# Patient Record
Sex: Male | Born: 1985 | Race: White | Hispanic: No | Marital: Single | Smoking: Never smoker
Health system: Southern US, Community
[De-identification: ages and names within clinical notes are randomized; demographics above are authoritative.]

## PROBLEM LIST (undated history)

## (undated) DIAGNOSIS — H3552 Pigmentary retinal dystrophy: Secondary | ICD-10-CM

## (undated) DIAGNOSIS — E063 Autoimmune thyroiditis: Secondary | ICD-10-CM

## (undated) DIAGNOSIS — Q897 Multiple congenital malformations, not elsewhere classified: Secondary | ICD-10-CM

## (undated) DIAGNOSIS — E039 Hypothyroidism, unspecified: Secondary | ICD-10-CM

## (undated) DIAGNOSIS — E049 Nontoxic goiter, unspecified: Secondary | ICD-10-CM

## (undated) DIAGNOSIS — E1121 Type 2 diabetes mellitus with diabetic nephropathy: Secondary | ICD-10-CM

## (undated) DIAGNOSIS — H543 Unqualified visual loss, both eyes: Secondary | ICD-10-CM

## (undated) DIAGNOSIS — R4189 Other symptoms and signs involving cognitive functions and awareness: Secondary | ICD-10-CM

## (undated) DIAGNOSIS — E11649 Type 2 diabetes mellitus with hypoglycemia without coma: Secondary | ICD-10-CM

## (undated) DIAGNOSIS — I10 Essential (primary) hypertension: Secondary | ICD-10-CM

## (undated) DIAGNOSIS — F79 Unspecified intellectual disabilities: Secondary | ICD-10-CM

## (undated) DIAGNOSIS — E1151 Type 2 diabetes mellitus with diabetic peripheral angiopathy without gangrene: Secondary | ICD-10-CM

## (undated) DIAGNOSIS — E1142 Type 2 diabetes mellitus with diabetic polyneuropathy: Secondary | ICD-10-CM

## (undated) DIAGNOSIS — I509 Heart failure, unspecified: Secondary | ICD-10-CM

## (undated) HISTORY — DX: Autoimmune thyroiditis: E06.3

## (undated) HISTORY — DX: Nontoxic goiter, unspecified: E04.9

## (undated) HISTORY — DX: Multiple congenital malformations, not elsewhere classified: Q89.7

## (undated) HISTORY — DX: Type 2 diabetes mellitus with diabetic peripheral angiopathy without gangrene: E11.51

## (undated) HISTORY — DX: Essential (primary) hypertension: I10

## (undated) HISTORY — DX: Unqualified visual loss, both eyes: H54.3

## (undated) HISTORY — DX: Type 2 diabetes mellitus with hypoglycemia without coma: E11.649

## (undated) HISTORY — DX: Type 2 diabetes mellitus with diabetic polyneuropathy: E11.42

## (undated) HISTORY — DX: Unspecified intellectual disabilities: F79

## (undated) HISTORY — PX: DENTAL SURGERY: SHX609

## (undated) NOTE — *Deleted (*Deleted)
Inpatient Diabetes Program Recommendations  AACE/ADA: New Consensus Statement on Inpatient Glycemic Control (2015)  Target Ranges:  Prepandial:   less than 140 mg/dL      Peak postprandial:   less than 180 mg/dL (1-2 hours)      Critically ill patients:  140 - 180 mg/dL   Lab Results  Component Value Date   GLUCAP 328 (H) 04/03/2020   HGBA1C 9.3 (H) 09/10/2019    Review of Glycemic Control  Results for [name], [name] (MRN [id number]) as of 04/03/2020 13:48  Ref. Range 04/03/2020 13:21  Glucose-Capillary Latest Ref Range: 70 - 99 mg/dL 956 (H)   Diabetes history: *** Outpatient Diabetes medications: *** Current orders for Inpatient glycemic control: ***  Inpatient Diabetes Program Recommendations:

---
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
## 2007-02-17 ENCOUNTER — Inpatient Hospital Stay (HOSPITAL_COMMUNITY): Admission: EM | Admit: 2007-02-17 | Discharge: 2007-02-19 | Payer: Self-pay | Admitting: Emergency Medicine

## 2007-02-17 ENCOUNTER — Ambulatory Visit: Payer: Self-pay | Admitting: Pediatrics

## 2007-02-17 ENCOUNTER — Ambulatory Visit: Payer: Self-pay | Admitting: Family Medicine

## 2007-03-29 ENCOUNTER — Ambulatory Visit: Payer: Self-pay | Admitting: "Endocrinology

## 2008-01-02 ENCOUNTER — Emergency Department (HOSPITAL_COMMUNITY): Admission: EM | Admit: 2008-01-02 | Discharge: 2008-01-02 | Payer: Self-pay | Admitting: Emergency Medicine

## 2008-02-05 ENCOUNTER — Ambulatory Visit: Payer: Self-pay | Admitting: "Endocrinology

## 2008-03-27 ENCOUNTER — Emergency Department (HOSPITAL_COMMUNITY): Admission: EM | Admit: 2008-03-27 | Discharge: 2008-03-27 | Payer: Self-pay | Admitting: Family Medicine

## 2008-06-11 ENCOUNTER — Ambulatory Visit: Payer: Self-pay | Admitting: "Endocrinology

## 2008-09-24 ENCOUNTER — Ambulatory Visit: Payer: Self-pay | Admitting: "Endocrinology

## 2009-07-28 ENCOUNTER — Emergency Department (HOSPITAL_COMMUNITY): Admission: EM | Admit: 2009-07-28 | Discharge: 2009-07-28 | Payer: Self-pay | Admitting: Emergency Medicine

## 2009-11-29 ENCOUNTER — Inpatient Hospital Stay (HOSPITAL_COMMUNITY): Admission: EM | Admit: 2009-11-29 | Discharge: 2009-12-01 | Payer: Self-pay | Admitting: Emergency Medicine

## 2009-12-04 ENCOUNTER — Ambulatory Visit: Payer: Self-pay | Admitting: "Endocrinology

## 2010-02-09 ENCOUNTER — Ambulatory Visit: Payer: Self-pay | Admitting: "Endocrinology

## 2010-02-10 ENCOUNTER — Emergency Department (HOSPITAL_COMMUNITY): Admission: EM | Admit: 2010-02-10 | Discharge: 2010-02-10 | Payer: Self-pay | Admitting: Emergency Medicine

## 2010-03-23 ENCOUNTER — Ambulatory Visit: Payer: Self-pay | Admitting: "Endocrinology

## 2010-05-26 ENCOUNTER — Ambulatory Visit
Admission: RE | Admit: 2010-05-26 | Discharge: 2010-05-26 | Payer: Self-pay | Source: Home / Self Care | Attending: "Endocrinology | Admitting: "Endocrinology

## 2010-08-06 LAB — POCT I-STAT, CHEM 8
BUN: 15 mg/dL (ref 6–23)
Calcium, Ion: 1.11 mmol/L — ABNORMAL LOW (ref 1.12–1.32)
Chloride: 101 mEq/L (ref 96–112)
Glucose, Bld: 200 mg/dL — ABNORMAL HIGH (ref 70–99)
HCT: 49 % (ref 39.0–52.0)
Hemoglobin: 16.7 g/dL (ref 13.0–17.0)
Potassium: 3.7 mEq/L (ref 3.5–5.1)
Sodium: 137 mEq/L (ref 135–145)
TCO2: 29 mmol/L (ref 0–100)

## 2010-08-06 LAB — GLUCOSE, CAPILLARY: Glucose-Capillary: 120 mg/dL — ABNORMAL HIGH (ref 70–99)

## 2010-08-09 LAB — TSH: TSH: 1.875 u[IU]/mL (ref 0.350–4.500)

## 2010-08-09 LAB — BASIC METABOLIC PANEL
BUN: 16 mg/dL (ref 6–23)
BUN: 5 mg/dL — ABNORMAL LOW (ref 6–23)
BUN: 6 mg/dL (ref 6–23)
CO2: 15 mEq/L — ABNORMAL LOW (ref 19–32)
CO2: 26 mEq/L (ref 19–32)
Calcium: 9.1 mg/dL (ref 8.4–10.5)
Chloride: 100 mEq/L (ref 96–112)
Chloride: 107 mEq/L (ref 96–112)
Chloride: 111 mEq/L (ref 96–112)
Creatinine, Ser: 0.68 mg/dL (ref 0.4–1.5)
Creatinine, Ser: 1.32 mg/dL (ref 0.4–1.5)
GFR calc Af Amer: >60 mL/min (ref >60)
GFR calc Af Amer: >60 mL/min (ref >60)
GFR calc non Af Amer: >60 mL/min (ref >60)
GFR calc non Af Amer: >60 mL/min (ref >60)
Glucose, Bld: 150 mg/dL — ABNORMAL HIGH (ref 70–99)
Glucose, Bld: 540 mg/dL — ABNORMAL HIGH (ref 70–99)
Glucose, Bld: 95 mg/dL (ref 70–99)
Potassium: 3 mEq/L — ABNORMAL LOW (ref 3.5–5.1)
Potassium: 3.4 mEq/L — ABNORMAL LOW (ref 3.5–5.1)
Potassium: 4.8 mEq/L (ref 3.5–5.1)
Potassium: 5 mEq/L (ref 3.5–5.1)
Sodium: 135 mEq/L (ref 135–145)
Sodium: 138 mEq/L (ref 135–145)

## 2010-08-09 LAB — GLUCOSE, CAPILLARY
Glucose-Capillary: 113 mg/dL — ABNORMAL HIGH (ref 70–99)
Glucose-Capillary: 122 mg/dL — ABNORMAL HIGH (ref 70–99)
Glucose-Capillary: 137 mg/dL — ABNORMAL HIGH (ref 70–99)
Glucose-Capillary: 149 mg/dL — ABNORMAL HIGH (ref 70–99)
Glucose-Capillary: 151 mg/dL — ABNORMAL HIGH (ref 70–99)
Glucose-Capillary: 151 mg/dL — ABNORMAL HIGH (ref 70–99)
Glucose-Capillary: 163 mg/dL — ABNORMAL HIGH (ref 70–99)
Glucose-Capillary: 170 mg/dL — ABNORMAL HIGH (ref 70–99)
Glucose-Capillary: 175 mg/dL — ABNORMAL HIGH (ref 70–99)
Glucose-Capillary: 175 mg/dL — ABNORMAL HIGH (ref 70–99)
Glucose-Capillary: 180 mg/dL — ABNORMAL HIGH (ref 70–99)
Glucose-Capillary: 180 mg/dL — ABNORMAL HIGH (ref 70–99)
Glucose-Capillary: 197 mg/dL — ABNORMAL HIGH (ref 70–99)
Glucose-Capillary: 204 mg/dL — ABNORMAL HIGH (ref 70–99)
Glucose-Capillary: 219 mg/dL — ABNORMAL HIGH (ref 70–99)
Glucose-Capillary: 220 mg/dL — ABNORMAL HIGH (ref 70–99)
Glucose-Capillary: 232 mg/dL — ABNORMAL HIGH (ref 70–99)
Glucose-Capillary: 238 mg/dL — ABNORMAL HIGH (ref 70–99)
Glucose-Capillary: 245 mg/dL — ABNORMAL HIGH (ref 70–99)
Glucose-Capillary: 392 mg/dL — ABNORMAL HIGH (ref 70–99)
Glucose-Capillary: 534 mg/dL — ABNORMAL HIGH (ref 70–99)
Glucose-Capillary: 92 mg/dL (ref 70–99)

## 2010-08-09 LAB — URINALYSIS, ROUTINE W REFLEX MICROSCOPIC
Bilirubin Urine: NEGATIVE
Glucose, UA: >1000 mg/dL — AB
Hgb urine dipstick: NEGATIVE
Ketones, ur: >80 mg/dL — AB
Leukocytes, UA: NEGATIVE
Nitrite: NEGATIVE
Protein, ur: NEGATIVE mg/dL
Specific Gravity, Urine: 1.031 — ABNORMAL HIGH (ref 1.005–1.030)
Urobilinogen, UA: 0.2 mg/dL (ref 0.0–1.0)
pH: 5 (ref 5.0–8.0)

## 2010-08-09 LAB — HEPATIC FUNCTION PANEL
ALT: 11 U/L (ref 0–53)
AST: 11 U/L (ref 0–37)
Albumin: 3.4 g/dL — ABNORMAL LOW (ref 3.5–5.2)
Alkaline Phosphatase: 77 U/L (ref 39–117)
Bilirubin, Direct: <0.1 mg/dL (ref 0.0–0.3)
Total Bilirubin: 0.9 mg/dL (ref 0.3–1.2)
Total Protein: 5.9 g/dL — ABNORMAL LOW (ref 6.0–8.3)

## 2010-08-09 LAB — PHOSPHORUS: Phosphorus: 2.3 mg/dL (ref 2.3–4.6)

## 2010-08-09 LAB — LIPASE, BLOOD: Lipase: 24 U/L (ref 11–59)

## 2010-08-09 LAB — HEMOGLOBIN A1C
Hgb A1c MFr Bld: 13.5 % — ABNORMAL HIGH (ref <5.7)
Mean Plasma Glucose: 341 mg/dL — ABNORMAL HIGH (ref <117)

## 2010-08-09 LAB — POCT I-STAT, CHEM 8
BUN: 17 mg/dL (ref 6–23)
Calcium, Ion: 1.18 mmol/L (ref 1.12–1.32)
Chloride: 105 mEq/L (ref 96–112)
Creatinine, Ser: 0.8 mg/dL (ref 0.4–1.5)
Glucose, Bld: 537 mg/dL — ABNORMAL HIGH (ref 70–99)
HCT: 52 % (ref 39.0–52.0)
Hemoglobin: 17.7 g/dL — ABNORMAL HIGH (ref 13.0–17.0)
Potassium: 5 mEq/L (ref 3.5–5.1)
Sodium: 134 mEq/L — ABNORMAL LOW (ref 135–145)
TCO2: 14 mmol/L (ref 0–100)

## 2010-08-09 LAB — CBC
HCT: 41.3 % (ref 39.0–52.0)
HCT: 46.2 % (ref 39.0–52.0)
Hemoglobin: 15.7 g/dL (ref 13.0–17.0)
MCH: 33.3 pg (ref 26.0–34.0)
MCHC: 34 g/dL (ref 30.0–36.0)
MCHC: 34.6 g/dL (ref 30.0–36.0)
MCV: 97.8 fL (ref 78.0–100.0)
Platelets: 178 10*3/uL (ref 150–400)
Platelets: 203 10*3/uL (ref 150–400)
RBC: 4.73 MIL/uL (ref 4.22–5.81)
RDW: 12.5 % (ref 11.5–15.5)
RDW: 12.6 % (ref 11.5–15.5)
WBC: 7.6 10*3/uL (ref 4.0–10.5)

## 2010-08-09 LAB — DIFFERENTIAL
Basophils Absolute: 0 10*3/uL (ref 0.0–0.1)
Basophils Relative: 0 % (ref 0–1)
Eosinophils Absolute: 0 10*3/uL (ref 0.0–0.7)
Eosinophils Relative: 0 % (ref 0–5)
Lymphocytes Relative: 22 % (ref 12–46)
Lymphs Abs: 1.6 10*3/uL (ref 0.7–4.0)
Monocytes Absolute: 0.3 10*3/uL (ref 0.1–1.0)
Monocytes Relative: 4 % (ref 3–12)
Neutro Abs: 5.5 10*3/uL (ref 1.7–7.7)
Neutrophils Relative %: 73 % (ref 43–77)

## 2010-08-09 LAB — URINE MICROSCOPIC-ADD ON: Urine-Other: NONE SEEN

## 2010-08-16 LAB — URINALYSIS, ROUTINE W REFLEX MICROSCOPIC
Ketones, ur: >80 mg/dL — AB
Leukocytes, UA: NEGATIVE
Nitrite: NEGATIVE
Specific Gravity, Urine: 1.041 — ABNORMAL HIGH (ref 1.005–1.030)
pH: 5.5 (ref 5.0–8.0)

## 2010-08-16 LAB — CBC
HCT: 45.6 % (ref 39.0–52.0)
MCHC: 34.8 g/dL (ref 30.0–36.0)
MCV: 96 fL (ref 78.0–100.0)
Platelets: 191 10*3/uL (ref 150–400)
RBC: 4.75 MIL/uL (ref 4.22–5.81)
WBC: 7.6 10*3/uL (ref 4.0–10.5)

## 2010-08-16 LAB — DIFFERENTIAL
Basophils Relative: 0 % (ref 0–1)
Eosinophils Absolute: 0 10*3/uL (ref 0.0–0.7)
Eosinophils Relative: 0 % (ref 0–5)
Lymphs Abs: 1.3 10*3/uL (ref 0.7–4.0)
Monocytes Relative: 7 % (ref 3–12)
Neutrophils Relative %: 75 % (ref 43–77)

## 2010-08-16 LAB — BASIC METABOLIC PANEL
Calcium: 9.1 mg/dL (ref 8.4–10.5)
GFR calc Af Amer: >60 mL/min (ref >60)
Glucose, Bld: 484 mg/dL — ABNORMAL HIGH (ref 70–99)
Sodium: 134 mEq/L — ABNORMAL LOW (ref 135–145)

## 2010-08-27 ENCOUNTER — Ambulatory Visit: Payer: Self-pay | Admitting: "Endocrinology

## 2010-10-06 NOTE — H&P (Signed)
NAMEJARRID, [name] NO.:  [id number]   MEDICAL RECORD NO.:  [id number]          PATIENT TYPE:  INP   LOCATION:  6157                         FACILITY:  [facility]   PHYSICIAN:  [name], M.D.DATE OF BIRTH:  03-05-1986   DATE OF ADMISSION:  02/17/2007  DATE OF DISCHARGE:                              HISTORY & PHYSICAL   PRIMARY CARE PHYSICIAN:  [facility] [place].   ENDOCRINOLOGIST:  Dr. Fransico [name].   CHIEF COMPLAINT:  Disoriented.   HISTORY OF PRESENT ILLNESS:  The patient is an 25 year old male with a  history of type 1 diabetes mellitus and hypothyroidism who presents from  home where he was confused and acting crazy.  Grandmother states that  he was not very responsive, told mom that he felt like he was going to  pass out, and he had slurred speech.  She considered calling 9-1-1, but  he improved and became more responsive without any intervention.  He was  searching for his morning insulin, which is strange because it was  sitting right in front of him, which went along with his increased  confusion.  He complained of abdominal pain and headache this morning  but was fine the night before and is fine currently in the emergency  department after receiving a liter of fluids.  He checked his sugar, and  it is in the 240s, and his urine was positive for ketones which he  checks regularly.  States he sometimes forgets to give himself insulin  and forgot last night.  He has felt weak but no nausea or vomiting,  fevers, chills, or sweats.  He denies current complaints.  His mouth is  not dry.  He is making tears, and he feels back to his baseline.  He was  given 1 liter of normal saline in the emergency department.   PAST MEDICAL HISTORY:  1. Type 1 diabetes mellitus.  2. Hypothyroidism.  3. Retinitis pigmentosa.   MEDICATIONS:  1. NovoLog 70/30, 26 units q.a.m. and 16 units nightly.  2. Humalog sliding-scale insulin before meals or food and nightly,  but      he does not do regular meal coverage or carbohydrate counting.  3. Synthroid.  Will bring the dosage of this medication later.   ALLERGIES:  No known drug allergies.   SOCIAL HISTORY:  Lives with his mom, dad, 7 year old sister.  He  graduates here from [employer].  No tobacco, alcohol, illicit  drugs.  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   FAMILY HISTORY:  Dad has rhinitis pigmentosa. Mom is living and healthy.  Sister is also living and healthy.   REVIEW OF SYSTEMS:  Denies chest pain, dyspnea, palpitations, dysuria,  hematuria, diarrhea.  Otherwise normal and per HPI.   PHYSICAL EXAMINATION:  VITAL SIGNS:  Temperature 97.1, pulse 100, blood  pressure 125/80, respirations 22, pulse oximetry 96% on room air.  GENERAL:  No acute distress, cooperative.  HEENT:  Atraumatic, extraocular movements intact.  Pupils equal, round  and reactive to light and making tears.  Tympanic membranes glossy, no  erythema.  No nasal discharge.  Pharynx:  Mucous membranes are moist. No  erythema or exudate.  NECK:  No lymphadenopathy, thyromegaly or masses.  CARDIOVASCULAR:  Regular rate and rhythm.  No murmurs, rubs or gallops.  LUNGS:  Clear to auscultation bilaterally.  No wheezes, rales or  rhonchi.  ABDOMEN:  Soft, nontender, nondistended.  No hepatosplenomegaly.  Bowel  sounds positive.  EXTREMITIES:  No edema.  Warm and well perfused.  NEUROLOGIC:  Alert and oriented x3.  Strength is 5/5 all extremities.  Muscle strength and reflexes are 3+ bilaterally in patellae and biceps.  Sensation is intact to light touch bilaterally.  Cranial nerves II-XII  grossly intact with the exception of his vision decrease.   LABORATORY DATA AND STUDIES:  Sodium 131, potassium 4.5, chloride 106,  bicarb 12.4, BUN 10, creatinine 0.7, glucose 253 with an anion gap of  13.  Urinalysis:  Specific gravity 1.042, greater than 1000 glucose,  greater than 80 ketones, moderate  bilirubin and 30 protein.   ASSESSMENT:  This is an 25 year old male with  1. Diabetic ketoacidosis.  His bicarb is 12 with multiple missed      insulin doses over the past week.  Discussed with Dr. Fransico [name], who      believes this is gastroenteritis with his abdominal pain and      nonadherence the likely cause even though he has relatively low      CBGs.  Will start D5 normal saline as glucose is less than 250.      Will give insulin for every 50 of a CBG above 200.  Would check      basic metabolic panels every 4 hours.  Will be n.p.o. until his      bicarb is greater than 20.  Will check CBGs q. 1 h with sliding      scale insulin as noted previously.  If no improvement in bicarb,      will broaden workup to include other etiologies of metabolic      acidosis.  2. Hypothyroidism.  Will have him check on the dose of Synthroid      [name] is on, and we will start this here.  3. Fluid, electrolytes, nutrition, gastrointestinal.  The patient will      be n.p.o.  We will monitor his potassium and other electrolytes.      Will continue D5 normal saline at 1-1/2 times maintenance.      [name], M.D.  Electronically Signed      [name], M.D.  Electronically Signed    SH/MEDQ  D:  02/17/2007  T:  02/18/2007  Job:  [id number]

## 2010-10-09 NOTE — Discharge Summary (Signed)
NAMECEDERIC, [name] NO.:  [id number]   MEDICAL RECORD NO.:  [id number]          PATIENT TYPE:  INP   LOCATION:  6153                         FACILITY:  [facility]   PHYSICIAN:  Wilhemina Bonito, M.D.     DATE OF BIRTH:  02-May-1986   DATE OF ADMISSION:  02/17/2007  DATE OF DISCHARGE:  02/19/2007                               DISCHARGE SUMMARY   DISCHARGE DIAGNOSES:  1. Moderate diabetic ketoacidosis.  2. Type 1 diabetic.  3. Hypothyroidism.  4. Retinosis pigmentosa.  5. Syndromic facies.   MEDICATIONS AT DISCHARGE:  1. Novolog 70/30 26 units in a.m., 16 units at night.  2. Humalog sliding scale insulin before meals; at breakfast, lunch and      dinner per Dr. Juluis Mire recommendations.  3. Synthroid 25 mcg p.o. daily.   FOLLOW UP:  The patient is to follow-up with Dr. Fransico Michael at his next  available appointment.  The patient to follow-up with the [facility], his PCP, by the end of this week on Friday to follow up for  improvement of diabetic blood glucose control.   DISCHARGE LABS:  Include urine ketones at 15.  Sodium 136, potassium  3.9, chloride 106, bicarb 25, glucose 260, BUN 4, creatinine 0.63,  calcium 8.4.  CBC:  White count 3.6, hemoglobin 14.4, hematocrit 41.4,  platelet count 171.  Hemoglobin A1c showed 11.7.   BRIEF HOSPITAL COURSE:  The patient is an 25 year old white male who is  followed by [facility], who was seen by Dr. Fransico Michael.  He was  admitted on January 17, 2007 for confusion, acting crazy and moderate  DKA.  Please see the dictated HPI dated on February 17, 2007.   The patient was admitted to the [facility]. and  was started per Dr. Juluis Mire recommendations on a sliding scale q.3 h.  with insulin, as well as IV fluid hydration.  The patient had a  deterioration in his bicarb and noticed to be in DKA; hence he was then  transferred over to the PICU.  Two-bag method was performed using normal  saline  with D10 and insulin drip, as well as normal saline with  potassium acetate.  The patient had resolution of his DKA on February 18, 2007, and was then transferred out of the PICU.  The patient was  transitioned over to his Novolog 70/30 home regimen, as well as his  sliding scale on the night of September 27;  and continued on the  September 28.  The patient had total resolution of his symptoms and no  nausea, no vomiting; tolerating p.o. and agreeable to go home.  The  patient will follow-up with [facility] as previously  scheduled, and will follow up with Dr. Fransico Michael as appointment is  available.  Sliding-scale regimen was provided for the patient per Dr.  Juluis Mire office.  Prescriptions were made for the patient for Novolog  70/30, as well as his Humalog sliding scale.      Wilhemina Bonito, M.D.  Electronically Signed     ML/MEDQ  D:  02/21/2007  T:  02/21/2007  Job:  [id number] 

## 2010-10-09 NOTE — H&P (Signed)
[name], [name] NO.:  [id number]   MEDICAL RECORD NO.:  [id number]          PATIENT TYPE:  INP   LOCATION:  6120                         FACILITY:  [facility]   PHYSICIAN:  Melina Fiddler, MD DATE OF BIRTH:  [birth year]   DATE OF ADMISSION:  10/02/2005  DATE OF DISCHARGE:                                HISTORY & PHYSICAL   CHIEF COMPLAINT:  Elevated blood glucose.   HISTORY OF PRESENT ILLNESS:  Mr. Herrman is a 25 year old male with history  of insulin-dependent diabetes mellitus since the age of 9 who had the onset  of nausea before lunch today.  No vomiting, no fever or chills.  He did have  associated throat pain.  CBG at the onset of nausea was 412.  Mother gave  the patient 15 units of old Humalog in the refrigerator, 15 to 20 minutes  later CBG was 377.  [facility]  CBG  at the facility was greater than 450.  Vitals were normal and stable.  Rapid  strep negative.  The patient was sent for direct admit for elevation of CBGs  and for further evaluation.   The patient was started on insulin (Humalog) pump a month ago per Dr. Reed Breech  at [facility].  The patient as well as mother admits that the patient  does not regularly check his CBGs.  He last saw Dr. Reed Breech in March of 2007  who had started seeing after leaving Dr. Tinnie Gens A. Todd's office at  [employer].  He was referred to [facility] about a year ago to see an  endocrinologist who they divorced.   REVIEW OF SYSTEMS:  No sick contacts.  GI negative.  Urologic:  Negative.  No chest pain and no shortness of breath.  No weight loss.  No upper  respiratory symptoms.   PAST MEDICAL HISTORY:  Insulin-dependent diabetes mellitus, diagnosed five  years ago.   MEDICATIONS:  Humalog insulin pop.  The patient nor mother know outpatient  orders (will bring for team to review).   ALLERGIES:  No known drug allergies.   FAMILY HISTORY:  Mother chronic  urinary tract infection.  Father described  as healthy as well as sister described as healthy.  Paternal grandmother  adult onset diabetes mellitus.  Maternal grandmother adult onset diabetes  mellitus.  Second cousin with juvenile diabetes.   SOCIAL HISTORY:  The patient lives with parents and 59 year old sister.  He  is in the 10th grade at [employer].  Denies tobacco abuse  as well as illicit drug use.  No alcohol use.  The patient denies sexual  activity.   PHYSICAL EXAMINATION:  HEENT:  Normocephalic and atraumatic.  PERRL.  EOMI.  No adenopathy.  No thyromegaly.  NECK:  Supple.  Oropharynx moist without lesions.  No pharyngeal edema or  erythema.  CARDIOVASCULAR:  Regular rate and rhythm.  No murmurs, gallops, or rubs.  LUNGS:  Clear to auscultation bilaterally.  ABDOMEN:  Soft, nontender, and nondistended.  Positive bowel sounds.  No  hepatosplenomegaly.  No masses.  EXTREMITIES:  No lower extremity edema.  Full range of motion.  No joint  swelling or erythema.  NEUROLOGICAL:  Cranial nerves II through XII intact.  SKIN:  Facial acne.   LABORATORY DATA:  At [place], WBC 5.9, hemoglobin 15.9, hematocrit 47.4,  platelets 182,000.  MCV 92.8.  Rapid strep negative.  Hemoglobin A1c 10.8 in  March of 2007.   ASSESSMENT/PLAN:  Insulin-dependent diabetes mellitus, poorly controlled.  No evidence of below knee amputation.  Presently, for clinical exam;  however, we will order electrolytes to assess for increased anion gap.  For  now, we will treat aggressively with IV fluids.  Hold insulin regimen for  now.  We will rule out source of infection as to the cause of elevated CBGs  with urinalysis and with repeat CBC.  The patient will likely need pediatric  endocrinologist referral to establish some consistency with his diabetes  management as well as to discuss a regimen that he is willing to be more  compliant with.      Morley Kos, M.D.     ______________________________  Melina Fiddler, MD    VRE/MEDQ  D:  10/03/2005  T:  10/03/2005  Job:  [id number]   cc:   Kinnie Scales. Reed Breech, M.D.  Fax: [phone]

## 2010-10-09 NOTE — Discharge Summary (Signed)
[place]  Patient:    [name], [name] Visit Number: [id number] MRN: [id number]          Service Type: PED Location: PEDS [phone] Attending Physician:  Evette Georges D. Dictated by:   Mont Dutton, M.D. Admit Date:  04/06/2001 Discharge Date: 04/11/2001   CC:         Mont Dutton, M.D.  Dr. Tawanna Cooler, Pediatrician.   Discharge Summary  ADMISSION DIAGNOSIS:  New onset diabetes.  DISCHARGE DIAGNOSIS:  New onset diabetes.  ATTENDING PHYSICIAN:  Dr. Evette Georges.  CONSULTATIONS:  Diabetic educator.  PROCEDURES:  None.  ADMISSION HISTORY:  The patient is a 25 year old white male with no significant past medical history who presented with new onset diabetes per primary doctor.  The patient had had a history of wetting his bed more frequently as well as getting up to urinate more times throughout the night. Also the patient had increased somnolence and falling asleep on the school bus.  At his primary physicians office his blood sugar was noted to be greater than 500 and he was sent to [facility].  ALLERGIES: No allergies.  MEDICATIONS:  No medications.  PAST MEDICAL HISTORY:  He was full-term complicated by a 14 day hospital stay for jaundice, however, no treatment requirement and no other problems.  No other hospitalizations or emergency room visits.  He has worn glasses since age 67 for retinitis pigmentosum.  He has had PE tubes bilaterally.  FAMILY HISTORY:  Diabetes in multiple family members including paternal grandmother, paternal grandfather, maternal great-grandmother, multiple cousins.  REVIEW OF SYSTEMS:  Significant for enuresis, increased somnolence.  PHYSICAL EXAMINATION:  VITAL SIGNS:  On admission physical the vital signs were noted to be within normal limits.  ABDOMEN:  Nontender.  LABORATORY:  Blood glucose was 459.  VBG had a pH 7.357, CO2 39.8, arterial O2 support is 3.1, bicarb  21.8, 80.9% saturation on room air.  CBC with white blood cell count 6.7, hemoglobin 14.4, hematocrit 41.6, platelet count 310K. Neutrophils 63%, 29% lymphs.  BMP sodium 136, potassium 4.0, chloride 100, bicarb 20, BUN 11, creatinine 0.7 with blood glucose 421. Calcium 9.1, magnesium 2.2, phosphorus 3.4.  Serum acetone: Small.  UA SG 1.040, glucose greater than 1000, ketones greater than 80, otherwise within normal limits.  HOSPITAL COURSE:  The patient was admitted for new onset diabetes mellitus not currently in diabetic ketoacidosis.  The patient was placed on normal saline bolus, 10 per kg times one, obtained C-peptide autocell antibody, antiGAD antibody, anti-insulin antibody, serum ketones, CBC with differential, BMET, phosphorus, magnesium, urinalysis, VBG and Accucheks.  The patient was started on an ADA diet and also placed on D5 half-normal saline with 20 K-Phos per liter at 100 cc per hour, once his glucose was less than 300.  The patient was placed on sliding scale Humalog insulin, the morning after admission placed on 8 units NPH insulin plus the Humalog sliding scale insulin.  On the day after admission he was on 8 units NPH insulin before breakfast, sliding scale insulin at lunch and 3 units NPH insulin and sliding scale insulin with dinner.  The sliding scale insulin was with meals only.  The patients blood sugars were noted to be in the 300s on multiple occasions.  On the second day of hospitalization he was changed to insulin 70/30, 14 units q.a.m., 7 units q.p.m. On the following day once again, based on his high blood sugar readings, his insulin was  increased to 16 units q.a.m., 9 units q.p.m. along with sliding scale insulin.  The following day of hospitalization the patient once again needed adjustment of his insulin dose.  The insulin 70/30 was increased to 18 units q.a.m. and 11 units q.p.m.  On 04/11/2001, which was day #5 of hospitalization, the patients final  insulin regimen was 18 units q.a.m. with breakfast, 13 units q.p.m. dinner along with a sliding scale of insulin for which for less than 65 was to give juice, 66-200 no units, 201-250 to give 1 unit, 251-300 to give 2 units, 301-350 to give 3 units, 351-400 to give 4 units, greater than 400 to give 5 units and call physician.  The patient had a nutrition consultation while in the hospital as well as teaching from the diabetic coordinator with a parent.  Patient was educated on use of insulin, when to check blood sugars and how to respond to the readings. The patient was stable throughout his hospitalization.  Blood sugars had improved.  He received diabetic teaching and was ready for discharge on day #5 of hospitalization.  The patient was given prescriptions for diabetic supplies, also for Humulin 70/30, 18 units before breakfast plus sliding scale, 13 units before dinner plus sliding scale insulin.  Patient was also given prescription for Humulin R for sliding scale insulin at breakfast, lunch and dinner with previous sliding scale insulin regimen.  ACTIVITY:  No restrictions.  DIET:  Diabetic diet as instructed.  WOUND CARE:  Not applicable.  FOLLOW UP:  Advanced home care nurse was set up to follow the patient for four days to assist with delivery of insulin and any other diabetic questions.  The patients other diabetic supplies were to be sent from [facility] and phone number was provided.  The patient was to follow up with Dr. Tawanna Cooler on Tuesday 04/11/2001, the day of discharge at 4:30 PM.  He is to bring a notebook with his blood sugar levels and glucometer to visit.  The patient is also to set up for an appointment with diabetes nutrition management center on Monday April 17, 2001 at 2:00 PM.  DISPOSITION: Patient is discharged to home with parents in stable condition. Dictated by:   Mont Dutton, M.D. Attending Physician:  Evette Georges D.  DD:  04/11/01 TD:   04/12/01 Job: [id number]

## 2010-10-09 NOTE — Discharge Summary (Signed)
NAMEMILON, DETHLOFF         ACCOUNT NO.:  [id number]   MEDICAL RECORD NO.:  [id number]          PATIENT TYPE:  INP   LOCATION:  5041                         FACILITY:  [facility]   PHYSICIAN:  Valetta Close, M.D.   DATE OF BIRTH:  02-Mar-1986   DATE OF ADMISSION:  06/17/2006  DATE OF DISCHARGE:  06/21/2006                               DISCHARGE SUMMARY   DISCHARGE DIAGNOSES:  1. Diabetic ketoacidosis.  2. Insulin-dependent diabetes mellitus.  3. Hypothyroidism.   DISCHARGE MEDICATIONS:  1. Novolin 70/30 twenty-five units injected once every morning with      breakfast and Novolin 70/30 fifteen units injected once every night      with dinner.  2. Synthroid 25 mcg once by mouth daily.   DISPOSITION AND FOLLOWUP:  Mr. Millon improved back to his baseline  with initiation of his Novolin therapy.  He is to follow up with Dr.  Fransico Michael at [facility].  He and his mother stated that they  would make that appointment on their own; they did not want any  assistance making the appointment.  They are also to follow up with  their primary care physician who they say that they are seeing at an  urgent care center and declined being seen at our outpatient clinic  though it was offered.  At the time of their followup, issues that need  to be addressed include his glucose control which can be obtained using  hemoglobin A1c, because we are unsure where his sugar control has been  prior to hospitalization.  It should also be reinforced how imperative  it is that he take his medications.  He was admitted essentially because  he had not been taking his medications secondary to lack of time.  Also,  he should be reeducated about his condition of hypothyroidism and why he  is taking his Synthroid.  When he was asked why he was taking his  Synthroid, he did not know, and when we told him he had hypothyroidism,  he acted as if it was new information.   PROCEDURES:  No procedures were  performed.   CONSULTATIONS:  No consultations were obtained.   BRIEF ADMITTING HISTORY AND PHYSICAL:  Mr. Venn is an 25 year old  male with a history of insulin-dependent diabetes mellitus, who was  diagnosed at the age of 29 and who had not been feeling well for a few  days, admitted to missing some of his Novolin doses because he did not  have time on his way to go to school, and while his parents administered  the Novolin for him when he started to not feel well, they did not give  him fluid to go along with it.  When he became disoriented and  lethargic, he was brought into the hospital.   VITAL SIGNS:  Temperature 97.7, blood pressure 121/82, pulse 106,  respiratory rate 20.  O2 sat 100% on room air.  GENERAL:  He was laying in bed in no apparent distress.  HEENT:  Eyes:  Pupils equal, round, and reactive to light and  accommodating full range of motion.  NECK:  Supple.  LUNGS:  Clear to auscultation.  CARDIOVASCULAR:  Regular rate and rhythm with no murmurs, rubs, or  gallops.  ABDOMEN:  His bowel sounds were positive but possibly mildly hypoactive.  He was nontender and nondistended.  EXTREMITIES:  No edema.  SKIN:  Within normal limits.  NEUROLOGIC:  Nonfocal and he had a flat affect but was alert and  oriented.   He had an ABG with a pH of 7.219, pCAO2 of 20.4, bicarb of 8.4.  Hemoglobin 15.9, hematocrit 46, white blood cell count 12, platelets  270, MCV 94.9.  Sodium 133, potassium 4.4, chloride 105, bicarb 12, BUN  14, creatinine 1.03, glucose 251, calcium 8.4.  D-dimer 1.44.  Serum  osmolality 305.   For more detailed history and physical please refer to the chart.   HOSPITAL COURSE:  1. Diabetic ketoacidosis.  He started on an insulin drip with      potassium repletion, made NPO with q. 2 h. BMETs and q. 1 h. CBGs      and this was continued until his anion gap closed and his bicarb      was above 20.  Again, requiring potassium throughout this course.       He was switched quickly to D5 half-normal saline as his glucose was      below 200 but his DKA was managed without difficulty and he was      able to be taken off the drip and started on first Lantus while      starting PO intake and then he was able to be transitioned to his      Novolin 70/30.  When he was more alert, he admitted that he had not      been taking his medications, but it was not because of a desire to      hurt himself or because he thought he did not need to take it.  It      was because he had time constraints and would be running late for      school and would not have time to take his medication, but he did      acknowledge that now he understands how imperative it is that he      does not miss taking any of his doses.  With his anion gap      improved, bicarb remaining stable with his taking PO intake and      doing well on his home medications, his DKA was taken care of.  2. Insulin-dependent diabetes mellitus, treated as above.  Maintain on      home medications.  3. Hypothyroidism.  A TSH and free T4 were checked.  Both were within      normal limits and, again, in good control with the Synthroid and he      was kept on his Synthroid at discharge.  4. Hypokalemia secondary to #1 and not unexpected, repleted, and his      potassium was at 3.5 on discharge and with him taking PO intake it      was decided that he did not need to be kept in the hospital.      However, he was given one dose of potassium 40 mEq before being      discharged.   DISCHARGE LABS AND VITALS:  Sodium 141, potassium 3.5, chloride 105,  bicarb 29, glucose 152, BUN 6, creatinine 0.49, calcium 8.9.  CBC on the  28th had a white blood cell count  of 3.7, hemoglobin of 13.6, hematocrit  of 39.4, platelet count of 138.  The blood cultures that were pending  that time of discharge but were negative to date did return on the 31st being negative.   Vital signs on discharge:  Temperature 98.1, pulse  76, respirations 18,  blood pressure 108/70.  O2 sat 99% on room air.      Valetta Close, M.D.  Electronically Signed     JC/MEDQ  D:  06/29/2006  T:  06/30/2006  Job:  [id number]   cc:   David Stall, M.D.

## 2010-10-09 NOTE — Discharge Summary (Signed)
[name], [name] NO.:  [id number]   MEDICAL RECORD NO.:  [id number]          PATIENT TYPE:  INP   LOCATION:  5022                         FACILITY:  [facility]   PHYSICIAN:  Melina Fiddler, MD DATE OF BIRTH:  02-15-1986   DATE OF ADMISSION:  10/02/2005  DATE OF DISCHARGE:  10/08/2005                                 DISCHARGE SUMMARY   DISCHARGE DIAGNOSIS:  1.  Type 1 diabetes, uncontrolled.  2.  Hyperglycemia.   DISCHARGE MEDICINES:  NovoLog 70/30, 26 units subcutaneously every morning  with meals, 14 units each evening as directed.   CONSULTANTS:  David Stall, M.D., pediatric endocrinology.   HOSPITAL COURSE:  1.  Diabetes:  Please see dictated H&P for more details.  The patient was      admitted from his primary care clinic over a concern of hyperglycemia.      The patient was admitted and placed on insulin sliding scale.  The      patient's sugars were well controlled during the hospital stay.      Duration of hospitalization was primarily due to the need for patient      education and establishment of a home regimen.  The patient came in with      insulin pump which was nonfunctional and empty of insulin.  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  Insulin pump was discontinued and the patient was      placed on two injection regimen with 70/30.  The patient had multiple      diabetic education sessions with diabetes educators.  The patient was      seen multiple times by Dr. Fransico Michael throughout the course of his stay      with regular insulin titrations.  The patient was discharged having      demonstrated understanding of his diabetes regimen alongside his mother      to the satisfaction of the diabetes educators.  It was determined that      the patient's mother would be the primary care [name] responsible for      managing his insulin administration during the day and  the patient would      have close follow-up with Dr. Reed Breech at [facility] and also      with Dr. Fransico Michael in Dr. Juluis Mire office.   FOLLOW UP:  1.  Dr. Reed Breech at [facility], please call for appointment.  2.  Dr. Fransico Michael, pediatric endocrinology, please call for appointment.      Towana Badger, M.D.    ______________________________  Melina Fiddler, MD    JP/MEDQ  D:  10/08/2005  T:  10/08/2005  Job:  [id number]

## 2010-11-09 ENCOUNTER — Encounter: Payer: Self-pay | Admitting: *Deleted

## 2010-11-09 DIAGNOSIS — E038 Other specified hypothyroidism: Secondary | ICD-10-CM

## 2010-11-09 DIAGNOSIS — E1065 Type 1 diabetes mellitus with hyperglycemia: Secondary | ICD-10-CM | POA: Insufficient documentation

## 2010-11-09 DIAGNOSIS — IMO0002 Reserved for concepts with insufficient information to code with codable children: Secondary | ICD-10-CM | POA: Insufficient documentation

## 2010-11-09 DIAGNOSIS — I10 Essential (primary) hypertension: Secondary | ICD-10-CM | POA: Insufficient documentation

## 2010-11-21 ENCOUNTER — Emergency Department (HOSPITAL_COMMUNITY): Payer: Medicaid Other

## 2010-11-21 ENCOUNTER — Emergency Department (HOSPITAL_COMMUNITY)
Admission: EM | Admit: 2010-11-21 | Discharge: 2010-11-21 | Disposition: A | Discharge status: Home / Self Care | Payer: Medicaid Other | Attending: Emergency Medicine | Admitting: Emergency Medicine

## 2010-11-21 DIAGNOSIS — E119 Type 2 diabetes mellitus without complications: Secondary | ICD-10-CM | POA: Insufficient documentation

## 2010-11-21 DIAGNOSIS — M25519 Pain in unspecified shoulder: Secondary | ICD-10-CM | POA: Insufficient documentation

## 2010-11-21 DIAGNOSIS — Z79899 Other long term (current) drug therapy: Secondary | ICD-10-CM | POA: Insufficient documentation

## 2010-11-21 DIAGNOSIS — S0180XA Unspecified open wound of other part of head, initial encounter: Secondary | ICD-10-CM | POA: Insufficient documentation

## 2010-11-21 DIAGNOSIS — H543 Unqualified visual loss, both eyes: Secondary | ICD-10-CM | POA: Insufficient documentation

## 2010-11-21 DIAGNOSIS — M25539 Pain in unspecified wrist: Secondary | ICD-10-CM | POA: Insufficient documentation

## 2010-11-21 DIAGNOSIS — S060X1A Concussion with loss of consciousness of 30 minutes or less, initial encounter: Secondary | ICD-10-CM | POA: Insufficient documentation

## 2010-11-21 DIAGNOSIS — R404 Transient alteration of awareness: Secondary | ICD-10-CM | POA: Insufficient documentation

## 2010-11-21 DIAGNOSIS — S52599A Other fractures of lower end of unspecified radius, initial encounter for closed fracture: Secondary | ICD-10-CM | POA: Insufficient documentation

## 2010-11-21 DIAGNOSIS — R55 Syncope and collapse: Secondary | ICD-10-CM | POA: Insufficient documentation

## 2010-11-21 DIAGNOSIS — S42013A Anterior displaced fracture of sternal end of unspecified clavicle, initial encounter for closed fracture: Secondary | ICD-10-CM | POA: Insufficient documentation

## 2010-11-21 DIAGNOSIS — E039 Hypothyroidism, unspecified: Secondary | ICD-10-CM | POA: Insufficient documentation

## 2010-11-21 DIAGNOSIS — F79 Unspecified intellectual disabilities: Secondary | ICD-10-CM | POA: Insufficient documentation

## 2010-11-24 ENCOUNTER — Other Ambulatory Visit (HOSPITAL_COMMUNITY): Payer: Self-pay | Admitting: Specialist

## 2010-11-24 DIAGNOSIS — S62102A Fracture of unspecified carpal bone, left wrist, initial encounter for closed fracture: Secondary | ICD-10-CM

## 2010-11-30 ENCOUNTER — Other Ambulatory Visit (HOSPITAL_COMMUNITY): Payer: Self-pay | Admitting: Specialist

## 2010-11-30 DIAGNOSIS — S62102A Fracture of unspecified carpal bone, left wrist, initial encounter for closed fracture: Secondary | ICD-10-CM

## 2010-12-01 ENCOUNTER — Other Ambulatory Visit (HOSPITAL_COMMUNITY): Payer: Medicare Other

## 2010-12-01 ENCOUNTER — Inpatient Hospital Stay (HOSPITAL_COMMUNITY): Admission: RE | Admit: 2010-12-01 | Payer: Medicare Other | Source: Ambulatory Visit

## 2010-12-01 ENCOUNTER — Ambulatory Visit (HOSPITAL_COMMUNITY)
Admission: RE | Admit: 2010-12-01 | Discharge: 2010-12-01 | Disposition: A | Discharge status: Home / Self Care | Payer: Medicaid Other | Source: Ambulatory Visit | Attending: Specialist | Admitting: Specialist

## 2010-12-01 DIAGNOSIS — S62102A Fracture of unspecified carpal bone, left wrist, initial encounter for closed fracture: Secondary | ICD-10-CM

## 2010-12-01 DIAGNOSIS — W19XXXA Unspecified fall, initial encounter: Secondary | ICD-10-CM | POA: Insufficient documentation

## 2010-12-01 DIAGNOSIS — S52539A Colles' fracture of unspecified radius, initial encounter for closed fracture: Secondary | ICD-10-CM | POA: Insufficient documentation

## 2010-12-10 ENCOUNTER — Ambulatory Visit (INDEPENDENT_AMBULATORY_CARE_PROVIDER_SITE_OTHER): Payer: Medicaid Other | Admitting: "Endocrinology

## 2010-12-10 VITALS — BP 125/84 | HR 73 | Wt 120.0 lb

## 2010-12-10 DIAGNOSIS — E049 Nontoxic goiter, unspecified: Secondary | ICD-10-CM

## 2010-12-10 DIAGNOSIS — E1151 Type 2 diabetes mellitus with diabetic peripheral angiopathy without gangrene: Secondary | ICD-10-CM

## 2010-12-10 DIAGNOSIS — E1159 Type 2 diabetes mellitus with other circulatory complications: Secondary | ICD-10-CM

## 2010-12-10 DIAGNOSIS — IMO0002 Reserved for concepts with insufficient information to code with codable children: Secondary | ICD-10-CM

## 2010-12-10 DIAGNOSIS — E1149 Type 2 diabetes mellitus with other diabetic neurological complication: Secondary | ICD-10-CM

## 2010-12-10 DIAGNOSIS — G609 Hereditary and idiopathic neuropathy, unspecified: Secondary | ICD-10-CM

## 2010-12-10 DIAGNOSIS — I10 Essential (primary) hypertension: Secondary | ICD-10-CM

## 2010-12-10 DIAGNOSIS — I798 Other disorders of arteries, arterioles and capillaries in diseases classified elsewhere: Secondary | ICD-10-CM

## 2010-12-10 DIAGNOSIS — E162 Hypoglycemia, unspecified: Secondary | ICD-10-CM

## 2010-12-10 DIAGNOSIS — E1142 Type 2 diabetes mellitus with diabetic polyneuropathy: Secondary | ICD-10-CM

## 2010-12-10 DIAGNOSIS — E1065 Type 1 diabetes mellitus with hyperglycemia: Secondary | ICD-10-CM

## 2010-12-10 LAB — T3, FREE: T3, Free: 2 pg/mL — ABNORMAL LOW (ref 2.3–4.2)

## 2010-12-10 LAB — COMPREHENSIVE METABOLIC PANEL
BUN: 11 mg/dL (ref 6–23)
CO2: 26 mEq/L (ref 19–32)
Calcium: 9.2 mg/dL (ref 8.4–10.5)
Chloride: 99 mEq/L (ref 96–112)
Creat: 0.74 mg/dL (ref 0.50–1.35)
Total Bilirubin: 0.5 mg/dL (ref 0.3–1.2)

## 2010-12-10 LAB — LIPID PANEL
Cholesterol: 178 mg/dL (ref 0–200)
Triglycerides: 123 mg/dL (ref <150)

## 2010-12-10 LAB — TSH: TSH: 2.112 u[IU]/mL (ref 0.350–4.500)

## 2010-12-10 LAB — T4, FREE: Free T4: 1.25 ng/dL (ref 0.80–1.80)

## 2010-12-10 NOTE — Patient Instructions (Signed)
Follow-up visit in 3 months. Continue to actively supervise [name]' diabetes care.

## 2010-12-11 LAB — MICROALBUMIN / CREATININE URINE RATIO
Creatinine, Urine: 33.3 mg/dL
Microalb Creat Ratio: 15 mg/g (ref 0.0–30.0)

## 2011-01-02 ENCOUNTER — Emergency Department (HOSPITAL_COMMUNITY)
Admission: EM | Admit: 2011-01-02 | Discharge: 2011-01-02 | Disposition: A | Discharge status: Home / Self Care | Payer: Medicaid Other | Attending: Emergency Medicine | Admitting: Emergency Medicine

## 2011-01-02 DIAGNOSIS — H543 Unqualified visual loss, both eyes: Secondary | ICD-10-CM | POA: Insufficient documentation

## 2011-01-02 DIAGNOSIS — R51 Headache: Secondary | ICD-10-CM | POA: Insufficient documentation

## 2011-01-02 DIAGNOSIS — E860 Dehydration: Secondary | ICD-10-CM | POA: Insufficient documentation

## 2011-01-02 DIAGNOSIS — E119 Type 2 diabetes mellitus without complications: Secondary | ICD-10-CM | POA: Insufficient documentation

## 2011-01-02 DIAGNOSIS — F79 Unspecified intellectual disabilities: Secondary | ICD-10-CM | POA: Insufficient documentation

## 2011-01-02 DIAGNOSIS — R55 Syncope and collapse: Secondary | ICD-10-CM | POA: Insufficient documentation

## 2011-01-02 LAB — URINE MICROSCOPIC-ADD ON

## 2011-01-02 LAB — DIFFERENTIAL
Basophils Absolute: 0 10*3/uL (ref 0.0–0.1)
Basophils Relative: 0 % (ref 0–1)
Eosinophils Absolute: 0 10*3/uL (ref 0.0–0.7)
Monocytes Absolute: 0.4 10*3/uL (ref 0.1–1.0)
Monocytes Relative: 5 % (ref 3–12)

## 2011-01-02 LAB — CBC
MCH: 32.9 pg (ref 26.0–34.0)
MCHC: 35.8 g/dL (ref 30.0–36.0)
Platelets: 223 10*3/uL (ref 150–400)
RDW: 12.7 % (ref 11.5–15.5)

## 2011-01-02 LAB — URINALYSIS, ROUTINE W REFLEX MICROSCOPIC
Hgb urine dipstick: NEGATIVE
Leukocytes, UA: NEGATIVE
Nitrite: NEGATIVE
Protein, ur: 100 mg/dL — AB
Urobilinogen, UA: 0.2 mg/dL (ref 0.0–1.0)

## 2011-01-02 LAB — BASIC METABOLIC PANEL
Calcium: 10.2 mg/dL (ref 8.4–10.5)
GFR calc Af Amer: >60 mL/min (ref >60)
GFR calc non Af Amer: >60 mL/min (ref >60)
Sodium: 134 mEq/L — ABNORMAL LOW (ref 135–145)

## 2011-02-19 LAB — GLUCOSE, CAPILLARY
Glucose-Capillary: 131 — ABNORMAL HIGH
Glucose-Capillary: 136 — ABNORMAL HIGH
Glucose-Capillary: 238 — ABNORMAL HIGH
Glucose-Capillary: 402 — ABNORMAL HIGH
Glucose-Capillary: 52 — ABNORMAL LOW

## 2011-02-19 LAB — URINALYSIS, ROUTINE W REFLEX MICROSCOPIC
Bilirubin Urine: NEGATIVE
Glucose, UA: >1000 — AB
Hgb urine dipstick: NEGATIVE
Ketones, ur: 15 — AB
Protein, ur: NEGATIVE

## 2011-02-19 LAB — BASIC METABOLIC PANEL WITH GFR
Calcium: 9.1
GFR calc Af Amer: >60
GFR calc non Af Amer: >60
Glucose, Bld: 354 — ABNORMAL HIGH
Potassium: 3.8
Sodium: 134 — ABNORMAL LOW

## 2011-02-19 LAB — DIFFERENTIAL
Basophils Absolute: 0
Eosinophils Relative: 1
Lymphocytes Relative: 28
Monocytes Absolute: 0.5

## 2011-02-19 LAB — POCT I-STAT, CHEM 8
BUN: 15
Calcium, Ion: 1.24
Chloride: 102
Creatinine, Ser: 0.8
Glucose, Bld: 359 — ABNORMAL HIGH
HCT: 45
Hemoglobin: 15.3
Potassium: 3.6
Sodium: 136
TCO2: 25

## 2011-02-19 LAB — POCT I-STAT 3, VENOUS BLOOD GAS (G3P V)
Acid-Base Excess: 1
Bicarbonate: 26.5 — ABNORMAL HIGH
O2 Saturation: 69
Patient temperature: 98.6
TCO2: 28
pCO2, Ven: 45.4
pH, Ven: 7.374 — ABNORMAL HIGH
pO2, Ven: 37

## 2011-02-19 LAB — CBC
HCT: 44.2
Hemoglobin: 15.4
RDW: 12.9

## 2011-02-19 LAB — BASIC METABOLIC PANEL
BUN: 13
CO2: 26
Chloride: 102
Creatinine, Ser: 0.8

## 2011-03-04 LAB — I-STAT 8, (EC8 V) (CONVERTED LAB)
Acid-base deficit: 10 — ABNORMAL HIGH
Acid-base deficit: 13 — ABNORMAL HIGH
Acid-base deficit: 2
Acid-base deficit: 5 — ABNORMAL HIGH
BUN: 10
BUN: 4 — ABNORMAL LOW
BUN: 4 — ABNORMAL LOW
BUN: 5 — ABNORMAL LOW
Bicarbonate: 12.4 — ABNORMAL LOW
Bicarbonate: 15.5 — ABNORMAL LOW
Bicarbonate: 19.6 — ABNORMAL LOW
Bicarbonate: 21.3
Chloride: 106
Chloride: 107
Chloride: 108
Chloride: 109
Glucose, Bld: 202 — ABNORMAL HIGH
Glucose, Bld: 222 — ABNORMAL HIGH
Glucose, Bld: 253 — ABNORMAL HIGH
Glucose, Bld: 258 — ABNORMAL HIGH
HCT: 43
HCT: 44
HCT: 49
HCT: 56 — ABNORMAL HIGH
Hemoglobin: 14.6
Hemoglobin: 15
Hemoglobin: 16.7 — ABNORMAL HIGH
Hemoglobin: 19 — ABNORMAL HIGH
Operator id: 212021
Operator id: 212021
Operator id: 234501
Operator id: 286091
Potassium: 3.3 — ABNORMAL LOW
Potassium: 3.6
Potassium: 3.8
Potassium: 4.5
Sodium: 131 — ABNORMAL LOW
Sodium: 140
Sodium: 142
Sodium: 143
TCO2: 13
TCO2: 17
TCO2: 21
TCO2: 22
pCO2, Ven: 28.7 — ABNORMAL LOW
pCO2, Ven: 32.9 — ABNORMAL LOW
pCO2, Ven: 33 — ABNORMAL LOW
pCO2, Ven: 34.9 — ABNORMAL LOW
pH, Ven: 7.243 — ABNORMAL LOW
pH, Ven: 7.282
pH, Ven: 7.357 — ABNORMAL HIGH
pH, Ven: 7.418 — ABNORMAL HIGH

## 2011-03-04 LAB — POCT I-STAT EG7
Acid-base deficit: 13 — ABNORMAL HIGH
Acid-base deficit: 7 — ABNORMAL HIGH
Bicarbonate: 12.7 — ABNORMAL LOW
Bicarbonate: 16.8 — ABNORMAL LOW
Calcium, Ion: 1.22
Calcium, Ion: 1.26
HCT: 44
HCT: 47
Hemoglobin: 15
Hemoglobin: 16
O2 Saturation: 94
O2 Saturation: 95
Operator id: 212021
Operator id: 286091
Patient temperature: 36.4
Patient temperature: 39
Potassium: 3.4 — ABNORMAL LOW
Potassium: 3.5
Sodium: 138
Sodium: 142
TCO2: 14
TCO2: 18
pCO2, Ven: 29.6 — ABNORMAL LOW
pCO2, Ven: 32.9 — ABNORMAL LOW
pH, Ven: 7.238 — ABNORMAL LOW
pH, Ven: 7.326 — ABNORMAL HIGH
pO2, Ven: 83 — ABNORMAL HIGH
pO2, Ven: 87 — ABNORMAL HIGH

## 2011-03-04 LAB — CBC
HCT: 41.4
HCT: 46.3
Hemoglobin: 15.9
MCV: 94.5
RBC: 4.38
RBC: 4.9
WBC: 3.6 — ABNORMAL LOW
WBC: 9.8

## 2011-03-04 LAB — BASIC METABOLIC PANEL WITH GFR
CO2: 17 — ABNORMAL LOW
Calcium: 8.1 — ABNORMAL LOW
Creatinine, Ser: 0.78
GFR calc Af Amer: >60
GFR calc non Af Amer: >60
Glucose, Bld: 262 — ABNORMAL HIGH

## 2011-03-04 LAB — COMPREHENSIVE METABOLIC PANEL
ALT: 10
AST: 16
Albumin: 4.3
CO2: 16 — ABNORMAL LOW
Calcium: 9.2
Creatinine, Ser: 0.97
GFR calc Af Amer: >60
GFR calc non Af Amer: >60
Sodium: 131 — ABNORMAL LOW
Total Protein: 7.5

## 2011-03-04 LAB — BASIC METABOLIC PANEL
BUN: 4 — ABNORMAL LOW
BUN: 5 — ABNORMAL LOW
BUN: 6
CO2: 16 — ABNORMAL LOW
CO2: 19
Calcium: 8.2 — ABNORMAL LOW
Calcium: 8.6
Calcium: 8.7
Calcium: 8.8
Chloride: 106
Chloride: 108
Chloride: 109
Chloride: 111
Creatinine, Ser: 0.69
Creatinine, Ser: 0.92
Creatinine, Ser: 0.92
GFR calc Af Amer: >60
GFR calc Af Amer: >60
GFR calc Af Amer: >60
GFR calc Af Amer: >60
GFR calc Af Amer: >60
GFR calc non Af Amer: >60
GFR calc non Af Amer: >60
GFR calc non Af Amer: >60
GFR calc non Af Amer: >60
GFR calc non Af Amer: >60
Glucose, Bld: 219 — ABNORMAL HIGH
Glucose, Bld: 245 — ABNORMAL HIGH
Glucose, Bld: 258 — ABNORMAL HIGH
Potassium: 3.2 — ABNORMAL LOW
Potassium: 3.2 — ABNORMAL LOW
Potassium: 3.3 — ABNORMAL LOW
Potassium: 3.4 — ABNORMAL LOW
Potassium: 3.9
Potassium: 5.6 — ABNORMAL HIGH
Sodium: 132 — ABNORMAL LOW
Sodium: 132 — ABNORMAL LOW
Sodium: 135
Sodium: 136
Sodium: 136
Sodium: 139
Sodium: 140

## 2011-03-04 LAB — KETONES, URINE
Ketones, ur: >80 — AB
Ketones, ur: >80 — AB
Ketones, ur: >80 — AB
Ketones, ur: >80 — AB

## 2011-03-04 LAB — URINALYSIS, ROUTINE W REFLEX MICROSCOPIC
Glucose, UA: >1000 — AB
Hgb urine dipstick: NEGATIVE
Specific Gravity, Urine: 1.043 — ABNORMAL HIGH
Urobilinogen, UA: 1

## 2011-03-04 LAB — BETA-HYDROXYBUTYRIC ACID: Beta-Hydroxybutyric Acid: 57.4 mg/dL — ABNORMAL HIGH (ref 0.0–3.0)

## 2011-03-04 LAB — POCT I-STAT CREATININE
Creatinine, Ser: 0.7
Operator id: 234501

## 2011-03-04 LAB — TSH: TSH: 3.85

## 2011-03-04 LAB — DIFFERENTIAL
Basophils Absolute: 0
Eosinophils Relative: 1
Lymphocytes Relative: 6 — ABNORMAL LOW
Lymphs Abs: 0.6 — ABNORMAL LOW
Monocytes Absolute: 0.7
Neutro Abs: 7.9 — ABNORMAL HIGH

## 2011-03-04 LAB — POTASSIUM: Potassium: 3.5

## 2011-03-04 LAB — URINE MICROSCOPIC-ADD ON

## 2011-03-16 ENCOUNTER — Ambulatory Visit (INDEPENDENT_AMBULATORY_CARE_PROVIDER_SITE_OTHER): Payer: Medicaid Other | Admitting: "Endocrinology

## 2011-03-16 VITALS — BP 115/81 | HR 88 | Wt 118.2 lb

## 2011-03-16 DIAGNOSIS — E1065 Type 1 diabetes mellitus with hyperglycemia: Secondary | ICD-10-CM

## 2011-03-16 DIAGNOSIS — F079 Unspecified personality and behavioral disorder due to known physiological condition: Secondary | ICD-10-CM

## 2011-03-16 DIAGNOSIS — IMO0002 Reserved for concepts with insufficient information to code with codable children: Secondary | ICD-10-CM

## 2011-03-16 DIAGNOSIS — I10 Essential (primary) hypertension: Secondary | ICD-10-CM

## 2011-03-16 DIAGNOSIS — Z91199 Patient's noncompliance with other medical treatment and regimen due to unspecified reason: Secondary | ICD-10-CM

## 2011-03-16 DIAGNOSIS — F09 Unspecified mental disorder due to known physiological condition: Secondary | ICD-10-CM

## 2011-03-16 DIAGNOSIS — Z9119 Patient's noncompliance with other medical treatment and regimen: Secondary | ICD-10-CM

## 2011-03-16 DIAGNOSIS — E109 Type 1 diabetes mellitus without complications: Secondary | ICD-10-CM

## 2011-03-16 DIAGNOSIS — E038 Other specified hypothyroidism: Secondary | ICD-10-CM

## 2011-03-16 DIAGNOSIS — G629 Polyneuropathy, unspecified: Secondary | ICD-10-CM

## 2011-03-16 DIAGNOSIS — E063 Autoimmune thyroiditis: Secondary | ICD-10-CM

## 2011-03-16 DIAGNOSIS — G609 Hereditary and idiopathic neuropathy, unspecified: Secondary | ICD-10-CM

## 2011-03-16 NOTE — Patient Instructions (Signed)
Followup visit with me in 2 months. Please call me in 2 weeks on either Wednesday night or Sunday night between 8:30 and 10 PM to discuss blood sugars. Please ensure that [name] gets the 70/30 insulin at breakfast and at supper whenever those meals happen to occur. Please try to make sure that he gets his Synthroid and lisinopril ever day. Please obtain a primary care provider for [facility]. Please let me know the name and phone number for that primary care provider. [name] will need a referral to neurology and probably also to psychiatry.

## 2011-04-30 ENCOUNTER — Encounter: Payer: Self-pay | Admitting: *Deleted

## 2011-04-30 ENCOUNTER — Inpatient Hospital Stay (HOSPITAL_COMMUNITY): Payer: Medicare Other

## 2011-04-30 ENCOUNTER — Inpatient Hospital Stay (HOSPITAL_COMMUNITY)
Admission: EM | Admit: 2011-04-30 | Discharge: 2011-05-02 | DRG: 639 | Disposition: A | Discharge status: Home / Self Care | Payer: Medicare Other | Attending: Internal Medicine | Admitting: Internal Medicine

## 2011-04-30 DIAGNOSIS — H3552 Pigmentary retinal dystrophy: Secondary | ICD-10-CM | POA: Diagnosis present

## 2011-04-30 DIAGNOSIS — J069 Acute upper respiratory infection, unspecified: Secondary | ICD-10-CM

## 2011-04-30 DIAGNOSIS — E039 Hypothyroidism, unspecified: Secondary | ICD-10-CM | POA: Diagnosis present

## 2011-04-30 DIAGNOSIS — Z9119 Patient's noncompliance with other medical treatment and regimen: Secondary | ICD-10-CM

## 2011-04-30 DIAGNOSIS — N058 Unspecified nephritic syndrome with other morphologic changes: Secondary | ICD-10-CM | POA: Diagnosis present

## 2011-04-30 DIAGNOSIS — IMO0002 Reserved for concepts with insufficient information to code with codable children: Secondary | ICD-10-CM | POA: Insufficient documentation

## 2011-04-30 DIAGNOSIS — E1010 Type 1 diabetes mellitus with ketoacidosis without coma: Principal | ICD-10-CM | POA: Diagnosis present

## 2011-04-30 DIAGNOSIS — E1065 Type 1 diabetes mellitus with hyperglycemia: Secondary | ICD-10-CM | POA: Diagnosis present

## 2011-04-30 DIAGNOSIS — E860 Dehydration: Secondary | ICD-10-CM

## 2011-04-30 DIAGNOSIS — Z794 Long term (current) use of insulin: Secondary | ICD-10-CM

## 2011-04-30 DIAGNOSIS — B9789 Other viral agents as the cause of diseases classified elsewhere: Secondary | ICD-10-CM | POA: Diagnosis present

## 2011-04-30 DIAGNOSIS — J029 Acute pharyngitis, unspecified: Secondary | ICD-10-CM | POA: Diagnosis present

## 2011-04-30 DIAGNOSIS — Z91199 Patient's noncompliance with other medical treatment and regimen due to unspecified reason: Secondary | ICD-10-CM

## 2011-04-30 DIAGNOSIS — E1110 Type 2 diabetes mellitus with ketoacidosis without coma: Secondary | ICD-10-CM

## 2011-04-30 DIAGNOSIS — E1029 Type 1 diabetes mellitus with other diabetic kidney complication: Secondary | ICD-10-CM | POA: Diagnosis present

## 2011-04-30 DIAGNOSIS — F79 Unspecified intellectual disabilities: Secondary | ICD-10-CM | POA: Diagnosis present

## 2011-04-30 DIAGNOSIS — Z79899 Other long term (current) drug therapy: Secondary | ICD-10-CM

## 2011-04-30 HISTORY — DX: Other symptoms and signs involving cognitive functions and awareness: R41.89

## 2011-04-30 HISTORY — DX: Hypothyroidism, unspecified: E03.9

## 2011-04-30 HISTORY — DX: Pigmentary retinal dystrophy: H35.52

## 2011-04-30 HISTORY — DX: Type 2 diabetes mellitus with diabetic nephropathy: E11.21

## 2011-04-30 LAB — POCT I-STAT 3, VENOUS BLOOD GAS (G3P V)
Bicarbonate: 10.5 mEq/L — ABNORMAL LOW (ref 20.0–24.0)
TCO2: 11 mmol/L (ref 0–100)
pCO2, Ven: 28.7 mmHg — ABNORMAL LOW (ref 45.0–50.0)
pH, Ven: 7.173 — CL (ref 7.250–7.300)

## 2011-04-30 LAB — BASIC METABOLIC PANEL
BUN: 8 mg/dL (ref 6–23)
CO2: 18 mEq/L — ABNORMAL LOW (ref 19–32)
Calcium: 8.2 mg/dL — ABNORMAL LOW (ref 8.4–10.5)
Calcium: 8.2 mg/dL — ABNORMAL LOW (ref 8.4–10.5)
Chloride: 101 mEq/L (ref 96–112)
Chloride: 108 mEq/L (ref 96–112)
Chloride: 111 mEq/L (ref 96–112)
Chloride: 106 mEq/L (ref 96–112)
Creatinine, Ser: 0.73 mg/dL (ref 0.50–1.35)
Creatinine, Ser: 0.61 mg/dL (ref 0.50–1.35)
Creatinine, Ser: 0.54 mg/dL (ref 0.50–1.35)
GFR calc Af Amer: >90 mL/min (ref >90)
GFR calc Af Amer: >90 mL/min (ref >90)
GFR calc Af Amer: >90 mL/min (ref >90)
GFR calc Af Amer: >90 mL/min (ref >90)
GFR calc non Af Amer: >90 mL/min (ref >90)
Potassium: 4.4 mEq/L (ref 3.5–5.1)
Potassium: 3.4 mEq/L — ABNORMAL LOW (ref 3.5–5.1)
Sodium: 133 mEq/L — ABNORMAL LOW (ref 135–145)
Sodium: 137 mEq/L (ref 135–145)
Sodium: 132 mEq/L — ABNORMAL LOW (ref 135–145)

## 2011-04-30 LAB — GLUCOSE, CAPILLARY
Glucose-Capillary: 263 mg/dL — ABNORMAL HIGH (ref 70–99)
Glucose-Capillary: 164 mg/dL — ABNORMAL HIGH (ref 70–99)
Glucose-Capillary: 119 mg/dL — ABNORMAL HIGH (ref 70–99)
Glucose-Capillary: 136 mg/dL — ABNORMAL HIGH (ref 70–99)

## 2011-04-30 LAB — CBC
HCT: 43.4 % (ref 39.0–52.0)
Hemoglobin: 15.7 g/dL (ref 13.0–17.0)
MCH: 32.9 pg (ref 26.0–34.0)
MCHC: 36.2 g/dL — ABNORMAL HIGH (ref 30.0–36.0)
MCV: 91 fL (ref 78.0–100.0)
WBC: 6.3 10*3/uL (ref 4.0–10.5)

## 2011-04-30 LAB — DIFFERENTIAL
Basophils Relative: 0 % (ref 0–1)
Eosinophils Absolute: 0 10*3/uL (ref 0.0–0.7)
Eosinophils Relative: 1 % (ref 0–5)
Monocytes Absolute: 0.5 10*3/uL (ref 0.1–1.0)
Monocytes Relative: 8 % (ref 3–12)

## 2011-04-30 LAB — MRSA PCR SCREENING: MRSA by PCR: NEGATIVE

## 2011-04-30 MED ORDER — DEXTROSE 50 % IV SOLN: 25.0000 mL | INTRAVENOUS | Status: DC | PRN

## 2011-04-30 MED ORDER — SODIUM CHLORIDE 0.9 % IV SOLN: INTRAVENOUS | Status: AC

## 2011-04-30 MED ORDER — INSULIN ASPART PROT & ASPART (70-30 MIX) 100 UNIT/ML ~~LOC~~ SUSP
28.0000 [IU] | Freq: Two times a day (BID) | SUBCUTANEOUS | Status: DC
  Administered 2011-04-30 – 2011-05-02 (×4): 28 [IU] via SUBCUTANEOUS
  Filled 2011-04-30 (×2): qty 3

## 2011-04-30 MED ORDER — INSULIN REGULAR HUMAN 100 UNIT/ML IJ SOLN
INTRAMUSCULAR | Status: DC
  Administered 2011-04-30: 4.1 [IU]/h via INTRAVENOUS
  Administered 2011-04-30: 1.5 [IU]/h via INTRAVENOUS
  Filled 2011-04-30: qty 1

## 2011-04-30 MED ORDER — INSULIN REGULAR BOLUS VIA INFUSION
0.0000 [IU] | Freq: Three times a day (TID) | INTRAVENOUS | Status: DC
  Filled 2011-04-30 (×3): qty 10

## 2011-04-30 MED ORDER — SODIUM CHLORIDE 0.9 % IV SOLN
INTRAVENOUS | Status: DC
  Administered 2011-04-30: 2.6 [IU]/h via INTRAVENOUS
  Administered 2011-04-30: 3.7 [IU]/h via INTRAVENOUS
  Administered 2011-04-30: 0.6 [IU]/h via INTRAVENOUS
  Administered 2011-04-30: 3.1 [IU]/h via INTRAVENOUS
  Administered 2011-04-30: 1.2 [IU]/h via INTRAVENOUS
  Filled 2011-04-30: qty 1

## 2011-04-30 MED ORDER — POTASSIUM CHLORIDE CRYS ER 20 MEQ PO TBCR
40.0000 meq | EXTENDED_RELEASE_TABLET | Freq: Once | ORAL | Status: AC
  Administered 2011-04-30: 40 meq via ORAL
  Filled 2011-04-30: qty 2

## 2011-04-30 MED ORDER — ENOXAPARIN SODIUM 40 MG/0.4ML ~~LOC~~ SOLN
40.0000 mg | Freq: Every day | SUBCUTANEOUS | Status: DC
  Administered 2011-04-30 – 2011-05-01 (×2): 40 mg via SUBCUTANEOUS
  Filled 2011-04-30 (×3): qty 0.4

## 2011-04-30 MED ORDER — LEVOTHYROXINE SODIUM 25 MCG PO TABS
25.0000 ug | ORAL_TABLET | Freq: Every day | ORAL | Status: DC
  Administered 2011-04-30 – 2011-05-02 (×3): 25 ug via ORAL
  Filled 2011-04-30 (×4): qty 1

## 2011-04-30 MED ORDER — SODIUM CHLORIDE 0.9 % IV BOLUS (SEPSIS)
2000.0000 mL | Freq: Once | INTRAVENOUS | Status: AC
  Administered 2011-04-30: 2000 mL via INTRAVENOUS

## 2011-04-30 MED ORDER — SODIUM CHLORIDE 0.9 % IV SOLN
INTRAVENOUS | Status: DC
  Administered 2011-04-30: 15:00:00 via INTRAVENOUS

## 2011-04-30 MED ORDER — SODIUM CHLORIDE 0.9 % IV SOLN
INTRAVENOUS | Status: DC
  Administered 2011-04-30: 11:00:00 via INTRAVENOUS

## 2011-04-30 MED ORDER — DEXTROSE-NACL 5-0.45 % IV SOLN
INTRAVENOUS | Status: DC
  Administered 2011-04-30: 11:00:00 via INTRAVENOUS

## 2011-04-30 MED ORDER — POTASSIUM CHLORIDE 10 MEQ/100ML IV SOLN
10.0000 meq | INTRAVENOUS | Status: AC
  Administered 2011-04-30: 10 meq via INTRAVENOUS
  Filled 2011-04-30 (×2): qty 200

## 2011-04-30 MED ORDER — POTASSIUM CHLORIDE 10 MEQ/100ML IV SOLN
10.0000 meq | INTRAVENOUS | Status: AC
  Administered 2011-04-30 (×3): 10 meq via INTRAVENOUS

## 2011-04-30 MED ORDER — INSULIN ASPART 100 UNIT/ML ~~LOC~~ SOLN
0.0000 [IU] | SUBCUTANEOUS | Status: DC
  Administered 2011-04-30: 1 [IU] via SUBCUTANEOUS
  Administered 2011-05-01: 2 [IU] via SUBCUTANEOUS
  Administered 2011-05-01 (×2): 3 [IU] via SUBCUTANEOUS
  Administered 2011-05-01: 7 [IU] via SUBCUTANEOUS
  Administered 2011-05-01: 3 [IU] via SUBCUTANEOUS
  Filled 2011-04-30: qty 3

## 2011-04-30 MED ORDER — DEXTROSE-NACL 5-0.45 % IV SOLN
INTRAVENOUS | Status: DC
  Administered 2011-04-30 – 2011-05-01 (×3): via INTRAVENOUS

## 2011-04-30 MED ORDER — BENZONATATE 100 MG PO CAPS
100.0000 mg | ORAL_CAPSULE | Freq: Three times a day (TID) | ORAL | Status: DC | PRN
  Filled 2011-04-30: qty 1

## 2011-04-30 NOTE — ED Provider Notes (Signed)
History/physical exam/procedure(s) were performed by non-physician practitioner and as supervising physician I was immediately available for consultation/collaboration. I have reviewed all notes and am in agreement with care and plan. Patient seen and examined by me.  Patient with dka and on insulin drip.  Hilario Quarry, MD 04/30/11 [phone]

## 2011-04-30 NOTE — ED Notes (Signed)
Patient with reported cold sx for 3 days.  He has hx of diabetes.  Patient sugar has been 300 to 400.  He has checked his urine and reports elevated keytones.  Patient reports he is feeling tired and weak

## 2011-04-30 NOTE — Progress Notes (Signed)
Orders for IVF and Potassium Chloride order verified since pt's potassium is 4.4.   I spoke with Dr. Dierdre Searles and she stated that it is okay to give 2 runs of potassium.

## 2011-04-30 NOTE — ED Notes (Signed)
Notified RN of CBG 263

## 2011-04-30 NOTE — H&P (Signed)
[name] is an 25 y.o. male.   Chief Complaint: DKA   HPI: [name] is a 25 year old gentleman with past medical history significant for type 1 diabetes who presents to the emergency department for complaint of generalized weakness. [name] was found to have Acidosis with a venous blood gas pH of 7.17. [name] states that his symptoms first began 2-3 days ago. [name] complains of dry cough. No fevers or chills. Additionally [name] also complained of ongoing nausea and abdominal pain however it did not vomit. No diarrhea or constipation. [name] states that [name] is compliant with this 7030 insulin therapy. [name] states that his mother or his sister help with insulin administration. [name] is not on sliding scale insulin as his endocrinologist stopped sliding scale insulin.  Past Medical History  Diagnosis Date  . Diabetes mellitus     Type 1, diagnosed age 89, with frequent DKA admissions, difficult to control due to MR  . Hypothyroidism   . Diabetic nephropathy     Started on Lisinopril 5 mg  . 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 Impaired cognition     mention of mental retardation    History   Social History  . Marital Status: Single    Spouse Name: N/A    Number of Children: N/A  . Years of Education: 12   Occupational History  .  [employer]    makes [employer] for [employer]    Social History Main Topics  . Smoking status: Never Smoker   . Smokeless tobacco: Not on file  . Alcohol Use: No  . Drug Use: No  . Sexually Active: No   Other Topics Concern  . Not on file   Social History Narrative   Lives in [place] with parents. 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 Notable for developmental delay and illiteracy. 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   Family History  Problem Relation Age of Onset  . Vision loss Father      Allergies: No Known Allergies  Medications Prior to Admission  Medication Sig Dispense Refill  . insulin aspart protamine-insulin aspart (NOVOLOG 70/30) (70-30) 100 UNIT/ML injection Inject into the skin. 28 units in am 20 units in pm      . levothyroxine (SYNTHROID, LEVOTHROID) 25 MCG tablet Take 25 mcg by mouth daily.        Marland Kitchen lisinopril (PRINIVIL,ZESTRIL) 5 MG tablet Take 5 mg by mouth daily.          Results for orders placed during the hospital encounter of 04/30/11 (from the past 48 hour(s))  GLUCOSE, CAPILLARY     Status: Abnormal   Collection Time   04/30/11  7:22 AM      Component Value Range Comment   Glucose-Capillary 285 (*) 70 - 99 (mg/dL)    Comment 1 Documented in Chart      Comment 2 Notify RN     BASIC METABOLIC PANEL     Status: Abnormal   Collection Time   04/30/11  7:53 AM      Component Value Range Comment   Sodium 133 (*) 135 - 145 (mEq/L)    Potassium 4.4  3.5 - 5.1 (mEq/L)    Chloride 101  96 - 112 (mEq/L)    CO2 13 (*) 19 - 32 (mEq/L)    Glucose, Bld 332 (*) 70 -  99 (mg/dL)    BUN 10  6 - 23 (mg/dL)    Creatinine, Ser 1.61  0.50 - 1.35 (mg/dL)    Calcium 9.0  8.4 - 10.5 (mg/dL)    GFR calc non Af Amer >90  >90 (mL/min)    GFR calc Af Amer >90  >90 (mL/min)   POCT I-STAT 3, BLOOD GAS (G3P V)     Status: Abnormal   Collection Time   04/30/11  9:55 AM      Component Value Range Comment   pH, Ven 7.173 (*) 7.250 - 7.300     pCO2, Ven 28.7 (*) 45.0 - 50.0 (mmHg)    pO2, Ven 50.0 (*) 30.0 - 45.0 (mmHg)    Bicarbonate 10.5 (*) 20.0 - 24.0 (mEq/L)    TCO2 11  0 - 100 (mmol/L)    O2 Saturation 76.0      Acid-base deficit 16.0 (*) 0.0 - 2.0 (mmol/L)    Sample type VENOUS      Comment NOTIFIED PHYSICIAN     GLUCOSE, CAPILLARY     Status: Abnormal   Collection Time   04/30/11 10:20 AM      Component Value Range Comment   Glucose-Capillary 207 (*) 70 - 99 (mg/dL)    Comment 1 Notify RN     GLUCOSE, CAPILLARY     Status: Abnormal   Collection Time    04/30/11 11:47 AM      Component Value Range Comment   Glucose-Capillary 263 (*) 70 - 99 (mg/dL)    Comment 1 Notify RN     GLUCOSE, CAPILLARY     Status: Abnormal   Collection Time   04/30/11  1:43 PM      Component Value Range Comment   Glucose-Capillary 188 (*) 70 - 99 (mg/dL)    Comment 1 Notify RN      Comment 2 Documented in Chart     CBC     Status: Abnormal   Collection Time   04/30/11  2:00 PM      Component Value Range Comment   WBC 6.3  4.0 - 10.5 (K/uL)    RBC 4.77  4.22 - 5.81 (MIL/uL)    Hemoglobin 15.7  13.0 - 17.0 (g/dL)    HCT 09.6  04.5 - 40.9 (%)    MCV 91.0  78.0 - 100.0 (fL)    MCH 32.9  26.0 - 34.0 (pg)    MCHC 36.2 (*) 30.0 - 36.0 (g/dL)    RDW 81.1  91.4 - 78.2 (%)    Platelets 176  150 - 400 (K/uL)   DIFFERENTIAL     Status: Normal   Collection Time   04/30/11  2:00 PM      Component Value Range Comment   Neutrophils Relative 65  43 - 77 (%)    Neutro Abs 4.1  1.7 - 7.7 (K/uL)    Lymphocytes Relative 26  12 - 46 (%)    Lymphs Abs 1.7  0.7 - 4.0 (K/uL)    Monocytes Relative 8  3 - 12 (%)    Monocytes Absolute 0.5  0.1 - 1.0 (K/uL)    Eosinophils Relative 1  0 - 5 (%)    Eosinophils Absolute 0.0  0.0 - 0.7 (K/uL)    Basophils Relative 0  0 - 1 (%)    Basophils Absolute 0.0  0.0 - 0.1 (K/uL)    No results found.  Review of Systems  All other systems reviewed and are negative.  Blood pressure 117/73, pulse 75, temperature 98.1 F (36.7 C), temperature source Oral, resp. rate 17, height 5\' 5"  (1.651 m), weight 117 lb 8.1 oz (53.3 kg), SpO2 100.00%. Physical Exam  Constitutional: [name] is oriented to person, place, and time. [name] appears well-developed.  HENT:  Head: Normocephalic.  Mouth/Throat: Oropharynx is clear and moist. No oropharyngeal exudate.  Eyes:       Wearing thick framed glasses  Neck: Normal range of motion. Neck supple.  Cardiovascular: Normal rate and regular rhythm.   Respiratory: Effort normal and breath sounds normal. [name] has no  wheezes. [name] has no rales.  GI: Soft. Bowel sounds are normal. [name] exhibits no distension. There is no tenderness. There is no guarding.  Musculoskeletal: Normal range of motion.  Neurological: [name] is alert and oriented to person, place, and time.  Skin: Skin is warm and dry.     Assessment/Plan  1.) Diabetic ketoacidosis - Anion gap of 19 with venous blood gas pH of 7.17. Likely precipitated by viral URI as well as insulin noncompliance. Spoke with Dr. Fransico Michael on the phone who provided further history on the [name]. Dr. Fransico Michael (MEDS/PEDS) first saw the [name] in May 2007. [name] has a long-standing history of medication noncompliance as his parents will not administer insulin. [name] also has a history of being uncooperative with his sister or other family members when insulin was administered. His last office visit was in October 2012 where [name] had an A1C of less than 13. His best hemoglobin A1c was in July of 2012 which was 10.6.  Plan: -Admit to step-down unit -Start DKA protocol with Q2 BMETs and CBGs -Insulin drip as indicated on protocol, with transition to D5 when CBG<250, wean off insulin drip and start basal insulin when CBG <250  -K repletion to ensure between 4-5   2.) Hypothyroidism - [name] is on synthroid. [name] is followed by Dr. Fransico Michael (Peds Endo/DM specialist). Check TSH. Resume home dose synthroid.   3.) Cognition - [name] has a form of mental retardation that does not have a formal diagnosis. [name] has difficulty with diabetes control secondary to this problem. [name] needs more assistance with his insulin administration. May need to consider social work consult for further assistance. Because the [name] cannot drive [name] will need a PCP that is close to his home in [place], [place].  4.) Type 1 DM -  Lab Results  Component Value Date   HGBA1C <13.0 03/16/2011   As mentioned previously poorly controlled secondary to lack of family support and  cooperation from [name]. Spoke with [name]'s endocrinologist Dr. Fransico Michael who states that [name]'s diabetes was difficult to control since 2007 when [name] first met [name]. [name] at one time was on sliding scale insulin however this was discontinued by Dr. Fransico Michael and [name] was noncompliant with it. We'll resume 7030 insulin at home dose with appropriate titration as needed. Additionally [name] will need to be followed by a primary care physician that can be arranged near his hometown.  Aishani Kalis 04/30/2011, 3:10 PM

## 2011-04-30 NOTE — ED Notes (Signed)
Called to give report to 2600. Nurse unavailable. Awaiting return call. 

## 2011-04-30 NOTE — ED Provider Notes (Addendum)
History     CSN: [id number] Arrival date & time: 04/30/2011  7:20 AM   First MD Initiated Contact with Patient 04/30/11 [phone]      Chief Complaint  Patient presents with  . Sore Throat  . URI  . Hyperglycemia    (Consider location/radiation/quality/duration/timing/severity/associated sxs/prior treatment) HPI Comments: Patient with history of type 1 diabetes presents complaining of hyperglycemia to 300-400 and upper respiratory symptoms for 3 days. Patient complains of runny nose, nasal congestion, sore throat, and malaise. The patient's mother states that they have been using some ketone sticks at home which reports elevated ketones. Patient has had some nausea but no vomiting. Patient denies abdominal pain or confusion. The patient has been taking his insulin as directed. Patient states that he has been sipping on water but has not been drinking much. He states this is because any oral intake makes him feel nauseous.  Patient is a 25 y.o. male presenting with pharyngitis and URI. The history is provided by the patient and a relative.  Sore Throat This is a new problem. The current episode started in the past 7 days. The problem occurs constantly. Associated symptoms include congestion, fatigue, nausea and a sore throat. Pertinent negatives include no abdominal pain, chest pain, chills, coughing, fever, headaches, myalgias, rash, urinary symptoms or vomiting. The symptoms are aggravated by nothing. He has tried nothing for the symptoms.  URI The primary symptoms include fatigue, sore throat and nausea. Primary symptoms do not include fever, headaches, ear pain, cough, abdominal pain, vomiting, myalgias or rash.  Symptoms associated with the illness include congestion and rhinorrhea. The illness is not associated with chills.    Past Medical History  Diagnosis Date  . Diabetes mellitus     History reviewed. No pertinent past surgical history.  No family history on file.  History    Substance Use Topics  . Smoking status: Never Smoker   . Smokeless tobacco: Not on file  . Alcohol Use: No      Review of Systems  Constitutional: Positive for fatigue. Negative for fever and chills.  HENT: Positive for congestion, sore throat and rhinorrhea. Negative for ear pain.   Eyes: Negative for redness.  Respiratory: Negative for cough and shortness of breath.   Cardiovascular: Negative for chest pain.  Gastrointestinal: Positive for nausea. Negative for vomiting, abdominal pain, diarrhea and constipation.  Genitourinary: Negative for dysuria, decreased urine volume and difficulty urinating.  Musculoskeletal: Negative for myalgias.  Skin: Negative for rash.  Neurological: Negative for dizziness and headaches.  Hematological: Negative for adenopathy.    Allergies  Review of patient's allergies indicates no known allergies.  Home Medications   Current Outpatient Rx  Name Route Sig Dispense Refill  . INSULIN ASPART PROT & ASPART (70-30) 100 UNIT/ML [place] SUSP Subcutaneous Inject into the skin. 28 units in am 20 units in pm    . LEVOTHYROXINE SODIUM 25 MCG PO TABS Oral Take 25 mcg by mouth daily.      Marland Kitchen LISINOPRIL 5 MG PO TABS Oral Take 5 mg by mouth daily.        BP 119/76  Pulse 90  Temp(Src) 98.2 F (36.8 C) (Oral)  Resp 16  SpO2 100%  Physical Exam  Nursing note and vitals reviewed. Constitutional: He is oriented to person, place, and time. He appears well-developed and well-nourished.  HENT:  Head: Normocephalic and atraumatic.  Nose: Mucosal edema and rhinorrhea present.  Mouth/Throat: Mucous membranes are not pale and dry.  Eyes: Conjunctivae are  normal. Right eye exhibits no discharge. Left eye exhibits no discharge.  Neck: Normal range of motion. Neck supple.  Cardiovascular: Regular rhythm and normal heart sounds.  Tachycardia present.   Pulmonary/Chest: Effort normal and breath sounds normal. No respiratory distress.  Abdominal: Soft. Bowel sounds  are normal. There is no tenderness.  Neurological: He is alert and oriented to person, place, and time.  Skin: Skin is warm and dry.  Psychiatric: He has a normal mood and affect.    ED Course  Procedures (including critical care time)  Labs Reviewed  GLUCOSE, CAPILLARY - Abnormal; Notable for the following:    Glucose-Capillary 285 (*)    All other components within normal limits  BASIC METABOLIC PANEL - Abnormal; Notable for the following:    Sodium 133 (*)    CO2 13 (*)    Glucose, Bld 332 (*)    All other components within normal limits  POCT I-STAT 3, BLOOD GAS (G3P V) - Abnormal; Notable for the following:    pH, Ven 7.173 (*)    pCO2, Ven 28.7 (*)    pO2, Ven 50.0 (*)    Bicarbonate 10.5 (*)    Acid-base deficit 16.0 (*)    All other components within normal limits  GLUCOSE, CAPILLARY - Abnormal; Notable for the following:    Glucose-Capillary 207 (*)    All other components within normal limits  BLOOD GAS, VENOUS  POCT CBG MONITORING   No results found.   1. Diabetic ketoacidosis   2. Dehydration   3. Upper respiratory tract infection     9:34 AM patient was seen previously and examined. 2 L normal saline were initiated. BMP shows elevated anion gap at 19. VBG ordered to assess blood pH.  10:11 AM Glucose stabilizer initiated.   11:17 AM PT seen with Dr. Rosalia Hammers. Will admit for DKA and dehydration.   11:23 AM outpatient clinics to admit. Patient sees Dr. Fransico Michael who is a pediatric endocrinologist.  MDM  Admit for DKA, dehydration. Do not suspect pneumonia.         Carolee Rota, PA 04/30/11 1125  [place], [place] 08/05/11 1441

## 2011-04-30 NOTE — Progress Notes (Deleted)
IVF order verified and Dr. Dierdre Searles ordered to infused Normal saline if CBG is < 250 mg/dl.

## 2011-05-01 ENCOUNTER — Inpatient Hospital Stay (HOSPITAL_COMMUNITY): Payer: Medicare Other

## 2011-05-01 DIAGNOSIS — J069 Acute upper respiratory infection, unspecified: Secondary | ICD-10-CM

## 2011-05-01 LAB — BASIC METABOLIC PANEL
BUN: 5 mg/dL — ABNORMAL LOW (ref 6–23)
BUN: 7 mg/dL (ref 6–23)
CO2: 19 mEq/L (ref 19–32)
CO2: 22 mEq/L (ref 19–32)
CO2: 24 mEq/L (ref 19–32)
Calcium: 8.3 mg/dL — ABNORMAL LOW (ref 8.4–10.5)
Chloride: 105 mEq/L (ref 96–112)
Chloride: 105 mEq/L (ref 96–112)
Chloride: 109 mEq/L (ref 96–112)
Creatinine, Ser: 0.56 mg/dL (ref 0.50–1.35)
Creatinine, Ser: 0.59 mg/dL (ref 0.50–1.35)
GFR calc Af Amer: >90 mL/min (ref >90)
GFR calc Af Amer: >90 mL/min (ref >90)
GFR calc non Af Amer: >90 mL/min (ref >90)
GFR calc non Af Amer: >90 mL/min (ref >90)
Glucose, Bld: 233 mg/dL — ABNORMAL HIGH (ref 70–99)
Potassium: 3.7 mEq/L (ref 3.5–5.1)
Potassium: 3.6 mEq/L (ref 3.5–5.1)
Potassium: 3.5 mEq/L (ref 3.5–5.1)
Potassium: 3.7 mEq/L (ref 3.5–5.1)
Sodium: 134 mEq/L — ABNORMAL LOW (ref 135–145)
Sodium: 136 mEq/L (ref 135–145)

## 2011-05-01 LAB — GLUCOSE, CAPILLARY
Glucose-Capillary: 181 mg/dL — ABNORMAL HIGH (ref 70–99)
Glucose-Capillary: 200 mg/dL — ABNORMAL HIGH (ref 70–99)
Glucose-Capillary: 232 mg/dL — ABNORMAL HIGH (ref 70–99)
Glucose-Capillary: 316 mg/dL — ABNORMAL HIGH (ref 70–99)

## 2011-05-01 LAB — TSH: TSH: 4.452 u[IU]/mL (ref 0.350–4.500)

## 2011-05-01 LAB — HEMOGLOBIN A1C: Hgb A1c MFr Bld: 12.2 % — ABNORMAL HIGH (ref <5.7)

## 2011-05-01 MED ORDER — SODIUM CHLORIDE 0.9 % IJ SOLN
3.0000 mL | Freq: Two times a day (BID) | INTRAMUSCULAR | Status: DC
  Administered 2011-05-01 – 2011-05-02 (×3): 3 mL via INTRAVENOUS

## 2011-05-01 MED ORDER — INSULIN ASPART 100 UNIT/ML ~~LOC~~ SOLN
0.0000 [IU] | SUBCUTANEOUS | Status: DC
  Administered 2011-05-01: 3 [IU] via SUBCUTANEOUS
  Administered 2011-05-02: 5 [IU] via SUBCUTANEOUS
  Administered 2011-05-02: 3 [IU] via SUBCUTANEOUS

## 2011-05-01 MED ORDER — SODIUM CHLORIDE 0.9 % IV SOLN
INTRAVENOUS | Status: DC
  Administered 2011-05-01 – 2011-05-02 (×3): via INTRAVENOUS

## 2011-05-01 MED ORDER — POTASSIUM CHLORIDE CRYS ER 20 MEQ PO TBCR
40.0000 meq | EXTENDED_RELEASE_TABLET | Freq: Once | ORAL | Status: AC
  Administered 2011-05-01: 40 meq via ORAL
  Filled 2011-05-01: qty 2

## 2011-05-01 NOTE — H&P (Signed)
Internal Medicine Teaching Service Attending Note Date: 05/01/2011  Patient name: [name]  Medical record number: [id number]  Date of birth: Sep 24, 1985   I have seen and evaluated Tanna Savoy Cumpian and discussed their care with the Residency Team.  25 yo with type 1 diabetes and multiple previous admissions here with DKA.  Recent URI symtoms.  On 70/30 insulin due to poor compliance at baseline and A1Cs very high.    Physical Exam: Blood pressure 122/87, pulse 74, temperature 97.5 F (36.4 C), temperature source Oral, resp. rate 16, height 5\' 5"  (1.651 m), weight 123 lb 3.8 oz (55.9 kg), SpO2 100.00%. BP 122/87  Pulse 74  Temp(Src) 97.5 F (36.4 C) (Oral)  Resp 16  Ht 5\' 5"  (1.651 m)  Wt 123 lb 3.8 oz (55.9 kg)  BMI 20.51 kg/m2  SpO2 100% General appearance: alert and no distress Chest wall: no tenderness  Lab results: Results for orders placed during the hospital encounter of 04/30/11 (from the past 24 hour(s))  GLUCOSE, CAPILLARY     Status: Abnormal   Collection Time   04/30/11 11:47 AM      Component Value Range   Glucose-Capillary 263 (*) 70 - 99 (mg/dL)   Comment 1 Notify RN    MRSA PCR SCREENING     Status: Normal   Collection Time   04/30/11 12:35 PM      Component Value Range   MRSA by PCR NEGATIVE  NEGATIVE   GLUCOSE, CAPILLARY     Status: Abnormal   Collection Time   04/30/11  1:43 PM      Component Value Range   Glucose-Capillary 188 (*) 70 - 99 (mg/dL)   Comment 1 Notify RN     Comment 2 Documented in Chart    BASIC METABOLIC PANEL     Status: Abnormal   Collection Time   04/30/11  2:00 PM      Component Value Range   Sodium 137  135 - 145 (mEq/L)   Potassium 3.4 (*) 3.5 - 5.1 (mEq/L)   Chloride 108  96 - 112 (mEq/L)   CO2 15 (*) 19 - 32 (mEq/L)   Glucose, Bld 207 (*) 70 - 99 (mg/dL)   BUN 8  6 - 23 (mg/dL)   Creatinine, Ser 1.47  0.50 - 1.35 (mg/dL)   Calcium 8.3 (*) 8.4 - 10.5 (mg/dL)   GFR calc non Af Amer >90  >90 (mL/min)   GFR  calc Af Amer >90  >90 (mL/min)  CBC     Status: Abnormal   Collection Time   04/30/11  2:00 PM      Component Value Range   WBC 6.3  4.0 - 10.5 (K/uL)   RBC 4.77  4.22 - 5.81 (MIL/uL)   Hemoglobin 15.7  13.0 - 17.0 (g/dL)   HCT 82.9  56.2 - 13.0 (%)   MCV 91.0  78.0 - 100.0 (fL)   MCH 32.9  26.0 - 34.0 (pg)   MCHC 36.2 (*) 30.0 - 36.0 (g/dL)   RDW 86.5  78.4 - 69.6 (%)   Platelets 176  150 - 400 (K/uL)  DIFFERENTIAL     Status: Normal   Collection Time   04/30/11  2:00 PM      Component Value Range   Neutrophils Relative 65  43 - 77 (%)   Neutro Abs 4.1  1.7 - 7.7 (K/uL)   Lymphocytes Relative 26  12 - 46 (%)   Lymphs Abs 1.7  0.7 -  4.0 (K/uL)   Monocytes Relative 8  3 - 12 (%)   Monocytes Absolute 0.5  0.1 - 1.0 (K/uL)   Eosinophils Relative 1  0 - 5 (%)   Eosinophils Absolute 0.0  0.0 - 0.7 (K/uL)   Basophils Relative 0  0 - 1 (%)   Basophils Absolute 0.0  0.0 - 0.1 (K/uL)  GLUCOSE, CAPILLARY     Status: Abnormal   Collection Time   04/30/11  2:54 PM      Component Value Range   Glucose-Capillary 182 (*) 70 - 99 (mg/dL)   Comment 1 Notify RN    GLUCOSE, CAPILLARY     Status: Abnormal   Collection Time   04/30/11  3:43 PM      Component Value Range   Glucose-Capillary 164 (*) 70 - 99 (mg/dL)   Comment 1 Documented in Chart     Comment 2 Notify RN    GLUCOSE, CAPILLARY     Status: Abnormal   Collection Time   04/30/11  5:10 PM      Component Value Range   Glucose-Capillary 119 (*) 70 - 99 (mg/dL)   Comment 1 Documented in Chart     Comment 2 Notify RN    BASIC METABOLIC PANEL     Status: Abnormal   Collection Time   04/30/11  5:35 PM      Component Value Range   Sodium 137  135 - 145 (mEq/L)   Potassium 3.4 (*) 3.5 - 5.1 (mEq/L)   Chloride 111  96 - 112 (mEq/L)   CO2 15 (*) 19 - 32 (mEq/L)   Glucose, Bld 147 (*) 70 - 99 (mg/dL)   BUN 7  6 - 23 (mg/dL)   Creatinine, Ser 0.45  0.50 - 1.35 (mg/dL)   Calcium 8.2 (*) 8.4 - 10.5 (mg/dL)   GFR calc non Af Amer >90  >90  (mL/min)   GFR calc Af Amer >90  >90 (mL/min)  GLUCOSE, CAPILLARY     Status: Abnormal   Collection Time   04/30/11  6:05 PM      Component Value Range   Glucose-Capillary 124 (*) 70 - 99 (mg/dL)   Comment 1 Documented in Chart     Comment 2 Notify RN    GLUCOSE, CAPILLARY     Status: Abnormal   Collection Time   04/30/11  7:21 PM      Component Value Range   Glucose-Capillary 136 (*) 70 - 99 (mg/dL)   Comment 1 Notify RN    BASIC METABOLIC PANEL     Status: Abnormal   Collection Time   04/30/11 10:40 PM      Component Value Range   Sodium 132 (*) 135 - 145 (mEq/L)   Potassium 3.8  3.5 - 5.1 (mEq/L)   Chloride 106  96 - 112 (mEq/L)   CO2 18 (*) 19 - 32 (mEq/L)   Glucose, Bld 259 (*) 70 - 99 (mg/dL)   BUN 7  6 - 23 (mg/dL)   Creatinine, Ser 4.09  0.50 - 1.35 (mg/dL)   Calcium 8.2 (*) 8.4 - 10.5 (mg/dL)   GFR calc non Af Amer >90  >90 (mL/min)   GFR calc Af Amer >90  >90 (mL/min)  GLUCOSE, CAPILLARY     Status: Abnormal   Collection Time   05/01/11 12:28 AM      Component Value Range   Glucose-Capillary 232 (*) 70 - 99 (mg/dL)   Comment 1 Documented in Chart  Comment 2 Notify RN    BASIC METABOLIC PANEL     Status: Abnormal   Collection Time   05/01/11  4:07 AM      Component Value Range   Sodium 134 (*) 135 - 145 (mEq/L)   Potassium 3.7  3.5 - 5.1 (mEq/L)   Chloride 107  96 - 112 (mEq/L)   CO2 19  19 - 32 (mEq/L)   Glucose, Bld 233 (*) 70 - 99 (mg/dL)   BUN 6  6 - 23 (mg/dL)   Creatinine, Ser 9.56  0.50 - 1.35 (mg/dL)   Calcium 8.3 (*) 8.4 - 10.5 (mg/dL)   GFR calc non Af Amer >90  >90 (mL/min)   GFR calc Af Amer >90  >90 (mL/min)  GLUCOSE, CAPILLARY     Status: Abnormal   Collection Time   05/01/11  4:50 AM      Component Value Range   Glucose-Capillary 229 (*) 70 - 99 (mg/dL)   Comment 1 Documented in Chart     Comment 2 Notify RN    GLUCOSE, CAPILLARY     Status: Abnormal   Collection Time   05/01/11  7:54 AM      Component Value Range   Glucose-Capillary  181 (*) 70 - 99 (mg/dL)   Comment 1 Documented in Chart     Comment 2 Notify RN    BASIC METABOLIC PANEL     Status: Abnormal   Collection Time   05/01/11  8:38 AM      Component Value Range   Sodium 136  135 - 145 (mEq/L)   Potassium 3.6  3.5 - 5.1 (mEq/L)   Chloride 105  96 - 112 (mEq/L)   CO2 19  19 - 32 (mEq/L)   Glucose, Bld 207 (*) 70 - 99 (mg/dL)   BUN 5 (*) 6 - 23 (mg/dL)   Creatinine, Ser 2.13  0.50 - 1.35 (mg/dL)   Calcium 8.8  8.4 - 08.6 (mg/dL)   GFR calc non Af Amer >90  >90 (mL/min)   GFR calc Af Amer >90  >90 (mL/min)    Imaging results:  Dg Chest 2 View  05/01/2011  *RADIOLOGY REPORT*  Clinical Data: Cough.  CHEST - 2 VIEW  Comparison: 06/17/2006  Findings: Heart and mediastinal contours are within normal limits. No focal opacities or effusions.  No acute bony abnormality.  IMPRESSION: No active cardiopulmonary disease.  Original Report Authenticated By: Cyndie Chime, M.D.    Assessment and Plan: I agree with the formulated Assessment and Plan with the following changes: His DKA is improving and he is off the drip and eating well. Unfortunately, he has a poor situation at home that does not get him adequate care.  SW will be consulted to assess and see if there are other services that can be provided for him.

## 2011-05-01 NOTE — Progress Notes (Signed)
CSW spoke with pt's mother, Lawson Fiscal, RE: pt not receiving insulin as directed by MD. Pt's mother reports that she and pt's sister have been working to establish a better insulin administration plan with insulin being administered once in morning and again in evening. Pt's mother explained pt not always at home when insulin scheduled and that although he is able to administer insulin himself,  he is not always willing to take insulin. Pt's mother verbalized understanding of importance of pt receiving insulin. Pt currently works M-F 7:30am-4:00pm at [employer] for [employer]. Per pt's mother, no financial barrier to obtaining insulin or other medications. Pt/pt'sfamily may benefit from diabetic teaching refresher. No other CSW needs reported or noted. CSW signing off. Please re-consult if further CSW needs arise.  Dellie Burns, MSW, [place] [phone] (weekend)

## 2011-05-01 NOTE — Progress Notes (Signed)
Subjective: Patient feels well. No c/o or acute distress noted. No acute events overnight per nursing. Objective: Vital signs in last 24 hours: Filed Vitals:   05/01/11 0500 05/01/11 0755 05/01/11 0800 05/01/11 1221  BP: 118/85 122/87 122/87 100/64  Pulse: 64 80 74 80  Temp:  97.5 F (36.4 C) 97.5 F (36.4 C) 98.1 F (36.7 C)  TempSrc:  Oral Oral Oral  Resp: 23 16 16 16   Height:      Weight:      SpO2: 100% 100% 100% 100%   Weight change:   Intake/Output Summary (Last 24 hours) at 05/01/11 1304 Last data filed at 05/01/11 1000  Gross per 24 hour  Intake   3603 ml  Output   3100 ml  Net    503 ml   Physical Exam: Constitutional: He is oriented to person, place, and time. He appears well-developed.  HENT: Head: Normocephalic.  Mouth/Throat: Oropharynx is clear and moist. No oropharyngeal exudate.  Eyes: Wearing thick framed glasses  Neck: Normal range of motion. Neck supple.  Cardiovascular: Normal rate and regular rhythm.  Respiratory: Effort normal and breath sounds normal. He has no wheezes. He has no rales.  GI: Soft. Bowel sounds are normal. He exhibits no distension. There is no tenderness. There is no guarding.  Musculoskeletal: Normal range of motion.  Neurological: He is alert and oriented to person, place, and time.  Skin: Skin is warm and dry.   Lab Results: Basic Metabolic Panel:  Lab 05/01/11 1191 05/01/11 0838  Vandana Haman 135 136  K 3.5 3.6  CL 105 105  CO2 22 19  GLUCOSE 264* 207*  BUN 5* 5*  CREATININE 0.56 0.51  CALCIUM 8.7 8.8  MG -- --  PHOS -- --   CBC:  Lab 04/30/11 1400  WBC 6.3  NEUTROABS 4.1  HGB 15.7  HCT 43.4  MCV 91.0  PLT 176   CBG:  Lab 05/01/11 1220 05/01/11 0754 05/01/11 0450 05/01/11 0028 04/30/11 1921 04/30/11 1805  GLUCAP 239* 181* 229* 232* 136* 124*    Thyroid Function Tests:  Lab 05/01/11 0407  TSH 4.452  T4TOTAL --  FREET4 --  T3FREE --  THYROIDAB --   Misc. Labs: GC/Chlamydia Pending HIV antibody  Pending HGB A1C pending  Micro Results: Recent Results (from the past 240 hour(s))  MRSA PCR SCREENING     Status: Normal   Collection Time   04/30/11 12:35 PM      Component Value Range Status Comment   MRSA by PCR NEGATIVE  NEGATIVE  Final    Studies/Results: Dg Chest 2 View  05/01/2011  *RADIOLOGY REPORT*  Clinical Data: Cough.  CHEST - 2 VIEW  Comparison: 06/17/2006  Findings: Heart and mediastinal contours are within normal limits. No focal opacities or effusions.  No acute bony abnormality.  IMPRESSION: No active cardiopulmonary disease.  Original Report Authenticated By: Cyndie Chime, M.D.   Medications: I have reviewed the patient's current medications. Scheduled Meds:    . enoxaparin  40 mg Subcutaneous q1800  . insulin aspart  0-9 Units Subcutaneous Q4H  . insulin aspart protamine-insulin aspart  28 Units Subcutaneous BID WC  . levothyroxine  25 mcg Oral QAC breakfast  . potassium chloride  10 mEq Intravenous Q1H  . potassium chloride  10 mEq Intravenous Q1 Hr x 4  . potassium chloride  40 mEq Oral Once  . potassium chloride  40 mEq Oral Once  . potassium chloride  40 mEq Oral Once  . sodium  chloride  3 mL Intravenous Q12H  . DISCONTD: insulin regular  0-10 Units Intravenous TID WC   Continuous Infusions:    . sodium chloride    . sodium chloride 100 mL/hr at 05/01/11 0746  . DISCONTD: sodium chloride 125 mL/hr at 04/30/11 1033  . DISCONTD: sodium chloride 150 mL/hr at 04/30/11 1443  . DISCONTD: dextrose 5 % and 0.45% NaCl 125 mL/hr at 04/30/11 1034  . DISCONTD: dextrose 5 % and 0.45% NaCl 200 mL/hr at 05/01/11 0441  . DISCONTD: insulin (NOVOLIN-R) infusion 4.1 Units/hr (04/30/11 1151)  . DISCONTD: insulin (NOVOLIN-R) infusion 0.6 Units/hr (04/30/11 1817)   PRN Meds:.benzonatate, dextrose, DISCONTD: dextrose Assessment/Plan:  # Diabetic ketoacidosis - resolved. Patient presents with the DKA, likely precipitated by viral URI as well as insulin noncompliance.   Patient physician Dr. Fransico Michael (MEDS/PEDS) first saw the patient in May 2007. Patient has a long-standing history of medication noncompliance as his parents will not administer insulin. Patient also has a history of being uncooperative with his sister or other family members when insulin was administered. His last office visit was in October 2012 where patient had an A1C of less than 13. Patient at one time was on sliding scale insulin however this was discontinued by Dr. Fransico Michael and patient was noncompliant with it. His best hemoglobin A1c was in July of 2012 which was 10.6.Patient condition has largely improved overnight. His Gap is closed. His home dose of insulin was initiated and insulin gtt has been weaned off. Patient is stable.  Plan:  -step-down unit  -DKA protocol -BMP at 8pm and in am. - SW assessment home situation.  # Hypothyroidism - Patient is on synthroid. Patient is followed by Dr. Fransico Michael (Peds Endo/DM specialist).  Resume home dose synthroid.   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 Patient has difficulty with diabetes control secondary to this problem. Patient needs more assistance with his insulin administration.  Because the patient cannot drive he will need a PCP that is close to his home in [place], [place].   - social work consult for further assistance.   # Type 1 DM -  Lab Results   Component  Value  Date    HGBA1C  <13.0  03/16/2011    As mentioned previously poorly controlled secondary to lack of family support and cooperation from patient. Spoke with patient's endocrinologist Dr. Fransico Michael who states that patient's diabetes was difficult to control since 2007 when he first met patient. Patient at one time was on sliding scale insulin however this was discontinued by Dr. Fransico Michael and patient was noncompliant with it. We'll resume 7030 insulin at home dose with appropriate titration as needed. Additionally  patient will need to be followed by a primary care physician that can be arranged near his hometown.  VTE: Lovenox      LOS: 1 day   Hideo Googe 05/01/2011, 1:04 PM

## 2011-05-02 DIAGNOSIS — E860 Dehydration: Secondary | ICD-10-CM

## 2011-05-02 DIAGNOSIS — E1110 Type 2 diabetes mellitus with ketoacidosis without coma: Secondary | ICD-10-CM

## 2011-05-02 LAB — GLUCOSE, CAPILLARY
Glucose-Capillary: 158 mg/dL — ABNORMAL HIGH (ref 70–99)
Glucose-Capillary: 66 mg/dL — ABNORMAL LOW (ref 70–99)

## 2011-05-02 LAB — BASIC METABOLIC PANEL
CO2: 26 mEq/L (ref 19–32)
GFR calc non Af Amer: >90 mL/min (ref >90)
Glucose, Bld: 206 mg/dL — ABNORMAL HIGH (ref 70–99)
Potassium: 3.7 mEq/L (ref 3.5–5.1)
Sodium: 142 mEq/L (ref 135–145)

## 2011-05-02 MED ORDER — INSULIN ASPART PROT & ASPART (70-30 MIX) 100 UNIT/ML ~~LOC~~ SUSP: SUBCUTANEOUS | Status: DC

## 2011-05-02 MED ORDER — LEVOTHYROXINE SODIUM 25 MCG PO TABS: 25.0000 ug | ORAL_TABLET | Freq: Every day | ORAL | Status: DC

## 2011-05-02 MED ORDER — INSULIN ASPART PROT & ASPART (70-30 MIX) 100 UNIT/ML ~~LOC~~ SUSP: 28.0000 [IU] | Freq: Two times a day (BID) | SUBCUTANEOUS | Status: DC

## 2011-05-02 MED ORDER — BENZONATATE 100 MG PO CAPS: 100.0000 mg | ORAL_CAPSULE | Freq: Three times a day (TID) | ORAL | Status: AC | PRN

## 2011-05-02 NOTE — Discharge Summary (Signed)
Physician Discharge Summary  Patient ID: [name] MRN: [id number] DOB/AGE: 1985-06-11 25 y.o.  Admit date: 04/30/2011 Discharge date: 05/02/2011  Admission Diagnoses: DKA DMI Hypothyroidism  Discharge Diagnoses:  DKA - Resolved DMI Hypothyroidism  Discharged Condition: pt is discharged in stable and improved condition.  Brief H&P: [name] is a 25 year old gentleman with past medical history significant for type 1 diabetes who presents to the emergency department for complaint of generalized weakness. Patient was found to have metabolic acidosis with a venous blood gas pH of 7.17 and anion gap of 20. Patient states that his symptoms first began 2-3 days ago. He complains of dry cough. No fevers or chills. Additionally patient also complained of ongoing nausea and abdominal pain however did not vomit. He denied diarrhea and constipation. Patient states that he is compliant with this 70/30 insulin therapy. He states that his mother or his sister help with insulin administration. Patient is not on sliding scale insulin as his endocrinologist stopped sliding scale insulin.  Please refer to full admission H&P for full details including admission labs and vitals.  Hospital Course:   DKA: Pts DKA was likely precipitated by viral URI as well as questionable compliance with his home insulin regimen; pt often refuses insulin administration at home.  He was initially admitted to a step-down bed and placed on an insulin gtt.  K was repleted as needed to prevent hypokalemia from insulin administration.  The patient did well and was successfully transitioned off of his insulin gtt to insulin 70/30 following resolution of his anion gap metabolic acidosis.  His A1c is was elevated at 12.2 which is consistent with poor control of his blood glucose.  CXR was obtained to evaluate for PNA or other acute process contributing to DKA; there was no evidence of PNA or other concerning abnormality.  Pt  remained afebrile and hemodynamically stable.  The patient was educated about the importance of compliance with insulin and potentially deadly complications of DKA and poorly controlled DM.  The patient states he is capable of adhering to insulin tx.  SW was consulted to ensure home environment was safe; parents stated they were willing and able to assist pt with insulin at home.  Pt will be d/c'd home with rx for insulin.  He will need to follow up with his endocrinologist for further management.    Cough: pt reported a dry cough and malaise prior to admission.  His work up was unrevealing for any acute infectious process and he remained afebrile.  His symptoms are most likely the result of a viral URI; there was no indication to initiate antibiotic therapy.  Pt will be sent home with a rx for Tesslon for symptomatic relief of cough.  He is advised to use other OTC medications as needed for relief of sinus congestion.  HIV Ab was obtained and was non-reactive.  Hypothyroidism: pt was maintained on his home dose of synthroid without complication.  Consults: none  Significant Diagnostic Studies:  CXR: no acute cardiopulmonary process  Discharge Exam: Blood pressure 109/78, pulse 87, temperature 98.7 F (37.1 C), temperature source Oral, resp. rate 11, height 5\' 5"  (1.651 m), weight 121 lb 4.1 oz (55 kg), SpO2 100.00%. VItal signs reviewed and stable. GEN: No apparent distress.  Thin 2/23 y/o M Alert and oriented x 3.  Pleasant, conversant, and cooperative to exam. HEENT: head is autraumatic and normocephalic.  Neck is supple without palpable masses or lymphadenopathy.  Vision intact.  EOMI.  PERRLA.  Sclerae  anicteric.  Conjunctivae without pallor or injection. Mucous membranes are moist.  Oropharynx is without erythema, exudates, or other abnormal lesions.   RESP:  Lungs are clear to ascultation bilaterally with good air movement.  No wheezes, ronchi, or rubs. CARDIOVASCULAR: regular rate, normal  rhythm.  Clear S1, S2, no murmurs, gallops, or rubs. ABDOMEN: soft, non-tender, non-distended.  Bowels sounds present in all quadrants and normoactive.  No palpable masses. EXT: warm and dry.  Peripheral pulses equal, intact, and +2 globally.  No clubbing or cyanosis.  No edema in bilateral lower extremities. SKIN: warm and dry with normal turgor.  No rashes or abnormal lesions observed. NEURO: CN II-XII grossly intact.   No focal deficit.   Disposition: Home or Self Care  Discharge Orders    Future Appointments: Provider: Department: Dept Phone: Center:   06/09/2011 10:00 AM David Stall, MD [facility] [phone] [facility]     Future Orders Please Complete By Expires   Diet Carb Modified      Increase activity slowly      Discharge instructions      Comments:   Be sure to take your insulin as directed.  No not miss any doses.  Check your blood sugar at least three times a day. You need to make a follow up appointment with your endocrinologist within 7 days of being home from the hospital.     Current Discharge Medication List    START taking these medications   Details  benzonatate (TESSALON) 100 MG capsule Take 1 capsule (100 mg total) by mouth 3 (three) times daily as needed for cough. Qty: 30 capsule, Refills: 0    !! insulin aspart protamine-insulin aspart (NOVOLOG 70/30) (70-30) 100 UNIT/ML injection Inject 28 Units into the skin 2 (two) times daily with a meal. Qty: 10 mL, Refills: 0     !! - Potential duplicate medications found. Please discuss with provider.    CONTINUE these medications which have CHANGED   Details  !! levothyroxine (SYNTHROID, LEVOTHROID) 25 MCG tablet Take 1 tablet (25 mcg total) by mouth daily before breakfast. Qty: 30 tablet, Refills: 0     !! - Potential duplicate medications found. Please discuss with provider.    CONTINUE these medications which have NOT CHANGED   Details  !! insulin aspart protamine-insulin aspart (NOVOLOG  70/30) (70-30) 100 UNIT/ML injection Inject into the skin. 28 units in am 20 units in pm    !! levothyroxine (SYNTHROID, LEVOTHROID) 25 MCG tablet Take 25 mcg by mouth daily.      lisinopril (PRINIVIL,ZESTRIL) 5 MG tablet Take 5 mg by mouth daily.       !! - Potential duplicate medications found. Please discuss with provider.     Follow-up Information    Follow up with David Stall, MD. Make an appointment in 7 days. (You need to see Dr. Fransico Michael this week)    Contact information:   [address] [place] [address] [place] [address] [phone]          Signed: Nelda Bucks 05/02/2011, 9:17 AM

## 2011-05-03 LAB — GC/CHLAMYDIA PROBE AMP, URINE: Chlamydia, Swab/Urine, PCR: NEGATIVE

## 2011-06-05 ENCOUNTER — Encounter: Payer: Self-pay | Admitting: "Endocrinology

## 2011-06-05 DIAGNOSIS — Q897 Multiple congenital malformations, not elsewhere classified: Secondary | ICD-10-CM | POA: Insufficient documentation

## 2011-06-05 DIAGNOSIS — I10 Essential (primary) hypertension: Secondary | ICD-10-CM | POA: Insufficient documentation

## 2011-06-05 DIAGNOSIS — E049 Nontoxic goiter, unspecified: Secondary | ICD-10-CM | POA: Insufficient documentation

## 2011-06-05 DIAGNOSIS — H543 Unqualified visual loss, both eyes: Secondary | ICD-10-CM | POA: Insufficient documentation

## 2011-06-05 DIAGNOSIS — F79 Unspecified intellectual disabilities: Secondary | ICD-10-CM | POA: Insufficient documentation

## 2011-06-05 DIAGNOSIS — E1151 Type 2 diabetes mellitus with diabetic peripheral angiopathy without gangrene: Secondary | ICD-10-CM | POA: Insufficient documentation

## 2011-06-05 DIAGNOSIS — E063 Autoimmune thyroiditis: Secondary | ICD-10-CM | POA: Insufficient documentation

## 2011-06-05 DIAGNOSIS — E11649 Type 2 diabetes mellitus with hypoglycemia without coma: Secondary | ICD-10-CM | POA: Insufficient documentation

## 2011-06-05 DIAGNOSIS — E1142 Type 2 diabetes mellitus with diabetic polyneuropathy: Secondary | ICD-10-CM | POA: Insufficient documentation
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
## 2011-06-09 ENCOUNTER — Ambulatory Visit: Payer: Medicaid Other | Admitting: "Endocrinology

## 2011-08-06 NOTE — ED Provider Notes (Signed)
Patient seen and assessed.  Please see my other note.  History/physical exam/procedure(s) were performed by non-physician practitioner and as supervising physician I was immediately available for consultation/collaboration. I have reviewed all notes and am in agreement with care and plan.   Hilario Quarry, MD 08/06/11 [phone]

## 2011-08-21 ENCOUNTER — Other Ambulatory Visit: Payer: Self-pay | Admitting: "Endocrinology

## 2011-09-14 ENCOUNTER — Emergency Department (HOSPITAL_COMMUNITY)
Admission: EM | Admit: 2011-09-14 | Discharge: 2011-09-14 | Disposition: A | Discharge status: Home / Self Care | Payer: Medicare Other | Attending: Emergency Medicine | Admitting: Emergency Medicine

## 2011-09-14 ENCOUNTER — Encounter (HOSPITAL_COMMUNITY): Payer: Self-pay | Admitting: *Deleted

## 2011-09-14 DIAGNOSIS — I10 Essential (primary) hypertension: Secondary | ICD-10-CM | POA: Insufficient documentation

## 2011-09-14 DIAGNOSIS — IMO0002 Reserved for concepts with insufficient information to code with codable children: Secondary | ICD-10-CM

## 2011-09-14 DIAGNOSIS — E1029 Type 1 diabetes mellitus with other diabetic kidney complication: Secondary | ICD-10-CM | POA: Diagnosis not present

## 2011-09-14 DIAGNOSIS — E1065 Type 1 diabetes mellitus with hyperglycemia: Secondary | ICD-10-CM | POA: Insufficient documentation

## 2011-09-14 DIAGNOSIS — E1142 Type 2 diabetes mellitus with diabetic polyneuropathy: Secondary | ICD-10-CM | POA: Diagnosis not present

## 2011-09-14 DIAGNOSIS — R112 Nausea with vomiting, unspecified: Secondary | ICD-10-CM | POA: Diagnosis not present

## 2011-09-14 DIAGNOSIS — E039 Hypothyroidism, unspecified: Secondary | ICD-10-CM | POA: Insufficient documentation

## 2011-09-14 DIAGNOSIS — E1049 Type 1 diabetes mellitus with other diabetic neurological complication: Secondary | ICD-10-CM | POA: Diagnosis not present

## 2011-09-14 DIAGNOSIS — Z794 Long term (current) use of insulin: Secondary | ICD-10-CM | POA: Insufficient documentation

## 2011-09-14 DIAGNOSIS — N058 Unspecified nephritic syndrome with other morphologic changes: Secondary | ICD-10-CM | POA: Diagnosis not present

## 2011-09-14 DIAGNOSIS — F79 Unspecified intellectual disabilities: Secondary | ICD-10-CM | POA: Diagnosis not present

## 2011-09-14 DIAGNOSIS — IMO0001 Reserved for inherently not codable concepts without codable children: Secondary | ICD-10-CM | POA: Diagnosis not present

## 2011-09-14 DIAGNOSIS — E1165 Type 2 diabetes mellitus with hyperglycemia: Secondary | ICD-10-CM

## 2011-09-14 LAB — URINALYSIS, ROUTINE W REFLEX MICROSCOPIC
Bilirubin Urine: NEGATIVE
Glucose, UA: >1000 mg/dL — AB
Hgb urine dipstick: NEGATIVE
Ketones, ur: 15 mg/dL — AB
Leukocytes, UA: NEGATIVE
Nitrite: NEGATIVE
Protein, ur: 30 mg/dL — AB
Specific Gravity, Urine: >1.046 — ABNORMAL HIGH (ref 1.005–1.030)
Urobilinogen, UA: 0.2 mg/dL (ref 0.0–1.0)
pH: 6.5 (ref 5.0–8.0)

## 2011-09-14 LAB — BASIC METABOLIC PANEL
BUN: 13 mg/dL (ref 6–23)
CO2: 27 mEq/L (ref 19–32)
Calcium: 9.3 mg/dL (ref 8.4–10.5)
Chloride: 98 mEq/L (ref 96–112)
Creatinine, Ser: 0.73 mg/dL (ref 0.50–1.35)
GFR calc Af Amer: >90 mL/min (ref >90)
GFR calc non Af Amer: >90 mL/min (ref >90)
Glucose, Bld: 384 mg/dL — ABNORMAL HIGH (ref 70–99)
Potassium: 4 mEq/L (ref 3.5–5.1)
Sodium: 136 mEq/L (ref 135–145)

## 2011-09-14 LAB — GLUCOSE, CAPILLARY
Glucose-Capillary: 330 mg/dL — ABNORMAL HIGH (ref 70–99)
Glucose-Capillary: 233 mg/dL — ABNORMAL HIGH (ref 70–99)

## 2011-09-14 LAB — CBC
HCT: 44.6 % (ref 39.0–52.0)
Hemoglobin: 16 g/dL (ref 13.0–17.0)
MCH: 33.5 pg (ref 26.0–34.0)
MCHC: 35.9 g/dL (ref 30.0–36.0)
MCV: 93.3 fL (ref 78.0–100.0)
Platelets: 185 10*3/uL (ref 150–400)
RBC: 4.78 MIL/uL (ref 4.22–5.81)
RDW: 12.2 % (ref 11.5–15.5)
WBC: 9.3 10*3/uL (ref 4.0–10.5)

## 2011-09-14 LAB — URINE MICROSCOPIC-ADD ON

## 2011-09-14 MED ORDER — SODIUM CHLORIDE 0.9 % IV BOLUS (SEPSIS)
1000.0000 mL | Freq: Once | INTRAVENOUS | Status: AC
  Administered 2011-09-14: 1000 mL via INTRAVENOUS

## 2011-09-14 MED ORDER — ONDANSETRON HCL 4 MG PO TABS: 4.0000 mg | ORAL_TABLET | Freq: Four times a day (QID) | ORAL | Status: AC

## 2011-09-14 MED ORDER — ONDANSETRON HCL 4 MG/2ML IJ SOLN
INTRAMUSCULAR | Status: AC
  Filled 2011-09-14: qty 2

## 2011-09-14 MED ORDER — ONDANSETRON 8 MG/NS 50 ML IVPB
8.0000 mg | Freq: Once | INTRAVENOUS | Status: AC
  Administered 2011-09-14: 8 mg via INTRAVENOUS
  Filled 2011-09-14: qty 8

## 2011-09-14 MED ORDER — INSULIN ASPART 100 UNIT/ML ~~LOC~~ SOLN
10.0000 [IU] | Freq: Once | SUBCUTANEOUS | Status: AC
  Administered 2011-09-14: 10 [IU] via INTRAVENOUS
  Filled 2011-09-14: qty 1

## 2011-09-14 NOTE — Discharge Instructions (Signed)
Blood Sugar Monitoring, Adult GLUCOSE METERS FOR SELF-MONITORING OF BLOOD GLUCOSE  It is important to be able to correctly measure your blood sugar (glucose). You can use a blood glucose monitor (a small battery-operated device) to check your glucose level at any time. This allows you and your caregiver to monitor your diabetes and to determine how well your treatment plan is working. The process of monitoring your blood glucose with a glucose meter is called self-monitoring of blood glucose (SMBG). When people with diabetes control their blood sugar, they have better health. To test for glucose with a typical glucose meter, place the disposable strip in the meter. Then place a small sample of blood on the "test strip." The test strip is coated with chemicals that combine with glucose in blood. The meter measures how much glucose is present. The meter displays the glucose level as a number. Several new models can record and store a number of test results. Some models can connect to personal computers to store test results or print them out.  Newer meters are often easier to use than older models. Some meters allow you to get blood from places other than your fingertip. Some new models have automatic timing, error codes, signals, or barcode readers to help with proper adjustment (calibration). Some meters have a large display screen or spoken instructions for people with visual impairments.  INSTRUCTIONS FOR USING GLUCOSE METERS  Wash your hands with soap and warm water, or clean the area with alcohol. Dry your hands completely.   Prick the side of your fingertip with a lancet (a sharp-pointed tool used by hand).   Hold the hand down and gently milk the finger until a small drop of blood appears. Catch the blood with the test strip.   Follow the instructions for inserting the test strip and using the SMBG meter. Most meters require the meter to be turned on and the test strip to be inserted before  applying the blood sample.   Record the test result.   Read the instructions carefully for both the meter and the test strips that go with it. Meter instructions are found in the user manual. Keep this manual to help you solve any problems that may arise. Many meters use "error codes" when there is a problem with the meter, the test strip, or the blood sample on the strip. You will need the manual to understand these error codes and fix the problem.   New devices are available such as laser lancets and meters that can test blood taken from "alternative sites" of the body, other than fingertips. However, you should use standard fingertip testing if your glucose changes rapidly. Also, use standard testing if:   You have eaten, exercised, or taken insulin in the past 2 hours.   You think your glucose is low.   You tend to not feel symptoms of low blood glucose (hypoglycemia).   You are ill or under stress.   Clean the meter as directed by the manufacturer.   Test the meter for accuracy as directed by the manufacturer.   Take your meter with you to your caregiver's office. This way, you can test your glucose in front of your caregiver to make sure you are using the meter correctly. Your caregiver can also take a sample of blood to test using a routine lab method. If values on the glucose meter are close to the lab results, you and your caregiver will see that your meter is working well  and you are using good technique. Your caregiver will advise you about what to do if the results do not match.  FREQUENCY OF TESTING  Your caregiver will tell you how often you should check your blood glucose. This will depend on your type of diabetes, your current level of diabetes control, and your types of medicines. The following are general guidelines, but your care plan may be different. Record all your readings and the time of day you took them for review with your caregiver.   Diabetes type 1.   When you  are using insulin with good diabetic control (either multiple daily injections or via a pump), you should check your glucose 4 times a day.   If your diabetes is not well controlled, you may need to monitor more frequently, including before meals and 2 hours after meals, at bedtime, and occasionally between 2 a.m. and 3 a.m.   You should always check your glucose before a dose of insulin or before changing the rate on your insulin pump.   Diabetes type 2.   Guidelines for SMBG in diabetes type 2 are not as well defined.   If you are on insulin, follow the guidelines above.   If you are on medicines, but not insulin, and your glucose is not well controlled, you should test at least twice daily.   If you are not on insulin, and your diabetes is controlled with medicines or diet alone, you should test at least once daily, usually before breakfast.   A weekly profile will help your caregiver advise you on your care plan. The week before your visit, check your glucose before a meal and 2 hours after a meal at least daily. You may want to test before and after a different meal each day so you and your caregiver can tell how well controlled your blood sugars are throughout the course of a 24 hour period.   Gestational diabetes (diabetes during pregnancy).   Frequent testing is often necessary. Accurate timing is important.   If you are not on insulin, check your glucose 4 times a day. Check it before breakfast and 1 hour after the start of each meal.   If you are on insulin, check your glucose 6 times a day. Check it before each meal and 1 hour after the first bite of each meal.   General guidelines.   More frequent testing is required at the start of insulin treatment. Your caregiver will instruct you.   Test your glucose any time you suspect you have low blood sugar (hypoglycemia).   You should test more often when you change medicines, when you have unusual stress or illness, or in other  unusual circumstances.  OTHER THINGS TO KNOW ABOUT GLUCOSE METERS  Measurement Range. Most glucose meters are able to read glucose levels over a broad range of values from as low as 0 to as high as 600 mg/dL. If you get an extremely high or low reading from your meter, you should first confirm it with another reading. Report very high or very low readings to your caregiver.   Whole Blood Glucose versus Plasma Glucose. Some older home glucose meters measure glucose in your whole blood. In a lab or when using some newer home glucose meters, the glucose is measured in your plasma (one component of blood). The difference can be important. It is important for you and your caregiver to know whether your meter gives its results as "whole blood equivalent" or "plasma  equivalent."   Display of High and Low Glucose Values. Part of learning how to operate a meter is understanding what the meter results mean. Know how high and low glucose concentrations are displayed on your meter.   Factors that Affect Glucose Meter Performance. The accuracy of your test results depends on many factors and varies depending on the brand and type of meter. These factors include:   Low red blood cell count (anemia).   Substances in your blood (such as uric acid, vitamin C, and others).   Environmental factors (temperature, humidity, altitude).   Name-brand versus generic test strips.   Calibration. Make sure your meter is set up properly. It is a good idea to do a calibration test with a control solution recommended by the manufacturer of your meter whenever you begin using a fresh bottle of test strips. This will help verify the accuracy of your meter.   Improperly stored, expired, or defective test strips. Keep your strips in a dry place with the lid on.   Soiled meter.   Inadequate blood sample.  NEW TECHNOLOGIES FOR GLUCOSE TESTING Alternative site testing Some glucose meters allow testing blood from alternative  sites. These include the:  Upper arm.   Forearm.   Base of the thumb.   Thigh.  Sampling blood from alternative sites may be desirable. However, it may have some limitations. Blood in the fingertips show changes in glucose levels more quickly than blood in other parts of the body. This means that alternative site test results may be different from fingertip test results, not because of the meter's ability to test accurately, but because the actual glucose concentration can be different.  Continuous Glucose Monitoring Devices to measure your blood glucose continuously are available, and others are in development. These methods can be more expensive than self-monitoring with a glucose meter. However, it is uncertain how effective and reliable these devices are. Your caregiver will advise you if this approach makes sense for you. IF BLOOD SUGARS ARE CONTROLLED, PEOPLE WITH DIABETES REMAIN HEALTHIER.  SMBG is an important part of the treatment plan of patients with diabetes mellitus. Below are reasons for using SMBG:   It confirms that your glucose is at a specific, healthy level.   It detects hypoglycemia and severe hyperglycemia.   It allows you and your caregiver to make adjustments in response to changes in lifestyle for individuals requiring medicine.   It determines the need for starting insulin therapy in temporary diabetes that happens during pregnancy (gestational diabetes).  Document Released: 05/13/2003 Document Revised: 04/29/2011 Document Reviewed: 09/03/2010 [facility] Patient Information 2012 [place], [place].Nausea and Vomiting Nausea means you feel sick to your stomach. Throwing up (vomiting) is a reflex where stomach contents come out of your mouth. HOME CARE   Take medicine as told by your doctor.   Do not force yourself to eat. However, you do need to drink fluids.   If you feel like eating, eat a normal diet as told by your doctor.   Eat rice, wheat, potatoes, bread,  lean meats, yogurt, fruits, and vegetables.   Avoid high-fat foods.   Drink enough fluids to keep your pee (urine) clear or pale yellow.   Ask your doctor how to replace body fluid losses (rehydrate). Signs of body fluid loss (dehydration) include:   Feeling very thirsty.   Dry lips and mouth.   Feeling dizzy.   Dark pee.   Peeing less than normal.   Feeling confused.   Fast breathing or  heart rate.  GET HELP RIGHT AWAY IF:   You have blood in your throw up.   You have black or bloody poop (stool).   You have a bad headache or stiff neck.   You feel confused.   You have bad belly (abdominal) pain.   You have chest pain or trouble breathing.   You do not pee at least once every 8 hours.   You have cold, clammy skin.   You keep throwing up after 24 to 48 hours.   You have a fever.  MAKE SURE YOU:   Understand these instructions.   Will watch your condition.   Will get help right away if you are not doing well or get worse.  Document Released: 10/27/2007 Document Revised: 04/29/2011 Document Reviewed: 10/09/2010 [facility] Patient Information 2012 [place], [place].  RESOURCE GUIDE  Dental Problems  Patients with Medicaid: [facility] [phone] [address]                                           [phone] [address] Phone:  [phone]                                                  Phone:  [phone]  If unable to pay or uninsured, contact:  [facility] or [facility] to become qualified for the adult dental clinic.  Chronic Pain Problems Contact [facility]  [phone] Patients need to be referred by their primary care doctor.  Insufficient Money for Medicine Contact [employer]:  call "211" or [facility] 4455453140.  No Primary Care Doctor Call [facility]  [phone] Other agencies that provide inexpensive medical care    [facility]   [phone]    [facility]  [phone]    [facility]  [phone]    [facility]  [phone]    [facility]  [phone]    [facility]  [phone]  Psychological Services [facility]  [phone] [facility]  [phone] [facility]   [phone] (emergency services [phone])  Substance Abuse Resources [facility]  [phone] [facility] [phone] [address] [place] [phone] Floydene Flock [phone] Residential & Outpatient Substance Abuse Program  [phone]  Abuse/Neglect [facility] Child Abuse Hotline ([phone] [facility] Child Abuse Hotline 8633369876 (After Hours)  Emergency Shelter [facility] [phone]  M[facility] [phone] [facility] [phone]  MRSA Hotline #:   514-149-7895    [facility] Resources  [facility]     [employer]                          [facility] [address] [place]                       [address]      [address]  [address]  Sela Hua Phone:  [phone]                                   Phone:  [phone]                 Phone:  [place] Phone:  [place] [phone] (After Hours)

## 2011-09-14 NOTE — ED Notes (Signed)
Notified pharmacy for zofran

## 2011-09-14 NOTE — ED Notes (Signed)
Pt began vomitting today and was noted to have CBG of 353 at home.

## 2011-09-14 NOTE — ED Notes (Signed)
Pt completed 1st bolus

## 2011-09-14 NOTE — ED Notes (Signed)
cbg 233 

## 2011-09-19 NOTE — ED Provider Notes (Signed)
History    25yM with vomiting. Onset today. Mild crampy abdominal pain. Diffuse. Does not localize. No fever or chills. No diarrhea. No urinary complaints. No sick contacts. Blood sugars in 300s at home. Reports compliance with meds.  CSN: [id number]  Arrival date & time 09/14/11  1836   First MD Initiated Contact with Patient 09/14/11 1854      Chief Complaint  Patient presents with  . Emesis  . Hyperglycemia    (Consider location/radiation/quality/duration/timing/severity/associated sxs/prior treatment) HPI  Past Medical History  Diagnosis Date  . Diabetes mellitus     Type 1, diagnosed age 59, with frequent DKA admissions, difficult to control due to MR  . Hypothyroidism   . Diabetic nephropathy     Started on Lisinopril 5 mg  . 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 Impaired cognition     mention of mental retardation  . Goiter   . Moderate or severe vision impairment, both eyes, impairment level not further specified   . Mental retardation   . Hypoglycemia associated with diabetes   . Hypertension   . Thyroiditis, autoimmune   . Angiopathy, diabetic   . Diabetic peripheral neuropathy   . Dysmorphic features     History reviewed. No pertinent past surgical history.  Family History  Problem Relation Age of Onset  . Vision loss Father   . Diabetes Maternal Grandmother     AODM  . Diabetes Paternal Grandmother     AODM  . Diabetes Cousin     Second cousin has juvenile-onset DM.  Marland Kitchen Retinitis pigmentosa Mother     History  Substance Use Topics  . Smoking status: Never Smoker   . Smokeless tobacco: Not on file  . Alcohol Use: Yes     Rarely consumes alcohol, perhaps once or twice per year.      Review of Systems   Review of symptoms negative unless otherwise noted in HPI.   Allergies  Review of patient's allergies indicates no known allergies.  Home Medications   Current Outpatient Rx  Name Route Sig Dispense Refill  .  INSULIN ASPART PROT & ASPART (70-30) 100 UNIT/ML [place] SUSP Subcutaneous Inject 28-30 Units into the skin 2 (two) times daily with a meal. Takes 30 units in the morning & 28 units in the evening    . LEVOTHYROXINE SODIUM 25 MCG PO TABS Oral Take 25 mcg by mouth daily.     Marland Kitchen LISINOPRIL 5 MG PO TABS Oral Take 5 mg by mouth daily.      Marland Kitchen ONDANSETRON HCL 4 MG PO TABS Oral Take 1 tablet (4 mg total) by mouth every 6 (six) hours. 12 tablet 0    BP 100/62  Pulse 116  Temp(Src) 97 F (36.1 C) (Oral)  Resp 25  SpO2 98%  Physical Exam  Nursing note and vitals reviewed. Constitutional: He appears well-developed and well-nourished. No distress.  HENT:  Head: Normocephalic and atraumatic.  Eyes: Conjunctivae are normal. Right eye exhibits no discharge. Left eye exhibits no discharge.  Neck: Neck supple.  Cardiovascular: Normal rate, regular rhythm and normal heart sounds.  Exam reveals no gallop and no friction rub.   No murmur heard. Pulmonary/Chest: Effort normal and breath sounds normal. No respiratory distress.  Abdominal: Soft. He exhibits no distension. There is no tenderness.       Soft. No distension. No tenderness. No mass.  Genitourinary:       No cva tenderness  Musculoskeletal: He  exhibits no edema and no tenderness.  Neurological: He is alert.  Skin: Skin is warm and dry. He is not diaphoretic.  Psychiatric: He has a normal mood and affect. His behavior is normal. Thought content normal.    ED Course  Procedures (including critical care time)  Labs Reviewed  GLUCOSE, CAPILLARY - Abnormal; Notable for the following:    Glucose-Capillary 325 (*)    All other components within normal limits  BASIC METABOLIC PANEL - Abnormal; Notable for the following:    Glucose, Bld 384 (*)    All other components within normal limits  URINALYSIS, ROUTINE W REFLEX MICROSCOPIC - Abnormal; Notable for the following:    Specific Gravity, Urine >1.046 (*)    Glucose, UA >1000 (*)    Ketones, ur  15 (*)    Protein, ur 30 (*)    All other components within normal limits  GLUCOSE, CAPILLARY - Abnormal; Notable for the following:    Glucose-Capillary 330 (*)    All other components within normal limits  GLUCOSE, CAPILLARY - Abnormal; Notable for the following:    Glucose-Capillary 233 (*)    All other components within normal limits  CBC  URINE MICROSCOPIC-ADD ON  LAB REPORT - SCANNED   No results found.   1. Nausea and vomiting   2. Uncontrolled diabetes mellitus       MDM  25yM with vomiting and hyperglycemia. No evidence of DKA. Abdominal exam benign. Afebrile. Suspect viral illness. Improved symptoms with IVF and zofran. Feel safe for DC at this time. Return precautions discussed. outpt fu otherwise.        Raeford Razor, MD 09/19/11 [phone]

## 2012-03-23 ENCOUNTER — Other Ambulatory Visit: Payer: Self-pay | Admitting: "Endocrinology

## 2012-03-23 DIAGNOSIS — E109 Type 1 diabetes mellitus without complications: Secondary | ICD-10-CM

## 2012-03-23 NOTE — Telephone Encounter (Signed)
1. Mother called to ask for refills of Novolog Mix 70/30 insulin pens. Dose 28 units in AM and 20 units in PM. He has a FU appointment with me in December. 2. I called [facility], [phone], and spoke with Mat. Disp one 5 pack per month, 6 refills Luwanna Brossman J

## 2012-04-03 ENCOUNTER — Other Ambulatory Visit: Payer: Self-pay | Admitting: *Deleted

## 2012-04-03 DIAGNOSIS — E1065 Type 1 diabetes mellitus with hyperglycemia: Secondary | ICD-10-CM

## 2012-04-03 DIAGNOSIS — IMO0002 Reserved for concepts with insufficient information to code with codable children: Secondary | ICD-10-CM

## 2012-05-10 ENCOUNTER — Encounter (HOSPITAL_COMMUNITY): Payer: Self-pay

## 2012-05-10 ENCOUNTER — Inpatient Hospital Stay (HOSPITAL_COMMUNITY)
Admission: EM | Admit: 2012-05-10 | Discharge: 2012-05-12 | DRG: 639 | Disposition: A | Discharge status: Home / Self Care | Payer: Medicare Other | Attending: Internal Medicine | Admitting: Internal Medicine

## 2012-05-10 DIAGNOSIS — Z23 Encounter for immunization: Secondary | ICD-10-CM

## 2012-05-10 DIAGNOSIS — F79 Unspecified intellectual disabilities: Secondary | ICD-10-CM | POA: Diagnosis present

## 2012-05-10 DIAGNOSIS — E1049 Type 1 diabetes mellitus with other diabetic neurological complication: Secondary | ICD-10-CM | POA: Diagnosis present

## 2012-05-10 DIAGNOSIS — N058 Unspecified nephritic syndrome with other morphologic changes: Secondary | ICD-10-CM | POA: Diagnosis present

## 2012-05-10 DIAGNOSIS — F70 Mild intellectual disabilities: Secondary | ICD-10-CM | POA: Diagnosis present

## 2012-05-10 DIAGNOSIS — E1059 Type 1 diabetes mellitus with other circulatory complications: Secondary | ICD-10-CM | POA: Diagnosis present

## 2012-05-10 DIAGNOSIS — Z833 Family history of diabetes mellitus: Secondary | ICD-10-CM

## 2012-05-10 DIAGNOSIS — E1142 Type 2 diabetes mellitus with diabetic polyneuropathy: Secondary | ICD-10-CM | POA: Diagnosis present

## 2012-05-10 DIAGNOSIS — Z9119 Patient's noncompliance with other medical treatment and regimen: Secondary | ICD-10-CM

## 2012-05-10 DIAGNOSIS — IMO0002 Reserved for concepts with insufficient information to code with codable children: Secondary | ICD-10-CM | POA: Diagnosis present

## 2012-05-10 DIAGNOSIS — E1110 Type 2 diabetes mellitus with ketoacidosis without coma: Secondary | ICD-10-CM | POA: Diagnosis present

## 2012-05-10 DIAGNOSIS — Z794 Long term (current) use of insulin: Secondary | ICD-10-CM | POA: Diagnosis not present

## 2012-05-10 DIAGNOSIS — Z91199 Patient's noncompliance with other medical treatment and regimen due to unspecified reason: Secondary | ICD-10-CM | POA: Diagnosis not present

## 2012-05-10 DIAGNOSIS — I10 Essential (primary) hypertension: Secondary | ICD-10-CM | POA: Diagnosis present

## 2012-05-10 DIAGNOSIS — I798 Other disorders of arteries, arterioles and capillaries in diseases classified elsewhere: Secondary | ICD-10-CM | POA: Diagnosis present

## 2012-05-10 DIAGNOSIS — H548 Legal blindness, as defined in USA: Secondary | ICD-10-CM | POA: Diagnosis present

## 2012-05-10 DIAGNOSIS — E039 Hypothyroidism, unspecified: Secondary | ICD-10-CM | POA: Diagnosis present

## 2012-05-10 DIAGNOSIS — R42 Dizziness and giddiness: Secondary | ICD-10-CM | POA: Diagnosis not present

## 2012-05-10 DIAGNOSIS — E1010 Type 1 diabetes mellitus with ketoacidosis without coma: Secondary | ICD-10-CM | POA: Diagnosis not present

## 2012-05-10 DIAGNOSIS — E875 Hyperkalemia: Secondary | ICD-10-CM | POA: Diagnosis not present

## 2012-05-10 DIAGNOSIS — E1065 Type 1 diabetes mellitus with hyperglycemia: Secondary | ICD-10-CM | POA: Diagnosis present

## 2012-05-10 DIAGNOSIS — E1029 Type 1 diabetes mellitus with other diabetic kidney complication: Secondary | ICD-10-CM | POA: Diagnosis present

## 2012-05-10 LAB — CBC WITH DIFFERENTIAL/PLATELET
Basophils Relative: 0 % (ref 0–1)
Hemoglobin: 15.7 g/dL (ref 13.0–17.0)
Lymphs Abs: 1.5 10*3/uL (ref 0.7–4.0)
Monocytes Relative: 3 % (ref 3–12)
Neutro Abs: 6.1 10*3/uL (ref 1.7–7.7)
Neutrophils Relative %: 77 % (ref 43–77)
RBC: 4.91 MIL/uL (ref 4.22–5.81)

## 2012-05-10 LAB — CBC
HCT: 44.7 % (ref 39.0–52.0)
MCHC: 34.7 g/dL (ref 30.0–36.0)
MCV: 91.8 fL (ref 78.0–100.0)
Platelets: 211 10*3/uL (ref 150–400)
RDW: 13 % (ref 11.5–15.5)
WBC: 8.7 10*3/uL (ref 4.0–10.5)

## 2012-05-10 LAB — BASIC METABOLIC PANEL
GFR calc Af Amer: >90 mL/min (ref >90)
GFR calc non Af Amer: >90 mL/min (ref >90)
Glucose, Bld: 247 mg/dL — ABNORMAL HIGH (ref 70–99)
Potassium: 4 mEq/L (ref 3.5–5.1)
Sodium: 136 mEq/L (ref 135–145)

## 2012-05-10 LAB — COMPREHENSIVE METABOLIC PANEL
ALT: 12 U/L (ref 0–53)
Albumin: 3.8 g/dL (ref 3.5–5.2)
Alkaline Phosphatase: 104 U/L (ref 39–117)
BUN: 17 mg/dL (ref 6–23)
Chloride: 98 mEq/L (ref 96–112)
Potassium: 5.5 mEq/L — ABNORMAL HIGH (ref 3.5–5.1)
Sodium: 139 mEq/L (ref 135–145)
Total Bilirubin: 0.3 mg/dL (ref 0.3–1.2)

## 2012-05-10 LAB — URINALYSIS, ROUTINE W REFLEX MICROSCOPIC
Bilirubin Urine: NEGATIVE
Glucose, UA: >1000 mg/dL — AB
Hgb urine dipstick: NEGATIVE
Ketones, ur: >80 mg/dL — AB
Nitrite: NEGATIVE
pH: 5.5 (ref 5.0–8.0)

## 2012-05-10 LAB — POCT I-STAT 3, VENOUS BLOOD GAS (G3P V)
Acid-base deficit: 16 mmol/L — ABNORMAL HIGH (ref 0.0–2.0)
Bicarbonate: 13.9 mEq/L — ABNORMAL LOW (ref 20.0–24.0)
O2 Saturation: 28 %
TCO2: 15 mmol/L (ref 0–100)
pCO2, Ven: 44.6 mmHg — ABNORMAL LOW (ref 45.0–50.0)
pO2, Ven: 25 mmHg — CL (ref 30.0–45.0)

## 2012-05-10 LAB — GLUCOSE, CAPILLARY
Glucose-Capillary: 294 mg/dL — ABNORMAL HIGH (ref 70–99)
Glucose-Capillary: 235 mg/dL — ABNORMAL HIGH (ref 70–99)
Glucose-Capillary: 231 mg/dL — ABNORMAL HIGH (ref 70–99)

## 2012-05-10 LAB — KETONES, QUALITATIVE

## 2012-05-10 MED ORDER — LEVOTHYROXINE SODIUM 25 MCG PO TABS
25.0000 ug | ORAL_TABLET | Freq: Every day | ORAL | Status: DC
  Administered 2012-05-11 – 2012-05-12 (×2): 25 ug via ORAL
  Filled 2012-05-10 (×3): qty 1

## 2012-05-10 MED ORDER — ENOXAPARIN SODIUM 40 MG/0.4ML ~~LOC~~ SOLN
40.0000 mg | SUBCUTANEOUS | Status: DC
  Administered 2012-05-10 – 2012-05-11 (×2): 40 mg via SUBCUTANEOUS
  Filled 2012-05-10 (×4): qty 0.4

## 2012-05-10 MED ORDER — SODIUM CHLORIDE 0.9 % IV SOLN
INTRAVENOUS | Status: DC
  Administered 2012-05-10: 13:00:00 via INTRAVENOUS
  Filled 2012-05-10: qty 1

## 2012-05-10 MED ORDER — INFLUENZA VIRUS VACC SPLIT PF IM SUSP
0.5000 mL | INTRAMUSCULAR | Status: AC
  Administered 2012-05-11: 0.5 mL via INTRAMUSCULAR
  Filled 2012-05-10: qty 0.5

## 2012-05-10 MED ORDER — SODIUM CHLORIDE 0.9 % IV SOLN: INTRAVENOUS | Status: DC

## 2012-05-10 MED ORDER — PNEUMOCOCCAL VAC POLYVALENT 25 MCG/0.5ML IJ INJ
0.5000 mL | INJECTION | INTRAMUSCULAR | Status: AC
  Administered 2012-05-11: 0.5 mL via INTRAMUSCULAR
  Filled 2012-05-10: qty 0.5

## 2012-05-10 MED ORDER — SODIUM CHLORIDE 0.9 % IV BOLUS (SEPSIS)
1000.0000 mL | Freq: Once | INTRAVENOUS | Status: AC
  Administered 2012-05-10: 1000 mL via INTRAVENOUS

## 2012-05-10 MED ORDER — DEXTROSE 50 % IV SOLN: 25.0000 mL | INTRAVENOUS | Status: DC | PRN

## 2012-05-10 MED ORDER — KCL IN DEXTROSE-NACL 40-5-0.45 MEQ/L-%-% IV SOLN
INTRAVENOUS | Status: DC
  Administered 2012-05-10: 250 mL via INTRAVENOUS
  Administered 2012-05-10 – 2012-05-12 (×7): via INTRAVENOUS
  Filled 2012-05-10 (×13): qty 1000

## 2012-05-10 MED ORDER — SODIUM CHLORIDE 0.9 % IV SOLN
INTRAVENOUS | Status: DC
  Administered 2012-05-10: 14:00:00 via INTRAVENOUS

## 2012-05-10 MED ORDER — DEXTROSE-NACL 5-0.45 % IV SOLN: INTRAVENOUS | Status: DC

## 2012-05-10 MED ORDER — DEXTROSE-NACL 5-0.45 % IV SOLN
INTRAVENOUS | Status: DC
  Administered 2012-05-10: 16:00:00 via INTRAVENOUS

## 2012-05-10 MED ORDER — SODIUM POLYSTYRENE SULFONATE 15 GM/60ML PO SUSP: 30.0000 g | Freq: Once | ORAL | Status: DC

## 2012-05-10 MED ORDER — SODIUM CHLORIDE 0.9 % IV SOLN
INTRAVENOUS | Status: DC
  Administered 2012-05-10: 6.8 [IU]/h via INTRAVENOUS
  Filled 2012-05-10: qty 1

## 2012-05-10 MED ORDER — INSULIN REGULAR BOLUS VIA INFUSION
0.0000 [IU] | Freq: Three times a day (TID) | INTRAVENOUS | Status: DC
  Filled 2012-05-10: qty 10

## 2012-05-10 NOTE — ED Notes (Signed)
Pt states he was dizzy when he was sitting on the side of the bed but laid back down and is feeling better. Pt mentating appropriately.

## 2012-05-10 NOTE — ED Notes (Signed)
Patient woke up feeling weak and went to bathroom and has felt weaker.

## 2012-05-10 NOTE — ED Notes (Signed)
States ran out of insuline 2 days ago, states his mom went to get more today.

## 2012-05-10 NOTE — ED Provider Notes (Signed)
History     CSN: [id number]  Arrival date & time 05/10/12  1004   First MD Initiated Contact with Patient 05/10/12 1019      Chief Complaint  Patient presents with  . Weakness    (Consider location/radiation/quality/duration/timing/severity/associated sxs/prior treatment) HPI [name] is a 26 y.o. male who presents with complaint of weakness and dizziness. Pt states he got up this morning to go to the bathroom, and states felt dizzy, like he was going to pass out. Pt reports he has not had his insulin in 2 days. He has hx of MR, poor historian. No family at bed side. Pt lives with his mother, and states his mom has not picked up his insulin yet. Pt denies fever, chills, URI symptoms, nausea, vomiting, abdominal pain. States eating and drinking well.    Past Medical History  Diagnosis Date  . Diabetes mellitus     Type 1, diagnosed age 25, with frequent DKA admissions, difficult to control due to MR  . Hypothyroidism   . Diabetic nephropathy     Started on Lisinopril 5 mg  . 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 Impaired cognition     mention of mental retardation  . Goiter   . Moderate or severe vision impairment, both eyes, impairment level not further specified   . Mental retardation   . Hypoglycemia associated with diabetes   . Hypertension   . Thyroiditis, autoimmune   . Angiopathy, diabetic   . Diabetic peripheral neuropathy   . Dysmorphic features     History reviewed. No pertinent past surgical history.  Family History  Problem Relation Age of Onset  . Vision loss Father   . Diabetes Maternal Grandmother     AODM  . Diabetes Paternal Grandmother     AODM  . Diabetes Cousin     Second cousin has juvenile-onset DM.  Marland Kitchen Retinitis pigmentosa Mother     History  Substance Use Topics  . Smoking status: Never Smoker   . Smokeless tobacco: Not on file  . Alcohol Use: No     Comment: Rarely consumes alcohol, perhaps once or  twice per year.      Review of Systems  Constitutional: Negative for fever and chills.  HENT: Negative for neck pain and neck stiffness.   Eyes: Negative for visual disturbance.  Respiratory: Negative for cough, chest tightness and shortness of breath.   Cardiovascular: Negative.   Gastrointestinal: Negative.   Genitourinary: Negative for dysuria and flank pain.  Musculoskeletal: Negative for myalgias and arthralgias.  Skin: Negative.   Neurological: Positive for dizziness, weakness and light-headedness. Negative for numbness and headaches.  Psychiatric/Behavioral: Negative.     Allergies  Review of patient's allergies indicates no known allergies.  Home Medications   Current Outpatient Rx  Name  Route  Sig  Dispense  Refill  . INSULIN ASPART PROT & ASPART (70-30) 100 UNIT/ML [place] SUSP   Subcutaneous   Inject 20-28 Units into the skin 2 (two) times daily with a meal. Injects 28 units in am and 20 units in the pm.           BP 97/53  Pulse 102  Temp 97.5 F (36.4 C) (Oral)  SpO2 100%  Physical Exam  Constitutional: He is oriented to person, place, and time. He appears well-developed and well-nourished. No distress.  HENT:  Head: Normocephalic and atraumatic.  Right Ear: External ear normal.  Left Ear: External ear normal.  Lips dry, cracked, oral mucosa dry. Poor dentition with multiple carries and gingival disease  Eyes: Conjunctivae normal are normal. Pupils are equal, round, and reactive to light.  Neck: Normal range of motion. Neck supple.  Cardiovascular: Normal rate, regular rhythm and normal heart sounds.   Pulmonary/Chest: Effort normal and breath sounds normal. No respiratory distress. He has no wheezes. He has no rales.  Abdominal: Soft. Bowel sounds are normal. He exhibits no distension. There is no tenderness. There is no rebound and no guarding.  Musculoskeletal: He exhibits no edema.  Lymphadenopathy:    He has no cervical adenopathy.   Neurological: He is alert and oriented to person, place, and time. No cranial nerve deficit. Coordination normal.  Skin: Skin is warm and dry.  Psychiatric: He has a normal mood and affect. His behavior is normal.    ED Course  Procedures (including critical care time)  11:01 AM Pt with generalized weakness, dizziness. CBG 447. No insulin for 2 days. Suspect possible DKA. Will get labs. Fluids started. Will monitor.  Filed Vitals:   05/10/12 1037  BP: 97/53  Pulse: 102  Temp: 97.5 F (36.4 C)    No results found.  1:42 PM Pt feeling slightly better. Pt appears to be in DKA, pH 7.103. Pt received 2 L of NS, glucose stablizer started, VS remain normal. He is orthostatic on exam. Will continue to rehydrate, labs pending.   Results for orders placed during the hospital encounter of 05/10/12  GLUCOSE, CAPILLARY      Component Value Range   Glucose-Capillary 447 (*) 70 - 99 mg/dL  CBC WITH DIFFERENTIAL      Component Value Range   WBC 7.9  4.0 - 10.5 K/uL   RBC 4.91  4.22 - 5.81 MIL/uL   Hemoglobin 15.7  13.0 - 17.0 g/dL   HCT 40.9  81.1 - 91.4 %   MCV 94.7  78.0 - 100.0 fL   MCH 32.0  26.0 - 34.0 pg   MCHC 33.8  30.0 - 36.0 g/dL   RDW 78.2  95.6 - 21.3 %   Platelets 212  150 - 400 K/uL   Neutrophils Relative 77  43 - 77 %   Neutro Abs 6.1  1.7 - 7.7 K/uL   Lymphocytes Relative 19  12 - 46 %   Lymphs Abs 1.5  0.7 - 4.0 K/uL   Monocytes Relative 3  3 - 12 %   Monocytes Absolute 0.2  0.1 - 1.0 K/uL   Eosinophils Relative 0  0 - 5 %   Eosinophils Absolute 0.0  0.0 - 0.7 K/uL   Basophils Relative 0  0 - 1 %   Basophils Absolute 0.0  0.0 - 0.1 K/uL  COMPREHENSIVE METABOLIC PANEL      Component Value Range   Sodium 139  135 - 145 mEq/L   Potassium 5.5 (*) 3.5 - 5.1 mEq/L   Chloride 98  96 - 112 mEq/L   CO2 14 (*) 19 - 32 mEq/L   Glucose, Bld 454 (*) 70 - 99 mg/dL   BUN 17  6 - 23 mg/dL   Creatinine, Ser 0.86  0.50 - 1.35 mg/dL   Calcium 9.2  8.4 - 57.8 mg/dL   Total  Protein 7.3  6.0 - 8.3 g/dL   Albumin 3.8  3.5 - 5.2 g/dL   AST 19  0 - 37 U/L   ALT 12  0 - 53 U/L   Alkaline Phosphatase 104  39 - 117  U/L   Total Bilirubin 0.3  0.3 - 1.2 mg/dL   GFR calc non Af Amer >90  >90 mL/min   GFR calc Af Amer >90  >90 mL/min  KETONES, QUALITATIVE      Component Value Range   Acetone, Bld MODERATE (*) NEGATIVE  URINALYSIS, ROUTINE W REFLEX MICROSCOPIC      Component Value Range   Color, Urine YELLOW  YELLOW   APPearance CLEAR  CLEAR   Specific Gravity, Urine 1.036 (*) 1.005 - 1.030   pH 5.5  5.0 - 8.0   Glucose, UA >1000 (*) NEGATIVE mg/dL   Hgb urine dipstick NEGATIVE  NEGATIVE   Bilirubin Urine NEGATIVE  NEGATIVE   Ketones, ur >80 (*) NEGATIVE mg/dL   Protein, ur 30 (*) NEGATIVE mg/dL   Urobilinogen, UA 0.2  0.0 - 1.0 mg/dL   Nitrite NEGATIVE  NEGATIVE   Leukocytes, UA NEGATIVE  NEGATIVE  POCT I-STAT 3, BLOOD GAS (G3P V)      Component Value Range   pH, Ven 7.103 (*) 7.250 - 7.300   pCO2, Ven 44.6 (*) 45.0 - 50.0 mmHg   pO2, Ven 25.0 (*) 30.0 - 45.0 mmHg   Bicarbonate 13.9 (*) 20.0 - 24.0 mEq/L   TCO2 15  0 - 100 mmol/L   O2 Saturation 28.0     Acid-base deficit 16.0 (*) 0.0 - 2.0 mmol/L   Sample type VENOUS     Comment NOTIFIED PHYSICIAN    GLUCOSE, CAPILLARY      Component Value Range   Glucose-Capillary 404 (*) 70 - 99 mg/dL  URINE MICROSCOPIC-ADD ON      Component Value Range   Squamous Epithelial / LPF RARE  RARE   WBC, UA 0-2  <3 WBC/hpf  GLUCOSE, CAPILLARY      Component Value Range   Glucose-Capillary 294 (*) 70 - 99 mg/dL   Comment 1 Notify RN     Comment 2 Documented in Chart     3:01 PM Pt feeling improved. He is a pt of Dr. Fransico Michael, endocrinology, will be admitted unassigned. Spoke with Internal med teaching service, will admit.   CRITICAL CARE Performed by: Jaynie Crumble A   Total critical care time: 30  Critical care time was exclusive of separately billable procedures and treating other patients.  Critical  care was necessary to treat or prevent imminent or life-threatening deterioration.  Critical care was time spent personally by me on the following activities: development of treatment plan with patient and/or surrogate as well as nursing, discussions with consultants, evaluation of patient's response to treatment, examination of patient, obtaining history from patient or surrogate, ordering and performing treatments and interventions, ordering and review of laboratory studies, ordering and review of radiographic studies, pulse oximetry and re-evaluation of patient's condition.    Date: 05/10/2012  Rate: 87  Rhythm: normal sinus rhythm  QRS Axis: right  Intervals: normal  ST/T Wave abnormalities: normal  Conduction Disutrbances:none  Narrative Interpretation:   Old EKG Reviewed: none available   1. DKA, type 1   2. Hyperkalemia       MDM  PT with dizziness, weakness. No insulin for 2 days. Pt has a touch of MR, very difficult to get history out of. He appears dry. Fluids, insulin drip. Pt in DKA, anion gap 27, pH 7.1 pt to be admitted to teaching service for treatment.         Lottie Mussel, PA 05/10/12 1506  Lottie Mussel, [place] 05/10/12 1537

## 2012-05-10 NOTE — ED Notes (Signed)
Pt denies feeling weak and dizzy; pt states "I feel a whole lot better."

## 2012-05-10 NOTE — ED Notes (Signed)
Pt denies chest pain, dizziness, and shortness of breath. Pt mentating appropriately.

## 2012-05-10 NOTE — ED Notes (Signed)
Blood gas venous results shown to Dr. Effie Shy

## 2012-05-10 NOTE — ED Provider Notes (Signed)
[name] is a 26 y.o. male gluteal for several days with weakness. He is a poor historian. He has no other complaints  On exam, he is alert, cooperative. Vitals reveal mild tachycardia. Abdomen is soft and nontender. He is alert and oriented to place, and person  Emergency department treatment IV fluids. IV insulin drip. Venous blood gas indicates acidosis  Plan admit for treatment of hyperkalemia, and DKA   Medical screening examination/treatment/procedure(s) were conducted as a shared visit with non-physician practitioner(s) and myself.  I personally evaluated the patient during the encounter  Flint Melter, MD 05/12/12 [phone]

## 2012-05-10 NOTE — ED Notes (Signed)
Pt's CBG  Is 235. RN notified.

## 2012-05-10 NOTE — Progress Notes (Signed)
Pt was admitted to the unit with cbg> 400. Pt was placed on glucostabilizer. Pt is alert and oriented x4. Ambulatory. From home with his parents. Denies any pain or discomfort. VS stable. Pt has no skin break down noted. Has some sores on his left forearm from bug bites per pt. Pt oriented to the unit and staff. Placed on tele. No complaints. Will cont to monitor.

## 2012-05-10 NOTE — H&P (Signed)
Hospital Admission Note Date: 05/10/2012  Patient name: [name] Medical record number: [id number] Date of birth: 05-Dec-1985 Age: 26 y.o. Gender: male PCP: Evette Georges, MD  Medical Service: Internal Medicine Teaching Service--[facility]  Attending physician: Dr. Dalphine Handing    1st Contact: Dr. Cicero Duck 2nd Contact: Dr. Everardo Beals    MVHQI:[id number] After 5 pm or weekends: 1st Contact:      Pager: [phone] 2nd Contact:      Pager: [phone]  Chief Complaint: Weakness and dizziness  History of Present Illness:[name] is a a 26 year old white male with PMH Type I DM, Hypothyroidism, mild MR, and HTN presenting with complaints of dizziness and weakness this morning when he went to the bathroom.  He says he felt like he was giong to "pass out" but he did not.  He then just went to sit down on the couch and felt better.  He has not taken his insulin for the past two days because he says he ran out and he also does not remember the last time he checked his blood sugar.  He has been admitted in the past last year for DKA with similar presentation. He denies any nausea, headaches, vomiting, diarrhea, chest pain, shortness of breath, or any urinary complaints at this time.  Blood glucose on admission: 454 with AG: 27 and >80 ketones on u/a.  In ED, 1L NS bolus x2, started on DKA protocol with insulin drip.    Meds: Current Outpatient Rx  Name  Route  Sig  Dispense  Refill  . INSULIN ASPART PROT & ASPART (70-30) 100 UNIT/ML [place] SUSP   Subcutaneous   Inject 20-28 Units into the skin 2 (two) times daily with a meal. Injects 28 units in am and 20 units in the pm.         . LEVOTHYROXINE SODIUM 25 MCG PO TABS   Oral   Take 25 mcg by mouth daily.          Allergies: Allergies as of 05/10/2012  . (No Known Allergies)   Past Medical History  Diagnosis Date  . Diabetes mellitus     Type 1, diagnosed age 52, with frequent DKA admissions, difficult to control due to MR   . Hypothyroidism   . Diabetic nephropathy     Started on Lisinopril 5 mg  . 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 Impaired cognition     mention of mental retardation  . Goiter   . Moderate or severe vision impairment, both eyes, impairment level not further specified   . Mental retardation   . Hypoglycemia associated with diabetes   . Hypertension   . Thyroiditis, autoimmune   . Angiopathy, diabetic   . Diabetic peripheral neuropathy   . Dysmorphic features    History reviewed. No pertinent past surgical history. Family History  Problem Relation Age of Onset  . Vision loss Father   . Diabetes Maternal Grandmother     AODM  . Diabetes Paternal Grandmother     AODM  . Diabetes Cousin     Second cousin has juvenile-onset DM.  Marland Kitchen Retinitis pigmentosa Mother    History   Social History  . Marital Status: Single    Spouse Name: N/A    Number of Children: N/A  . Years of Education: 12   Occupational History  .  [employer]    makes [employer] for [employer]  Social History Main Topics  . Smoking status: Never Smoker   . Smokeless tobacco: Not on file  . Alcohol Use: No     Comment: Rarely consumes alcohol, perhaps once or twice per year.  . Drug Use: No  . Sexually Active: No   Other Topics Concern  . Not on file   Social History Narrative   Lives in [place] with parents. Patient has 2 siblings a brother with a cardiac condition and a sister who is healthy.Patient is legally blind and thus cannot drive. Notable for developmental delay and illiteracy. 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   Review of Systems: Pertinent items are noted in HPI.  Physical Exam: Blood pressure 116/78, pulse 95, temperature 97.5 F (36.4 C), temperature source Oral, resp. rate 21, SpO2 100.00%. Vitals reviewed. General: resting in bed, NAD HEENT: PERRLA, EOMI, no scleral icterus.  Poor dentition Cardiac: Tachycardia, no rubs, murmurs or  gallops Pulm: clear to auscultation bilaterally, no wheezes, rales, or rhonchi Abd: soft, nontender, nondistended, BS present Ext: warm and well perfused, no pedal edema Neuro: alert and oriented X3, cranial nerves II-XII grossly intact, strength and sensation to light touch equal in bilateral upper and lower extremities  Lab results: Basic Metabolic Panel:  Basename 05/10/12 1120  NA 139  K 5.5*  CL 98  CO2 14*  GLUCOSE 454*  BUN 17  CREATININE 0.66  CALCIUM 9.2  MG --  PHOS --   Liver Function Tests:  Basename 05/10/12 1120  AST 19  ALT 12  ALKPHOS 104  BILITOT 0.3  PROT 7.3  ALBUMIN 3.8   CBC:  Basename 05/10/12 1120  WBC 7.9  NEUTROABS 6.1  HGB 15.7  HCT 46.5  MCV 94.7  PLT 212   CBG:  Basename 05/10/12 1518 05/10/12 1413 05/10/12 1259 05/10/12 1045  GLUCAP 235* 294* 404* 447*    Urinalysis:  Basename 05/10/12 1206  COLORURINE YELLOW  LABSPEC 1.036*  PHURINE 5.5  GLUCOSEU >1000*  HGBUR NEGATIVE  BILIRUBINUR NEGATIVE  KETONESUR >80*  PROTEINUR 30*  UROBILINOGEN 0.2  NITRITE NEGATIVE  LEUKOCYTESUR NEGATIVE   Other results: EKG:87bpm NSR, qtc 462  Assessment & Plan by Problem: [name] is a a 26 year old white male with PMH Type I DM, Hypothyroidism, mild MR, HTN admitted for DKA.     DKA (diabetic ketoacidoses)--has not taken insulin since Monday, 05/08/12.  Blood glucose on admission: 454, AG 27 and >80 ketones and >1000 glucose on U/A.  In ED, 1L NS bolus x2 given and started on DKA protocol with insulin drip.   -admit to tele -IVF -continue DKA protocol--glucostabilizer, on insulin drip.   -BMET Q2H  -CBG monitoring -continue to monitor -NPO -I/O   Type I (juvenile type) diabetes mellitus without mention of complication, uncontrolled--Hb A1C 12.2 04/2011.  Follows with Dr. Molli Knock (Endocrinology).  On home regimen: Novolog 70/30 28 units in AM and 20 units in PM.   -glucostabilizer -HbA1C -CBG monitoring    Hypothyroidism--on home regimen Synthroid qd.  TSH 04/2011: 4.452 -continue Synthroid home dose -f/u TSH    Mental retardation--appears mild. Completed highschool and was employed.  Lives at home with mother and sisters.  AAOX3.  Responds appropriately to questions.     Diet: NPO  Dvt Ppx: Lovenox  Dispo: Disposition is deferred at this time, awaiting improvement of current medical problems. Anticipated discharge in approximately 1-2 day(s).   The patient does have a current PCP (TODD,JEFFREY ALLEN, MD), therefore will not be requiring OPC  follow-up after discharge.   The patient does not have transportation limitations that hinder transportation to clinic appointments.  SignedDarden Palmer 05/10/2012, 4:05 PM

## 2012-05-10 NOTE — ED Notes (Signed)
Pt denies feeling dizzy or lightheaded during orthostatics.

## 2012-05-11 DIAGNOSIS — E1010 Type 1 diabetes mellitus with ketoacidosis without coma: Secondary | ICD-10-CM | POA: Diagnosis not present

## 2012-05-11 LAB — GLUCOSE, CAPILLARY
Glucose-Capillary: 148 mg/dL — ABNORMAL HIGH (ref 70–99)
Glucose-Capillary: 103 mg/dL — ABNORMAL HIGH (ref 70–99)
Glucose-Capillary: 353 mg/dL — ABNORMAL HIGH (ref 70–99)
Glucose-Capillary: 295 mg/dL — ABNORMAL HIGH (ref 70–99)
Glucose-Capillary: 297 mg/dL — ABNORMAL HIGH (ref 70–99)
Glucose-Capillary: 215 mg/dL — ABNORMAL HIGH (ref 70–99)

## 2012-05-11 LAB — BASIC METABOLIC PANEL
BUN: 11 mg/dL (ref 6–23)
BUN: 8 mg/dL (ref 6–23)
CO2: 19 mEq/L (ref 19–32)
CO2: 18 mEq/L — ABNORMAL LOW (ref 19–32)
CO2: 21 mEq/L (ref 19–32)
Calcium: 8.5 mg/dL (ref 8.4–10.5)
Calcium: 8.6 mg/dL (ref 8.4–10.5)
Calcium: 9 mg/dL (ref 8.4–10.5)
Chloride: 109 mEq/L (ref 96–112)
Chloride: 106 mEq/L (ref 96–112)
Chloride: 102 mEq/L (ref 96–112)
Creatinine, Ser: 0.55 mg/dL (ref 0.50–1.35)
Creatinine, Ser: 0.6 mg/dL (ref 0.50–1.35)
Creatinine, Ser: 0.6 mg/dL (ref 0.50–1.35)
Creatinine, Ser: 0.55 mg/dL (ref 0.50–1.35)
GFR calc Af Amer: >90 mL/min (ref >90)
GFR calc Af Amer: >90 mL/min (ref >90)
GFR calc non Af Amer: >90 mL/min (ref >90)
GFR calc non Af Amer: >90 mL/min (ref >90)
Glucose, Bld: 324 mg/dL — ABNORMAL HIGH (ref 70–99)
Glucose, Bld: 380 mg/dL — ABNORMAL HIGH (ref 70–99)
Potassium: 3.7 mEq/L (ref 3.5–5.1)
Sodium: 132 mEq/L — ABNORMAL LOW (ref 135–145)

## 2012-05-11 LAB — HEMOGLOBIN A1C: Hgb A1c MFr Bld: 14.2 % — ABNORMAL HIGH (ref <5.7)

## 2012-05-11 MED ORDER — INSULIN ASPART PROT & ASPART (70-30 MIX) 100 UNIT/ML ~~LOC~~ SUSP
20.0000 [IU] | Freq: Every day | SUBCUTANEOUS | Status: DC
  Administered 2012-05-11: 20 [IU] via SUBCUTANEOUS
  Filled 2012-05-11: qty 3

## 2012-05-11 MED ORDER — INSULIN ASPART PROT & ASPART (70-30 MIX) 100 UNIT/ML ~~LOC~~ SUSP: 20.0000 [IU] | Freq: Two times a day (BID) | SUBCUTANEOUS | Status: DC

## 2012-05-11 MED ORDER — INSULIN GLARGINE 100 UNIT/ML ~~LOC~~ SOLN
10.0000 [IU] | Freq: Once | SUBCUTANEOUS | Status: AC
  Administered 2012-05-11: 10 [IU] via SUBCUTANEOUS

## 2012-05-11 MED ORDER — INSULIN ASPART PROT & ASPART (70-30 MIX) 100 UNIT/ML ~~LOC~~ SUSP
28.0000 [IU] | Freq: Every day | SUBCUTANEOUS | Status: DC
  Administered 2012-05-12: 28 [IU] via SUBCUTANEOUS
  Filled 2012-05-11: qty 3

## 2012-05-11 MED ORDER — INSULIN ASPART 100 UNIT/ML ~~LOC~~ SOLN
0.0000 [IU] | Freq: Three times a day (TID) | SUBCUTANEOUS | Status: DC
  Administered 2012-05-11: 9 [IU] via SUBCUTANEOUS
  Administered 2012-05-11 (×2): 5 [IU] via SUBCUTANEOUS
  Administered 2012-05-12: 3 [IU] via SUBCUTANEOUS
  Administered 2012-05-12: 7 [IU] via SUBCUTANEOUS

## 2012-05-11 NOTE — H&P (Signed)
Internal Medicine Teaching Service Attending Note Date: 05/11/2012  Patient name: [name]  Medical record number: [id number]  Date of birth: 1985-09-27    CHIEF COMPLAINT  Weakness  HISTORY OF PRESENT ILLNESS  The patient, [name], is a 26 y.o. year old male, with past medical history of diabetes mellitus type 1, who comes in with the chief complaint of weakness and dizziness . I have read the history documented by Dr.Kennerly and I concur with the chronology of events.  When I met with the patient today, he had been on insulin drip overnight and his gap had already closed and he had had breakfast. He felt much better.   PAST MEDICAL HISTORY   has a past medical history of Diabetes mellitus; Hypothyroidism; Diabetic nephropathy; Retinitis pigmentosa; Impaired cognition; Goiter; Moderate or severe vision impairment, both eyes, impairment level not further specified; Mental retardation; Hypoglycemia associated with diabetes; Hypertension; Thyroiditis, autoimmune; Angiopathy, diabetic; Diabetic peripheral neuropathy; and Dysmorphic features.  MEDICATIONS  Reviewed  FAMILY AND SOCIAL HISTORY   reports that he has never smoked. He does not have any smokeless tobacco history on file. He reports that he does not drink alcohol or use illicit drugs. family history includes Diabetes in his cousin, maternal grandmother, and paternal grandmother; Retinitis pigmentosa in his mother; and Vision loss in his father. Lives in [place] with parents. Patient has 2 siblings a brother with a cardiac condition and a sister who is healthy.Patient is legally blind and thus cannot drive. Notable for developmental delay and illiteracy. 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   REVIEW OF SYSTEMS   At this time most of the admission symptoms have resolved and we basically have a negative ROS Constitutional:  Denies fever, chills, diaphoresis, appetite change and fatigue.  HEENT: Denies  photophobia, eye pain, redness, hearing loss, ear pain, congestion, sore throat, rhinorrhea, sneezing, neck pain, neck stiffness and tinnitus.  Respiratory: Denies SOB, DOE, cough, chest tightness, and wheezing.  Cardiovascular: Denies chest pain, palpitations and leg swelling.  Gastrointestinal: Denies nausea, vomiting, abdominal pain, diarrhea, constipation, blood in stool.  Genitourinary: Denies dysuria, urgency, frequency, hematuria, flank pain and difficulty urinating.  Musculoskeletal: Denies myalgias, back pain, joint swelling, arthralgias and gait problem.   Skin: Denies pallor, rash and wound.  Neurological: Denies dizziness, seizures, syncope, weakness, light-headedness, numbness and headaches.   Hematological: Denies adenopathy, easy bruising, personal or family bleeding history.  Psychiatric/ Behavioral: Denies suicidal ideation, mood changes, confusion, nervousness, sleep disturbance and agitation.    SERIAL VITALS   Filed Vitals:   05/10/12 1730 05/10/12 2033 05/11/12 0413 05/11/12 0953  BP: 116/78 102/58 99/59 110/72  Pulse: 93 89 76 81  Temp: 98.3 F (36.8 C) 98.3 F (36.8 C) 98.1 F (36.7 C) 98.6 F (37 C)  TempSrc: Oral Oral Oral Oral  Resp: 20 18 18 18   Height:  5\' 5"  (1.651 m)    Weight:  115 lb 4.8 oz (52.3 kg)    SpO2: 100% 100% 100% 100%    PHYSICAL EXAM  I met with patient around 10 am today  General: Resting in bed. HEENT: PERRL, EOMI, no scleral icterus. Heart: RRR, no rubs, murmurs or gallops. Lungs: Clear to auscultation bilaterally, no wheezes, rales, or rhonchi. Abdomen: Soft, nontender, nondistended, BS present. Extremities: Warm, no pedal edema. Neuro: Alert and oriented X3, cranial nerves II-XII grossly intact,  strength and sensation to light touch equal in bilateral upper and lower extremities  LAB RESULTS   BMP TREND  Lab 05/11/12  1245 05/11/12 0840 05/11/12 0525 05/11/12 0030 05/10/12 1641  NA 132* 133* 133* 136 136  K 4.1 4.2 --  -- --  CL 102 103 106 109 103  CO2 21 18* 19 20 15*  GLUCOSE 274* 380* 324* 105* 247*  BUN 6 8 9 11 13   CREATININE 0.55 0.60 0.60 0.55 0.62  CALCIUM 9.0 8.6 8.5 8.5 8.7  MG -- -- -- -- --  PHOS -- -- -- -- --  Anion gap      9                      12                  8                     7                      18   CBC TREND  Lab 05/10/12 1641 05/10/12 1120  HGB 15.5 15.7  HCT 44.7 46.5  WBC 8.7 7.9  PLT 211 212    Urinalysis    Component Value Date/Time   COLORURINE YELLOW 05/10/2012 1206   APPEARANCEUR CLEAR 05/10/2012 1206   LABSPEC 1.036* 05/10/2012 1206   PHURINE 5.5 05/10/2012 1206   GLUCOSEU >1000* 05/10/2012 1206   HGBUR NEGATIVE 05/10/2012 1206   BILIRUBINUR NEGATIVE 05/10/2012 1206   KETONESUR >80* 05/10/2012 1206   PROTEINUR 30* 05/10/2012 1206   UROBILINOGEN 0.2 05/10/2012 1206   NITRITE NEGATIVE 05/10/2012 1206   LEUKOCYTESUR NEGATIVE 05/10/2012 1206    IMAGING RESULTS  No results found.   ASSESSMENT AND PLAN  This is a 26 y.o. year old @GENDER @ with diabetic ketoacidosis currently doing well with anion gap closed from 18 to 9 today. His bicarbonate level is up, and he is eating. His most recent blood glucose:  Lab 05/11/12 1135 05/11/12 0752 05/11/12 0416 05/11/12 0229 05/11/12 0110  GLUCAP 295* 353* 249* 162* 108*    I think we will monitor him on the present insulin regimen and we should be able to discharge him tomorrow. The main reason of his DKA was non-compliance or rather running out of his insulin supplies. There is no infection found at this time.    Other chronic issues per resident note.   Thanks, Aletta Edouard, MD 12/19/20132:20 PM

## 2012-05-11 NOTE — Progress Notes (Signed)
Subjective: [name] was seen and examined at bedside.  He claims to be feeling much better since admission and has more energy today.  He denies any N/V/D, chest pain, shortness of breath, or abdominal pain at this time.  He is tolerating his diet well without any complaints.    Objective: Vital signs in last 24 hours: Filed Vitals:   05/10/12 1730 05/10/12 2033 05/11/12 0413 05/11/12 0953  BP: 116/78 102/58 99/59 110/72  Pulse: 93 89 76 81  Temp: 98.3 F (36.8 C) 98.3 F (36.8 C) 98.1 F (36.7 C) 98.6 F (37 C)  TempSrc: Oral Oral Oral Oral  Resp: 20 18 18 18   Height:  5\' 5"  (1.651 m)    Weight:  115 lb 4.8 oz (52.3 kg)    SpO2: 100% 100% 100% 100%   Weight change:   Intake/Output Summary (Last 24 hours) at 05/11/12 1455 Last data filed at 05/11/12 1200  Gross per 24 hour  Intake 1281.67 ml  Output   2525 ml  Net -1243.33 ml   Vitals reviewed. General: resting in bed, NAD HEENT: PERRLA, EOMI, no scleral icterus Cardiac: RRR, no rubs, murmurs or gallops Pulm: clear to auscultation bilaterally, no wheezes, rales, or rhonchi Abd: soft, nontender, nondistended, BS present Ext: warm and well perfused, no pedal edema Neuro: alert and oriented X3, cranial nerves II-XII grossly intact, strength and sensation to light touch equal in bilateral upper and lower extremities  Lab Results: Basic Metabolic Panel:  Lab 05/11/12 0454 05/11/12 0840  NA 132* 133*  K 4.1 4.2  CL 102 103  CO2 21 18*  GLUCOSE 274* 380*  BUN 6 8  CREATININE 0.55 0.60  CALCIUM 9.0 8.6  MG -- --  PHOS -- --   Liver Function Tests:  Lab 05/10/12 1120  AST 19  ALT 12  ALKPHOS 104  BILITOT 0.3  PROT 7.3  ALBUMIN 3.8   CBC:  Lab 05/10/12 1641 05/10/12 1120  WBC 8.7 7.9  NEUTROABS -- 6.1  HGB 15.5 15.7  HCT 44.7 46.5  MCV 91.8 94.7  PLT 211 212   CBG:  Lab 05/11/12 1135 05/11/12 0752 05/11/12 0416 05/11/12 0229 05/11/12 0110 05/11/12 0003  GLUCAP 295* 353* 249* 162* 108* 107*    Hemoglobin A1C:  Lab 05/10/12 1641  HGBA1C 14.2*   Thyroid Function Tests:  Lab 05/10/12 1641  TSH 1.025  T4TOTAL --  FREET4 --  T3FREE --  THYROIDAB --   Urinalysis:  Lab 05/10/12 1206  COLORURINE YELLOW  LABSPEC 1.036*  PHURINE 5.5  GLUCOSEU >1000*  HGBUR NEGATIVE  BILIRUBINUR NEGATIVE  KETONESUR >80*  PROTEINUR 30*  UROBILINOGEN 0.2  NITRITE NEGATIVE  LEUKOCYTESUR NEGATIVE   Medications: I have reviewed the patient's current medications. Scheduled Meds:   . enoxaparin (LOVENOX) injection  40 mg Subcutaneous Q24H  . insulin aspart  0-9 Units Subcutaneous TID WC  . levothyroxine  25 mcg Oral QAC breakfast   Continuous Infusions:   . sodium chloride    . dextrose 5 % and 0.45 % NaCl with KCl 40 mEq/L 250 mL/hr at 05/11/12 0819   PRN Meds:.dextrose  Assessment/Plan: [name] is a a 26 year old white male with PMH Type I DM, Hypothyroidism, mild MR, HTN admitted for DKA.   DKA (diabetic ketoacidoses)--Improved.  Did not taken his insulin since Monday, 05/08/12.  Blood glucose on admission: 454, AG 27 and >80 ketones and >1000 glucose on U/A on admission. In ED, 1L NS bolus x2 given and  started on DKA protocol with insulin drip. Insulin drip discontinued overnight, AG closed 9 today.   -IVF  -am bmet -CBG monitoring  -continue to monitor   Type I (juvenile type) diabetes mellitus without mention of complication, uncontrolled--Hb A1C 12.2 04/2011. Follows with Dr. Molli Knock (Endocrinology). On home regimen: Novolog 70/30 28 units in AM and 20 units in PM. HbA1C 14.2 05/10/12.  -restart home medication Novolog 70/30 -CBG monitoring   Hypothyroidism--on home regimen Synthroid qd. TSH 05/10/2012: 1.025 -continue Synthroid home dose   Mental retardation--appears mild. Completed highschool and was employed. Lives at home with mother and sisters. AAOX3. Responds appropriately to questions.   Diet: diabetic diet Dvt Ppx: Lovenox  Dispo: likely  d/c home tomorrow The patient does have a current PCP (TODD,JEFFREY ALLEN, MD), therefore will not be requiring OPC follow-up after discharge.  The patient does not have transportation limitations that hinder transportation to clinic appointments.    LOS: 1 day   [name] 05/11/2012, 2:55 PM

## 2012-05-12 DIAGNOSIS — E1010 Type 1 diabetes mellitus with ketoacidosis without coma: Secondary | ICD-10-CM | POA: Diagnosis not present

## 2012-05-12 LAB — GLUCOSE, CAPILLARY
Glucose-Capillary: 330 mg/dL — ABNORMAL HIGH (ref 70–99)
Glucose-Capillary: 226 mg/dL — ABNORMAL HIGH (ref 70–99)

## 2012-05-12 MED ORDER — INSULIN ASPART PROT & ASPART (70-30 MIX) 100 UNIT/ML ~~LOC~~ SUSP: 20.0000 [IU] | Freq: Two times a day (BID) | SUBCUTANEOUS | Status: DC

## 2012-05-12 NOTE — Discharge Summary (Signed)
Internal Medicine Teaching [facility] Discharge Note  Name: [name] MRN: [id number] DOB: 01/13/1986 26 y.o.  Date of Admission: 05/10/2012 10:10 AM Date of Discharge: 05/12/2012 Attending Physician: Aletta Edouard, MD  Discharge Diagnosis: Principal Problem:  *DKA (diabetic ketoacidoses) Active Problems:  Type I (juvenile type) diabetes mellitus without mention of complication, uncontrolled  Mental retardation  Hypothyroidism  Discharge Medications:   Medication List     As of 05/12/2012  9:53 AM    TAKE these medications         insulin aspart protamine-insulin aspart (70-30) 100 UNIT/ML injection   Commonly known as: NOVOLOG 70/30   Inject 20-30 Units into the skin 2 (two) times daily with a meal. Injects 30 units in am and 22 units in the pm.      levothyroxine 25 MCG tablet   Commonly known as: SYNTHROID, LEVOTHROID   Take 25 mcg by mouth daily.        Disposition and follow-up:   [name] was discharged from [facility] in good condition.  At the hospital follow up visit please address adherence to insulin and evaluate for need to titrate insulin up.  Follow-up Appointments:     Follow-up Information    Follow up with David Stall, MD. On 05/23/2012. (As scheduled)    Contact information:   [address] [place] [address] [place] [address] [phone]         Discharge Orders    Future Appointments: Provider: Department: Dept Phone: Center:   05/23/2012 2:30 PM David Stall, MD [facility] [phone] [facility]     Future Orders Please Complete By Expires   Diet Carb Modified      Increase activity slowly      Discharge instructions      Comments:   Please increase morning insulin to 30 units and evening insulin to 22units before bed.  Be sure to follow up with your endocrinologist on 05/23/12 as scheduled.   Call MD for:  persistant nausea and vomiting       Call MD for:  difficulty breathing, headache or visual disturbances      Call MD for:  persistant dizziness or light-headedness      Call MD for:  extreme fatigue         Consultations:  None   Admission HPI:  [name] is a a 26 year old white male with PMH Type I DM, Hypothyroidism, mild MR, and HTN presenting with complaints of dizziness and weakness this morning when he went to the bathroom. He says he felt like he was giong to "pass out" but he did not. He then just went to sit down on the couch and felt better. He has not taken his insulin for the past two days because he says he ran out and he also does not remember the last time he checked his blood sugar. He has been admitted in the past last year for DKA with similar presentation. He denies any nausea, headaches, vomiting, diarrhea, chest pain, shortness of breath, or any urinary complaints at this time.  Blood glucose on admission: 454 with AG: 27 and >80 ketones on u/a. In ED, 1L NS bolus x2, started on DKA protocol with insulin drip.   Hospital Course by problem list: [name] is a a 26 year old white male with PMH Type I DM, Hypothyroidism, mild MR, HTN admitted for DKA on 05/10/12.   # DKA (diabetic ketoacidoses)--Patient  with h/o Type 1 Juvenile DM. Sugards imrpoved by hospital discharge. Etiology due to medication noncompliance, as patient was out of insulin since Monday, 05/08/12 until at admission on 04/30/12. Blood glucose on admission: 454, AG 27 and >80 ketones and >1000 glucose on U/A on admission. In ED, 1L NS bolus x2 given and started on DKA protocol with insulin drip. AG closed (9) by 05/11/12 and patient was able to tolerate diet.     # Type I (juvenile type) diabetes mellitus without mention of complication, uncontrolled--Hb A1C 14.2 on 05/10/2012. Follows with Dr. Molli Knock (Endocrinology).  Home regimen as follows: Novolog 70/30 28 units in AM and 20 units in PM.  Even on home regimen, CBGs~300, and  thus at discharge, we increased home medication Novolog 70/30 to 30u qAM and 22u qPM (increase of total daily dose of 4u).   During the 24h prior to discharge, patient required a total of 19u of sliding scale insulin, but since patient is type 1, and thus may be very brittle, will defer further titrating to primary endocrinologist on 05/23/12 once patient is stable at home.  # Hypothyroidism--Stable.  Patient was continued on home regimen Synthroid qd. TSH 05/10/2012: 1.025   # Mental retardation--appears mild. Completed highschool and was employed. Lives at home with mother and sisters. AAOX3. Responds appropriately to questions.   Diet: diabetic diet started during hosptalization  Dvt Ppx: Treated with lovenox during hospitalization.   Discharge Vitals:  BP 129/86  Pulse 83  Temp 98.4 F (36.9 C) (Oral)  Resp 18  Ht 5\' 5"  (1.651 m)  Wt 120 lb 5.9 oz (54.6 kg)  BMI 20.03 kg/m2  SpO2 100%  Discharge Labs:  Results for orders placed during the hospital encounter of 05/10/12 (from the past 24 hour(s))  GLUCOSE, CAPILLARY     Status: Abnormal   Collection Time   05/11/12 11:35 AM      Component Value Range   Glucose-Capillary 295 (*) 70 - 99 mg/dL  BASIC METABOLIC PANEL     Status: Abnormal   Collection Time   05/11/12 12:45 PM      Component Value Range   Sodium 132 (*) 135 - 145 mEq/L   Potassium 4.1  3.5 - 5.1 mEq/L   Chloride 102  96 - 112 mEq/L   CO2 21  19 - 32 mEq/L   Glucose, Bld 274 (*) 70 - 99 mg/dL   BUN 6  6 - 23 mg/dL   Creatinine, Ser 1.47  0.50 - 1.35 mg/dL   Calcium 9.0  8.4 - 82.9 mg/dL   GFR calc non Af Amer >90  >90 mL/min   GFR calc Af Amer >90  >90 mL/min  GLUCOSE, CAPILLARY     Status: Abnormal   Collection Time   05/11/12  4:40 PM      Component Value Range   Glucose-Capillary 297 (*) 70 - 99 mg/dL  GLUCOSE, CAPILLARY     Status: Abnormal   Collection Time   05/11/12  9:52 PM      Component Value Range   Glucose-Capillary 215 (*) 70 - 99  mg/dL  GLUCOSE, CAPILLARY     Status: Abnormal   Collection Time   05/12/12  7:59 AM      Component Value Range   Glucose-Capillary 330 (*) 70 - 99 mg/dL  GLUCOSE, CAPILLARY     Status: Abnormal   Collection Time   05/12/12  9:26 AM      Component Value Range  Glucose-Capillary 349 (*) 70 - 99 mg/dL    Signed: Vernice Jefferson 05/12/2012, 9:53 AM   Time Spent on Discharge: 25 min Services Ordered on Discharge: none Equipment Ordered on Discharge: none

## 2012-05-12 NOTE — Progress Notes (Signed)
Subjective: No acute events overnight.  Feels well. Tolerating diet. No nausea, vomiting reported. Patient knows about HFU with endocrinologist on 05/23/12.  He reports his mother picked up his insulin on the day of admission.  Objective: Vital signs in last 24 hours: Filed Vitals:   05/11/12 1400 05/11/12 1733 05/11/12 2148 05/12/12 0515  BP: 118/78 117/76 125/81 129/86  Pulse: 78 82 71 83  Temp: 97.6 F (36.4 C) 98 F (36.7 C) 98.3 F (36.8 C) 98.4 F (36.9 C)  TempSrc: Oral Oral Oral Oral  Resp: 18 18 18 18   Height:      Weight:   120 lb 5.9 oz (54.6 kg)   SpO2: 98% 98% 100% 100%   Weight change: 5 lb 1.1 oz (2.3 kg)  Intake/Output Summary (Last 24 hours) at 05/12/12 0831 Last data filed at 05/12/12 1610  Gross per 24 hour  Intake 3925.83 ml  Output   6315 ml  Net -2389.17 ml   Vitals reviewed. Afebrile.  Saturating well.  General: resting in bed HEENT: PERRL, EOMI, no scleral icterus Cardiac: RRR, no rubs, murmurs or gallops Pulm: clear to auscultation bilaterally, moving normal volumes of air Abd: soft, nontender, nondistended, BS present Ext: warm and well perfused, no pedal edema Neuro: alert and oriented X3, cranial nerves II-XII grossly intact  Lab Results: Basic Metabolic Panel:  Lab 05/11/12 9604 05/11/12 0840  NA 132* 133*  K 4.1 4.2  CL 102 103  CO2 21 18*  GLUCOSE 274* 380*  BUN 6 8  CREATININE 0.55 0.60  CALCIUM 9.0 8.6  MG -- --  PHOS -- --  AG = 9 Corrected Na = 134.8  Liver Function Tests:  Lab 05/10/12 1120  AST 19  ALT 12  ALKPHOS 104  BILITOT 0.3  PROT 7.3  ALBUMIN 3.8   CBC:  Lab 05/10/12 1641 05/10/12 1120  WBC 8.7 7.9  NEUTROABS -- 6.1  HGB 15.5 15.7  HCT 44.7 46.5  MCV 91.8 94.7  PLT 211 212   CBG:  Lab 05/12/12 0759 05/11/12 2152 05/11/12 1640 05/11/12 1135 05/11/12 0752 05/11/12 0416  GLUCAP 330* 215* 297* 295* 353* 249*   Hemoglobin A1C:  Lab 05/10/12 1641  HGBA1C 14.2*   Thyroid Function Tests:  Lab  05/10/12 1641  TSH 1.025  T4TOTAL --  FREET4 --  T3FREE --  THYROIDAB --   Urinalysis:  Lab 05/10/12 1206  COLORURINE YELLOW  LABSPEC 1.036*  PHURINE 5.5  GLUCOSEU >1000*  HGBUR NEGATIVE  BILIRUBINUR NEGATIVE  KETONESUR >80*  PROTEINUR 30*  UROBILINOGEN 0.2  NITRITE NEGATIVE  LEUKOCYTESUR NEGATIVE    Medications: I have reviewed the patient's current medications. Scheduled Meds:    . enoxaparin (LOVENOX) injection  40 mg Subcutaneous Q24H  . insulin aspart  0-9 Units Subcutaneous TID WC  . insulin aspart protamine-insulin aspart  20 Units Subcutaneous Q supper  . insulin aspart protamine-insulin aspart  28 Units Subcutaneous Q breakfast  . levothyroxine  25 mcg Oral QAC breakfast   Continuous Infusions:  PRN Meds:.dextrose Assessment/Plan: [name] is a a 26 year old white male with PMH Type I DM, Hypothyroidism, mild MR, HTN admitted for DKA on 05/10/12.   # DKA (diabetic ketoacidoses)--Patient with h/o Type 1 Juvenile DM.  Improved. Did not taken his insulin since Monday, 05/08/12 until at admission on 04/30/12. Blood glucose on admission: 454, AG 27 and >80 ketones and >1000 glucose on U/A on admission. In ED, 1L NS bolus x2 given and started on DKA  protocol with insulin drip. AG closed yesterday (9). -d/c IVF  -CBG monitoring   # Type I (juvenile type) diabetes mellitus without mention of complication, uncontrolled--Hb A1C 12.2 04/2011. Follows with Dr. Molli Knock (Endocrinology). On home regimen: Novolog 70/30 28 units in AM and 20 units in PM. HbA1C 14.2 05/10/12.  -Increase home medication Novolog 70/30 to 30u qAM and 22u qPM at discharge - patient is type 1, and thus may be very brittle so will defer further titrating to primary endocrinologist on 05/23/12 (increased TDD by 4 units) -CBG monitoring   # Hypothyroidism--on home regimen Synthroid qd. TSH 05/10/2012: 1.025  -continue Synthroid home dose   # Mental retardation--appears mild. Completed  highschool and was employed. Lives at home with mother and sisters. AAOX3. Responds appropriately to questions.   Diet: diabetic diet   Dvt Ppx: Lovenox   Dispo: likely d/c home today The patient does have a current PCP (TODD,JEFFREY ALLEN, MD), therefore will not be requiring OPC follow-up after discharge.  The patient does not have transportation limitations that hinder transportation to clinic appointments.   .Services Needed at time of discharge: Y = Yes, Blank = No PT:   OT:   RN:   Equipment:   Other:     LOS: 2 days   KAPADIA, Misael Mcgaha 05/12/2012, 8:31 AM

## 2012-05-12 NOTE — Discharge Summary (Signed)
Internal Medicine Teaching Service Attending Note Date: 05/12/2012  Patient name: [name]  Medical record number: [id number]  Date of birth: 07-16-1985    I evaluated the patient on the day of discharge and discussed the discharge plan with my resident team.  In short, the patient was admitted for DKA and stayed in the hospital for 2 days and got treatment for the same.   Today when I met the patient, he felt normal, without any abdominal pain, weakness of vomiting.  On exam, he was unchanged from yesterday. Vitals reviewed.  General: Resting in bed. Thin built.  HEENT: PERRL, EOMI, no scleral icterus. Heart: RRR, no rubs, murmurs or gallops. Lungs: Clear to auscultation bilaterally, no wheezes, rales, or rhonchi. Abdomen: Soft, nontender, nondistended, BS present. Extremities: Warm, no pedal edema. Neuro: Alert and oriented X3, grossly intact.   I have reviewed the recent labs. His blood glucose levels have been on the higher side this morning, however the most recent one is 226. He is not making any ketones nor does he have any acidosis or gap.   Lab 05/12/12 1202 05/12/12 0926 05/12/12 0759 05/11/12 2152 05/11/12 1640  GLUCAP 226* 349* 330* 215* 297*     Lab 05/11/12 1245 05/11/12 0840 05/11/12 0525 05/11/12 0030 05/10/12 1641  NA 132* 133* 133* 136 136  K 4.1 4.2 -- -- --  CL 102 103 106 109 103  CO2 21 18* 19 20 15*  GLUCOSE 274* 380* 324* 105* 247*  BUN 6 8 9 11 13   CREATININE 0.55 0.60 0.60 0.55 0.62  CALCIUM 9.0 8.6 8.5 8.5 8.7  MG -- -- -- -- --  PHOS -- -- -- -- --   We will send him home on a revised slightly elevated dosage insulin scale, and would arrange a follow up. In the meantime the patient is encouraged to closely monitor his blood sugars.   I agree with the discharge and its documentation as well as disposition of the patient. Please see resident discharge summary for a detailed review of me and my team's discharge recommendations.  Thank  you for working with me on this patient's care.   Thanks Aletta Edouard 05/12/2012, 11:39 AM

## 2012-05-12 NOTE — Progress Notes (Signed)
Patient discharged to home. Patient AVS reviewed with patient and patient's mother. Patient and patient's mother verbalized understanding of medications and follow-up appointments.  Patient remains stable; no signs or symptoms of distress.  Educated to return to the ER in cases of exacerbation of admitting diagnosis, SOB, dizziness, fever, chest pain, or fainting.   Fayne Norrie, RN

## 2012-05-13 NOTE — ED Provider Notes (Signed)
Medical screening examination/treatment/procedure(s) were performed by non-physician practitioner and as supervising physician I was immediately available for consultation/collaboration.  Sunnie Nielsen, MD 05/13/12 [phone]

## 2012-05-23 ENCOUNTER — Other Ambulatory Visit: Payer: Self-pay | Admitting: *Deleted

## 2012-05-23 ENCOUNTER — Encounter: Payer: Self-pay | Admitting: "Endocrinology

## 2012-05-23 ENCOUNTER — Ambulatory Visit (INDEPENDENT_AMBULATORY_CARE_PROVIDER_SITE_OTHER): Payer: Medicare Other | Admitting: "Endocrinology

## 2012-05-23 VITALS — BP 130/89 | HR 94 | Wt 125.5 lb

## 2012-05-23 DIAGNOSIS — IMO0002 Reserved for concepts with insufficient information to code with codable children: Secondary | ICD-10-CM

## 2012-05-23 DIAGNOSIS — E11649 Type 2 diabetes mellitus with hypoglycemia without coma: Secondary | ICD-10-CM

## 2012-05-23 DIAGNOSIS — E063 Autoimmune thyroiditis: Secondary | ICD-10-CM | POA: Diagnosis not present

## 2012-05-23 DIAGNOSIS — E1065 Type 1 diabetes mellitus with hyperglycemia: Secondary | ICD-10-CM | POA: Diagnosis not present

## 2012-05-23 DIAGNOSIS — E1169 Type 2 diabetes mellitus with other specified complication: Secondary | ICD-10-CM | POA: Diagnosis not present

## 2012-05-23 DIAGNOSIS — E038 Other specified hypothyroidism: Secondary | ICD-10-CM | POA: Diagnosis not present

## 2012-05-23 DIAGNOSIS — I10 Essential (primary) hypertension: Secondary | ICD-10-CM

## 2012-05-23 DIAGNOSIS — G909 Disorder of the autonomic nervous system, unspecified: Secondary | ICD-10-CM

## 2012-05-23 DIAGNOSIS — R000 Tachycardia, unspecified: Secondary | ICD-10-CM

## 2012-05-23 DIAGNOSIS — E1043 Type 1 diabetes mellitus with diabetic autonomic (poly)neuropathy: Secondary | ICD-10-CM

## 2012-05-23 DIAGNOSIS — E1042 Type 1 diabetes mellitus with diabetic polyneuropathy: Secondary | ICD-10-CM

## 2012-05-23 DIAGNOSIS — E049 Nontoxic goiter, unspecified: Secondary | ICD-10-CM

## 2012-05-23 DIAGNOSIS — E1049 Type 1 diabetes mellitus with other diabetic neurological complication: Secondary | ICD-10-CM

## 2012-05-23 LAB — GLUCOSE, POCT (MANUAL RESULT ENTRY): POC Glucose: 405 mg/dl — AB (ref 70–99)

## 2012-05-23 MED ORDER — LISINOPRIL 10 MG PO TABS: 10.0000 mg | ORAL_TABLET | Freq: Every day | ORAL | Status: DC

## 2012-05-23 MED ORDER — ONETOUCH VERIO VI STRP: ORAL_STRIP | Status: DC

## 2012-05-23 MED ORDER — GLUCOSE BLOOD VI STRP: ORAL_STRIP | Status: DC

## 2012-05-23 NOTE — Progress Notes (Signed)
Subjective:  Patient Name: [name] Date of Birth: 1986-03-22  MRN: [id number]  [name]  presents to the office today for follow-up of his T1DM, recurrent DKA, hypoglycemia, mental retardation, hypertension, goiter, thyroiditis, peripheral angiopathy, peripheral neuropathy, and visual impairment.   HISTORY OF PRESENT ILLNESS:   [name] is a 26 y.o. Caucasian young man.  [name] was accompanied by his aunt, [name], and his MGM, [name]. [name] is a [name] at [name].    1. The patient was diagnosed with T1DM at age 71. We have few details about his medical [name] until 2007. We know that in about 2006 he saw a pediatric endocrinologist at [name]. Unfortunately, the family chose not to return to see the endocrinologist. In about April of 2007, his primary [name] provider, Dr. Reed [name] of [name], started the patient on an insulin pump. Unfortunately, neither the patient nor the family were capable of utilizing the pump's technology effectively. On 10/02/2005 the patient was admitted to the [name] for poorly controlled type 1 diabetes. I consulted on the patient at that time. 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 He did not have the insight or intelligence to perform the daily tasks of diabetes self-[name] independently or to utilize the insulin pump. It also appeared that there was not a great deal of adult supervision and support within his family. I changed his regimen to NovoLog mix 70/30 insulin taken twice daily. He was to take 26 units each morning at breakfast and 14 units each evening at supper. I also gave him a sliding scale for Humalog lispro with different doses of insulin to be used at breakfast, lunch, supper, and bedtime in addition to his 70/30 insulin when his  family members were available to check the blood sugars and give the additional insulin. The patient's family members agreed to supervise his [name]. 2. Unfortunately, the patient has not often had the adult supervision and support that he needed. In the last 6 years, the patient has been scheduled to return to our clinic for follow up visits approximately every 3 months, but has actually only returned to our clinic for follow up visits 1-3 times per year. His hemoglobin A1c values have varied from 10.6-13.0%. He has had several more admissions to our [name] for evaluation and treatment of poorly controlled T1DM and diabetic ketoacidosis. He has had the following complications of diabetes: diabetic angiopathy, peripheral neuropathy, and occasional but significant hypoglycemia. In November 2007, the patient was diagnosed with hypothyroidism secondary to Hashimoto's disease was started on Synthroid at a dose of 25 mcg per day. In November 2008, he was diagnosed with hypertension and started on lisinopril, 5 mg per day. 3. At the time of his last clinic visit on 10/232/12, the hemoglobin A1c was >13%. Since then he has been hospitalized several more times for DKA, most recently 2 weeks ago. He is still taking Novolog 70/30 insulin, 30 units in AM and 22 units in the PM. His aunt, who loves a short distance away, is willing to help out taking [name] of him. His maternal grandmother is also willing to help. 4. Pertinent Review of Systems:  Constitutional: The patient feels "good".  Eyes: Vision is poor, but essentially stable since last visit. He is legally blind. He is overdue for his annual eye exam. Neck: The patient has no complaints of anterior neck  swelling, soreness, tenderness,  pressure, discomfort, or difficulty swallowing.  Heart: The patient has no complaints of palpitations, irregular heat beats, chest pain, or chest pressure. Gastrointestinal: Bowel movents seem normal. The patient has no complaints  of excessive hunger, acid reflux, upset stomach, stomach aches or pains, diarrhea, or constipation. Legs: Muscle mass and strength seem normal. There are no complaints of numbness, tingling, burning, or pain. No edema is noted. Feet: There are no obvious foot problems. There are no complaints of numbness, tingling, burning, or pain. No edema is noted. Hypoglycemia: He is not having hypoglycemia very often. 5. BG printout: Since discharge from the [name] he has been checking his BGs 2-5 times per day. In the 12 days prior to his most recent admission for DKA, however, he had not checked BGs for four days in a row from 1-4 days prior to admission and again from 9-12 days prior to admission.   PAST MEDICAL, FAMILY, AND SOCIAL HISTORY:  Past Medical History  Diagnosis Date  . Diabetes mellitus     Type 1, diagnosed age 83, with frequent DKA admissions, difficult to control due to MR  . Hypothyroidism   . Diabetic nephropathy     Started on Lisinopril 5 mg  . 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 Impaired cognition     mention of mental retardation  . Goiter   . Moderate or severe vision impairment, both eyes, impairment level not further specified   . Mental retardation   . Hypoglycemia associated with diabetes   . Hypertension   . Thyroiditis, autoimmune   . Angiopathy, diabetic   . Diabetic peripheral neuropathy   . Dysmorphic features     Family History  Problem Relation Age of Onset  . Vision loss Father   . Diabetes Maternal Grandmother     AODM  . Diabetes Paternal Grandmother     AODM  . Diabetes Cousin     Second cousin has juvenile-onset DM.  Marland Kitchen Retinitis pigmentosa Mother     Current outpatient prescriptions:insulin aspart protamine-insulin aspart (NOVOLOG MIX 70/30 FLEXPEN) (70-30) 100 UNIT/ML injection, Inject 20-30 Units into the skin 2 (two) times daily with a meal. Injects 30 units in am and 22 units in the pm., Disp: 10 mL, Rfl: 5;   levothyroxine (SYNTHROID, LEVOTHROID) 25 MCG tablet, Take 25 mcg by mouth daily., Disp: , Rfl:   Allergies as of 05/23/2012  . (No Known Allergies)    1. Work and Family:  He was laid off from his job at the [employer]. He is still living at home.   2. Activities: No regular physical activity 3. Smoking, alcohol, or drugs: No tobacco or alcohol or drugs.  4. Primary [name] Provider: None at present  REVIEW OF SYSTEMS: There are no other significant problems involving Mykell's other body systems.   Objective:  Vital Signs:  BP 130/89  Pulse 94  Wt 125 lb 8 oz (56.926 kg)   Ht Readings from Last 3 Encounters:  05/10/12 5\' 5"  (1.651 m)  04/30/11 5\' 5"  (1.651 m)   Wt Readings from Last 3 Encounters:  05/23/12 125 lb 8 oz (56.926 kg)  05/11/12 120 lb 5.9 oz (54.6 kg)  05/02/11 121 lb 4.1 oz (55 kg)   There is no height on file to calculate BSA.  Normalized stature-for-age data available only for age 58 to 20 years. Normalized weight-for-age data available only for age 58 to 20 years.  PHYSICAL EXAM:  Constitutional: The patient appears fairly healthy and well nourished. He is very friendly and affable today. His insight remains very poor. His thought processes are very concrete. He really does not understand or comprehend very much about diabetes, about why he needs to take [name] of himself, and about what will happen to him if he does not take [name] of himself. His understanding of nutrition is very primitive. If left unsupervised, he would never checks his blood sugar or take an insulin injection again.  Face: The face appears somewhat dysmorphic.  Eyes: There is no obvious arcus or proptosis. Moisture appears normal.  Mouth: The oropharynx and tongue appear normal. Oral moisture is normal. His oral hygiene is better. Neck: The neck appears to be visibly normal. No carotid bruits are noted. The thyroid gland is 20-25 grams in size. The left lobe is larger than the right  lobe.The consistency of the thyroid gland is relatively firm. The thyroid gland is not tender to palpation. Lungs: The lungs are clear to auscultation. Air movement is good. Heart: Heart rate and rhythm are regular. Heart sounds S1 and S2 are normal. I did not appreciate any pathologic cardiac murmurs. Abdomen: The abdomen appears to be normal in size. Bowel sounds are normal. There is no obvious hepatomegaly, splenomegaly, or other mass effect.  Arms: Muscle size and bulk are normal for age. Hands: There is no obvious tremor. Phalangeal and metacarpophalangeal joints are normal. Palmar muscles are normal. Palmar skin is normal. Palmar moisture is also normal. Legs: Muscles appear normal for age. No edema is present. Feet: Feet are normally formed, but are cool to touch. Dorsalis pedal pulses are faint 1+ bilaterally. Neurologic: Strength is normal for age in both the upper and lower extremities. Muscle tone is normal. Sensation to touch is normal in both the legs and feet.    LAB DATA: Hemoglobin A1c today was 14%. 05/10/12: Creatinine 0.62, TSH 1.025,            Assessment and Plan:   ASSESSMENT:  1. Diabetes mellitus: The patient's recent hemoglobin A1c is again very high. His BG control has been terrible, in large part because he has not had the adult support and supervision he needs. His aunt Clayborne Dana and his maternal grandmother are trying to help.  2. Hypertension: Blood pressure is worse off lisinopril.  3. Peripheral neuropathy: This is manifested more by poor circulation than by peripheral sensory neuropathy.  4. Angiopathy: Patient is still is having a problem. If he exercises on a regular basis and controls his BGs, blood flow in his legs and feet will improve. 5. Goiter: Thyroid gland is somewhat more enlarged. The waxing and waning of thyroid gland size is c/w evolving Hashimoto's disease.  6. Hypoglycemia: This has not occurred much lately. 7. Hypothyroid: His TSH in the [name]  this month was normal. 8. Autonomic neuropathy and tachycardia: These problems have improved somewhat, indicating that his BGs are better on some days.   PLAN: 1. Diagnostic: Bring in new BG meter in two weeks for download and insulin plan adjustment. 2. Therapeutic: Resume active supervision of blood sugar checks and insulin doses at breakfast and supper. Call in two weeks to discuss BG results. 3. Patient education: Please cooperate with your aunt and grandmother.  4. Follow-up: one month  Level of Service: This visit lasted in excess of 60 minutes. More than 50% of the visit was devoted to counseling.  David Stall, MD 05/23/2012 2:44 PM

## 2012-05-23 NOTE — Patient Instructions (Signed)
Follow up visit in one month. Please bring in BG meter in two weeks for download.

## 2012-05-24 DIAGNOSIS — E063 Autoimmune thyroiditis: Secondary | ICD-10-CM | POA: Insufficient documentation

## 2012-06-26 ENCOUNTER — Other Ambulatory Visit: Payer: Self-pay | Admitting: "Endocrinology

## 2012-06-28 ENCOUNTER — Encounter: Payer: Self-pay | Admitting: "Endocrinology

## 2012-06-28 ENCOUNTER — Ambulatory Visit (INDEPENDENT_AMBULATORY_CARE_PROVIDER_SITE_OTHER): Payer: Medicare Other | Admitting: "Endocrinology

## 2012-06-28 VITALS — BP 124/80 | HR 89 | Wt 130.4 lb

## 2012-06-28 DIAGNOSIS — E1042 Type 1 diabetes mellitus with diabetic polyneuropathy: Secondary | ICD-10-CM

## 2012-06-28 DIAGNOSIS — E1049 Type 1 diabetes mellitus with other diabetic neurological complication: Secondary | ICD-10-CM

## 2012-06-28 DIAGNOSIS — E063 Autoimmune thyroiditis: Secondary | ICD-10-CM

## 2012-06-28 DIAGNOSIS — I10 Essential (primary) hypertension: Secondary | ICD-10-CM

## 2012-06-28 DIAGNOSIS — R000 Tachycardia, unspecified: Secondary | ICD-10-CM | POA: Insufficient documentation

## 2012-06-28 DIAGNOSIS — E1065 Type 1 diabetes mellitus with hyperglycemia: Secondary | ICD-10-CM

## 2012-06-28 DIAGNOSIS — IMO0002 Reserved for concepts with insufficient information to code with codable children: Secondary | ICD-10-CM | POA: Diagnosis not present

## 2012-06-28 DIAGNOSIS — E049 Nontoxic goiter, unspecified: Secondary | ICD-10-CM

## 2012-06-28 DIAGNOSIS — G909 Disorder of the autonomic nervous system, unspecified: Secondary | ICD-10-CM

## 2012-06-28 DIAGNOSIS — E1043 Type 1 diabetes mellitus with diabetic autonomic (poly)neuropathy: Secondary | ICD-10-CM | POA: Insufficient documentation

## 2012-06-28 DIAGNOSIS — E11649 Type 2 diabetes mellitus with hypoglycemia without coma: Secondary | ICD-10-CM

## 2012-06-28 DIAGNOSIS — E038 Other specified hypothyroidism: Secondary | ICD-10-CM

## 2012-06-28 DIAGNOSIS — E1169 Type 2 diabetes mellitus with other specified complication: Secondary | ICD-10-CM

## 2012-06-28 LAB — POCT GLYCOSYLATED HEMOGLOBIN (HGB A1C): Hemoglobin A1C: 9.8

## 2012-06-28 NOTE — Progress Notes (Signed)
Subjective:  Patient Name: [name] Date of Birth: December 02, 1985  MRN: [id number]  Balen Woolum  presents to the office today for follow-up of his T1DM, recurrent DKA, hypoglycemia, mental retardation, hypertension, goiter, thyroiditis, peripheral angiopathy, peripheral neuropathy, and visual impairment.   HISTORY OF PRESENT ILLNESS:   [name] is a 27 y.o. Caucasian young man.  [name] was accompanied by his mother.   1. The patient was diagnosed with T1DM at age 33. We have few details about his medical care until 2007. We know that in about 2006 he saw a pediatric endocrinologist at [facility], [facility], [facility]. Unfortunately, the family chose not to return to see the endocrinologist. In about April of 2007, his primary care provider, Dr. Reed Breech of [facility], started the patient on an insulin pump. Unfortunately, neither the patient nor the family were capable of utilizing the pump's technology effectively. On 10/02/2005 the patient was admitted to the [facility] for poorly controlled type 1 diabetes. I consulted on the patient at that time. I recognized that he had some type of syndrome that was causing mental retardation/organic brain syndrome. He did not have the insight or intelligence to perform the daily tasks of diabetes self-care independently or to utilize the insulin pump. It also appeared that there was not a great deal of adult supervision and support within his family. I changed his regimen to Novolog mix 70/30 insulin taken twice daily. He was to take 26 units each morning at breakfast and 14 units each evening at supper. I also gave him a sliding scale for Humalog lispro with different doses of insulin to be used at breakfast, lunch, supper, and bedtime in addition to his 70/30 insulin when his family members were available to check the blood sugars and give the additional insulin. The patient's  family members agreed to supervise his care. 2. Unfortunately, the patient has not often had the adult supervision and support that he needed. In the last 6 years, the patient has been scheduled to return to our clinic for follow up visits approximately every 3 months, but has actually only returned to our clinic for follow up visits 1-3 times per year. His hemoglobin A1c values have varied from 10.6-14.0%. He has had several more admissions to our hospital for evaluation and treatment of poorly controlled T1DM and diabetic ketoacidosis. He has had the following complications of diabetes: diabetic angiopathy, peripheral neuropathy, and occasional but significant hypoglycemia. In November 2007, the patient was diagnosed with hypothyroidism secondary to Hashimoto's disease and was started on Synthroid at a dose of 25 mcg per day. In November 2008, he was diagnosed with hypertension and started on lisinopril, 5 mg per day. 3. At the time of his last clinic visit on 05/23/12, the hemoglobin A1c was 14%. In the interim, he has taken on doing better in terms of checking his BGs and taking his insulin injections. His aunt, who loves a short distance away, is also helping to take  care of him. His maternal grandmother is also helping. He is still taking Novolog 70/30 insulin, 30 units in AM and 22 units in the PM.  4. Pertinent Review of Systems:  Constitutional: The patient feels "a lot better than I was".  Eyes: Vision is better now. His blurring has improved. He is legally blind. He will have his annual eye exam tomorrow morning. Neck: The patient has no complaints of anterior neck swelling, soreness, tenderness,  pressure, discomfort, or difficulty swallowing.  Heart: The patient has no complaints of palpitations, irregular heat beats, chest pain, or chest pressure. Gastrointestinal: Bowel movents seem normal. The patient has no complaints of excessive hunger, acid reflux, upset stomach, stomach aches or pains,  diarrhea, or constipation. Legs: Muscle mass and strength seem normal. There are no complaints of numbness, tingling, burning, or pain. No edema is noted. Feet: There are no obvious foot problems. There are no recent complaints of numbness, tingling, burning, or pain. No edema is noted. Hypoglycemia: He is not having hypoglycemia very often. 5. BG printout: He checks BGs from 1-4 times daily, mostly 3 times per day. He takes his Novolog Mix insulin twice daily most days. BGs vary from 57-548. He often stays up late at night and eats then. When he overeats carbs, he often really overeats.   PAST MEDICAL, FAMILY, AND SOCIAL HISTORY:  Past Medical History  Diagnosis Date  . Diabetes mellitus     Type 1, diagnosed age 68, with frequent DKA admissions, difficult to control due to MR  . Hypothyroidism   . Diabetic nephropathy     Started on Lisinopril 5 mg  . 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 Impaired cognition     mention of mental retardation  . Goiter   . Moderate or severe vision impairment, both eyes, impairment level not further specified   . Mental retardation   . Hypoglycemia associated with diabetes   . Hypertension   . Thyroiditis, autoimmune   . Angiopathy, diabetic   . Diabetic peripheral neuropathy   . Dysmorphic features     Family History  Problem Relation Age of Onset  . Vision loss Father   . Diabetes Maternal Grandmother     AODM  . Diabetes Paternal Grandmother     AODM  . Diabetes Cousin     Second cousin has juvenile-onset DM.  Marland Kitchen Retinitis pigmentosa Mother     Current outpatient prescriptions:insulin aspart protamine-insulin aspart (NOVOLOG MIX 70/30 FLEXPEN) (70-30) 100 UNIT/ML injection, Inject 20-30 Units into the skin 2 (two) times daily with a meal. Injects 30 units in am and 22 units in the pm., Disp: 10 mL, Rfl: 5;  levothyroxine (SYNTHROID, LEVOTHROID) 25 MCG tablet, Take 25 mcg by mouth daily., Disp: , Rfl:  lisinopril  (PRINIVIL,ZESTRIL) 10 MG tablet, TAKE 1 TABLET (10 MG TOTAL) BY MOUTH DAILY., Disp: 30 tablet, Rfl: 0;  ONETOUCH VERIO test strip, Check blood sugar 10 x daily, Disp: 300 each, Rfl: 3  Allergies as of 06/28/2012  . (No Known Allergies)    1. Work and Family:  He was laid off from his job at the [employer]. He is still living at home.   2. Activities: No regular physical activity 3. Smoking, alcohol, or drugs: No tobacco or alcohol or drugs.  4. Primary Care Provider: None at present  REVIEW OF SYSTEMS: There are no other significant problems involving Ash's other body systems.   Objective:  Vital Signs:  BP 124/80  Pulse 89  Wt 130 lb 6.4 oz (59.149 kg)   Ht Readings from Last 3 Encounters:  05/10/12 5\' 5"  (1.651 m)  04/30/11 5\' 5"  (1.651 m)   Wt Readings from Last 3 Encounters:  06/28/12 130 lb 6.4 oz (59.149 kg)  05/23/12 125 lb 8 oz (56.926 kg)  05/11/12 120 lb 5.9 oz (54.6 kg)   There is no height on file to calculate BSA.  Normalized stature-for-age data available only for age 72  to 20 years. Normalized weight-for-age data available only for age 12 to 20 years.  PHYSICAL EXAM:  Constitutional: The patient appears fairly healthy and well nourished. He is very friendly and affable today. His insight is a bit better. His thought processes are very concrete. He really does not understand or comprehend very much about diabetes, about why he needs to take care of himself, and about what will happen to him if he does not take care of himself. His last admission to the [facility] frightened him, however, so he is motivated to take better care of himself. His understanding of nutrition is very primitive. If left unsupervised for very long, he would never checks his blood sugar or take an insulin injection again.  Face: The face appears somewhat dysmorphic.  Eyes: There is no obvious arcus or proptosis. Moisture appears normal.  Mouth: The oropharynx and tongue appear  normal. Oral moisture is normal. His oral hygiene is better. Neck: The neck appears to be visibly normal. No carotid bruits are noted. The thyroid gland is 20-25 grams in size. The left lobe is larger than the right lobe.The consistency of the thyroid gland is relatively firm. The thyroid gland is not tender to palpation. Lungs: The lungs are clear to auscultation. Air movement is good. Heart: Heart rate and rhythm are regular. Heart sounds S1 and S2 are normal. I did not appreciate any pathologic cardiac murmurs. Abdomen: The abdomen appears to be normal in size. Bowel sounds are normal. There is no obvious hepatomegaly, splenomegaly, or other mass effect.  Arms: Muscle size and bulk are normal for age. Hands: There is no obvious tremor. Phalangeal and metacarpophalangeal joints are normal. Palmar muscles are normal. Palmar skin is normal. Palmar moisture is also normal. Hands are cool to the touch. Legs: Muscles appear normal for age. No edema is present. Feet: Feet are normally formed, but are cool to touch. Dorsalis pedal pulses are faint 1+ bilaterally. Neurologic: Strength is normal for age in both the upper and lower extremities. Muscle tone is normal. Sensation to touch is normal in both the legs and feet.    LAB DATA: Hemoglobin A1c today was 9.8% today, compared with 14% at last visit. 05/10/12: Creatinine 0.62, TSH 1.025,            Assessment and Plan:   ASSESSMENT:  1. Diabetes mellitus: The patient's recent hemoglobin A1c is much lower, c/w some big improvements in BG testing and taking his insulin. again very high.  2. Hypertension: Blood pressure is better with lisinopril. He still needs to walk every day. 3. Peripheral neuropathy: This has improved and is not evident today.  4. Angiopathy: Patient is asymptomatic now. If he exercises on a regular basis and controls his BGs, blood flow in his legs and feet will improve. 5. Goiter: Thyroid gland is about the same size as at last  visit.  The waxing and waning of thyroid gland size is c/w evolving Hashimoto's disease.  6. Hypoglycemia: This has occurred occasionally, especially after physical activity.  7. Hypothyroid: His TSH in the hospital in December was fine.  8. Autonomic neuropathy and tachycardia: These problems have improved, indicating that his BGs are better.   PLAN: 1. Diagnostic: No labs today 2. Therapeutic: Keep up the good work. Continue active supervision of blood sugar checks and insulin doses at breakfast and supper. Try to fit in a walk every day of 30-60 minutes. Continue current insulin plan. Call in two weeks to discuss BG results. 3.  Patient education: Please cooperate with your mother, aunt and grandmother.  4. Follow-up: two  months  Level of Service: This visit lasted in excess of 60 minutes. More than 50% of the visit was devoted to counseling.  David Stall, MD 06/28/2012 11:14 AM

## 2012-06-28 NOTE — Patient Instructions (Signed)
Follow up visit in 2 months. Please call Dr. Fransico Michael in two weeks to diacuss BGs.

## 2012-06-29 DIAGNOSIS — E119 Type 2 diabetes mellitus without complications: Secondary | ICD-10-CM | POA: Diagnosis not present

## 2012-06-29 DIAGNOSIS — H3530 Unspecified macular degeneration: Secondary | ICD-10-CM | POA: Diagnosis not present

## 2012-07-26 ENCOUNTER — Other Ambulatory Visit: Payer: Self-pay | Admitting: "Endocrinology

## 2012-08-30 ENCOUNTER — Encounter: Payer: Self-pay | Admitting: "Endocrinology

## 2012-08-30 ENCOUNTER — Ambulatory Visit (INDEPENDENT_AMBULATORY_CARE_PROVIDER_SITE_OTHER): Payer: Medicare Other | Admitting: "Endocrinology

## 2012-08-30 VITALS — BP 116/82 | HR 113 | Wt 127.0 lb

## 2012-08-30 DIAGNOSIS — I499 Cardiac arrhythmia, unspecified: Secondary | ICD-10-CM

## 2012-08-30 DIAGNOSIS — E049 Nontoxic goiter, unspecified: Secondary | ICD-10-CM

## 2012-08-30 DIAGNOSIS — E1169 Type 2 diabetes mellitus with other specified complication: Secondary | ICD-10-CM | POA: Diagnosis not present

## 2012-08-30 DIAGNOSIS — G909 Disorder of the autonomic nervous system, unspecified: Secondary | ICD-10-CM

## 2012-08-30 DIAGNOSIS — E038 Other specified hypothyroidism: Secondary | ICD-10-CM | POA: Diagnosis not present

## 2012-08-30 DIAGNOSIS — E11649 Type 2 diabetes mellitus with hypoglycemia without coma: Secondary | ICD-10-CM

## 2012-08-30 DIAGNOSIS — E1043 Type 1 diabetes mellitus with diabetic autonomic (poly)neuropathy: Secondary | ICD-10-CM

## 2012-08-30 DIAGNOSIS — E063 Autoimmune thyroiditis: Secondary | ICD-10-CM | POA: Diagnosis not present

## 2012-08-30 DIAGNOSIS — E1049 Type 1 diabetes mellitus with other diabetic neurological complication: Secondary | ICD-10-CM

## 2012-08-30 DIAGNOSIS — E1065 Type 1 diabetes mellitus with hyperglycemia: Secondary | ICD-10-CM | POA: Diagnosis not present

## 2012-08-30 DIAGNOSIS — IMO0002 Reserved for concepts with insufficient information to code with codable children: Secondary | ICD-10-CM

## 2012-08-30 DIAGNOSIS — I10 Essential (primary) hypertension: Secondary | ICD-10-CM

## 2012-08-30 LAB — GLUCOSE, POCT (MANUAL RESULT ENTRY): POC Glucose: 232 mg/dl — AB (ref 70–99)

## 2012-08-30 NOTE — Patient Instructions (Signed)
Follow up visit in 3 months. If [name] does not eat much at breakfast or supper, subtract 3 units from the 70/30 insulin dose to 26 units.

## 2012-08-30 NOTE — Progress Notes (Signed)
Subjective:  Patient Name: [name] Date of Birth: 22-Oct-1985  MRN: [id number]  [name]  presents to the office today for follow-up of his T1DM, recurrent DKA, hypoglycemia, mental retardation, hypertension, goiter, thyroiditis, autonomic neuropathy and tachycardia, peripheral angiopathy, peripheral neuropathy, and visual impairment.   HISTORY OF PRESENT ILLNESS:   [name] is a 27 y.o. Caucasian young man.  [name] was accompanied by his mother and sister.   1. The patient was diagnosed with T1DM at age 39. We have few details about his medical care until 2007. We know that in about 2006 he saw a pediatric endocrinologist at [facility], [facility], [facility]. Unfortunately, the family chose not to return to see the endocrinologist. In about April of 2007, his primary care provider, Dr. Reed Breech of [facility], started the patient on an insulin pump. Unfortunately, neither the patient nor the family were capable of utilizing the pump's technology effectively. On 10/02/2005 the patient was admitted to the [facility] for poorly controlled type 1 diabetes. I consulted on the patient at that time. I recognized that he had some type of syndrome that was causing mental retardation/organic brain syndrome. He did not have the insight or intelligence to perform the daily tasks of diabetes self-care independently or to utilize the insulin pump. It also appeared that there was not a great deal of adult supervision and support within his family. I changed his regimen to Novolog mix 70/30 insulin taken twice daily. He was to take 26 units each morning at breakfast and 14 units each evening at supper. I also gave him a sliding scale for Humalog lispro with different doses of insulin to be used at breakfast, lunch, supper, and bedtime in addition to his 70/30 insulin when his family members were available to check the blood sugars  and give the additional insulin. The patient's family members agreed to supervise his care. 2. Unfortunately, the patient has not often had the adult supervision and support that he needed. In the last 6 years, the patient has been scheduled to return to our clinic for follow up visits approximately every 3 months, but has actually only returned to our clinic for follow up visits 1-3 times per year. His hemoglobin A1c values have varied from 10.6-14.0%. He has had several more admissions to our hospital for evaluation and treatment of poorly controlled T1DM and diabetic ketoacidosis. He has had the following complications of diabetes: diabetic angiopathy, peripheral neuropathy, autonomic neuropathy, tachycardia, and occasional but significant hypoglycemia. In November 2007, the patient was diagnosed with hypothyroidism secondary to Hashimoto's disease and was started on Synthroid at a dose of 25 mcg per day. In November 2008, he was diagnosed with hypertension and started on lisinopril, 5 mg per day. 3. At the time of his last clinic visit on 06/28/12, the hemoglobin A1c was 9.8%, compared with 14% at the prior visit. In the interim, he has been healthy. He sometimes checks BGs more frequently, sometimes less. He has been having more symptomatic low BGs,  from 3 AM to 7 AM especially if he does not eat enough at supper. Marland Kitchen He often treats the low BGs without checking his BG, so we don't have much documentation. His aunt, who lives a short distance away,and his maternal grandmother are both still helping to take care of him. He is still taking Novolog 70/30 insulin, 30 units in AM and 22 units in the PM, but misses some doses at times. In the last  month he has been drinking a lot more regular sodas and consuming more food carbohydrates, causing much higher BGs. 4. Pertinent Review of Systems:  Constitutional: The patient feels "pretty good". He has more energy.  Eyes: Vision is better now with his glasses. He is  legally blind. He had his annual eye exam the day after his last visit. There were no signs of diabetic retinopathy.  Neck: The patient has no complaints of anterior neck swelling, soreness, tenderness,  pressure, discomfort, or difficulty swallowing.  Heart: The patient has no complaints of palpitations, irregular heat beats, chest pain, or chest pressure. Gastrointestinal: Bowel movents seem normal. The patient has no complaints of excessive hunger, acid reflux, upset stomach, stomach aches or pains, diarrhea, or constipation. Legs: Muscle mass and strength seem normal. There are no complaints of numbness, tingling, burning, or pain. No edema is noted. Feet: There are no obvious foot problems. There are no recent complaints of numbness, tingling, burning, or pain. No edema is noted. Hypoglycemia: He has hypoglycemia 1-2 times per week now, but was having hypoglycemia much more frequently 2-3 months ago.  5. BG printout: He checks BGs from 1-4 times daily, mostly 3 times per day. He takes his Novolog Mix insulin twice daily on most days. BGs vary from 65-585. He has been having more BGs in the 300s-500s in the past 4 weeks. He often stays up late at night and eats then. When he doesn't want to eat, he doesn't. At other times he really overeats carbs. When he feels that the family is trying to control him too much, he rebels and either drinks too many regular sodas or overeats or won't check his BGs or won't take his insulin.   PAST MEDICAL, FAMILY, AND SOCIAL HISTORY:  Past Medical History  Diagnosis Date  . Diabetes mellitus     Type 1, diagnosed age 61, with frequent DKA admissions, difficult to control due to MR  . Hypothyroidism   . Diabetic nephropathy     Started on Lisinopril 5 mg  . 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 Impaired cognition     mention of mental retardation  . Goiter   . Moderate or severe vision impairment, both eyes, impairment level not further  specified   . Mental retardation   . Hypoglycemia associated with diabetes   . Hypertension   . Thyroiditis, autoimmune   . Angiopathy, diabetic   . Diabetic peripheral neuropathy   . Dysmorphic features     Family History  Problem Relation Age of Onset  . Vision loss Father   . Diabetes Maternal Grandmother     AODM  . Diabetes Paternal Grandmother     AODM  . Diabetes Cousin     Second cousin has juvenile-onset DM.  Marland Kitchen Retinitis pigmentosa Mother     Current outpatient prescriptions:insulin aspart protamine-insulin aspart (NOVOLOG MIX 70/30 FLEXPEN) (70-30) 100 UNIT/ML injection, Inject 20-30 Units into the skin 2 (two) times daily with a meal. Injects 30 units in am and 22 units in the pm., Disp: 10 mL, Rfl: 5;  levothyroxine (SYNTHROID, LEVOTHROID) 25 MCG tablet, Take 25 mcg by mouth daily., Disp: , Rfl:  lisinopril (PRINIVIL,ZESTRIL) 10 MG tablet, TAKE 1 TABLET (10 MG TOTAL) BY MOUTH DAILY., Disp: 30 tablet, Rfl: 0;  ONETOUCH VERIO test strip, Check blood sugar 10 x daily, Disp: 300 each, Rfl: 3  Allergies as of 08/30/2012  . (No Known Allergies)    1.  Work and Family:  He was laid off from his job at the [employer]. He is still living at home.   2. Activities: No regular physical activity 3. Smoking, alcohol, or drugs: No tobacco or alcohol or drugs.  4. Primary Care Provider: None at present  REVIEW OF SYSTEMS: There are no other significant problems involving Evans's other body systems.   Objective:  Vital Signs:  BP 116/82  Pulse 113  Wt 127 lb (57.607 kg)  BMI 21.13 kg/m2   Ht Readings from Last 3 Encounters:  05/10/12 5\' 5"  (1.651 m)  04/30/11 5\' 5"  (1.651 m)   Wt Readings from Last 3 Encounters:  08/30/12 127 lb (57.607 kg)  06/28/12 130 lb 6.4 oz (59.149 kg)  05/23/12 125 lb 8 oz (56.926 kg)   There is no height on file to calculate BSA.  Normalized stature-for-age data available only for age 61 to 20 years. Normalized weight-for-age  data available only for age 61 to 20 years.  PHYSICAL EXAM:  Constitutional: The patient appears fairly healthy and well nourished. He has lost 3 pounds since his lat visit. He is very friendly and affable today. His insight is somewhat worse. His thought processes are very concrete. He really does not understand or comprehend very much about diabetes, about why he needs to take care of himself, and about what will happen to him if he does not take care of himself. At last visit his December admission to the MICU frightened him, so he was better motivated to take better care of himself. By now, however, that motivation has decreased significantly. His understanding about his diabetes and nutrition is very primitive. If left unsupervised for very long, he would never check his blood sugar or take an insulin injection again.  Face: The face appears somewhat dysmorphic.  Eyes: There is no obvious arcus or proptosis. Moisture appears normal.  Mouth: The oropharynx and tongue appear normal. Oral moisture is normal. His oral hygiene is worse today. Neck: The neck appears to be visibly normal. No carotid bruits are noted. The thyroid gland is 20+ grams in size. The left lobe is larger than the right lobe.The consistency of the thyroid gland is relatively firm. The thyroid gland is not tender to palpation. Lungs: The lungs are clear to auscultation. Air movement is good. Heart: Heart rate and rhythm are regular. Heart sounds S1 and S2 are normal. I did not appreciate any pathologic cardiac murmurs. Abdomen: The abdomen appears to be normal in size. Bowel sounds are normal. There is no obvious hepatomegaly, splenomegaly, or other mass effect.  Arms: Muscle size and bulk are normal for age. Hands: There is no obvious tremor. Phalangeal and metacarpophalangeal joints are normal. Palmar muscles are normal. Palmar skin is normal. Palmar moisture is also normal. Hands are cool to the touch. Legs: Muscles appear  normal for age. No edema is present. Feet: Feet are normally formed, but are cool to touch. Dorsalis pedal pulses are faint 1+ bilaterally. Neurologic: Strength is normal for age in both the upper and lower extremities. Muscle tone is normal. Sensation to touch is normal in both the legs and feet.    LAB DATA: Hemoglobin A1c today was 8.6%, compared with 9.8% at last visit and with 14% at the prior visit. 05/10/12: Creatinine 0.62, TSH 1.025,            Assessment and Plan:   ASSESSMENT:  1. Diabetes mellitus: The patient's recent hemoglobin A1c is much lower, but his BG  printout is much worse. He was having a lot of low BGs during the early morning and morning hours 2-3 months ago, which caused the lower HbA1c. In the past month, however, his excess glucose intake has caused his BGs to be much higher.    2. Hypertension: Blood pressure is about the same on his current dose of lisinopril. He still needs to take his lisinopril every day and walk every day. 3. Peripheral neuropathy: This problem has improved and is not evident today.  4. Angiopathy: Patient is asymptomatic now. If he exercises on a regular basis and controls his BGs, blood flow in his legs and feet will improve. 5. Goiter: Thyroid gland is smaller today. The waxing and waning of thyroid gland size is c/w evolving Hashimoto's disease.  6. Hypoglycemia: This had occurred more frequently 2-3 months ago, but less frequently now.  Most of his episodes of hypoglycemia occur when he takes his insulin, but then doesn't eat. Sometimes his family members can cajole him into eating, but sometimes they can't. 7. Hypothyroid: His TSH in the hospital in December was fine. When he takes his Synthroid he also takes his lisinopril. At other times, however, he does not take either pill.  8. Autonomic neuropathy and tachycardia: These problems have worsened again, indicating that his BGs are worse again.   PLAN: 1. Diagnostic: No labs today. Annual  surveillance labs prior to next visit.  2. Therapeutic: Stop the sugary drinks. Continue active supervision of blood sugar checks and insulin doses at breakfast and supper. If he does not eat much at breakfast or supper, reduce the insulin dose by 3 units. Try to fit in a walk every day of 30-60 minutes. 3. Patient education: Please cooperate with your mother, sister, aunt, and grandmother.  4. Follow-up: three  months  Level of Service: This visit lasted in excess of 50 minutes. More than 50% of the visit was devoted to counseling.  David Stall, MD 08/30/2012 2:29 PM

## 2012-08-31 ENCOUNTER — Other Ambulatory Visit: Payer: Self-pay | Admitting: "Endocrinology

## 2012-09-01 ENCOUNTER — Telehealth: Payer: Self-pay | Admitting: "Endocrinology

## 2012-09-01 NOTE — Telephone Encounter (Signed)
Mother called. She said he had no more refills of Synthroid. She asked me to call in a prescription for brand Synthroid, 25 mcg tablets, #30, one pill per day, 6 refills, to [facility] [phone]. I did so. David Stall

## 2012-10-21 ENCOUNTER — Other Ambulatory Visit: Payer: Self-pay | Admitting: "Endocrinology

## 2012-11-10 ENCOUNTER — Other Ambulatory Visit: Payer: Self-pay | Admitting: *Deleted

## 2012-11-10 DIAGNOSIS — E1065 Type 1 diabetes mellitus with hyperglycemia: Secondary | ICD-10-CM | POA: Diagnosis not present

## 2012-11-10 DIAGNOSIS — IMO0002 Reserved for concepts with insufficient information to code with codable children: Secondary | ICD-10-CM | POA: Diagnosis not present
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
## 2012-11-28 ENCOUNTER — Other Ambulatory Visit: Payer: Self-pay | Admitting: "Endocrinology

## 2012-12-08 DIAGNOSIS — IMO0002 Reserved for concepts with insufficient information to code with codable children: Secondary | ICD-10-CM | POA: Diagnosis not present

## 2012-12-08 DIAGNOSIS — E1065 Type 1 diabetes mellitus with hyperglycemia: Secondary | ICD-10-CM | POA: Diagnosis not present

## 2012-12-09 LAB — COMPREHENSIVE METABOLIC PANEL
ALT: 8 U/L (ref 0–53)
AST: 10 U/L (ref 0–37)
Albumin: 4.6 g/dL (ref 3.5–5.2)
Alkaline Phosphatase: 88 U/L (ref 39–117)
BUN: 13 mg/dL (ref 6–23)
Calcium: 10.3 mg/dL (ref 8.4–10.5)
Chloride: 96 mEq/L (ref 96–112)
Potassium: 5 mEq/L (ref 3.5–5.3)
Sodium: 136 mEq/L (ref 135–145)
Total Protein: 8.1 g/dL (ref 6.0–8.3)

## 2012-12-09 LAB — LIPID PANEL
Cholesterol: 202 mg/dL — ABNORMAL HIGH (ref 0–200)
LDL Cholesterol: 127 mg/dL — ABNORMAL HIGH (ref 0–99)
Triglycerides: 85 mg/dL (ref <150)

## 2012-12-09 LAB — MICROALBUMIN / CREATININE URINE RATIO
Creatinine, Urine: 130.6 mg/dL
Microalb Creat Ratio: 51.9 mg/g — ABNORMAL HIGH (ref 0.0–30.0)

## 2012-12-09 LAB — T3, FREE: T3, Free: 2.8 pg/mL (ref 2.3–4.2)

## 2012-12-18 ENCOUNTER — Ambulatory Visit (INDEPENDENT_AMBULATORY_CARE_PROVIDER_SITE_OTHER): Payer: Medicare Other | Admitting: "Endocrinology

## 2012-12-18 ENCOUNTER — Encounter: Payer: Self-pay | Admitting: "Endocrinology

## 2012-12-18 VITALS — BP 125/81 | HR 85 | Wt 123.0 lb

## 2012-12-18 DIAGNOSIS — E1169 Type 2 diabetes mellitus with other specified complication: Secondary | ICD-10-CM

## 2012-12-18 DIAGNOSIS — E1049 Type 1 diabetes mellitus with other diabetic neurological complication: Secondary | ICD-10-CM | POA: Diagnosis not present

## 2012-12-18 DIAGNOSIS — E1043 Type 1 diabetes mellitus with diabetic autonomic (poly)neuropathy: Secondary | ICD-10-CM

## 2012-12-18 DIAGNOSIS — E063 Autoimmune thyroiditis: Secondary | ICD-10-CM

## 2012-12-18 DIAGNOSIS — E1042 Type 1 diabetes mellitus with diabetic polyneuropathy: Secondary | ICD-10-CM

## 2012-12-18 DIAGNOSIS — E1059 Type 1 diabetes mellitus with other circulatory complications: Secondary | ICD-10-CM

## 2012-12-18 DIAGNOSIS — E1065 Type 1 diabetes mellitus with hyperglycemia: Secondary | ICD-10-CM | POA: Diagnosis not present

## 2012-12-18 DIAGNOSIS — E1051 Type 1 diabetes mellitus with diabetic peripheral angiopathy without gangrene: Secondary | ICD-10-CM

## 2012-12-18 DIAGNOSIS — IMO0002 Reserved for concepts with insufficient information to code with codable children: Secondary | ICD-10-CM | POA: Diagnosis not present

## 2012-12-18 DIAGNOSIS — E049 Nontoxic goiter, unspecified: Secondary | ICD-10-CM

## 2012-12-18 DIAGNOSIS — E7800 Pure hypercholesterolemia, unspecified: Secondary | ICD-10-CM

## 2012-12-18 DIAGNOSIS — E038 Other specified hypothyroidism: Secondary | ICD-10-CM

## 2012-12-18 DIAGNOSIS — G909 Disorder of the autonomic nervous system, unspecified: Secondary | ICD-10-CM

## 2012-12-18 DIAGNOSIS — I798 Other disorders of arteries, arterioles and capillaries in diseases classified elsewhere: Secondary | ICD-10-CM

## 2012-12-18 DIAGNOSIS — E11649 Type 2 diabetes mellitus with hypoglycemia without coma: Secondary | ICD-10-CM

## 2012-12-18 DIAGNOSIS — R000 Tachycardia, unspecified: Secondary | ICD-10-CM

## 2012-12-18 LAB — GLUCOSE, POCT (MANUAL RESULT ENTRY): POC Glucose: 311 mg/dl — AB (ref 70–99)

## 2012-12-18 MED ORDER — ATORVASTATIN CALCIUM 10 MG PO TABS: 10.0000 mg | ORAL_TABLET | Freq: Every day | ORAL | Status: DC

## 2012-12-18 NOTE — Patient Instructions (Signed)
Follow up visit in 3 months. Please increase 70/30 insulin to 33 units each morning and 23 units at supper. Please add 10 mg of atorvastatin at supper.

## 2012-12-18 NOTE — Progress Notes (Signed)
Subjective:  Patient Name: [name] Date of Birth: 01-09-1986  MRN: [id number]  [name]  presents to the office today for follow-up of his T1DM, recurrent DKA, hypoglycemia, mental retardation, hypertension, goiter, thyroiditis, autonomic neuropathy and tachycardia, peripheral angiopathy, peripheral neuropathy, and visual impairment.   HISTORY OF PRESENT ILLNESS:   [name] is a 27 y.o. Caucasian young man.  [name] was accompanied by his maternal grandmother, Oneita Hurt.   1. The patient was diagnosed with T1DM at age 48. We have few details about his medical care until 2007. We know that in about 2006 he saw a pediatric endocrinologist at [facility], [facility], [facility]. Unfortunately, the family chose not to return to see the endocrinologist. In about April of 2007, his primary care provider, Dr. Reed Breech of [facility], started the patient on an insulin pump. Unfortunately, neither the patient nor the family were capable of utilizing the pump's technology effectively. On 10/02/2005 the patient was admitted to the [facility] for poorly controlled type 1 diabetes. I consulted on the patient at that time. I recognized that he had some type of syndrome that was causing mental retardation/organic brain syndrome. He did not have the insight or intelligence to perform the daily tasks of diabetes self-care independently or to utilize the insulin pump. It also appeared that there was not a great deal of adult supervision and support within his family. I changed his regimen to Novolog mix 70/30 insulin taken twice daily. He was to take 26 units each morning at breakfast and 14 units each evening at supper. I also gave him a sliding scale for Humalog lispro with different doses of insulin to be used at breakfast, lunch, supper, and bedtime in addition to his 70/30 insulin when his family members were available to  check the blood sugars and give the additional insulin. The patient's family members agreed to supervise his care. 2. Unfortunately, the patient has not often had the adult supervision and support that he needed. In the last 6 years, the patient has been scheduled to return to our clinic for follow up visits approximately every 3 months, but has actually only returned to our clinic for follow up visits 1-3 times per year. His hemoglobin A1c values have varied from 10.6-14.0%. He has had several more admissions to our hospital for evaluation and treatment of poorly controlled T1DM and diabetic ketoacidosis. He has had the following complications of diabetes: diabetic angiopathy, peripheral neuropathy, autonomic neuropathy, tachycardia, and occasional but significant hypoglycemia. In November 2007, the patient was diagnosed with hypothyroidism secondary to Hashimoto's disease and was started on Synthroid at a dose of 25 mcg per day. In November 2008, he was diagnosed with hypertension and started on lisinopril, 5 mg per day.  3. At the time of his last clinic visit on 08/30/12, the hemoglobin A1c was 8.6%, compared with 98% in February and with 14% in December.   A. In the interim, he has been healthy. He usually checks BGs twice a day, sometimes more often, but also sometimes less often.   B.  His aunt, who lives a short distance away, and his maternal grandmother are both still helping to take care of him. He is still taking Novolog 70/30 insulin, 30 units in AM and 22 units in the PM, but misses some doses at times. In the last month he has been drinking fewer regular sodas and consuming fewer desserts.   4. Pertinent Review of Systems:  Constitutional:  The patient feels "good". He has more energy.  Eyes: Vision is better now with his glasses. He is legally blind. He had his annual eye exam last February. There were no signs of diabetic retinopathy.  Neck: The patient has no complaints of anterior neck  swelling, soreness, tenderness,  pressure, discomfort, or difficulty swallowing.  Heart: The patient has no complaints of palpitations, irregular heat beats, chest pain, or chest pressure. Gastrointestinal: Bowel movents seem normal for the most part, but he does have constipation "every now and then". He had an upset stomach one night at camp. The patient has no other complaints of excessive hunger, acid reflux, upset stomach, stomach aches or pains, diarrhea, or constipation. Legs: Muscle mass and strength seem normal. There are no complaints of numbness, tingling, burning, or pain. No edema is noted. Feet: There are no obvious foot problems. There are no recent complaints of numbness, tingling, burning, or pain. No edema is noted. Hypoglycemia: He has hypoglycemia once a week or less.    5. BG meter review: Due to a software problem last week we can't download his BG meter today. Printout:A review of his BG meter shows that he checks BGs from 0-4 times daily, mostly 2 times per day. He takes his Novolog Mix insulin twice daily on most days. BGs vary from 109-496, compared with 65-585 at last visit. He often stays up late at night and eats then. When he doesn't want to eat, he doesn't. At other times he really overeats carbs. He is not having as many problems with being rebellious.  PAST MEDICAL, FAMILY, AND SOCIAL HISTORY:  Past Medical History  Diagnosis Date  . Diabetes mellitus     Type 1, diagnosed age 27, with frequent DKA admissions, difficult to control due to MR  . Hypothyroidism   . Diabetic nephropathy     Started on Lisinopril 5 mg  . 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 Impaired cognition     mention of mental retardation  . Goiter   . Moderate or severe vision impairment, both eyes, impairment level not further specified   . Mental retardation   . Hypoglycemia associated with diabetes   . Hypertension   . Thyroiditis, autoimmune   . Angiopathy,  diabetic   . Diabetic peripheral neuropathy   . Dysmorphic features     Family History  Problem Relation Age of Onset  . Vision loss Father   . Diabetes Maternal Grandmother     AODM  . Diabetes Paternal Grandmother     AODM  . Diabetes Cousin     Second cousin has juvenile-onset DM.  Marland Kitchen Retinitis pigmentosa Mother     Current outpatient prescriptions:insulin aspart protamine-insulin aspart (NOVOLOG MIX 70/30 FLEXPEN) (70-30) 100 UNIT/ML injection, Inject 20-30 Units into the skin 2 (two) times daily with a meal. Injects 30 units in am and 22 units in the pm., Disp: 10 mL, Rfl: 5;  Lancets (ACCU-CHEK MULTICLIX) lancets, USE AS DIRECTED TO TEST BLOOD GLUCOSE LEVELS, Disp: 204 each, Rfl: 0 levothyroxine (SYNTHROID, LEVOTHROID) 25 MCG tablet, Take 25 mcg by mouth daily., Disp: , Rfl: ;  lisinopril (PRINIVIL,ZESTRIL) 10 MG tablet, TAKE 1 TABLET (10 MG TOTAL) BY MOUTH DAILY., Disp: 30 tablet, Rfl: 0;  ONETOUCH VERIO test strip, Check blood sugar 10 x daily, Disp: 300 each, Rfl: 3  Allergies as of 12/18/2012  . (No Known Allergies)    1. Work and Family:  Since being laid off  from his job at the [employer], he has been unemployed. He is still living at home.   2. Activities: He walks occasionally with his grandmother. 3. Smoking, alcohol, or drugs: No tobacco or alcohol or drugs.  4. Primary Care Provider: None at present  REVIEW OF SYSTEMS: There are no other significant problems involving [name]'s other body systems.   Objective:  Vital Signs:  BP 125/81  Pulse 85  Wt 123 lb (55.792 kg)  BMI 20.47 kg/m2   Ht Readings from Last 3 Encounters:  05/10/12 5\' 5"  (1.651 m)  04/30/11 5\' 5"  (1.651 m)   Wt Readings from Last 3 Encounters:  12/18/12 123 lb (55.792 kg)  08/30/12 127 lb (57.607 kg)  06/28/12 130 lb 6.4 oz (59.149 kg)   There is no height on file to calculate BSA.  Normalized stature-for-age data available only for age 48 to 20 years. Normalized  weight-for-age data available only for age 48 to 20 years.  PHYSICAL EXAM:  Constitutional: The patient appears fairly healthy and well nourished, but more slender. He has lost 4 pounds since his last visit. He is very friendly and affable today. His insight is somewhat worse. His thought processes are very concrete. He really does not understand or comprehend very much about diabetes, about why he needs to take care of himself, and about what will happen to him if he does not take care of himself. At his visit in February, his December admission to the [facility] was still frightening him, so he was better motivated to take better care of himself. By now, however, that motivation has decreased significantly. His understanding about his diabetes and nutrition is very primitive. If left unsupervised for very long, he would never check his blood sugar or take an insulin injection again.  Face: The face appears somewhat dysmorphic.  Eyes: There is no obvious arcus or proptosis. Moisture appears normal.  Mouth: The oropharynx and tongue appear normal. Oral moisture is normal. His oral hygiene is worse today. Neck: The neck appears to be visibly normal. No carotid bruits are noted. The thyroid gland is more enlarged at 20-25 grams in size. The consistency of the thyroid gland is relatively firm. The thyroid gland is not tender to palpation. Lungs: The lungs are clear to auscultation. Air movement is good. Heart: Heart rate and rhythm are regular. Heart sounds S1 and S2 are normal. I did not appreciate any pathologic cardiac murmurs. Abdomen: The abdomen appears to be normal in size. Bowel sounds are normal. There is no obvious hepatomegaly, splenomegaly, or other mass effect.  Arms: Muscle size and bulk are normal for age. Hands: There is no obvious tremor. Phalangeal and metacarpophalangeal joints are normal. Palmar muscles are normal. Palmar skin is normal. Palmar moisture is also normal. Hands are cool to the  touch. Legs: Muscles appear normal for age. No edema is present. Feet: Feet are normally formed, but are cool to touch. Dorsalis pedal pulses are faint 1+ bilaterally. Neurologic: Strength is normal for age in both the upper and lower extremities. Muscle tone is normal. Sensation to touch is normal in both the legs and feet.    LAB DATA: Hemoglobin A1c today was 10.6%, compared with 8.6% at the last visit and with 9.8% at the visit prior, and with 14% on 05/23/12. 7/018/14: CMP normal, except glucose of 14; cholesterol 202, triglycerides 85, HDL 58, LDL 127; urinary microalbumin/creatinine ratio 51.9; TSH 2.993, free T4 1.32, free T3 2.8  05/10/12: Creatinine 0.62, TSH 1.025,  Assessment and Plan:   ASSESSMENT:  1. Diabetes mellitus: The patient's recent hemoglobin A1c is much higher, as is his BG meter review. The increase in HbA1c is partly due to not having as many low BGs recently.  2. Hypertension: Blood pressure is a little higher today. I suspect he misses more doses of lisinopril than he recognizes.  3. Peripheral neuropathy: This problem has improved and is not evident today, but will certainly recur if he does not get his BGs under better control.   4. Angiopathy: Patient is asymptomatic now. If he exercises on a regular basis and controls his BGs, blood flow in his legs and feet will improve. 5. Goiter: Thyroid gland is smaller today. The waxing and waning of thyroid gland size is c/w evolving Hashimoto's disease.  6. Hypoglycemia: This had occurred more frequently 2-3 months ago, but less frequently now.   7. Hypothyroid: His TSH in the hospital in December was fine. This month his TFTs are borderline low. When he takes his Synthroid he also takes his lisinopril. At other times, however, he does not take either pill.  8. Autonomic neuropathy and tachycardia: These problems have worsened again, indicating that his BGs are worse again.  9. Hypercholesterolemia: Cholesterol  and LDL are much worse.   PLAN: 1. Diagnostic: No labs today, except HbA1c.  Repeat CMP and urinary microalbumin/creatinine ratio prior to next visit.  2. Therapeutic: Stop the sugary drinks. Continue active supervision of blood sugar checks and insulin doses at breakfast and supper. Increase 70/30 insulin to 32 units each morning and 23 units at supper. If he does not eat much at breakfast or supper, reduce the insulin dose by 3 units. Try to fit in a walk every day of 30-60 minutes. Start 10 mg Atorvastatin  each PM. 3. Patient education: Please cooperate with your mother, sister, aunt, and grandmother.  4. Follow-up: three  months  Level of Service: This visit lasted in excess of 50 minutes. More than 50% of the visit was devoted to counseling.  David Stall, MD 12/18/2012 2:32 PM

## 2012-12-19 ENCOUNTER — Other Ambulatory Visit (HOSPITAL_COMMUNITY): Payer: Self-pay | Admitting: Internal Medicine

## 2012-12-19 ENCOUNTER — Other Ambulatory Visit: Payer: Self-pay | Admitting: "Endocrinology

## 2012-12-19 NOTE — Telephone Encounter (Signed)
Not [facility] pt - please return to the pharmacy   Thanks

## 2013-01-12 ENCOUNTER — Other Ambulatory Visit (HOSPITAL_COMMUNITY): Payer: Self-pay | Admitting: Internal Medicine

## 2013-01-12 NOTE — Telephone Encounter (Signed)
Not [facility] pt ; please send back to the pharmacy  Thanks 

## 2013-01-15 ENCOUNTER — Telehealth: Payer: Self-pay | Admitting: "Endocrinology

## 2013-01-15 ENCOUNTER — Other Ambulatory Visit (HOSPITAL_COMMUNITY): Payer: Self-pay | Admitting: "Endocrinology

## 2013-01-15 DIAGNOSIS — IMO0002 Reserved for concepts with insufficient information to code with codable children: Secondary | ICD-10-CM

## 2013-01-15 DIAGNOSIS — E1065 Type 1 diabetes mellitus with hyperglycemia: Secondary | ICD-10-CM

## 2013-01-15 MED ORDER — INSULIN ASPART PROT & ASPART (70-30 MIX) 100 UNIT/ML ~~LOC~~ SUSP: 20.0000 [IU] | Freq: Two times a day (BID) | SUBCUTANEOUS | Status: DC

## 2013-01-15 NOTE — Telephone Encounter (Signed)
RX REFILL insulin aspart protamine-insulin aspart (NOVOLOG MIX 70/30 FLEXPEN) (70-30) 100 UNIT/ML injection// [facility]

## 2013-01-31 ENCOUNTER — Other Ambulatory Visit: Payer: Self-pay | Admitting: *Deleted

## 2013-01-31 DIAGNOSIS — E7800 Pure hypercholesterolemia, unspecified: Secondary | ICD-10-CM

## 2013-01-31 MED ORDER — ATORVASTATIN CALCIUM 10 MG PO TABS: 10.0000 mg | ORAL_TABLET | Freq: Every day | ORAL | Status: DC

## 2013-03-07 DIAGNOSIS — Z23 Encounter for immunization: Secondary | ICD-10-CM | POA: Diagnosis not present

## 2013-03-09 ENCOUNTER — Other Ambulatory Visit: Payer: Self-pay | Admitting: *Deleted

## 2013-03-09 DIAGNOSIS — E1065 Type 1 diabetes mellitus with hyperglycemia: Secondary | ICD-10-CM

## 2013-03-09 DIAGNOSIS — IMO0002 Reserved for concepts with insufficient information to code with codable children: Secondary | ICD-10-CM

## 2013-03-15 ENCOUNTER — Other Ambulatory Visit: Payer: Self-pay | Admitting: *Deleted

## 2013-03-15 DIAGNOSIS — E7800 Pure hypercholesterolemia, unspecified: Secondary | ICD-10-CM

## 2013-03-15 MED ORDER — ATORVASTATIN CALCIUM 10 MG PO TABS: 10.0000 mg | ORAL_TABLET | Freq: Every day | ORAL | Status: DC

## 2013-03-29 ENCOUNTER — Other Ambulatory Visit: Payer: Self-pay

## 2013-03-29 ENCOUNTER — Ambulatory Visit (INDEPENDENT_AMBULATORY_CARE_PROVIDER_SITE_OTHER): Payer: Medicare Other | Admitting: "Endocrinology

## 2013-03-29 ENCOUNTER — Other Ambulatory Visit: Payer: Self-pay | Admitting: *Deleted

## 2013-03-29 ENCOUNTER — Encounter: Payer: Self-pay | Admitting: "Endocrinology

## 2013-03-29 VITALS — BP 134/93 | HR 100 | Wt 120.4 lb

## 2013-03-29 DIAGNOSIS — E1065 Type 1 diabetes mellitus with hyperglycemia: Secondary | ICD-10-CM

## 2013-03-29 DIAGNOSIS — R000 Tachycardia, unspecified: Secondary | ICD-10-CM

## 2013-03-29 DIAGNOSIS — E1059 Type 1 diabetes mellitus with other circulatory complications: Secondary | ICD-10-CM

## 2013-03-29 DIAGNOSIS — E1051 Type 1 diabetes mellitus with diabetic peripheral angiopathy without gangrene: Secondary | ICD-10-CM

## 2013-03-29 DIAGNOSIS — I498 Other specified cardiac arrhythmias: Secondary | ICD-10-CM | POA: Diagnosis not present

## 2013-03-29 DIAGNOSIS — T7401XA Adult neglect or abandonment, confirmed, initial encounter: Secondary | ICD-10-CM | POA: Insufficient documentation

## 2013-03-29 DIAGNOSIS — IMO0002 Reserved for concepts with insufficient information to code with codable children: Secondary | ICD-10-CM

## 2013-03-29 DIAGNOSIS — Z5189 Encounter for other specified aftercare: Secondary | ICD-10-CM

## 2013-03-29 DIAGNOSIS — E860 Dehydration: Secondary | ICD-10-CM | POA: Insufficient documentation

## 2013-03-29 DIAGNOSIS — T7401XD Adult neglect or abandonment, confirmed, subsequent encounter: Secondary | ICD-10-CM

## 2013-03-29 DIAGNOSIS — I10 Essential (primary) hypertension: Secondary | ICD-10-CM | POA: Diagnosis not present

## 2013-03-29 DIAGNOSIS — G909 Disorder of the autonomic nervous system, unspecified: Secondary | ICD-10-CM

## 2013-03-29 DIAGNOSIS — E039 Hypothyroidism, unspecified: Secondary | ICD-10-CM

## 2013-03-29 DIAGNOSIS — R634 Abnormal weight loss: Secondary | ICD-10-CM

## 2013-03-29 DIAGNOSIS — E1049 Type 1 diabetes mellitus with other diabetic neurological complication: Secondary | ICD-10-CM

## 2013-03-29 DIAGNOSIS — E049 Nontoxic goiter, unspecified: Secondary | ICD-10-CM

## 2013-03-29 DIAGNOSIS — E1043 Type 1 diabetes mellitus with diabetic autonomic (poly)neuropathy: Secondary | ICD-10-CM

## 2013-03-29 DIAGNOSIS — I798 Other disorders of arteries, arterioles and capillaries in diseases classified elsewhere: Secondary | ICD-10-CM

## 2013-03-29 MED ORDER — INSULIN ASPART PROT & ASPART (70-30 MIX) 100 UNIT/ML PEN: PEN_INJECTOR | SUBCUTANEOUS | Status: DC

## 2013-03-29 NOTE — Patient Instructions (Signed)
Follow up visit in 3 months. Please call Dr. Fransico Briana Newman in two weeks, on a Sunday or Wednesday evening between 8:00-9:30 PM to discuss BGs.
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
## 2013-05-09 ENCOUNTER — Other Ambulatory Visit: Payer: Self-pay | Admitting: *Deleted

## 2013-05-22 ENCOUNTER — Other Ambulatory Visit: Payer: Self-pay | Admitting: *Deleted

## 2013-05-22 DIAGNOSIS — E1065 Type 1 diabetes mellitus with hyperglycemia: Secondary | ICD-10-CM

## 2013-05-22 DIAGNOSIS — IMO0002 Reserved for concepts with insufficient information to code with codable children: Secondary | ICD-10-CM

## 2013-05-26 ENCOUNTER — Other Ambulatory Visit: Payer: Self-pay | Admitting: "Endocrinology

## 2013-06-16 ENCOUNTER — Other Ambulatory Visit: Payer: Self-pay | Admitting: "Endocrinology

## 2013-06-25 ENCOUNTER — Other Ambulatory Visit: Payer: Self-pay | Admitting: *Deleted

## 2013-06-25 DIAGNOSIS — IMO0002 Reserved for concepts with insufficient information to code with codable children: Secondary | ICD-10-CM

## 2013-06-25 DIAGNOSIS — E1065 Type 1 diabetes mellitus with hyperglycemia: Secondary | ICD-10-CM

## 2013-07-02 ENCOUNTER — Ambulatory Visit: Payer: Medicare Other | Admitting: "Endocrinology

## 2013-07-02 DIAGNOSIS — H3552 Pigmentary retinal dystrophy: Secondary | ICD-10-CM | POA: Diagnosis not present

## 2013-07-02 DIAGNOSIS — H53009 Unspecified amblyopia, unspecified eye: Secondary | ICD-10-CM | POA: Diagnosis not present

## 2013-07-02 DIAGNOSIS — E109 Type 1 diabetes mellitus without complications: Secondary | ICD-10-CM | POA: Diagnosis not present

## 2013-07-21 ENCOUNTER — Other Ambulatory Visit: Payer: Self-pay | Admitting: "Endocrinology

## 2013-07-31 ENCOUNTER — Other Ambulatory Visit: Payer: Self-pay | Admitting: *Deleted

## 2013-07-31 ENCOUNTER — Encounter: Payer: Self-pay | Admitting: "Endocrinology

## 2013-07-31 ENCOUNTER — Ambulatory Visit (INDEPENDENT_AMBULATORY_CARE_PROVIDER_SITE_OTHER): Payer: Medicare Other | Admitting: "Endocrinology

## 2013-07-31 VITALS — BP 124/86 | HR 93 | Wt 127.0 lb

## 2013-07-31 DIAGNOSIS — E1042 Type 1 diabetes mellitus with diabetic polyneuropathy: Secondary | ICD-10-CM

## 2013-07-31 DIAGNOSIS — E063 Autoimmune thyroiditis: Secondary | ICD-10-CM

## 2013-07-31 DIAGNOSIS — I498 Other specified cardiac arrhythmias: Secondary | ICD-10-CM | POA: Diagnosis not present

## 2013-07-31 DIAGNOSIS — IMO0002 Reserved for concepts with insufficient information to code with codable children: Secondary | ICD-10-CM

## 2013-07-31 DIAGNOSIS — E1049 Type 1 diabetes mellitus with other diabetic neurological complication: Secondary | ICD-10-CM | POA: Diagnosis not present

## 2013-07-31 DIAGNOSIS — E1169 Type 2 diabetes mellitus with other specified complication: Secondary | ICD-10-CM | POA: Diagnosis not present

## 2013-07-31 DIAGNOSIS — E11649 Type 2 diabetes mellitus with hypoglycemia without coma: Secondary | ICD-10-CM

## 2013-07-31 DIAGNOSIS — I10 Essential (primary) hypertension: Secondary | ICD-10-CM

## 2013-07-31 DIAGNOSIS — R000 Tachycardia, unspecified: Secondary | ICD-10-CM

## 2013-07-31 DIAGNOSIS — R634 Abnormal weight loss: Secondary | ICD-10-CM

## 2013-07-31 DIAGNOSIS — E049 Nontoxic goiter, unspecified: Secondary | ICD-10-CM

## 2013-07-31 DIAGNOSIS — E1065 Type 1 diabetes mellitus with hyperglycemia: Secondary | ICD-10-CM

## 2013-07-31 DIAGNOSIS — E038 Other specified hypothyroidism: Secondary | ICD-10-CM

## 2013-07-31 DIAGNOSIS — E1043 Type 1 diabetes mellitus with diabetic autonomic (poly)neuropathy: Secondary | ICD-10-CM

## 2013-07-31 DIAGNOSIS — E7800 Pure hypercholesterolemia, unspecified: Secondary | ICD-10-CM

## 2013-07-31 DIAGNOSIS — E1142 Type 2 diabetes mellitus with diabetic polyneuropathy: Secondary | ICD-10-CM

## 2013-07-31 DIAGNOSIS — G909 Disorder of the autonomic nervous system, unspecified: Secondary | ICD-10-CM

## 2013-07-31 LAB — COMPREHENSIVE METABOLIC PANEL
ALBUMIN: 3.9 g/dL (ref 3.5–5.2)
ALT: 8 U/L (ref 0–53)
AST: 10 U/L (ref 0–37)
Alkaline Phosphatase: 73 U/L (ref 39–117)
BUN: 14 mg/dL (ref 6–23)
CHLORIDE: 102 meq/L (ref 96–112)
CO2: 31 meq/L (ref 19–32)
Calcium: 9.4 mg/dL (ref 8.4–10.5)
Creat: 0.82 mg/dL (ref 0.50–1.35)
GLUCOSE: 269 mg/dL — AB (ref 70–99)
Potassium: 4.8 mEq/L (ref 3.5–5.3)
SODIUM: 138 meq/L (ref 135–145)
Total Bilirubin: 0.5 mg/dL (ref 0.2–1.2)
Total Protein: 6.7 g/dL (ref 6.0–8.3)

## 2013-07-31 LAB — HEMOGLOBIN A1C
Hgb A1c MFr Bld: 8.8 % — ABNORMAL HIGH (ref <5.7)
MEAN PLASMA GLUCOSE: 206 mg/dL — AB (ref <117)

## 2013-07-31 LAB — GLUCOSE, POCT (MANUAL RESULT ENTRY): POC Glucose: 237 mg/dl — AB (ref 70–99)

## 2013-07-31 LAB — TSH: TSH: 1.885 u[IU]/mL (ref 0.350–4.500)

## 2013-07-31 LAB — POCT GLYCOSYLATED HEMOGLOBIN (HGB A1C): HEMOGLOBIN A1C: 8.5

## 2013-07-31 LAB — T4, FREE: Free T4: 1.31 ng/dL (ref 0.80–1.80)

## 2013-07-31 LAB — T3, FREE: T3, Free: 3.4 pg/mL (ref 2.3–4.2)

## 2013-07-31 MED ORDER — ONETOUCH DELICA LANCETS 33G MISC: 1.0000 | Status: DC | PRN

## 2013-07-31 NOTE — Progress Notes (Signed)
Subjective:  Patient Name: [name] Date of Birth: 09/09/1985  MRN: [id number]  [name]  presents to the office today for follow-up of his T1DM, recurrent DKA, hypoglycemia, mental retardation, hypertension, goiter, thyroiditis, autonomic neuropathy and tachycardia, peripheral angiopathy, peripheral neuropathy, and visual impairment.   HISTORY OF PRESENT ILLNESS:   [name] is a 28 y.o. Caucasian young [name].  [name] was accompanied by his mother.   1. The patient was diagnosed with T1DM at age 28 in about 2001 or 2002. We have few details about his medical care until 2007. We know that in about 2006 he saw a pediatric endocrin[facility], [facility]. Unfortunately, the family chose not to return to see the endocrinologist. In about April of 2007, his primary care provider, Dr. Reed BreechPriebe of [facility], started the patient on an insulin pump. Unfortunately, neither the patient nor the family were capable of utilizing the pump's technology effectively. On 10/02/2005 the patient was [facility] for poorly controlled type 1 diabetes. I consulted on the patient at that time. I recognized that he had some type of syndrome that was causing mental retardation/organic brain syndrome. He did not have the insight or intelligence to perform the daily tasks of diabetes self-care independently or to utilize the insulin pump. It also appeared that there was not a great deal of adult supervision and support within his family. I changed his regimen to Novolog mix 70/30 insulin taken twice daily. He was to take 26 units each morning at breakfast and 14 units each evening at supper. I also gave him a sliding scale for Humalog lispro with different doses of insulin to be used at breakfast, lunch, supper, and bedtime in addition to his 70/30 insulin when his family members were available to check the  blood sugars and give the additional insulin. The patient's family members agreed to supervise his care.  2. Unfortunately, the patient has frequently not had the adult supervision and support that he needed. In the last 8 years, the patient has been scheduled to return to our clinic for follow up visits approximately every 3 months, but has actually only returned to our clinic for follow up visits 1-3 times per year. In 2012 he had 3 visits and was admitted once. In 2013 he had one admission in December, followed by a clinic visit later that month. In 2014 he had 4 clinic visits. Today is his first clinic visit in 2015.  3. His hemoglobin A1c values have been mostly in the 10.6-14.0% range. He has the following complications of diabetes: diabetic angiopathy, peripheral neuropathy, autonomic neuropathy, inappropriate sinus tachycardia, urinary microalbuminuria, and occasional but significant hypoglycemia. In November 2007, the patient was diagnosed with hypothyroidism secondary to Hashimoto's disease and was started on Synthroid at a dose of 25 mcg per day. In November 2008, he was diagnosed with hypertension and started on lisinopril, 5 mg per day. In July 2014 he was diagnosed with hypercholesterolemia and started on atorvastatin, 10 mg/day.  4. At the time of his last clinic visit on 03/29/13, the hemoglobin A1c was 10.7%.   A. In the interim, he has been healthy. Mom says that he was doing well for some time, but then lost his Medicaid coverage, but also subsequently had troubles receiving coverage from Medicare for insulins and test strips. The family has received some insulin samples from us. Mom will meet with Medicaid today to try to get his coverage  under Medicaid reinstated.  He says that he usually checks BGs twice a day, but he often does not. He says that he takes his thyroid medicine and BP medicine when he remembers. He was out of Synthroid for a while, but was able to re-start it about two  weeks ago.     B.  Things in the family are still often chaotic.  His aunt and grandmother have not been as available to check up on him.  He still sometimes scratches and excoriates his skin. He is still taking Novolog 70/30 insulin, 32 units in AM and 23 units in the PM, but misses doses at times. He often takes in very large amounts of carbs.  5. Pertinent Review of Systems:  Constitutional: The patient feels "good overall". His energy level is up and down. He gets bored quite easily.   Eyes: Vision is a little better now with his glasses. He is legally blind. He had his annual eye exam in February 2014. There were no signs of diabetic retinopathy.  Neck: The patient has no complaints of anterior neck swelling, soreness, tenderness,  pressure, discomfort, or difficulty swallowing.  Heart: The patient has no complaints of palpitations, irregular heat beats, chest pain, or chest pressure. Gastrointestinal: Bowel movents seem normal. The patient has no other complaints of excessive hunger, acid reflux, upset stomach, stomach aches or pains, diarrhea, or constipation. Legs: Muscle mass and strength seem normal. There are no complaints of numbness, tingling, burning, or pain. No edema is noted. Feet: There are no obvious foot problems. There are no recent complaints of numbness, tingling, burning, or pain. No edema is noted. GU: He has not been urinating excessively very often.   Hypoglycemia: He has had several low BGs, especially if he does not eat on time or eat enough carbs.      6. BG meter review: He missed checking BGs for 2-3 days at a time. BGs varied from 56-> 600. Most BGs are in the range of 250-390. When the BGs are in the 400-Hi range, those high BGs are most likely due to not taking insulin.  His mean BG was 295 for the past 4 weeks.  PAST MEDICAL, FAMILY, AND SOCIAL HISTORY:  Past Medical History  Diagnosis Date  . Diabetes mellitus     Type 1, diagnosed age 37, with frequent DKA  admissions, difficult to control due to MR  . Hypothyroidism   . Diabetic nephropathy     Started on Lisinopril 5 mg  . 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 Impaired cognition     mention of mental retardation  . Goiter   . Moderate or severe vision impairment, both eyes, impairment level not further specified   . Mental retardation   . Hypoglycemia associated with diabetes   . Hypertension   . Thyroiditis, autoimmune   . Angiopathy, diabetic   . Diabetic peripheral neuropathy   . Dysmorphic features     Family History  Problem Relation Age of Onset  . Vision loss Father   . Diabetes Maternal Grandmother     AODM  . Diabetes Paternal Grandmother     AODM  . Diabetes Cousin     Second cousin has juvenile-onset DM.  Marland Kitchen Retinitis pigmentosa Mother     Current outpatient prescriptions:Insulin Aspart Prot & Aspart (NOVOLOG MIX 70/30 FLEXPEN) (70-30) 100 UNIT/ML SUPN, 33 units in morning and 23 units at supper, Disp: 6 pen, Rfl: 6;  Lancets (ACCU-CHEK MULTICLIX) lancets, USE AS DIRECTED TO TEST BLOOD GLUCOSE LEVELS, Disp: 204 each, Rfl: 0;  levothyroxine (SYNTHROID, LEVOTHROID) 25 MCG tablet, Take 25 mcg by mouth daily., Disp: , Rfl:  lisinopril (PRINIVIL,ZESTRIL) 10 MG tablet, TAKE 1 TABLET BY MOUTH ONCE A DAY, Disp: 30 tablet, Rfl: 0;  ONETOUCH VERIO test strip, CHECK BLOOD SUGAR 10 X DAILY, Disp: 300 each, Rfl: 6;  atorvastatin (LIPITOR) 10 MG tablet, Take 1 tablet (10 mg total) by mouth daily., Disp: 90 tablet, Rfl: 4  Allergies as of 07/31/2013  . (No Known Allergies)    1. Work and Family:  Since being laid off from his job at the [employer], he has been unemployed. 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 He would like to go back to work. He is still living at home.   2. Activities: He walks occasionally with his grandmother. 3. Smoking, alcohol, or drugs: No tobacco or alcohol or drugs.  4. Primary Care Provider: None at present  REVIEW OF SYSTEMS:  There are no other significant problems involving Neiman's other body systems.   Objective:  Vital Signs:  BP 124/86  Pulse 93  Wt 127 lb (57.607 kg)   Ht Readings from Last 3 Encounters:  05/10/12 5\' 5"  (1.651 m)  04/30/11 5\' 5"  (1.651 m)   Wt Readings from Last 3 Encounters:  07/31/13 127 lb (57.607 kg)  03/29/13 120 lb 6.4 oz (54.613 kg)  12/18/12 123 lb (55.792 kg)   There is no height on file to calculate BSA.  Normalized stature-for-age data available only for age 52 to 20 years. Normalized weight-for-age data available only for age 52 to 20 years.  PHYSICAL EXAM:  Constitutional: The patient appears fairly healthy and well nourished today.  He has gained 7 pounds since his last visit, 7 pounds in 4 months. He looks more rested. He sat passively through most of the visit, but did ask some fairly intelligent questions. His thought processes are very concrete. His insight remains poor. He really does not understand or comprehend very much about diabetes, about why he needs to take care of himself, and about what will happen to him if he does not take care of himself. His understanding about his diabetes and nutrition is very primitive. If left unsupervised for very long, he does not check his BGs reliably and does not take his insulin doses reliably.  Face: The face appears somewhat dysmorphic.  Eyes: There is no obvious arcus or proptosis. His eyes are fairly moist.   Mouth: The oropharynx and tongue appear normal. His mouth is fairly moist. His oral hygiene is better today. Neck: The neck appears to be visibly normal. No carotid bruits are noted. The thyroid gland is a bit smaller at 20-20+ grams in size. The left lobe is just a bit enlarged. The right lobe is normal today. The consistency of the thyroid gland is normal today. The thyroid gland is not tender to palpation. Lungs: The lungs are clear to auscultation. Air movement is good. Heart: Heart rate and rhythm are  regular. Heart sounds S1 and S2 are normal. I did not appreciate any pathologic cardiac murmurs. Abdomen: The abdomen is normal in size. Bowel sounds are normal. There is no obvious hepatomegaly, splenomegaly, or other mass effect.  Arms: Muscle size and bulk are normal for age. Hands: There is no obvious tremor. Phalangeal and metacarpophalangeal joints are normal. Palmar muscles are normal. Palmar skin is normal. Palmar moisture is also normal. Hands are cool  to the touch. Legs: Muscles appear normal for age. No edema is present. Feet: Feet are normally formed, but are cool to touch. Dorsalis pedal pulses are faint 1+ bilaterally. Neurologic: Strength is normal for age in both the upper and lower extremities. Muscle tone is normal. Sensation to touch is normal in both the legs and feet.    LAB DATA: Hemoglobin A1c today was 8.5% today, compared with 10.7%, at last visit and with 10.6% at the visit prior.  Labs 7/018/14: CMP normal, except glucose of 14; cholesterol 202, triglycerides 85, HDL 58, LDL 127; urinary microalbumin/creatinine ratio 51.9; TSH 2.993, free T4 1.32, free T3 2.8  05/10/12: Creatinine 0.62, TSH 1.025,            Assessment and Plan:   ASSESSMENT:  1. Diabetes mellitus: The patient's recent hemoglobin A1c is better and correlates well with his BG printout. He is not having as many high BGs as he did at last visit. His gain in weight also indicates that he has been receiving more insulin. .  2. Hypertension: Blood pressure is higher today. He is missing too many doses of lisinopril.  3. Peripheral neuropathy: This problem has improved and is not evident today, but will certainly recur if he does not get his BGs under better control.   4. Angiopathy: Patient is asymptomatic now. If he exercises on a regular basis and controls his BGs, blood flow in his legs and feet will improve. 5. Goiter: Thyroid gland is smaller today. The waxing and waning of thyroid gland size is c/w  evolving Hashimoto's disease.  6. Hypoglycemia: This does not occur often, but is more likely to occur if he takes insulin and doesn't eat. .  7. Hypothyroid: Since he was out of Synthroid for awhile and has also missed doses, I can't predict how his BGs will be. When he takes his Synthroid he also takes his lisinopril. At other times, however, he does not take either pill.  8. Autonomic neuropathy and tachycardia: These problems have improved, c/w his improvement in BGs.   9. Hypercholesterolemia: Cholesterol and LDL were worse in July. He is under treatment now, if he actually takes the medication. 10. Weight loss, unintentional: This process has improved due to taking more insulin.  11. Dehydration: He is re-hydrated today.  12. Medical neglect: His family is doing a better job of supporting his medical care.    PLAN: 1. Diagnostic: Surveillance labs today. HbA1c.  Call in 2 weeks to discuss BGs.  2. Therapeutic: Family members must actively supervise his insulin doses and BG checks. Continue 70/30 insulin at 32 units each morning and 23 units at supper. If he does not eat much at breakfast or supper, reduce the insulin dose by 3 units. Try to fit in a walk every day of 30-60 minutes. Take atorvastatin, Synthroid, and lisinopril daily.  3. Patient education: Please cooperate with your mother, sister, aunt, and grandmother.  4. Follow-up: three  Months  Level of Service: This visit lasted in excess of 50 minutes. More than 50% of the visit was devoted to counseling.  David Stall, MD 07/31/2013 1:35 PM

## 2013-07-31 NOTE — Patient Instructions (Signed)
Follow up visit in 3 months. Call Dr. Fransico Kiyo Heal in two weeks with BG results.

## 2013-08-01 LAB — MICROALBUMIN / CREATININE URINE RATIO
CREATININE, URINE: 124.7 mg/dL
MICROALB UR: 6.44 mg/dL — AB (ref 0.00–1.89)
Microalb Creat Ratio: 51.6 mg/g — ABNORMAL HIGH (ref 0.0–30.0)

## 2013-09-06 ENCOUNTER — Encounter: Payer: Self-pay | Admitting: *Deleted

## 2013-09-15 ENCOUNTER — Other Ambulatory Visit: Payer: Self-pay | Admitting: "Endocrinology

## 2013-09-17 ENCOUNTER — Other Ambulatory Visit: Payer: Self-pay | Admitting: "Endocrinology

## 2013-10-05 ENCOUNTER — Emergency Department (HOSPITAL_COMMUNITY): Payer: Medicare Other

## 2013-10-05 ENCOUNTER — Emergency Department (HOSPITAL_COMMUNITY)
Admission: EM | Admit: 2013-10-05 | Discharge: 2013-10-05 | Disposition: A | Discharge status: Home / Self Care | Payer: Medicare Other | Attending: Emergency Medicine | Admitting: Emergency Medicine

## 2013-10-05 ENCOUNTER — Encounter (HOSPITAL_COMMUNITY): Payer: Self-pay | Admitting: Emergency Medicine

## 2013-10-05 DIAGNOSIS — I10 Essential (primary) hypertension: Secondary | ICD-10-CM | POA: Insufficient documentation

## 2013-10-05 DIAGNOSIS — I798 Other disorders of arteries, arterioles and capillaries in diseases classified elsewhere: Secondary | ICD-10-CM | POA: Insufficient documentation

## 2013-10-05 DIAGNOSIS — M722 Plantar fascial fibromatosis: Secondary | ICD-10-CM

## 2013-10-05 DIAGNOSIS — Z8659 Personal history of other mental and behavioral disorders: Secondary | ICD-10-CM | POA: Insufficient documentation

## 2013-10-05 DIAGNOSIS — Z791 Long term (current) use of non-steroidal anti-inflammatories (NSAID): Secondary | ICD-10-CM | POA: Diagnosis not present

## 2013-10-05 DIAGNOSIS — Z7982 Long term (current) use of aspirin: Secondary | ICD-10-CM | POA: Diagnosis not present

## 2013-10-05 DIAGNOSIS — Q897 Multiple congenital malformations, not elsewhere classified: Secondary | ICD-10-CM | POA: Insufficient documentation

## 2013-10-05 DIAGNOSIS — E1049 Type 1 diabetes mellitus with other diabetic neurological complication: Secondary | ICD-10-CM | POA: Diagnosis not present

## 2013-10-05 DIAGNOSIS — Z79899 Other long term (current) drug therapy: Secondary | ICD-10-CM | POA: Diagnosis not present

## 2013-10-05 DIAGNOSIS — E1142 Type 2 diabetes mellitus with diabetic polyneuropathy: Secondary | ICD-10-CM | POA: Diagnosis not present

## 2013-10-05 DIAGNOSIS — M79609 Pain in unspecified limb: Secondary | ICD-10-CM | POA: Diagnosis not present

## 2013-10-05 DIAGNOSIS — Z794 Long term (current) use of insulin: Secondary | ICD-10-CM | POA: Insufficient documentation

## 2013-10-05 DIAGNOSIS — E039 Hypothyroidism, unspecified: Secondary | ICD-10-CM | POA: Insufficient documentation

## 2013-10-05 MED ORDER — IBUPROFEN 800 MG PO TABS: 800.0000 mg | ORAL_TABLET | Freq: Three times a day (TID) | ORAL | Status: DC

## 2013-10-05 NOTE — ED Notes (Signed)
Pt. reports left heel pain for 3 days , denies injury or fall , pain radiating to left calf , skin intact/no swelling. Ambulatory.

## 2013-10-05 NOTE — Discharge Instructions (Signed)
Plantar Fasciitis (Heel Spur Syndrome)  with Rehab  The plantar fascia is a fibrous, ligament-like, soft-tissue structure that spans the bottom of the foot. Plantar fasciitis is a condition that causes pain in the foot due to inflammation of the tissue.  SYMPTOMS   · Pain and tenderness on the underneath side of the foot.  · Pain that worsens with standing or walking.  CAUSES   Plantar fasciitis is caused by irritation and injury to the plantar fascia on the underneath side of the foot. Common mechanisms of injury include:  · Direct trauma to bottom of the foot.  · Damage to a small nerve that runs under the foot where the main fascia attaches to the heel bone.  · Stress placed on the plantar fascia due to bone spurs.  RISK INCREASES WITH:   · Activities that place stress on the plantar fascia (running, jumping, pivoting, or cutting).  · Poor strength and flexibility.  · Improperly fitted shoes.  · Tight calf muscles.  · Flat feet.  · Failure to warm-up properly before activity.  · Obesity.  PREVENTION  · Warm up and stretch properly before activity.  · Allow for adequate recovery between workouts.  · Maintain physical fitness:  · Strength, flexibility, and endurance.  · Cardiovascular fitness.  · Maintain a health body weight.  · Avoid stress on the plantar fascia.  · Wear properly fitted shoes, including arch supports for individuals who have flat feet.  PROGNOSIS   If treated properly, then the symptoms of plantar fasciitis usually resolve without surgery. However, occasionally surgery is necessary.  RELATED COMPLICATIONS   · Recurrent symptoms that may result in a chronic condition.  · Problems of the lower back that are caused by compensating for the injury, such as limping.  · Pain or weakness of the foot during push-off following surgery.  · Chronic inflammation, scarring, and partial or complete fascia tear, occurring more often from repeated injections.  TREATMENT   Treatment initially involves the use of  ice and medication to help reduce pain and inflammation. The use of strengthening and stretching exercises may help reduce pain with activity, especially stretches of the Achilles tendon. These exercises may be performed at home or with a therapist. Your caregiver may recommend that you use heel cups of arch supports to help reduce stress on the plantar fascia. Occasionally, corticosteroid injections are given to reduce inflammation. If symptoms persist for greater than 6 months despite non-surgical (conservative), then surgery may be recommended.   MEDICATION   · If pain medication is necessary, then nonsteroidal anti-inflammatory medications, such as aspirin and ibuprofen, or other minor pain relievers, such as acetaminophen, are often recommended.  · Do not take pain medication within 7 days before surgery.  · Prescription pain relievers may be given if deemed necessary by your caregiver. Use only as directed and only as much as you need.  · Corticosteroid injections may be given by your caregiver. These injections should be reserved for the most serious cases, because they may only be given a certain number of times.  HEAT AND COLD  · Cold treatment (icing) relieves pain and reduces inflammation. Cold treatment should be applied for 10 to 15 minutes every 2 to 3 hours for inflammation and pain and immediately after any activity that aggravates your symptoms. Use ice packs or massage the area with a piece of ice (ice massage).  · Heat treatment may be used prior to performing the stretching and strengthening activities prescribed   by your caregiver, physical therapist, or athletic trainer. Use a heat pack or soak the injury in warm water.  SEEK IMMEDIATE MEDICAL CARE IF:  · Treatment seems to offer no benefit, or the condition worsens.  · Any medications produce adverse side effects.  EXERCISES  RANGE OF MOTION (ROM) AND STRETCHING EXERCISES - Plantar Fasciitis (Heel Spur Syndrome)  These exercises may help you  when beginning to rehabilitate your injury. Your symptoms may resolve with or without further involvement from your physician, physical therapist or athletic trainer. While completing these exercises, remember:   · Restoring tissue flexibility helps normal motion to return to the joints. This allows healthier, less painful movement and activity.  · An effective stretch should be held for at least 30 seconds.  · A stretch should never be painful. You should only feel a gentle lengthening or release in the stretched tissue.  RANGE OF MOTION - Toe Extension, Flexion  · Sit with your right / left leg crossed over your opposite knee.  · Grasp your toes and gently pull them back toward the top of your foot. You should feel a stretch on the bottom of your toes and/or foot.  · Hold this stretch for __________ seconds.  · Now, gently pull your toes toward the bottom of your foot. You should feel a stretch on the top of your toes and or foot.  · Hold this stretch for __________ seconds.  Repeat __________ times. Complete this stretch __________ times per day.   RANGE OF MOTION - Ankle Dorsiflexion, Active Assisted  · Remove shoes and sit on a chair that is preferably not on a carpeted surface.  · Place right / left foot under knee. Extend your opposite leg for support.  · Keeping your heel down, slide your right / left foot back toward the chair until you feel a stretch at your ankle or calf. If you do not feel a stretch, slide your bottom forward to the edge of the chair, while still keeping your heel down.  · Hold this stretch for __________ seconds.  Repeat __________ times. Complete this stretch __________ times per day.   STRETCH  Gastroc, Standing  · Place hands on wall.  · Extend right / left leg, keeping the front knee somewhat bent.  · Slightly point your toes inward on your back foot.  · Keeping your right / left heel on the floor and your knee straight, shift your weight toward the wall, not allowing your back to  arch.  · You should feel a gentle stretch in the right / left calf. Hold this position for __________ seconds.  Repeat __________ times. Complete this stretch __________ times per day.  STRETCH  Soleus, Standing  · Place hands on wall.  · Extend right / left leg, keeping the other knee somewhat bent.  · Slightly point your toes inward on your back foot.  · Keep your right / left heel on the floor, bend your back knee, and slightly shift your weight over the back leg so that you feel a gentle stretch deep in your back calf.  · Hold this position for __________ seconds.  Repeat __________ times. Complete this stretch __________ times per day.  STRETCH  Gastrocsoleus, Standing   Note: This exercise can place a lot of stress on your foot and ankle. Please complete this exercise only if specifically instructed by your caregiver.   · Place the ball of your right / left foot on a step, keeping   your other foot firmly on the same step.  · Hold on to the wall or a rail for balance.  · Slowly lift your other foot, allowing your body weight to press your heel down over the edge of the step.  · You should feel a stretch in your right / left calf.  · Hold this position for __________ seconds.  · Repeat this exercise with a slight bend in your right / left knee.  Repeat __________ times. Complete this stretch __________ times per day.   STRENGTHENING EXERCISES - Plantar Fasciitis (Heel Spur Syndrome)   These exercises may help you when beginning to rehabilitate your injury. They may resolve your symptoms with or without further involvement from your physician, physical therapist or athletic trainer. While completing these exercises, remember:   · Muscles can gain both the endurance and the strength needed for everyday activities through controlled exercises.  · Complete these exercises as instructed by your physician, physical therapist or athletic trainer. Progress the resistance and repetitions only as guided.  STRENGTH - Towel  Curls  · Sit in a chair positioned on a non-carpeted surface.  · Place your foot on a towel, keeping your heel on the floor.  · Pull the towel toward your heel by only curling your toes. Keep your heel on the floor.  · If instructed by your physician, physical therapist or athletic trainer, add ____________________ at the end of the towel.  Repeat __________ times. Complete this exercise __________ times per day.  STRENGTH - Ankle Inversion  · Secure one end of a rubber exercise band/tubing to a fixed object (table, pole). Loop the other end around your foot just before your toes.  · Place your fists between your knees. This will focus your strengthening at your ankle.  · Slowly, pull your big toe up and in, making sure the band/tubing is positioned to resist the entire motion.  · Hold this position for __________ seconds.  · Have your muscles resist the band/tubing as it slowly pulls your foot back to the starting position.  Repeat __________ times. Complete this exercises __________ times per day.   Document Released: 05/10/2005 Document Revised: 08/02/2011 Document Reviewed: 08/22/2008  [employer]® Patient Information ©2014 [employer].

## 2013-10-05 NOTE — ED Provider Notes (Signed)
CSN: [id number]     Arrival date & time 10/05/13  1957 History   First MD Initiated Contact with Patient 10/05/13 2002     Chief Complaint  Patient presents with  . Foot Pain     (Consider location/radiation/quality/duration/timing/severity/associated sxs/prior Treatment) HPI Comments: Patient presents to the emergency department with chief complaint of left heel pain. States the pain has been present for the past 3 days. The pain is worsened when he dorsiflexes. He denies any injuries or falls. He states that he has not had pain like this before. He is tried taking Goody powder with good relief.  The history is provided by the patient. No language interpreter was used.    Past Medical History  Diagnosis Date  . Diabetes mellitus     Type 1, diagnosed age 28, with frequent DKA admissions, difficult to control due to MR  . Hypothyroidism   . Diabetic nephropathy     Started on Lisinopril 5 mg  . 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 Impaired cognition     mention of mental retardation  . Goiter   . Moderate or severe vision impairment, both eyes, impairment level not further specified   . Mental retardation   . Hypoglycemia associated with diabetes   . Hypertension   . Thyroiditis, autoimmune   . Angiopathy, diabetic   . Diabetic peripheral neuropathy   . Dysmorphic features    History reviewed. No pertinent past surgical history. Family History  Problem Relation Age of Onset  . Vision loss Father   . Diabetes Maternal Grandmother     AODM  . Diabetes Paternal Grandmother     AODM  . Diabetes Cousin     Second cousin has juvenile-onset DM.  Marland Kitchen. Retinitis pigmentosa Mother    History  Substance Use Topics  . Smoking status: Never Smoker   . Smokeless tobacco: Not on file  . Alcohol Use: No     Comment: Rarely consumes alcohol, perhaps once or twice per year.    Review of Systems  Constitutional: Negative for fever and chills.  Respiratory:  Negative for shortness of breath.   Cardiovascular: Negative for chest pain.  Gastrointestinal: Negative for nausea, vomiting, diarrhea and constipation.  Genitourinary: Negative for dysuria.  Musculoskeletal: Positive for arthralgias.      Allergies  Review of patient's allergies indicates no known allergies.  Home Medications   Prior to Admission medications   Medication Sig Start Date End Date Taking? Authorizing Provider  Aspirin-Acetaminophen-Caffeine (GOODY HEADACHE PO) Take 1 packet by mouth daily as needed (for pain).   Yes Historical Provider, MD  atorvastatin (LIPITOR) 10 MG tablet Take 1 tablet (10 mg total) by mouth daily. 03/15/13 03/15/14 Yes David StallMichael J Brennan, MD  ibuprofen (ADVIL,MOTRIN) 200 MG tablet Take 400-800 mg by mouth every 6 (six) hours as needed for moderate pain.   Yes Historical Provider, MD  Insulin Aspart Prot & Aspart (NOVOLOG MIX 70/30 FLEXPEN) (70-30) 100 UNIT/ML SUPN 33 units in morning and 23 units at supper 03/29/13  Yes David StallMichael J Brennan, MD  Lancets (ACCU-CHEK MULTICLIX) lancets USE AS DIRECTED TO TEST BLOOD GLUCOSE LEVELS 11/28/12  Yes David StallMichael J Brennan, MD  levothyroxine (SYNTHROID, LEVOTHROID) 25 MCG tablet Take 25 mcg by mouth daily before breakfast.   Yes Historical Provider, MD  lisinopril (PRINIVIL,ZESTRIL) 10 MG tablet Take 10 mg by mouth daily.   Yes Historical Provider, MD  [facility] DELICA LANCETS 33G MISC 1 each by  Does not apply route as needed. 07/31/13  Yes David StallMichael J Brennan, MD  ONETOUCH VERIO test strip CHECK BLOOD SUGAR 10 X DAILY   Yes David StallMichael J Brennan, MD   BP 149/95  Pulse 87  Temp(Src) 97.9 F (36.6 C) (Oral)  Resp 16  SpO2 100% Physical Exam  Nursing note and vitals reviewed. Constitutional: He is oriented to person, place, and time. He appears well-developed and well-nourished.  HENT:  Head: Normocephalic and atraumatic.  Eyes: Conjunctivae and EOM are normal.  Neck: Normal range of motion.  Cardiovascular: Normal rate.    Pulmonary/Chest: Effort normal.  Abdominal: He exhibits no distension.  Musculoskeletal: Normal range of motion.  Tenderness to palpation over the left plantar fascia, no bony abnormality or deformity, range of motion and strength 5/5, no calf tenderness, no unilateral leg swelling, no evidence of septic joint, or DVT  Neurological: He is alert and oriented to person, place, and time.  Skin: Skin is dry.  Psychiatric: He has a normal mood and affect. His behavior is normal. Judgment and thought content normal.    ED Course  Procedures (including critical care time) Labs Review Labs Reviewed - No data to display  Imaging Review No results found.   EKG Interpretation None      MDM   Final diagnoses:  Plantar fasciitis    Patient with heel pain. Suspect plantar fasciitis. Plain films are negative. Will discharge to home with orthopedic followup. Recommend shoe orthotics, ibuprofen, and rehabilitation exercises. Patient understands and agrees with the plan. He is stable and ready for discharge.   Roxy Horsemanobert Daymond Cordts, PA-C 10/05/13 2204

## 2013-10-06 NOTE — ED Provider Notes (Signed)
Medical screening examination/treatment/procedure(s) were performed by non-physician practitioner and as supervising physician I was immediately available for consultation/collaboration.  Petrina Melby L Daniele Yankowski, MD 10/06/13 0130 

## 2013-11-03 ENCOUNTER — Other Ambulatory Visit: Payer: Self-pay | Admitting: "Endocrinology

## 2013-11-07 ENCOUNTER — Ambulatory Visit: Payer: Medicare Other | Admitting: "Endocrinology

## 2013-12-20 ENCOUNTER — Other Ambulatory Visit: Payer: Self-pay | Admitting: *Deleted

## 2013-12-20 DIAGNOSIS — E038 Other specified hypothyroidism: Secondary | ICD-10-CM

## 2013-12-20 MED ORDER — LEVOTHYROXINE SODIUM 25 MCG PO TABS: 25.0000 ug | ORAL_TABLET | Freq: Every day | ORAL | Status: DC

## 2013-12-29 ENCOUNTER — Other Ambulatory Visit: Payer: Self-pay | Admitting: "Endocrinology

## 2014-01-23 ENCOUNTER — Ambulatory Visit: Payer: Medicare Other | Admitting: "Endocrinology

## 2014-02-19 ENCOUNTER — Encounter: Payer: Self-pay | Admitting: "Endocrinology

## 2014-02-19 ENCOUNTER — Ambulatory Visit (INDEPENDENT_AMBULATORY_CARE_PROVIDER_SITE_OTHER): Payer: Medicare Other | Admitting: "Endocrinology

## 2014-02-19 VITALS — BP 136/94 | HR 82 | Wt 124.8 lb

## 2014-02-19 DIAGNOSIS — K141 Geographic tongue: Secondary | ICD-10-CM

## 2014-02-19 DIAGNOSIS — E7800 Pure hypercholesterolemia, unspecified: Secondary | ICD-10-CM

## 2014-02-19 DIAGNOSIS — E1043 Type 1 diabetes mellitus with diabetic autonomic (poly)neuropathy: Secondary | ICD-10-CM

## 2014-02-19 DIAGNOSIS — K3184 Gastroparesis: Secondary | ICD-10-CM

## 2014-02-19 DIAGNOSIS — T7401XA Adult neglect or abandonment, confirmed, initial encounter: Secondary | ICD-10-CM

## 2014-02-19 DIAGNOSIS — E162 Hypoglycemia, unspecified: Secondary | ICD-10-CM

## 2014-02-19 DIAGNOSIS — E1065 Type 1 diabetes mellitus with hyperglycemia: Secondary | ICD-10-CM | POA: Diagnosis not present

## 2014-02-19 DIAGNOSIS — E1142 Type 2 diabetes mellitus with diabetic polyneuropathy: Secondary | ICD-10-CM

## 2014-02-19 DIAGNOSIS — E038 Other specified hypothyroidism: Secondary | ICD-10-CM

## 2014-02-19 DIAGNOSIS — E10649 Type 1 diabetes mellitus with hypoglycemia without coma: Secondary | ICD-10-CM

## 2014-02-19 DIAGNOSIS — E1069 Type 1 diabetes mellitus with other specified complication: Secondary | ICD-10-CM | POA: Diagnosis not present

## 2014-02-19 DIAGNOSIS — T7401XD Adult neglect or abandonment, confirmed, subsequent encounter: Secondary | ICD-10-CM

## 2014-02-19 DIAGNOSIS — E1143 Type 2 diabetes mellitus with diabetic autonomic (poly)neuropathy: Secondary | ICD-10-CM

## 2014-02-19 DIAGNOSIS — IMO0002 Reserved for concepts with insufficient information to code with codable children: Secondary | ICD-10-CM | POA: Diagnosis not present

## 2014-02-19 DIAGNOSIS — Z23 Encounter for immunization: Secondary | ICD-10-CM | POA: Diagnosis not present

## 2014-02-19 DIAGNOSIS — E1149 Type 2 diabetes mellitus with other diabetic neurological complication: Secondary | ICD-10-CM

## 2014-02-19 DIAGNOSIS — E1049 Type 1 diabetes mellitus with other diabetic neurological complication: Secondary | ICD-10-CM

## 2014-02-19 DIAGNOSIS — E1042 Type 1 diabetes mellitus with diabetic polyneuropathy: Secondary | ICD-10-CM

## 2014-02-19 DIAGNOSIS — E1151 Type 2 diabetes mellitus with diabetic peripheral angiopathy without gangrene: Secondary | ICD-10-CM

## 2014-02-19 DIAGNOSIS — I10 Essential (primary) hypertension: Secondary | ICD-10-CM

## 2014-02-19 DIAGNOSIS — R000 Tachycardia, unspecified: Secondary | ICD-10-CM

## 2014-02-19 DIAGNOSIS — I798 Other disorders of arteries, arterioles and capillaries in diseases classified elsewhere: Secondary | ICD-10-CM

## 2014-02-19 DIAGNOSIS — I498 Other specified cardiac arrhythmias: Secondary | ICD-10-CM

## 2014-02-19 DIAGNOSIS — Z5189 Encounter for other specified aftercare: Secondary | ICD-10-CM

## 2014-02-19 DIAGNOSIS — E063 Autoimmune thyroiditis: Secondary | ICD-10-CM

## 2014-02-19 DIAGNOSIS — G909 Disorder of the autonomic nervous system, unspecified: Secondary | ICD-10-CM

## 2014-02-19 DIAGNOSIS — E1159 Type 2 diabetes mellitus with other circulatory complications: Secondary | ICD-10-CM

## 2014-02-19 LAB — GLUCOSE, POCT (MANUAL RESULT ENTRY): POC Glucose: 418 mg/dl — AB (ref 70–99)

## 2014-02-19 LAB — POCT GLYCOSYLATED HEMOGLOBIN (HGB A1C): Hemoglobin A1C: 10.2

## 2014-02-19 MED ORDER — ONETOUCH DELICA LANCETS 33G MISC: Status: DC

## 2014-02-19 NOTE — Patient Instructions (Addendum)
Follow up visit in 3 months. Check BGs at breakfast, dinner, and bedtime. Take Novolog 70/30 insulin at breakfast and dinner. If the BG at bedtime is < 200, take the appropriate snack. Please take Synthroid and lisinopril before breakfast. Please take a good multivitamin pill, such as Centrum Silver or One-A -Day Silver at dinner. Please take atorvastatin between dinner time and bedtime.

## 2014-02-19 NOTE — Progress Notes (Signed)
Subjective:  Patient Name: [name] Date of Birth: Mar 20, 1986  MRN: [id number]  Shelby Anderle  presents to the office today for follow-up of his T1DM, recurrent DKA, hypoglycemia, mental retardation, hypertension, goiter, thyroiditis, autonomic neuropathy and tachycardia, peripheral angiopathy, peripheral neuropathy, visual impairment, non-compliance, and medical neglect.  HISTORY OF PRESENT ILLNESS:   Ayodele is a 28 y.o. Caucasian young man.  Avram was accompanied by his mother.   1. The patient was diagnosed with T1DM at age 36 in about 2001 or 2002. We have few details about his medical care until 2007. We know that in about 2006 he saw a pediatric endocrinologist at [facility], [facility], [facility]. Unfortunately, the family chose not to return to see the endocrinologist. In about April of 2007, his primary care provider, Dr. Reed Breech of [facility], started the patient on an insulin pump. Unfortunately, neither the patient nor the family were capable of utilizing the pump's technology effectively. On 10/02/2005 the patient was admitted to the [facility] for poorly controlled type 1 diabetes. I consulted on the patient at that time. I recognized that he had some type of syndrome that was causing mental retardation/organic brain syndrome. He did not have the insight or intelligence to perform the daily tasks of diabetes self-care independently or to utilize the insulin pump. It also appeared that there was not a great deal of adult supervision and support within his family. I changed his regimen to Novolog mix 70/30 insulin taken twice daily. He was to take 26 units each morning at breakfast and 14 units each evening at supper. I also gave him a sliding scale for Humalog lispro with different doses of insulin to be used at breakfast, lunch, supper, and bedtime in addition to his 70/30 insulin when his family  members were available to check the blood sugars and give the additional insulin. The patient's family members agreed to supervise his care.  2. Unfortunately, the patient has frequently not had the adult supervision and support that he needed. In the last 8 years, the patient has been scheduled to return to our clinic for follow up visits approximately every 3 months, but has actually only returned to our clinic for follow up visits 1-3 times per year. In 2012 he had 3 visits and was admitted once. In 2013 he had one admission in December, followed by a clinic visit later that month. In 2014 he had 4 clinic visits. Today is his second clinic visit in 2015. In fairness, however, I should note that I was unavailable to see him for two months due to a severe leg injury and post-operative recuperation.   3. His hemoglobin A1c values have been mostly in the 10.6-14.0% range. He has the following complications of diabetes: diabetic angiopathy, peripheral neuropathy, autonomic neuropathy, inappropriate sinus tachycardia, urinary microalbuminuria, and occasional but significant hypoglycemia. In November 2007, the patient was diagnosed with hypothyroidism secondary to Hashimoto's disease and was started on Synthroid at a dose of 25 mcg per day. In November 2008, he was diagnosed with hypertension and started on lisinopril, 5 mg per day. In July 2014 he was diagnosed with hypercholesterolemia and started on atorvastatin, 10 mg/day.  4. At the time of his last clinic visit on 07/31/13, the hemoglobin A1c was 8.8%.   A. In the interim, he has been healthy. Mom says that he goes back and forth in terms of checking BGs and taking insulins. Some days he does fairly well, but  some days he does poorly. He also frequently skips his Synthroid, atorvastatin,  and lisinopril. He does have Medicaid coverage now.      B.  Things in the family are still often chaotic.  His parents and sister try to supervise [name]' DM care, but  there are many gaps in that supervision. He still sometimes scratches and excoriates his skin. He is still taking Novolog 70/30 insulin, 32 units in AM and 23 units in the PM, but misses doses at times. He often takes in very large amounts of carbs.  5. Pertinent Review of Systems:  Constitutional: The patient feels "good overall". He was more tired and depressed about 6 weeks ago, but is doing better now.   Eyes: Vision is a little better now with his glasses. He is legally blind. He had his annual eye exam in February 2014. There were no signs of diabetic retinopathy.  Neck: The patient has no complaints of anterior neck swelling, soreness, tenderness,  pressure, discomfort, or difficulty swallowing.  Heart: The patient has no complaints of palpitations, irregular heat beats, chest pain, or chest pressure. Gastrointestinal: He often has bloating after meals. When he has bloating he has more nausea. Bowel movents seem normal. The patient has no other complaints of excessive hunger, acid reflux, stomach aches or pains, diarrhea, or constipation. Legs: Muscle mass and strength seem normal. There are no complaints of numbness, tingling, burning, or pain. No edema is noted. Feet: There are no obvious foot problems. There are no recent complaints of numbness, tingling, burning, or pain. No edema is noted. GU: He has been having nocturia more frequently.    Hypoglycemia: He has not had many low BGs.     6. BG meter review: He missed checking BGs for 1-6 days at a time. He failed to check any BGs on 14/28 days in this month. BGs varied from 51-514, compared with 56-> 600 at last visit. Most BGs are in the range of 250-400.  His mean BG was 301.9, compared with 295 at his last visit. His low BGs of 51, 61, and 68 all occurred in the mornings upon awakening, always following failure to check BGs at bedtime and to take a snack if the BG is < 200.   PAST MEDICAL, FAMILY, AND SOCIAL HISTORY:  Past Medical  History  Diagnosis Date  . Diabetes mellitus     Type 1, diagnosed age 3, with frequent DKA admissions, difficult to control due to MR  . Hypothyroidism   . Diabetic nephropathy     Started on Lisinopril 5 mg  . 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 Impaired cognition     mention of mental retardation  . Goiter   . Moderate or severe vision impairment, both eyes, impairment level not further specified   . Mental retardation   . Hypoglycemia associated with diabetes   . Hypertension   . Thyroiditis, autoimmune   . Angiopathy, diabetic   . Diabetic peripheral neuropathy   . Dysmorphic features     Family History  Problem Relation Age of Onset  . Vision loss Father   . Diabetes Maternal Grandmother     AODM  . Diabetes Paternal Grandmother     AODM  . Diabetes Cousin     Second cousin has juvenile-onset DM.  Marland Kitchen Retinitis pigmentosa Mother     Current outpatient prescriptions:atorvastatin (LIPITOR) 10 MG tablet, Take 1 tablet (10 mg total) by mouth daily., Disp:  90 tablet, Rfl: 4;  Lancets (ACCU-CHEK MULTICLIX) lancets, USE AS DIRECTED TO TEST BLOOD GLUCOSE LEVELS, Disp: 204 each, Rfl: 0;  levothyroxine (SYNTHROID, LEVOTHROID) 25 MCG tablet, Take 1 tablet (25 mcg total) by mouth daily before breakfast., Disp: 90 tablet, Rfl: 4 lisinopril (PRINIVIL,ZESTRIL) 10 MG tablet, Take 10 mg by mouth daily., Disp: , Rfl: ;  NOVOLOG MIX 70/30 FLEXPEN (70-30) 100 UNIT/ML Pen, USE AS DIRECTED TAKE 32 UNITS IN THE AM AND 23 UNITS IN THE EVENING, Disp: 5 pen, Rfl: 6;  ONETOUCH DELICA LANCETS 33G MISC, 1 each by Does not apply route as needed., Disp: 200 each, Rfl: 3;  ONETOUCH VERIO test strip, CHECK BLOOD SUGAR 10 X DAILY, Disp: 300 each, Rfl: 6 Aspirin-Acetaminophen-Caffeine (GOODY HEADACHE PO), Take 1 packet by mouth daily as needed (for pain)., Disp: , Rfl: ;  ibuprofen (ADVIL,MOTRIN) 200 MG tablet, Take 400-800 mg by mouth every 6 (six) hours as needed for moderate pain.,  Disp: , Rfl:   Allergies as of 02/19/2014  . (No Known Allergies)    1. Work and Family:  Since being laid off from his job at the [employer], he has been unemployed. 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 He would like to go back to work. He is still living at home.   2. Activities: He does not get much physical activity. 3. Smoking, alcohol, or drugs: No tobacco or alcohol or drugs.  4. Primary Care Provider: None at present  REVIEW OF SYSTEMS: There are no other significant problems involving [place]on's other body systems.   Objective:  Vital Signs:  BP 136/94  Pulse 82  Wt 124 lb 12.8 oz (56.609 kg)   Ht Readings from Last 3 Encounters:  05/10/12 5\' 5"  (1.651 m)  04/30/11 5\' 5"  (1.651 m)   Wt Readings from Last 3 Encounters:  02/19/14 124 lb 12.8 oz (56.609 kg)  07/31/13 127 lb (57.607 kg)  03/29/13 120 lb 6.4 oz (54.613 kg)   There is no height on file to calculate BSA.  Normalized stature-for-age data available only for age 28 to 20 years. Normalized weight-for-age data available only for age 28 to 20 years.  PHYSICAL EXAM:  Constitutional: The patient appears fairly healthy and well nourished today.  He has lost 2.5 lbs since hs last visit. He looks rested. He sat passively through most of the visit, but did answer questions. His thought processes are very concrete. His insight remains poor. He really does not understand or comprehend very much about diabetes, about why he needs to take care of himself, and about what will happen to him if he does not take care of himself. His understanding about his diabetes and nutrition is very primitive. If left unsupervised for very long, he does not check his BGs reliably and does not take his insulin doses reliably.  Face: The face appears somewhat dysmorphic.  Eyes: There is no obvious arcus or proptosis. His eyes are fairly moist.   Mouth: The oropharynx appears normal. He has a geographic tongue. His mouth is fairly moist.  His oral hygiene is better today. Neck: The neck appears to be visibly normal. No carotid bruits are noted. The thyroid gland is a bit larger at 22-23 grams in size. The left lobe is more enlarged than at last visit. The right lobe is normal again today. The consistency of the thyroid gland is normal today. The thyroid gland is not tender to palpation. Lungs: The lungs are clear to auscultation. Air movement is good.  Heart: Heart rate and rhythm are regular. Heart sounds S1 and S2 are normal. I did not appreciate any pathologic cardiac murmurs. Abdomen: The abdomen is normal in size. Bowel sounds are normal. There is no obvious hepatomegaly, splenomegaly, or other mass effect.  Arms: Muscle size and bulk are normal for age.He has one area of excoriation on each forearm. The wounds are clean. Hands: There is no obvious tremor. Phalangeal and metacarpophalangeal joints are normal. Palmar muscles are normal. Palmar skin is normal. Palmar moisture is also normal. Hands are cool to the touch. Legs: Muscles appear normal for age. No edema is present. Feet: Feet are normally formed, but are cool to touch. Dorsalis pedal pulses are trace bilaterally. PT pulses are faint 1+ bilaterally. Neurologic: Strength is normal for age in both the upper and lower extremities. Muscle tone is normal. Sensation to touch is normal in both the legs and feet.    LAB DATA: Hemoglobin A1c today was 10.2% today, compared with 8.8% at last visit and with 8.5% at the visit prior.  Labs 07/31/13: CMP normal, except glucose 269; microalbumin/creatinine ratio 51.6; TSH 1.885, free T4 1.31, free T3 3.4;   Labs 7/018/14: CMP normal, except glucose of 14; cholesterol 202, triglycerides 85, HDL 58, LDL 127; urinary microalbumin/creatinine ratio 51.9; TSH 2.993, free T4 1.32, free T3 2.8  05/10/12: Creatinine 0.62, TSH 1.025,            Assessment and Plan:   ASSESSMENT:  1. Diabetes mellitus: The patient's recent hemoglobin A1c is  worse and correlates well with his BG printout. He is not having as many low BGs as he did at last visit. His loss in weight also indicates that he has been receiving less insulin. The family members need to ensure that [name] checks his BGs at breakfast, dinner, and bedtime. The family needs to ensure that [name] takes his Novolog 70/30 insulin at breakfast and at dinner. The family needs to ensure that if his BG at bedtime is < 200, he takes an appropriate snack.  2. Hypertension: Blood pressure is higher today. He is missing too many doses of lisinopril.  3. Peripheral neuropathy: This problem has improved and is not evident today, but will certainly recur if he does not get his BGs under better control.   4. Angiopathy: Patient is asymptomatic now. If he exercises on a regular basis and controls his BGs, blood flow in his legs and feet will improve. 5. Goiter: Thyroid gland is larger today. The waxing and waning of thyroid gland size is c/w evolving Hashimoto's disease.  6. Hypoglycemia: This does not occur often, but is more likely to occur if he fails to check BGs at bedtime and fails to take an appropriate snack.   7. Hypothyroid: Since he was out of Synthroid for awhile and has also missed doses, I can't predict how his thyroid hormone level will be now. He was euthyroid last March, which indicates that his Synthroid dose was good then. When he takes his Synthroid he also takes his lisinopril and atorvastatin. At other times, however, he does not take any pills.  8. Autonomic neuropathy,  Tachycardia, and gastroparesis: The tachycardia has improved, reflecting his lower A1c at last visit. His gastroparesis and bloating have worsened, reflecting his poor BG control in the past month. 9. Hypercholesterolemia: Cholesterol and LDL were worse in July 2014. My request to have the lipid panel repeated in December 2014 was not acted upon. He is under treatment now, if he  actually takes the medication. 10.  Weight loss, unintentional: This process has worsened due to taking less insulin.  11. Dehydration: He is re-hydrated today.  12. Medical neglect: His family is not supervising Thayer OhmChris' DM care as well as they did at last visit.  13. Geographic tongue: He needs to take a good MVI with B complex vitamins, such as One-A-Day Silver or Centrum Silver every day.  PLAN: 1. Diagnostic: HbA1c.  Annual surveillance labs prior to next visit.  2. Therapeutic: Family members must actively supervise his insulin doses and BG checks. Continue 70/30 insulin at 32 units each morning and 23 units at supper. If he does not eat much at breakfast or supper, reduce the insulin dose by 3 units. Try to fit in a walk every day of 30-60 minutes. Take atorvastatin, Synthroid, and lisinopril daily. Add a good MVI daily. 3. Patient education: Please cooperate with your mother, father, sister, aunt, and grandmother. I re-issued the family two copies of the Small Bedtime Snack Plan. 4. Follow-up: three  Months  Level of Service: This visit lasted in excess of 60 minutes. More than 50% of the visit was devoted to counseling.  David StallBRENNAN,MICHAEL J, MD 02/19/2014 10:31 AM

## 2014-04-12 ENCOUNTER — Other Ambulatory Visit: Payer: Self-pay | Admitting: *Deleted

## 2014-04-12 DIAGNOSIS — E1065 Type 1 diabetes mellitus with hyperglycemia: Secondary | ICD-10-CM

## 2014-04-12 DIAGNOSIS — IMO0002 Reserved for concepts with insufficient information to code with codable children: Secondary | ICD-10-CM

## 2014-04-22 ENCOUNTER — Other Ambulatory Visit: Payer: Self-pay | Admitting: *Deleted

## 2014-04-22 ENCOUNTER — Other Ambulatory Visit: Payer: Self-pay | Admitting: "Endocrinology

## 2014-04-22 DIAGNOSIS — E1065 Type 1 diabetes mellitus with hyperglycemia: Secondary | ICD-10-CM

## 2014-04-22 DIAGNOSIS — IMO0002 Reserved for concepts with insufficient information to code with codable children: Secondary | ICD-10-CM

## 2014-04-22 MED ORDER — GLUCOSE BLOOD VI STRP: ORAL_STRIP | Status: DC

## 2014-05-12 ENCOUNTER — Emergency Department (HOSPITAL_COMMUNITY)
Admission: EM | Admit: 2014-05-12 | Discharge: 2014-05-12 | Disposition: A | Discharge status: Home / Self Care | Payer: Medicare Other | Attending: Emergency Medicine | Admitting: Emergency Medicine

## 2014-05-12 ENCOUNTER — Encounter (HOSPITAL_COMMUNITY): Payer: Self-pay | Admitting: *Deleted

## 2014-05-12 DIAGNOSIS — E039 Hypothyroidism, unspecified: Secondary | ICD-10-CM | POA: Insufficient documentation

## 2014-05-12 DIAGNOSIS — E108 Type 1 diabetes mellitus with unspecified complications: Secondary | ICD-10-CM | POA: Diagnosis not present

## 2014-05-12 DIAGNOSIS — Z79899 Other long term (current) drug therapy: Secondary | ICD-10-CM | POA: Diagnosis not present

## 2014-05-12 DIAGNOSIS — E1065 Type 1 diabetes mellitus with hyperglycemia: Secondary | ICD-10-CM | POA: Diagnosis present

## 2014-05-12 DIAGNOSIS — Z794 Long term (current) use of insulin: Secondary | ICD-10-CM | POA: Diagnosis not present

## 2014-05-12 DIAGNOSIS — Z8669 Personal history of other diseases of the nervous system and sense organs: Secondary | ICD-10-CM | POA: Diagnosis not present

## 2014-05-12 DIAGNOSIS — R112 Nausea with vomiting, unspecified: Secondary | ICD-10-CM

## 2014-05-12 DIAGNOSIS — Q897 Multiple congenital malformations, not elsewhere classified: Secondary | ICD-10-CM | POA: Diagnosis not present

## 2014-05-12 DIAGNOSIS — I10 Essential (primary) hypertension: Secondary | ICD-10-CM | POA: Insufficient documentation

## 2014-05-12 DIAGNOSIS — Z8659 Personal history of other mental and behavioral disorders: Secondary | ICD-10-CM | POA: Insufficient documentation

## 2014-05-12 DIAGNOSIS — E1069 Type 1 diabetes mellitus with other specified complication: Secondary | ICD-10-CM | POA: Diagnosis not present

## 2014-05-12 LAB — CBC WITH DIFFERENTIAL/PLATELET
Basophils Absolute: 0 10*3/uL (ref 0.0–0.1)
Basophils Relative: 0 % (ref 0–1)
Eosinophils Absolute: 0 10*3/uL (ref 0.0–0.7)
Eosinophils Relative: 0 % (ref 0–5)
HCT: 45.6 % (ref 39.0–52.0)
Hemoglobin: 15.5 g/dL (ref 13.0–17.0)
LYMPHS ABS: 1 10*3/uL (ref 0.7–4.0)
Lymphocytes Relative: 8 % — ABNORMAL LOW (ref 12–46)
MCH: 31.1 pg (ref 26.0–34.0)
MCHC: 34 g/dL (ref 30.0–36.0)
MCV: 91.6 fL (ref 78.0–100.0)
Monocytes Absolute: 0.5 10*3/uL (ref 0.1–1.0)
Monocytes Relative: 4 % (ref 3–12)
NEUTROS PCT: 88 % — AB (ref 43–77)
Neutro Abs: 11.1 10*3/uL — ABNORMAL HIGH (ref 1.7–7.7)
PLATELETS: 235 10*3/uL (ref 150–400)
RBC: 4.98 MIL/uL (ref 4.22–5.81)
RDW: 13 % (ref 11.5–15.5)
WBC: 12.6 10*3/uL — AB (ref 4.0–10.5)

## 2014-05-12 LAB — COMPREHENSIVE METABOLIC PANEL
ALBUMIN: 4 g/dL (ref 3.5–5.2)
ALK PHOS: 78 U/L (ref 39–117)
ALT: 15 U/L (ref 0–53)
AST: 16 U/L (ref 0–37)
Anion gap: 12 (ref 5–15)
BUN: 16 mg/dL (ref 6–23)
CO2: 27 mEq/L (ref 19–32)
Calcium: 9.8 mg/dL (ref 8.4–10.5)
Chloride: 102 mEq/L (ref 96–112)
Creatinine, Ser: 0.78 mg/dL (ref 0.50–1.35)
GFR calc non Af Amer: >90 mL/min (ref >90)
GLUCOSE: 131 mg/dL — AB (ref 70–99)
POTASSIUM: 4 meq/L (ref 3.7–5.3)
SODIUM: 141 meq/L (ref 137–147)
TOTAL PROTEIN: 7.6 g/dL (ref 6.0–8.3)
Total Bilirubin: 0.4 mg/dL (ref 0.3–1.2)

## 2014-05-12 LAB — URINALYSIS, ROUTINE W REFLEX MICROSCOPIC
Glucose, UA: 500 mg/dL — AB
HGB URINE DIPSTICK: NEGATIVE
Ketones, ur: 15 mg/dL — AB
Leukocytes, UA: NEGATIVE
Nitrite: NEGATIVE
Protein, ur: 100 mg/dL — AB
SPECIFIC GRAVITY, URINE: 1.026 (ref 1.005–1.030)
Urobilinogen, UA: 1 mg/dL (ref 0.0–1.0)
pH: 7 (ref 5.0–8.0)

## 2014-05-12 LAB — URINE MICROSCOPIC-ADD ON

## 2014-05-12 LAB — CBG MONITORING, ED
GLUCOSE-CAPILLARY: 144 mg/dL — AB (ref 70–99)
GLUCOSE-CAPILLARY: 51 mg/dL — AB (ref 70–99)
Glucose-Capillary: 72 mg/dL (ref 70–99)

## 2014-05-12 MED ORDER — SODIUM CHLORIDE 0.9 % IV SOLN: 1000.0000 mL | INTRAVENOUS | Status: DC

## 2014-05-12 MED ORDER — SODIUM CHLORIDE 0.9 % IV SOLN
1000.0000 mL | Freq: Once | INTRAVENOUS | Status: AC
  Administered 2014-05-12: 1000 mL via INTRAVENOUS

## 2014-05-12 MED ORDER — ONDANSETRON HCL 4 MG/2ML IJ SOLN
4.0000 mg | Freq: Once | INTRAMUSCULAR | Status: AC
  Administered 2014-05-12: 4 mg via INTRAVENOUS
  Filled 2014-05-12: qty 2

## 2014-05-12 MED ORDER — PROMETHAZINE HCL 25 MG PO TABS: 25.0000 mg | ORAL_TABLET | Freq: Four times a day (QID) | ORAL | Status: DC | PRN

## 2014-05-12 NOTE — ED Notes (Signed)
Pt made aware we need a urine sample and was given a urinal.

## 2014-05-12 NOTE — Discharge Instructions (Signed)
Diabetes Mellitus and Food It is important for you to manage your blood sugar (glucose) level. Your blood glucose level can be greatly affected by what you eat. Eating healthier foods in the appropriate amounts throughout the day at about the same time each day will help you control your blood glucose level. It can also help slow or prevent worsening of your diabetes mellitus. Healthy eating may even help you improve the level of your blood pressure and reach or maintain a healthy weight.  HOW CAN FOOD AFFECT ME? Carbohydrates Carbohydrates affect your blood glucose level more than any other type of food. Your dietitian will help you determine how many carbohydrates to eat at each meal and teach you how to count carbohydrates. Counting carbohydrates is important to keep your blood glucose at a healthy level, especially if you are using insulin or taking certain medicines for diabetes mellitus. Alcohol Alcohol can cause sudden decreases in blood glucose (hypoglycemia), especially if you use insulin or take certain medicines for diabetes mellitus. Hypoglycemia can be a life-threatening condition. Symptoms of hypoglycemia (sleepiness, dizziness, and disorientation) are similar to symptoms of having too much alcohol.  If your health care provider has given you approval to drink alcohol, do so in moderation and use the following guidelines:  Women should not have more than one drink per day, and men should not have more than two drinks per day. One drink is equal to:  12 oz of beer.  5 oz of wine.  1 oz of hard liquor.  Do not drink on an empty stomach.  Keep yourself hydrated. Have water, diet soda, or unsweetened iced tea.  Regular soda, juice, and other mixers might contain a lot of carbohydrates and should be counted. WHAT FOODS ARE NOT RECOMMENDED? As you make food choices, it is important to remember that all foods are not the same. Some foods have fewer nutrients per serving than other  foods, even though they might have the same number of calories or carbohydrates. It is difficult to get your body what it needs when you eat foods with fewer nutrients. Examples of foods that you should avoid that are high in calories and carbohydrates but low in nutrients include:  Trans fats (most processed foods list trans fats on the Nutrition Facts label).  Regular soda.  Juice.  Candy.  Sweets, such as cake, pie, doughnuts, and cookies.  Fried foods. WHAT FOODS CAN I EAT? Have nutrient-rich foods, which will nourish your body and keep you healthy. The food you should eat also will depend on several factors, including:  The calories you need.  The medicines you take.  Your weight.  Your blood glucose level.  Your blood pressure level.  Your cholesterol level. You also should eat a variety of foods, including:  Protein, such as meat, poultry, fish, tofu, nuts, and seeds (lean animal proteins are best).  Fruits.  Vegetables.  Dairy products, such as milk, cheese, and yogurt (low fat is best).  Breads, grains, pasta, cereal, rice, and beans.  Fats such as olive oil, trans fat-free margarine, canola oil, avocado, and olives. DOES EVERYONE WITH DIABETES MELLITUS HAVE THE SAME MEAL PLAN? Because every person with diabetes mellitus is different, there is not one meal plan that works for everyone. It is very important that you meet with a dietitian who will help you create a meal plan that is just right for you. Document Released: 02/04/2005 Document Revised: 05/15/2013 Document Reviewed: 04/06/2013 Exit[employer] Patient Information 2015 [employer]. This   information is not intended to replace advice given to you by your health care provider. Make sure you discuss any questions you have with your health care provider.  

## 2014-05-12 NOTE — ED Notes (Signed)
Pt reports onset this am of n/v and cbg 252, +ketones in urine at home.

## 2014-05-12 NOTE — ED Provider Notes (Signed)
CSN: [id number]     Arrival date & time 05/12/14  1120 History   First MD Initiated Contact with Patient 05/12/14 1144     Chief Complaint  Patient presents with  . Emesis  . Hyperglycemia      HPI Patient went to a Christmas party last night came home.  Today he woke up with nausea and vomiting.  Patient has history of diabetes.  He did miss his insulin last night.  Patient denies fever or chills.  Denies diarrhea.  Denies abdominal pain. Past Medical History  Diagnosis Date  . Diabetes mellitus     Type 1, diagnosed age 28, with frequent DKA admissions, difficult to control due to MR  . Hypothyroidism   . Diabetic nephropathy     Started on Lisinopril 5 mg  . 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 Impaired cognition     mention of mental retardation  . Goiter   . Moderate or severe vision impairment, both eyes, impairment level not further specified   . Mental retardation   . Hypoglycemia associated with diabetes   . Hypertension   . Thyroiditis, autoimmune   . Angiopathy, diabetic   . Diabetic peripheral neuropathy   . Dysmorphic features    History reviewed. No pertinent past surgical history. Family History  Problem Relation Age of Onset  . Vision loss Father   . Diabetes Maternal Grandmother     AODM  . Diabetes Paternal Grandmother     AODM  . Diabetes Cousin     Second cousin has juvenile-onset DM.  Marland Kitchen. Retinitis pigmentosa Mother    History  Substance Use Topics  . Smoking status: Never Smoker   . Smokeless tobacco: Not on file  . Alcohol Use: No     Comment: Rarely consumes alcohol, perhaps once or twice per year.    Review of Systems  All other systems reviewed and are negative  Allergies  Review of patient's allergies indicates no known allergies.  Home Medications   Prior to Admission medications   Medication Sig Start Date End Date Taking? Authorizing Provider  atorvastatin (LIPITOR) 10 MG tablet Take 10 mg by mouth  daily.   Yes Historical Provider, MD  insulin aspart protamine- aspart (NOVOLOG MIX 70/30) (70-30) 100 UNIT/ML injection Inject 23-32 Units into the skin 2 (two) times daily with a meal. Inject 32 units in the AM and 23 units in the PM   Yes Historical Provider, MD  levothyroxine (SYNTHROID, LEVOTHROID) 25 MCG tablet Take 1 tablet (25 mcg total) by mouth daily before breakfast. 12/20/13  Yes David StallMichael J Brennan, MD  lisinopril (PRINIVIL,ZESTRIL) 10 MG tablet Take 10 mg by mouth daily.   Yes Historical Provider, MD  [facility] LANC[facility] 33G MISC Test BG 8 times daily. 02/19/14 02/20/15 Yes David StallMichael J Brennan, MD  atorvastatin (LIPITOR) 10 MG tablet TAKE 1 TABLET (10 MG TOTAL) BY MOUTH DAILY. 04/22/14   David StallMichael J Brennan, MD  lisinopril (PRINIVIL,ZESTRIL) 10 MG tablet TAKE 1 TABLET BY MOUTH ONCE A DAY 04/22/14   David StallMichael J Brennan, MD  NOVOLOG MIX 70/30 FLEXPEN (70-30) 100 UNIT/ML Pen USE AS DIRECTED TAKE 32 UNITS IN THE AM AND 23 UNITS IN THE EVENING    David StallMichael J Brennan, MD  promethazine (PHENERGAN) 25 MG tablet Take 1 tablet (25 mg total) by mouth every 6 (six) hours as needed for nausea or vomiting. 05/12/14   Nelia Shiobert L Jaina Morin, MD   BP 113/72 mmHg  Pulse 94  Temp(Src) 98 F (36.7 C) (Oral)  Resp 16  SpO2 100% Physical Exam Physical Exam  Nursing note and vitals reviewed. Constitutional: He is oriented to person, place, and time. He appears well-developed and well-nourished. No distress.  HENT:  Head: Normocephalic and atraumatic.  Eyes: Pupils are equal, round, and reactive to light.  Neck: Normal range of motion.  Cardiovascular: Normal rate and intact distal pulses.   Pulmonary/Chest: No respiratory distress.  Abdominal: Normal appearance. He exhibits no distension.  Musculoskeletal: Normal range of motion.  Neurological: He is alert and oriented to person, place, and time. No cranial nerve deficit.  Skin: Skin is warm and dry. No rash noted.  Psychiatric: He has a normal mood and  affect. His behavior is normal.   ED Course  Procedures (including critical care time) Medications  0.9 %  sodium chloride infusion (0 mLs Intravenous Stopped 05/12/14 1309)    Followed by  0.9 %  sodium chloride infusion (1,000 mLs Intravenous New Bag/Given 05/12/14 1309)    Followed by  0.9 %  sodium chloride infusion (not administered)  ondansetron (ZOFRAN) injection 4 mg (4 mg Intravenous Given 05/12/14 1156)    Labs Review Labs Reviewed  CBC WITH DIFFERENTIAL - Abnormal; Notable for the following:    WBC 12.6 (*)    Neutrophils Relative % 88 (*)    Neutro Abs 11.1 (*)    Lymphocytes Relative 8 (*)    All other components within normal limits  COMPREHENSIVE METABOLIC PANEL - Abnormal; Notable for the following:    Glucose, Bld 131 (*)    All other components within normal limits  URINALYSIS, ROUTINE W REFLEX MICROSCOPIC - Abnormal; Notable for the following:    APPearance CLOUDY (*)    Glucose, UA 500 (*)    Bilirubin Urine SMALL (*)    Ketones, ur 15 (*)    Protein, ur 100 (*)    All other components within normal limits  CBG MONITORING, ED - Abnormal; Notable for the following:    Glucose-Capillary 144 (*)    All other components within normal limits  URINE MICROSCOPIC-ADD ON  CBG MONITORING, ED    Imaging Review No results found.  After treatment in the ED the patient feels back to baseline and wants to go home.  MDM   Final diagnoses:  Non-intractable vomiting with nausea, vomiting of unspecified type  Type 1 diabetes mellitus with complications        Nelia Shiobert L [place], MD 05/12/14 1428

## 2014-05-12 NOTE — ED Notes (Signed)
cbg 144 at triage, pt vomiting and feels lightheaded

## 2014-05-27 ENCOUNTER — Ambulatory Visit: Payer: Medicare Other | Admitting: "Endocrinology

## 2014-06-15 ENCOUNTER — Other Ambulatory Visit: Payer: Self-pay | Admitting: "Endocrinology

## 2014-06-19 ENCOUNTER — Ambulatory Visit (INDEPENDENT_AMBULATORY_CARE_PROVIDER_SITE_OTHER): Payer: Medicare Other | Admitting: "Endocrinology

## 2014-06-19 ENCOUNTER — Encounter: Payer: Self-pay | Admitting: "Endocrinology

## 2014-06-19 VITALS — BP 133/92 | HR 94 | Wt 129.0 lb

## 2014-06-19 DIAGNOSIS — E10649 Type 1 diabetes mellitus with hypoglycemia without coma: Secondary | ICD-10-CM

## 2014-06-19 DIAGNOSIS — E1065 Type 1 diabetes mellitus with hyperglycemia: Secondary | ICD-10-CM | POA: Diagnosis not present

## 2014-06-19 DIAGNOSIS — E1043 Type 1 diabetes mellitus with diabetic autonomic (poly)neuropathy: Secondary | ICD-10-CM

## 2014-06-19 DIAGNOSIS — K3184 Gastroparesis: Secondary | ICD-10-CM

## 2014-06-19 DIAGNOSIS — G990 Autonomic neuropathy in diseases classified elsewhere: Secondary | ICD-10-CM

## 2014-06-19 DIAGNOSIS — E038 Other specified hypothyroidism: Secondary | ICD-10-CM | POA: Diagnosis not present

## 2014-06-19 DIAGNOSIS — E049 Nontoxic goiter, unspecified: Secondary | ICD-10-CM | POA: Diagnosis not present

## 2014-06-19 DIAGNOSIS — R000 Tachycardia, unspecified: Secondary | ICD-10-CM

## 2014-06-19 DIAGNOSIS — R634 Abnormal weight loss: Secondary | ICD-10-CM | POA: Diagnosis not present

## 2014-06-19 DIAGNOSIS — E1042 Type 1 diabetes mellitus with diabetic polyneuropathy: Secondary | ICD-10-CM

## 2014-06-19 DIAGNOSIS — I4711 Inappropriate sinus tachycardia, so stated: Secondary | ICD-10-CM

## 2014-06-19 DIAGNOSIS — E063 Autoimmune thyroiditis: Secondary | ICD-10-CM | POA: Diagnosis not present

## 2014-06-19 DIAGNOSIS — IMO0002 Reserved for concepts with insufficient information to code with codable children: Secondary | ICD-10-CM

## 2014-06-19 DIAGNOSIS — I471 Supraventricular tachycardia: Secondary | ICD-10-CM

## 2014-06-19 LAB — GLUCOSE, POCT (MANUAL RESULT ENTRY): POC Glucose: 425 mg/dl — AB (ref 70–99)

## 2014-06-19 LAB — POCT GLYCOSYLATED HEMOGLOBIN (HGB A1C): HEMOGLOBIN A1C: 10.7

## 2014-06-19 MED ORDER — GLUCOSE BLOOD VI STRP: ORAL_STRIP | Status: AC

## 2014-06-19 MED ORDER — ONETOUCH DELICA LANCETS 33G MISC: Status: AC

## 2014-06-19 NOTE — Progress Notes (Signed)
Subjective:  Patient Name: [name] Date of Birth: 17-Mar-1986  MRN: [id number]  [name]  presents to the office today for follow-up of his T1DM, recurrent DKA, hypoglycemia, mental retardation, hypertension, goiter, thyroiditis, autonomic neuropathy and tachycardia, peripheral angiopathy, peripheral neuropathy, visual impairment, non-compliance, and medical neglect.  HISTORY OF PRESENT ILLNESS:   [name] is a 29 y.o. Caucasian young man.  [name] was accompanied by his mother.   1. The patient was diagnosed with T1DM at age 70 in about 2001 or 2002. We have few details about his medical care until 2007. We know that in about 2006 he saw a pediatric endocrinologist at [facility], [facility], [facility]. Unfortunately, the family chose not to return to see the endocrinologist. In about April of 2007, his primary care provider, Dr. Reed Breech of [facility], started the patient on an insulin pump. Unfortunately, neither the patient nor the family were capable of utilizing the pump's technology effectively. On 10/02/2005 the patient was admitted to the [facility] for poorly controlled type 1 diabetes. I consulted on the patient at that time. 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 He did not have the insight or intelligence to perform the daily tasks of diabetes self-care independently or to utilize the insulin pump. It also appeared that there was not a great deal of adult supervision and support within his family. I changed his regimen to Novolog mix 70/30 insulin taken twice daily. He was to take 26 units each morning at breakfast and 14 units each evening at supper. I also gave him a sliding scale for Humalog lispro with different doses of insulin to be used at breakfast, lunch, supper, and bedtime in addition to his 70/30 insulin when his  family members were available to check the blood sugars and give the additional insulin. The patient's family members agreed to supervise his care.  2. Unfortunately, the patient has frequently not had the adult supervision and support that he needed. In the last 8 years, the patient has been scheduled to return to our clinic for follow up visits approximately every 3 months, but has actually only returned to our clinic for follow up visits 1-3 times per year. In 2012 he had 3 visits and was admitted once. In 2013 he had one admission in December, followed by a clinic visit later that month. In 2014 he had 4 clinic visits. In 2015 he had two clinic visits. In fairness, however, I should note that I was unavailable to see him for two months in 2015 due to my severe leg injury and post-operative recuperation.   3. His hemoglobin A1c values have been mostly in the 10.6-14.0% range. He has the following complications of diabetes: diabetic angiopathy, peripheral neuropathy, autonomic neuropathy, inappropriate sinus tachycardia, urinary microalbuminuria, and occasional but significant hypoglycemia. In November 2007, the patient was diagnosed with hypothyroidism secondary to Hashimoto's disease and was started on Synthroid at a dose of 25 mcg per day. In November 2008, he was diagnosed with hypertension and started on lisinopril, 5 mg per day. In July 2014 he was diagnosed with hypercholesterolemia and started on atorvastatin, 10 mg/day.  4. At the time of his last clinic visit on 02/19/14, the hemoglobin A1c was 10.2%.   A. In the interim, he has been healthy, except for an episode of nausea and vomiting in December. He was taken  to the ED, bu no cause was  found. . Mom says that he goes back and forth in terms of checking BGs and taking insulins, but most days he takes his insulin. Some days he does fairly well, but some days he does poorly. He also frequently skips his Synthroid, atorvastatin, and lisinopril. He  does have Medicaid coverage now. Providing adequate adult supervision for [name] is still a problem.  B.  He still sometimes scratches and excoriates his skin. He is still taking Novolog 70/30 insulin, 32 units in AM and 23 units in the PM, but misses doses at times. He often takes in very large amounts of carbs.  5. Pertinent Review of Systems:  Constitutional: The patient feels "good overall". He no longer feels tired and depressed.   Eyes: Vision is a little better now with his glasses. He is legally blind. He had his annual eye exam in February 2014. There were no signs of diabetic retinopathy.  Neck: The patient has no complaints of anterior neck swelling, soreness, tenderness,  pressure, discomfort, or difficulty swallowing.  Heart: The patient has no complaints of palpitations, irregular heat beats, chest pain, or chest pressure. Gastrointestinal: He no longer has bloating after meals. Bowel movents seem normal. The patient has no other complaints of excessive hunger, acid reflux, stomach aches or pains, diarrhea, or constipation. Legs: Muscle mass and strength seem normal. There are no complaints of numbness, tingling, burning, or pain. No edema is noted. Feet: There are no obvious foot problems. There are no recent complaints of numbness, tingling, burning, or pain. No edema is noted. GU: He has not been having nocturia very often.    Hypoglycemia: He has not had many low BGs.     6. BG meter review: He checks BGs from 0-3 times daily. There were no BG checks on 11 days out of the past 28 days.  Sometimes he goes for up to 6 days without BG checks. His average BG was about 360, range 96-High. Morning BGs range from 191-552. Lunch BGs range from 271-419. Dinner BGs range from [employer]. His two bedtime BGs were 117 and 383. It is obvious that he is missing many insulin doses.  PAST MEDICAL, FAMILY, AND SOCIAL HISTORY:  Past Medical History  Diagnosis Date  . Diabetes mellitus     Type 1,  diagnosed age 54, with frequent DKA admissions, difficult to control due to MR  . Hypothyroidism   . Diabetic nephropathy     Started on Lisinopril 5 mg  . 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 Impaired cognition     mention of mental retardation  . Goiter   . Moderate or severe vision impairment, both eyes, impairment level not further specified   . Mental retardation   . Hypoglycemia associated with diabetes   . Hypertension   . Thyroiditis, autoimmune   . Angiopathy, diabetic   . Diabetic peripheral neuropathy   . Dysmorphic features     Family History  Problem Relation Age of Onset  . Vision loss Father   . Diabetes Maternal Grandmother     AODM  . Diabetes Paternal Grandmother     AODM  . Diabetes Cousin     Second cousin has juvenile-onset DM.  Marland Kitchen Retinitis pigmentosa Mother      Current outpatient prescriptions:  .  atorvastatin (LIPITOR) 10 MG tablet, TAKE 1 TABLET (10 MG TOTAL) BY MOUTH DAILY., Disp: 90 tablet, Rfl: 2 .  levothyroxine (SYNTHROID, LEVOTHROID) 25 MCG tablet, Take 1  tablet (25 mcg total) by mouth daily before breakfast., Disp: 90 tablet, Rfl: 4 .  lisinopril (PRINIVIL,ZESTRIL) 10 MG tablet, Take 10 mg by mouth daily., Disp: , Rfl:  .  NOVOLOG MIX 70/30 FLEXPEN (70-30) 100 UNIT/ML Pen, USE AS DIRECTED TAKE 32 UNITS IN THE AM AND 23 UNITS IN THE EVENING, Disp: 5 pen, Rfl: 6 .  ONETOUCH DELICA LANCETS 33G MISC, Test BG 8 times daily., Disp: 240 each, Rfl: 6  Allergies as of 06/19/2014  . (No Known Allergies)    1. Work and Family:  Since being laid off from his job at the [employer], he has been unemployed. He occasionally volunteers at the [employer]. He would like to go back to work. He is still living at home.   2. Activities: He walks outside when the weather is better.  3. Smoking, alcohol, or drugs: No tobacco or alcohol or drugs.  4. Primary Care Provider: None at present  REVIEW OF SYSTEMS: There are no other  significant problems involving Zayn's other body systems.   Objective:  Vital Signs:  BP 133/92 mmHg  Pulse 94  Wt 129 lb (58.514 kg)   Ht Readings from Last 3 Encounters:  05/10/12  (1.651 m)  04/30/11  (1.651 m)   Wt Readings from Last 3 Encounters:  06/19/14 129 lb (58.514 kg)  02/19/14 124 lb 12.8 oz (56.609 kg)  07/31/13 127 lb (57.607 kg)   There is no height on file to calculate BSA.  Normalized stature-for-age data available only for age 17 to 20 years. Normalized weight-for-age data available only for age 17 to 20 years.  PHYSICAL EXAM:  Constitutional: The patient appears healthy and well nourished today. He has gained 4 pounds since hs last visit. He was fairly normally engaged and interactive today.  His thought processes are very concrete. His insight remains poor. He really does not understand or comprehend very much about diabetes, about why he needs to take care of himself, and about what will happen to him if he does not take care of himself. If left unsupervised for very long, he does not check his BGs reliably and does not take his insulin doses or oral medications reliably.  Face: The face appears somewhat dysmorphic.  Eyes: There is no obvious arcus or proptosis. His eyes are fairly moist.   Mouth: The oropharynx appears normal. His geographic tongue has resolved. His mouth is moist. His oral hygiene is better today. Neck: The neck appears to be visibly normal. No carotid bruits are noted. The thyroid gland is still a bit enlarged at 22-23 grams in size. The left lobe is more enlarged than the right. The right lobe is larger than it was at last visit.  The consistency of the thyroid gland is normal today. The thyroid gland is not tender to palpation. Lungs: The lungs are clear to auscultation. Air movement is good. Heart: Heart rate and rhythm are regular. Heart sounds S1 and S2 are normal. I did not appreciate any pathologic cardiac  murmurs. Abdomen: The abdomen is normal in size. Bowel sounds are normal. There is no obvious hepatomegaly, splenomegaly, or other mass effect.  Arms: Muscle size and bulk are normal for age. He has multiple areas of excoriation on each arm. The wounds are clean. Hands: There is no obvious tremor. Phalangeal and metacarpophalangeal joints are normal. Palmar muscles are normal. Palmar skin is normal. Palmar moisture is also normal. Legs: Muscles appear normal for age. No edema is  present. Feet: Feet are normally formed, but are cool to touch. Dorsalis pedal pulses are faint 1+ bilaterally.  Neurologic: Strength is normal for age in both the upper and lower extremities. Muscle tone is normal. Sensation to touch is normal in both the legs and feet.    LAB DATA: Hemoglobin A1c today was 10.7% today, compared with 10.2% at last visit and with 8.8% at the visit prior.  Labs 07/31/13: CMP normal, except glucose 269; microalbumin/creatinine ratio 51.6; TSH 1.885, free T4 1.31, free T3 3.4;   Labs 7/018/14: CMP normal, except glucose of 14; cholesterol 202, triglycerides 85, HDL 58, LDL 127; urinary microalbumin/creatinine ratio 51.9; TSH 2.993, free T4 1.32, free T3 2.8  05/10/12: Creatinine 0.62, TSH 1.025,            Assessment and Plan:   ASSESSMENT:  1. Diabetes mellitus: The patient's recent hemoglobin A1c is worse and correlates well with his BG printout. He is not having any documented low BGs. His gain in weight indicates that he is receiving more insulin than at last visit, but still not enough to meet his needs. The family members need to ensure that [name] checks his BGs at breakfast, dinner, and bedtime. The family needs to ensure that [name] takes his Novolog 70/30 insulin at breakfast and at dinner. The family needs to ensure that if his BG at bedtime is < 200, he takes an appropriate snack.  2. Hypertension: Blood pressure is higher today. He is missing too many doses of lisinopril.  3.  Peripheral neuropathy: This problem has improved and is not evident today, but will certainly recur if he does not get his BGs under better control.   4. Angiopathy: Patient is asymptomatic now. If he exercises on a regular basis and controls his BGs, blood flow in his legs and feet will improve. 5. Goiter: Thyroid gland is about the same size today. The waxing and waning of thyroid gland size is c/w evolving Hashimoto's disease.  6. Hypoglycemia: This problem has not occurred that we know this month.    7. Hypothyroid: Since he was out of Synthroid for awhile and has also missed doses, I can't predict how his thyroid hormone level will be now. He was euthyroid last March, which indicates that his Synthroid dose was good then. When he takes his Synthroid he also takes his lisinopril and atorvastatin. At other times, however, he does not take any pills.  8. Autonomic neuropathy, tachycardia, and gastroparesis: The tachycardia has worsened, paralleling his increase in HbA1c. His gastroparesis and bloating have improved, but will worsen again if his BGs continue to increase.  9. Hypercholesterolemia: Cholesterol and LDL were worse in July 2014. My request to have the lipid panel repeated in December 2014 was not acted upon. He is under treatment now, if he actually takes the medication. 10. Weight loss, unintentional: This process has improved.  11. Dehydration: He is re-hydrated today.  12. Medical neglect: His family is not supervising [name]' DM care as well as they did at last visit, but are probably doing as well as they can. Mom is trying to arrange a change in her job schedule so that she will be home at supper each evening. 13. 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   PLAN: 1. Diagnostic: HbA1c.  Annual surveillance labs prior to next visit.  2. Therapeutic: Family members must actively supervise his insulin doses and BG checks. Continue 70/30 insulin at 32 units each morning and 23 units at  supper.  If he does not eat much at breakfast or supper, reduce the insulin dose by 3 units. Try to fit in a walk every day of 30-60 minutes duration. Take atorvastatin, Synthroid, and lisinopril daily at whatever time will ensure better compliance. Continue the MVI daily. 3. Patient education: We discussed all of the above. Please cooperate with your mother, father, sister, aunt, and grandmother.  4. Follow-up: three  Months  Level of Service: This visit lasted in excess of 50 minutes. More than 50% of the visit was devoted to counseling.  David StallBRENNAN,Kalven Ganim J, MD 06/19/2014 10:40 AM

## 2014-06-19 NOTE — Patient Instructions (Signed)
Follow up visit in 3 months. Please have lab tests done one week prior to next visit.  

## 2014-07-31 ENCOUNTER — Telehealth: Payer: Self-pay | Admitting: "Endocrinology

## 2014-07-31 NOTE — Telephone Encounter (Signed)
Routed to provider

## 2014-08-07 DIAGNOSIS — H3552 Pigmentary retinal dystrophy: Secondary | ICD-10-CM | POA: Diagnosis not present

## 2014-08-07 DIAGNOSIS — E10321 Type 1 diabetes mellitus with mild nonproliferative diabetic retinopathy with macular edema: Secondary | ICD-10-CM | POA: Diagnosis not present

## 2014-08-14 ENCOUNTER — Encounter (INDEPENDENT_AMBULATORY_CARE_PROVIDER_SITE_OTHER): Payer: Medicare Other | Admitting: Ophthalmology

## 2014-08-14 DIAGNOSIS — H35033 Hypertensive retinopathy, bilateral: Secondary | ICD-10-CM

## 2014-08-14 DIAGNOSIS — H3552 Pigmentary retinal dystrophy: Secondary | ICD-10-CM

## 2014-08-14 DIAGNOSIS — H43813 Vitreous degeneration, bilateral: Secondary | ICD-10-CM

## 2014-08-14 DIAGNOSIS — I10 Essential (primary) hypertension: Secondary | ICD-10-CM

## 2014-08-14 NOTE — Telephone Encounter (Signed)
Papers filled out and picked up by mom. Rufina FalcoEmily M Hull

## 2014-08-26 ENCOUNTER — Telehealth: Payer: Self-pay | Admitting: *Deleted

## 2014-08-26 NOTE — Telephone Encounter (Signed)
LVM to call and reschedule 4/28 appt.

## 2014-09-09 ENCOUNTER — Ambulatory Visit (INDEPENDENT_AMBULATORY_CARE_PROVIDER_SITE_OTHER): Payer: Medicare Other | Admitting: "Endocrinology

## 2014-09-09 ENCOUNTER — Encounter: Payer: Self-pay | Admitting: "Endocrinology

## 2014-09-09 VITALS — BP 135/96 | HR 91 | Wt 129.4 lb

## 2014-09-09 DIAGNOSIS — E10649 Type 1 diabetes mellitus with hypoglycemia without coma: Secondary | ICD-10-CM

## 2014-09-09 DIAGNOSIS — E1043 Type 1 diabetes mellitus with diabetic autonomic (poly)neuropathy: Secondary | ICD-10-CM

## 2014-09-09 DIAGNOSIS — IMO0002 Reserved for concepts with insufficient information to code with codable children: Secondary | ICD-10-CM

## 2014-09-09 DIAGNOSIS — E038 Other specified hypothyroidism: Secondary | ICD-10-CM | POA: Diagnosis not present

## 2014-09-09 DIAGNOSIS — T7401XD Adult neglect or abandonment, confirmed, subsequent encounter: Secondary | ICD-10-CM

## 2014-09-09 DIAGNOSIS — G990 Autonomic neuropathy in diseases classified elsewhere: Secondary | ICD-10-CM

## 2014-09-09 DIAGNOSIS — E1143 Type 2 diabetes mellitus with diabetic autonomic (poly)neuropathy: Secondary | ICD-10-CM

## 2014-09-09 DIAGNOSIS — E1065 Type 1 diabetes mellitus with hyperglycemia: Secondary | ICD-10-CM | POA: Diagnosis not present

## 2014-09-09 DIAGNOSIS — K3184 Gastroparesis: Secondary | ICD-10-CM

## 2014-09-09 DIAGNOSIS — I471 Supraventricular tachycardia: Secondary | ICD-10-CM

## 2014-09-09 DIAGNOSIS — E063 Autoimmune thyroiditis: Secondary | ICD-10-CM

## 2014-09-09 DIAGNOSIS — E1042 Type 1 diabetes mellitus with diabetic polyneuropathy: Secondary | ICD-10-CM

## 2014-09-09 DIAGNOSIS — E109 Type 1 diabetes mellitus without complications: Secondary | ICD-10-CM | POA: Diagnosis not present

## 2014-09-09 DIAGNOSIS — R000 Tachycardia, unspecified: Secondary | ICD-10-CM

## 2014-09-09 DIAGNOSIS — E049 Nontoxic goiter, unspecified: Secondary | ICD-10-CM

## 2014-09-09 LAB — GLUCOSE, POCT (MANUAL RESULT ENTRY): POC Glucose: 313 mg/dl — AB (ref 70–99)

## 2014-09-09 LAB — POCT GLYCOSYLATED HEMOGLOBIN (HGB A1C): Hemoglobin A1C: 11.3

## 2014-09-09 NOTE — Progress Notes (Signed)
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  HISTORY OF PRESENT ILLNESS:   [name] is a 29 y.o. Caucasian young man.  [name] was accompanied by his mother, sister, [name],and [name]'s little boy. .    1. The patient was diagnosed with T1DM at age 58 in about 2001 or 2002. We have few details about his medical care until 2007. We know that in about 2006 he saw a pediatric endocrinologist at [facility], [facility], [facility]. Unfortunately, the family chose not to return to see the endocrinologist. In about April of 2007, his primary care provider, Dr. Reed Breech of [facility], started the patient on an insulin pump. Unfortunately, neither the patient nor the family were capable of utilizing the pump's technology effectively. On 10/02/2005 the patient was admitted to the [facility] for poorly controlled type 1 diabetes. I consulted on the patient at that time. 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 He did not have the insight or intelligence to perform the daily tasks of diabetes self-care independently or to utilize the insulin pump. It also appeared that there was not a great deal of adult supervision and support within his family. I changed his regimen to Novolog Mix 70/30 insulin taken twice daily. He was to take 26 units each morning at breakfast and 14 units each evening at supper. I also gave him a sliding scale for Humalog lispro with different doses of insulin to be used at  breakfast, lunch, supper, and bedtime in addition to his 70/30 insulin when his family members were available to check the blood sugars and give the additional insulin. The patient's family members agreed to supervise his care.  2. Unfortunately, the patient has frequently not had the adult supervision and support that he needed. In the last 8 years, the patient has been scheduled to return to our clinic for follow up visits approximately every 3 months, but has actually only returned to our clinic for follow up visits 1-3 times per year. In 2012 he had 3 visits and was admitted once. In 2013 he had one admission in December, followed by a clinic visit later that month. In 2014 he had 4 clinic visits. In 2015 he had two clinic visits. In fairness, however, I should note that I was unavailable to see him for two months in 2015 due to my severe leg injury and post-operative recuperation.   3. His hemoglobin A1c values have been mostly in the 10.6-14.0% range. He has the following complications of diabetes: diabetic angiopathy, peripheral neuropathy, autonomic neuropathy, inappropriate sinus tachycardia, urinary microalbuminuria, and occasional but significant hypoglycemia. In November 2007, the patient was diagnosed with hypothyroidism secondary to Hashimoto's disease and was started on Synthroid at a dose of 25 mcg per day. In November 2008, he was diagnosed with hypertension and started on lisinopril, 5 mg per day. In July 2014 he was diagnosed with hypercholesterolemia and started on atorvastatin, 10 mg/day.  4. At the time of his last clinic visit on 06/19/14, the hemoglobin A1c was 10.7%.   A. In the interim, he has been healthy. He has not had any further ED visits.  Mom  says that he goes back and forth in terms of checking BGs and taking insulins, but most days he takes his insulin. Some days he does fairly well, but some days he does poorly. He also frequently skips his Synthroid, atorvastatin, and  lisinopril. He does have Medicaid coverage now. Providing adequate adult supervision for [name] is still a problem. Mom is trying to switch to a daytime job now.   B.  He has not been scratching and excoriating his skin very often. He is still supposed to take Novolog 70/30 insulin, 32 units in AM and 23 units in the PM, but misses doses at times. He often consumes very large amounts of carbs.  5. Pertinent Review of Systems:  Constitutional: The patient feels "good overall". He no longer feels tired, but does get  depressed sometimes.   Eyes: Vision is a little better now with his glasses. He is legally blind. He had his annual eye exam within the past two months. He also saw a retinologist who saw no evidence of diabetic eye disease, but noted that his retinitis pigmentosa is "getting a little worse".  Neck: The patient has no complaints of anterior neck swelling, soreness, tenderness,  pressure, discomfort, or difficulty swallowing.  Heart: The patient has no complaints of palpitations, irregular heat beats, chest pain, or chest pressure. Gastrointestinal: He no longer has bloating after meals. Bowel movents seem normal. The patient has no other complaints of excessive hunger, acid reflux, stomach aches or pains, diarrhea, or constipation. Legs: Muscle mass and strength seem normal. There are no complaints of numbness, tingling, burning, or pain. No edema is noted. Feet: There are no obvious foot problems. There are no recent complaints of numbness, tingling, burning, or pain. No edema is noted. GU: He has not been having nocturia very often.    Hypoglycemia: He has had a few episodes of symptomatic low BG.    6. BG meter review: He checks BGs from 0-2 times daily, compared with 0-3 times daily at his last visit. There were no BG checks on 17 days out of the past 28 days, compared with 11 days out of 28 days at his last visit. Sometimes he goes for up to 5 days without BG checks, compared with 6 days  at his last visit. His average BG was 312 in the past month, compared with about 360 at his last visit. He has had two BGs < 80, one 56 and one 60. Highest BG was 499. Morning BGs range from 76-436. Lunch BGs range from 250-533. Dinner BGs range from 867-620-5130. His one bedtime BG was 342. It is obvious that he is missing many insulin doses.  PAST MEDICAL, FAMILY, AND SOCIAL HISTORY:  Past Medical History  Diagnosis Date  . Diabetes mellitus     Type 1, diagnosed age 43, with frequent DKA admissions, difficult to control due to MR  . Hypothyroidism   . Diabetic nephropathy     Started on Lisinopril 5 mg  . 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 Impaired cognition     mention of mental retardation  . Goiter   . Moderate or severe vision impairment, both eyes, impairment level not further specified   . Mental retardation   . Hypoglycemia associated with diabetes   . Hypertension   . Thyroiditis, autoimmune   . Angiopathy, diabetic   . Diabetic peripheral neuropathy   . Dysmorphic features     Family History  Problem  Relation Age of Onset  . Vision loss Father   . Diabetes Maternal Grandmother     AODM  . Diabetes Paternal Grandmother     AODM  . Diabetes Cousin     Second cousin has juvenile-onset DM.  Marland Kitchen. Retinitis pigmentosa Mother      Current outpatient prescriptions:  .  atorvastatin (LIPITOR) 10 MG tablet, TAKE 1 TABLET (10 MG TOTAL) BY MOUTH DAILY., Disp: 90 tablet, Rfl: 2 .  glucose blood (ONETOUCH VERIO) test strip, Check blood sugar 10 x daily, Disp: 300 each, Rfl: 6 .  levothyroxine (SYNTHROID, LEVOTHROID) 25 MCG tablet, Take 1 tablet (25 mcg total) by mouth daily before breakfast., Disp: 90 tablet, Rfl: 4 .  NOVOLOG MIX 70/30 FLEXPEN (70-30) 100 UNIT/ML Pen, USE AS DIRECTED TAKE 32 UNITS IN THE AM AND 23 UNITS IN THE EVENING, Disp: 5 pen, Rfl: 6 .  ONETOUCH DELICA LANCETS 33G MISC, Test BG 8 times daily., Disp: 240 each, Rfl: 6 .  lisinopril  (PRINIVIL,ZESTRIL) 10 MG tablet, Take 10 mg by mouth daily., Disp: , Rfl:   Allergies as of 09/09/2014  . (No Known Allergies)    1. Work and Family:  Since being laid off from his job at the [employer], he has been unemployed. He occasionally volunteers at the [employer]. He would like to go back to work. He is still living at home.   2. Activities: He walks outside when the weather is better.  3. Smoking, alcohol, or drugs: No tobacco or alcohol or drugs.  4. Primary Care Provider: None at present  REVIEW OF SYSTEMS: There are no other significant problems involving [name]'s other body systems.   Objective:  Vital Signs:  BP 135/96 mmHg  Pulse 91  Wt 129 lb 6.4 oz (58.695 kg)   Ht Readings from Last 3 Encounters:  05/10/12 5\' 5"  (1.651 m)  04/30/11 5\' 5"  (1.651 m)   Wt Readings from Last 3 Encounters:  09/09/14 129 lb 6.4 oz (58.695 kg)  06/19/14 129 lb (58.514 kg)  02/19/14 124 lb 12.8 oz (56.609 kg)   There is no height on file to calculate BSA.  Normalized stature-for-age data available only for age 6 to 20 years. Normalized weight-for-age data available only for age 6 to 20 years.  PHYSICAL EXAM:  Constitutional: The patient appears healthy and well nourished today. His weight is unchanged since his last visit. He was fairly normally engaged and interactive today.  His thought processes are very concrete. His insight remains poor. He really does not understand or comprehend very much about diabetes, about why he needs to take care of himself, and about what will happen to him if he does not take care of himself. If left unsupervised for very long, he does not check his BGs reliably and does not take his insulin doses or oral medications reliably.  Face: The face appears somewhat dysmorphic.  Eyes: There is no obvious arcus or proptosis. His eyes are fairly moist.   Mouth: The oropharynx appears normal. His geographic tongue has resolved. His mouth is moist.  His oral hygiene is better today. Neck: The neck appears to be visibly normal. No carotid bruits are noted. The thyroid gland is still a bit enlarged, but smaller, at 21-22 grams in size. Both lobes are equally enlarged. The consistency of the thyroid gland is fairly firm today. The thyroid gland is not tender to palpation. Lungs: The lungs are clear to auscultation. Air movement is good. Heart: Heart rate  and rhythm are regular. Heart sounds S1 and S2 are normal. I did not appreciate any pathologic cardiac murmurs. Abdomen: The abdomen is normal in size. Bowel sounds are normal. There is no obvious hepatomegaly, splenomegaly, or other mass effect.  Arms: Muscle size and bulk are normal for age. He has no new areas of excoriation on is arms. The old excoriations are healing nicely. Hands: There is no obvious tremor. Phalangeal and metacarpophalangeal joints are normal. Palmar muscles are normal. Palmar skin is normal. Palmar moisture is also normal. Legs: Muscles appear normal for age. No edema is present. Feet: Feet are normally formed, but are cool to touch. Dorsalis pedal pulses are faint 1+ bilaterally. Right PT pulse is 1+. Left PT pulse is faint 1+.  Neurologic: Strength is normal for age in both the upper and lower extremities. Muscle tone is normal. Sensation to touch is normal in both the legs and feet.   Skin: He does not have any open excoriations today.   LAB DATA: Hemoglobin A1c today was 11.3% today, compared with 10.7% at last visit and with 10.2% at the visit prior.  Labs 09/09/14: Pending  Labs 05/12/14: CMP normal,   Labs 07/31/13: CMP normal, except glucose 269; microalbumin/creatinine ratio 51.6; TSH 1.885, free T4 1.31, free T3 3.4;   Labs 7/018/14: CMP normal, except glucose of 14; cholesterol 202, triglycerides 85, HDL 58, LDL 127; urinary microalbumin/creatinine ratio 51.9; TSH 2.993, free T4 1.32, free T3 2.8  05/10/12: Creatinine 0.62, TSH 1.025,            Assessment  and Plan:   ASSESSMENT:  1. Diabetes mellitus: The patient's recent hemoglobin A1c is even worse and correlates well with his BG printout. He is not having any documented low BGs. His stabilization in weight indicates that he is receiving enough insulin to prevent weight loss, but still not enough to control his BGs. Despite encouraging the family members to more tightly supervise their supervision of [name]' DM care and his taking of his medications, things have gotten worse, not better. I have told mom and [name] that I have no choice but to report [name]' case to DSS.  2. Hypertension: Blood pressure is too high today. He is missing too many doses of lisinopril.  3. Peripheral neuropathy: This problem has improved and is not evident today, but will certainly recur if he does not get his BGs under better control.   4. Angiopathy: Patient is asymptomatic now. If he exercises on a regular basis and controls his BGs, blood flow in his legs and feet will improve. 5. Goiter: Thyroid gland is a bit smaller. The waxing and waning of thyroid gland size is c/w evolving Hashimoto's disease.  6. Hypoglycemia: He has had some symptoms of low BG, but there has not been any documented hypoglycemia.   7. Hypothyroid: We have no idea about how compliant or non-compliant he is with taking Synthroid, but his TSH probably parallels his BP. We will obtain TFTs today.  8. Autonomic neuropathy, tachycardia, and gastroparesis: The tachycardia continues. His gastroparesis and bloating have improved, but will worsen again if his BGs continue to increase.  9. Hypercholesterolemia: Cholesterol and LDL were worse in July 2014. My request to have the lipid panel repeated in December 2014 was not acted upon. He is under treatment now, if he actually takes the medication. We'll draw labs today. 10. Weight loss, unintentional: This process has improved.  11. Dehydration: He is re-hydrated today.  12. Medical neglect: His family  is  not supervising [name]' DM care and medical care as well as they did at last visit, but are probably doing as well as they can. Mom is still trying to arrange a change in her job schedule so that she will be home at supper each evening, but thus far no change has occurred. We must report [name]' case to DSS.  13. 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   PLAN: 1. Diagnostic: HbA1c.  Annual surveillance labs today.   2. Therapeutic: Family members must actively supervise his insulin doses and BG checks. Continue 70/30 insulin at 32 units each morning and 23 units at supper. If he does not eat much at breakfast or supper, reduce the insulin dose by 3 units. Try to fit in a walk every day of 30-60 minutes duration. Take atorvastatin, Synthroid, and lisinopril daily at whatever time will ensure better compliance. Continue the MVI daily. 3. Patient education: We discussed all of the above. Please cooperate with your mother, father, sister, aunt, and grandmother.  4. Follow-up: three months 5. Contact DSS to open a case.   Level of Service: This visit lasted in excess of 50 minutes. More than 50% of the visit was devoted to counseling.  David Stall, MD 09/09/2014 10:42 AM

## 2014-09-09 NOTE — Patient Instructions (Signed)
Follow up visit in 3 months. 

## 2014-09-10 LAB — LIPID PANEL
Cholesterol: 174 mg/dL (ref 0–200)
HDL: 46 mg/dL (ref >=40)
LDL Cholesterol: 92 mg/dL (ref 0–99)
Total CHOL/HDL Ratio: 3.8 Ratio
Triglycerides: 181 mg/dL — ABNORMAL HIGH (ref <150)
VLDL: 36 mg/dL (ref 0–40)

## 2014-09-10 LAB — COMPREHENSIVE METABOLIC PANEL
ALK PHOS: 79 U/L (ref 39–117)
ALT: 14 U/L (ref 0–53)
AST: 16 U/L (ref 0–37)
Albumin: 3.8 g/dL (ref 3.5–5.2)
BUN: 13 mg/dL (ref 6–23)
CALCIUM: 9.3 mg/dL (ref 8.4–10.5)
CO2: 31 mEq/L (ref 19–32)
CREATININE: 0.81 mg/dL (ref 0.50–1.35)
Chloride: 102 mEq/L (ref 96–112)
Glucose, Bld: 273 mg/dL — ABNORMAL HIGH (ref 70–99)
Potassium: 4.3 mEq/L (ref 3.5–5.3)
Sodium: 138 mEq/L (ref 135–145)
TOTAL PROTEIN: 6.6 g/dL (ref 6.0–8.3)
Total Bilirubin: 0.3 mg/dL (ref 0.2–1.2)

## 2014-09-10 LAB — HEMOGLOBIN A1C
Hgb A1c MFr Bld: 11.9 % — ABNORMAL HIGH (ref <5.7)
Mean Plasma Glucose: 295 mg/dL — ABNORMAL HIGH (ref <117)

## 2014-09-10 LAB — MICROALBUMIN / CREATININE URINE RATIO
CREATININE, URINE: 58.9 mg/dL
MICROALB/CREAT RATIO: 139.2 mg/g — AB (ref 0.0–30.0)
Microalb, Ur: 8.2 mg/dL — ABNORMAL HIGH (ref <2.0)

## 2014-09-10 LAB — T3, FREE: T3, Free: 2.6 pg/mL (ref 2.3–4.2)

## 2014-09-10 LAB — T4, FREE: Free T4: 1.02 ng/dL (ref 0.80–1.80)

## 2014-09-10 LAB — TSH: TSH: 1.822 u[IU]/mL (ref 0.350–4.500)

## 2014-09-15 ENCOUNTER — Other Ambulatory Visit: Payer: Self-pay | Admitting: "Endocrinology

## 2014-09-19 ENCOUNTER — Ambulatory Visit: Payer: Medicare Other | Admitting: "Endocrinology

## 2014-09-25 ENCOUNTER — Encounter: Payer: Self-pay | Admitting: *Deleted

## 2014-10-07 DIAGNOSIS — H35033 Hypertensive retinopathy, bilateral: Secondary | ICD-10-CM | POA: Diagnosis not present

## 2014-10-07 DIAGNOSIS — H3552 Pigmentary retinal dystrophy: Secondary | ICD-10-CM | POA: Diagnosis not present

## 2014-10-07 DIAGNOSIS — E109 Type 1 diabetes mellitus without complications: Secondary | ICD-10-CM | POA: Diagnosis not present

## 2014-11-05 ENCOUNTER — Encounter (HOSPITAL_COMMUNITY): Payer: Self-pay | Admitting: *Deleted

## 2014-11-05 ENCOUNTER — Emergency Department (HOSPITAL_COMMUNITY)
Admission: EM | Admit: 2014-11-05 | Discharge: 2014-11-05 | Disposition: A | Discharge status: Home / Self Care | Payer: Medicare Other | Attending: Emergency Medicine | Admitting: Emergency Medicine

## 2014-11-05 DIAGNOSIS — Q897 Multiple congenital malformations, not elsewhere classified: Secondary | ICD-10-CM | POA: Diagnosis not present

## 2014-11-05 DIAGNOSIS — Z8659 Personal history of other mental and behavioral disorders: Secondary | ICD-10-CM | POA: Insufficient documentation

## 2014-11-05 DIAGNOSIS — I10 Essential (primary) hypertension: Secondary | ICD-10-CM | POA: Insufficient documentation

## 2014-11-05 DIAGNOSIS — Z8669 Personal history of other diseases of the nervous system and sense organs: Secondary | ICD-10-CM | POA: Diagnosis not present

## 2014-11-05 DIAGNOSIS — E1142 Type 2 diabetes mellitus with diabetic polyneuropathy: Secondary | ICD-10-CM | POA: Diagnosis not present

## 2014-11-05 DIAGNOSIS — L02416 Cutaneous abscess of left lower limb: Secondary | ICD-10-CM | POA: Insufficient documentation

## 2014-11-05 DIAGNOSIS — Z79899 Other long term (current) drug therapy: Secondary | ICD-10-CM | POA: Diagnosis not present

## 2014-11-05 DIAGNOSIS — E039 Hypothyroidism, unspecified: Secondary | ICD-10-CM | POA: Insufficient documentation

## 2014-11-05 DIAGNOSIS — L0291 Cutaneous abscess, unspecified: Secondary | ICD-10-CM

## 2014-11-05 LAB — CBC WITH DIFFERENTIAL/PLATELET
Basophils Absolute: 0 10*3/uL (ref 0.0–0.1)
Basophils Relative: 0 % (ref 0–1)
Eosinophils Absolute: 0.2 10*3/uL (ref 0.0–0.7)
Eosinophils Relative: 3 % (ref 0–5)
HCT: 40.4 % (ref 39.0–52.0)
Hemoglobin: 13.4 g/dL (ref 13.0–17.0)
Lymphocytes Relative: 37 % (ref 12–46)
Lymphs Abs: 2.1 10*3/uL (ref 0.7–4.0)
MCH: 30.7 pg (ref 26.0–34.0)
MCHC: 33.2 g/dL (ref 30.0–36.0)
MCV: 92.7 fL (ref 78.0–100.0)
Monocytes Absolute: 0.5 10*3/uL (ref 0.1–1.0)
Monocytes Relative: 8 % (ref 3–12)
Neutro Abs: 3 10*3/uL (ref 1.7–7.7)
Neutrophils Relative %: 52 % (ref 43–77)
Platelets: 231 10*3/uL (ref 150–400)
RBC: 4.36 MIL/uL (ref 4.22–5.81)
RDW: 12.8 % (ref 11.5–15.5)
WBC: 5.7 10*3/uL (ref 4.0–10.5)

## 2014-11-05 LAB — CBG MONITORING, ED: Glucose-Capillary: 121 mg/dL — ABNORMAL HIGH (ref 65–99)

## 2014-11-05 MED ORDER — OXYCODONE-ACETAMINOPHEN 5-325 MG PO TABS
1.0000 | ORAL_TABLET | Freq: Once | ORAL | Status: AC
  Administered 2014-11-05: 1 via ORAL
  Filled 2014-11-05: qty 1

## 2014-11-05 MED ORDER — SULFAMETHOXAZOLE-TRIMETHOPRIM 800-160 MG PO TABS
1.0000 | ORAL_TABLET | Freq: Once | ORAL | Status: AC
  Administered 2014-11-05: 1 via ORAL
  Filled 2014-11-05: qty 1

## 2014-11-05 MED ORDER — SULFAMETHOXAZOLE-TRIMETHOPRIM 800-160 MG PO TABS: 1.0000 | ORAL_TABLET | Freq: Two times a day (BID) | ORAL | Status: AC

## 2014-11-05 MED ORDER — OXYCODONE-ACETAMINOPHEN 5-325 MG PO TABS: 2.0000 | ORAL_TABLET | ORAL | Status: DC | PRN

## 2014-11-05 MED ORDER — LIDOCAINE-EPINEPHRINE (PF) 2 %-1:200000 IJ SOLN
20.0000 mL | Freq: Once | INTRAMUSCULAR | Status: AC
  Administered 2014-11-05: 20 mL
  Filled 2014-11-05: qty 20

## 2014-11-05 NOTE — ED Notes (Signed)
Pt to ED with abscess to L lower leg. Mother has been attempting to keep area clean and using antibiotic ointment. Area to lower leg swollen, red and draining small amount of fluid.

## 2014-11-05 NOTE — ED Provider Notes (Signed)
CSN: [id number]     Arrival date & time 11/05/14  0609 History   First MD Initiated Contact with Patient 11/05/14 0645     Chief Complaint  Patient presents with  . Abscess   HPI   28 YOM presents with abscess to left calf. Pt reports that he recently had bedbugs causing multiple sores on his body, he reports a week ago that he has developed redness to the calf, itchiness, with a small amount of purulent drainage. She reports surrounding redness continued to today. He reports some minor pain, denies fevers, chills, nausea, vomiting, abdominal pain, any other infected bites. Patient reports he is a diabetic, not currently well-controlled as he forgets to take his medicine as directed. She has no additional complaints in addition to those noted. Patient does not smoke, no history of skin infections requiring antibiotic. Patient has been eating and drinking normally, feeling well. Reports that they've tried hydrogen peroxide, and topical triple antibiotic ointment.   Past Medical History  Diagnosis Date  . Diabetes mellitus     Type 1, diagnosed age 14, with frequent DKA admissions, difficult to control due to MR  . Hypothyroidism   . Diabetic nephropathy     Started on Lisinopril 5 mg  . 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 Impaired cognition     mention of mental retardation  . Goiter   . Moderate or severe vision impairment, both eyes, impairment level not further specified   . Mental retardation   . Hypoglycemia associated with diabetes   . Hypertension   . Thyroiditis, autoimmune   . Angiopathy, diabetic   . Diabetic peripheral neuropathy   . Dysmorphic features    Past Surgical History  Procedure Laterality Date  . Dental surgery     Family History  Problem Relation Age of Onset  . Vision loss Father   . Diabetes Maternal Grandmother     AODM  . Diabetes Paternal Grandmother     AODM  . Diabetes Cousin     Second cousin has juvenile-onset DM.  Marland Kitchen  Retinitis pigmentosa Mother    History  Substance Use Topics  . Smoking status: Never Smoker   . Smokeless tobacco: Not on file  . Alcohol Use: No     Comment: Rarely consumes alcohol, perhaps once or twice per year.    Review of Systems  All other systems reviewed and are negative.   Allergies  Review of patient's allergies indicates no known allergies.  Home Medications   Prior to Admission medications   Medication Sig Start Date End Date Taking? Authorizing Provider  atorvastatin (LIPITOR) 10 MG tablet TAKE 1 TABLET (10 MG TOTAL) BY MOUTH DAILY. 04/22/14  Yes David Stall, MD  glucose blood [facility] VERIO) test strip Check blood sugar 10 x daily 06/19/14 06/20/15 Yes David Stall, MD  ibuprofen (ADVIL,MOTRIN) 200 MG tablet Take 400-600 mg by mouth every 6 (six) hours as needed for moderate pain.   Yes Historical Provider, MD  levothyroxine (SYNTHROID, LEVOTHROID) 25 MCG tablet Take 1 tablet (25 mcg total) by mouth daily before breakfast. 12/20/13  Yes David Stall, MD  lisinopril (PRINIVIL,ZESTRIL) 10 MG tablet Take 10 mg by mouth daily.   Yes Historical Provider, MD  NOVOLOG MIX 70/30 FLEXPEN (70-30) 100 UNIT/ML FlexPen USE AS DIRECTED TAKE 32 UNITS IN THE MORNING AND 23 UNITS IN THE EVENING 09/16/14  Yes David Stall, MD  [facility] DELICA LANCETS 33G  MISC Test BG 8 times daily. 06/19/14 06/20/15 Yes David Stall, MD  oxyCODONE-acetaminophen (PERCOCET/ROXICET) 5-325 MG per tablet Take 2 tablets by mouth every 4 (four) hours as needed for severe pain. 11/05/14   Eyvonne Mechanic, PA-C  sulfamethoxazole-trimethoprim (BACTRIM DS,SEPTRA DS) 800-160 MG per tablet Take 1 tablet by mouth 2 (two) times daily. 11/05/14 11/12/14  Tinnie Gens Nishka Heide, PA-C   BP 125/86 mmHg  Pulse 79  Temp(Src) 98.4 F (36.9 C)  Resp 18  SpO2 100% Physical Exam  Constitutional: He is oriented to person, place, and time. He appears well-developed and well-nourished.  HENT:  Head: Normocephalic  and atraumatic.  Eyes: Conjunctivae are normal. Pupils are equal, round, and reactive to light. Right eye exhibits no discharge. Left eye exhibits no discharge. No scleral icterus.  Neck: Normal range of motion. No JVD present. No tracheal deviation present.  Pulmonary/Chest: Effort normal. No stridor.  Musculoskeletal:  Very minimal edema to the left foot, no erythema, warmth, tenderness palpation full range of motion perfusion intact, sensation intact. Patient is tender to the point of abscess, remainder of calf and up her leg ankle and foot pain free with palpation both light and deep.  Neurological: He is alert and oriented to person, place, and time. Coordination normal.  Skin:  Multiple healed wounds throughout arms and legs. Left calf with approximately 4 cm of surrounding erythema, with an area of fluctuance. Minimally tender to palpation.   Psychiatric: He has a normal mood and affect. His behavior is normal. Judgment and thought content normal.  Nursing note and vitals reviewed.   ED Course  Procedures (including critical care time)  EMERGENCY DEPARTMENT US SOFT TISSUE INTERPRETATION "Study: Limited Ultrasound of the noted body part in comments below"  INDICATIONS: Soft tissue infection Multiple views of the body part are obtained with a multi-frequency linear probe  PERFORMED BY:  Myself  IMAGES ARCHIVED?: Yes  SIDE:Left  BODY PART:Lower extremity  FINDINGS: Abcess present  LIMITATIONS:  Body Habitus  INTERPRETATION:  Abcess present  COMMENT:     INCISION AND DRAINAGE Performed by: Thermon Leyland Consent: Verbal consent obtained. Risks and benefits: risks, benefits and alternatives were discussed Type: abscess  Body area: left calf  Anesthesia: local infiltration  Incision was made with a scalpel.  Local anesthetic: lidocaine 2% epi 1:200000  Anesthetic total: 4 ml  Complexity: complex Blunt dissection to break up loculations  Drainage:  purulent  Drainage amount: 2 cc  Packing material: none  Patient tolerance: Patient tolerated the procedure well with no immediate complications.   Labs Review Labs Reviewed  CBG MONITORING, ED - Abnormal; Notable for the following:    Glucose-Capillary 121 (*)    All other components within normal limits  CBC WITH DIFFERENTIAL/PLATELET    Imaging Review No results found.   EKG Interpretation None      MDM   Final diagnoses:  Abscess    Labs: CBG 121, CBC no significant findings  Imaging: Bedside ultrasound  Consults: None  Therapeutics: Lidocaine, epi, Percocet, Bactrim  Assessment: Abscess  Plan: She presents with an abscess to his left calf times one week. He has no systemic symptoms, CBG only slightly elevated at 121, CBC no significant findings. Clearly localized, I&D performed with purulent drainage. Patient handled the procedure well, was discharged home with strict return precautions, instructions to follow-up in 2-3 days for reevaluation, encouraged follow-up immediately if new or worsening or concerning signs or symptoms presented. He'll be given a prescription for Bactrim, Percocet as needed  for pain, instructions to use ibuprofen as needed. Both the patient and his mother verbalized understanding and agreement today's plan, and assured that they do have adequate follow-up evaluation, and will return if new or worsening signs or symptoms presented.      Eyvonne Mechanic, PA-C 11/08/14 0100  Pricilla Loveless, MD 11/09/14 [phone]

## 2014-11-05 NOTE — ED Notes (Signed)
Pt to ED with mother. Pt presents with abscess to left lower calf and onset of swelling to leg and foot last night. Denies pain at present. Area with minimal drainage

## 2014-11-05 NOTE — Discharge Instructions (Signed)
Abscess An abscess is an infected area that contains a collection of pus and debris.It can occur in almost any part of the body. An abscess is also known as a furuncle or boil. CAUSES  An abscess occurs when tissue gets infected. This can occur from blockage of oil or sweat glands, infection of hair follicles, or a minor injury to the skin. As the body tries to fight the infection, pus collects in the area and creates pressure under the skin. This pressure causes pain. People with weakened immune systems have difficulty fighting infections and get certain abscesses more often.  SYMPTOMS Usually an abscess develops on the skin and becomes a painful mass that is red, warm, and tender. If the abscess forms under the skin, you may feel a moveable soft area under the skin. Some abscesses break open (rupture) on their own, but most will continue to get worse without care. The infection can spread deeper into the body and eventually into the bloodstream, causing you to feel ill.  DIAGNOSIS  Your caregiver will take your medical history and perform a physical exam. A sample of fluid may also be taken from the abscess to determine what is causing your infection. TREATMENT  Your caregiver may prescribe antibiotic medicines to fight the infection. However, taking antibiotics alone usually does not cure an abscess. Your caregiver may need to make a small cut (incision) in the abscess to drain the pus. In some cases, gauze is packed into the abscess to reduce pain and to continue draining the area. HOME CARE INSTRUCTIONS   Only take over-the-counter or prescription medicines for pain, discomfort, or fever as directed by your caregiver.  If you were prescribed antibiotics, take them as directed. Finish them even if you start to feel better.  If gauze is used, follow your caregiver's directions for changing the gauze.  To avoid spreading the infection:  Keep your draining abscess covered with a  bandage.  Wash your hands well.  Do not share personal care items, towels, or whirlpools with others.  Avoid skin contact with others.  Keep your skin and clothes clean around the abscess.  Keep all follow-up appointments as directed by your caregiver. SEEK MEDICAL CARE IF:   You have increased pain, swelling, redness, fluid drainage, or bleeding.  You have muscle aches, chills, or a general ill feeling.  You have a fever. MAKE SURE YOU:   Understand these instructions.  Will watch your condition.  Will get help right away if you are not doing well or get worse. Document Released: 02/17/2005 Document Revised: 11/09/2011 Document Reviewed: 07/23/2011 [facility] [place] [facility] Patient Information 2015 [place], [place]. This information is not intended to replace advice given to you by your health care provider. Make sure you discuss any questions you have with your health care provider.   Abscess Care After An abscess (also called a boil or furuncle) is an infected area that contains a collection of pus. Signs and symptoms of an abscess include pain, tenderness, redness, or hardness, or you may feel a moveable soft area under your skin. An abscess can occur anywhere in the body. The infection may spread to surrounding tissues causing cellulitis. A cut (incision) by the surgeon was made over your abscess and the pus was drained out. Gauze may have been packed into the space to provide a drain that will allow the cavity to heal from the inside outwards. The boil may be painful for 5 to 7 days. Most people with a boil do not  have high fevers. Your abscess, if seen early, may not have localized, and may not have been lanced. If not, another appointment may be required for this if it does not get better on its own or with medications. HOME CARE INSTRUCTIONS   Only take over-the-counter or prescription medicines for pain, discomfort, or fever as directed by your caregiver.  When you bathe, soak and then  remove gauze or iodoform packs at least daily or as directed by your caregiver. You may then wash the wound gently with mild soapy water. Repack with gauze or do as your caregiver directs. SEEK IMMEDIATE MEDICAL CARE IF:   You develop increased pain, swelling, redness, drainage, or bleeding in the wound site.  You develop signs of generalized infection including muscle aches, chills, fever, or a general ill feeling.  An oral temperature above 102 F (38.9 C) develops, not controlled by medication. See your caregiver for a recheck if you develop any of the symptoms described above. If medications (antibiotics) were prescribed, take them as directed. Document Released: 11/26/2004 Document Revised: 08/02/2011 Document Reviewed: 07/24/2007 [facility] Patient Information 2015 [place], [place]. This information is not intended to replace advice given to you by your health care provider. Make sure you discuss any questions you have with your health care provider.  Please read attached information. Please use warm soaks, keep wound clean. Please follow-up with your primary care provider or [facility] in 2 days for reevaluation. Please seek immediate medical care if new or worsening signs or symptoms present. Ibuprofen or Tylenol can be used for pain, Percocet as needed for breakthrough pain.

## 2014-11-18 ENCOUNTER — Other Ambulatory Visit: Payer: Self-pay

## 2014-11-27 ENCOUNTER — Other Ambulatory Visit: Payer: Self-pay | Admitting: "Endocrinology

## 2014-12-09 ENCOUNTER — Ambulatory Visit: Payer: Medicare Other | Admitting: "Endocrinology

## 2014-12-22 ENCOUNTER — Other Ambulatory Visit: Payer: Self-pay | Admitting: "Endocrinology

## 2014-12-25 ENCOUNTER — Encounter: Payer: Self-pay | Admitting: "Endocrinology

## 2014-12-25 ENCOUNTER — Ambulatory Visit (INDEPENDENT_AMBULATORY_CARE_PROVIDER_SITE_OTHER): Payer: Medicare Other | Admitting: "Endocrinology

## 2014-12-25 VITALS — BP 127/89 | HR 73 | Wt 132.0 lb

## 2014-12-25 DIAGNOSIS — E1065 Type 1 diabetes mellitus with hyperglycemia: Secondary | ICD-10-CM | POA: Diagnosis not present

## 2014-12-25 DIAGNOSIS — E049 Nontoxic goiter, unspecified: Secondary | ICD-10-CM | POA: Diagnosis not present

## 2014-12-25 DIAGNOSIS — E10649 Type 1 diabetes mellitus with hypoglycemia without coma: Secondary | ICD-10-CM | POA: Diagnosis not present

## 2014-12-25 DIAGNOSIS — R809 Proteinuria, unspecified: Secondary | ICD-10-CM | POA: Insufficient documentation

## 2014-12-25 DIAGNOSIS — E038 Other specified hypothyroidism: Secondary | ICD-10-CM | POA: Diagnosis not present

## 2014-12-25 DIAGNOSIS — IMO0002 Reserved for concepts with insufficient information to code with codable children: Secondary | ICD-10-CM

## 2014-12-25 DIAGNOSIS — E782 Mixed hyperlipidemia: Secondary | ICD-10-CM | POA: Insufficient documentation

## 2014-12-25 DIAGNOSIS — E063 Autoimmune thyroiditis: Secondary | ICD-10-CM

## 2014-12-25 LAB — GLUCOSE, POCT (MANUAL RESULT ENTRY): POC GLUCOSE: 241 mg/dL — AB (ref 70–99)

## 2014-12-25 NOTE — Patient Instructions (Signed)
Follow up visit in 3 months. Continue the morning 70/30 insulin dose of 32 units, but reduce the dinner dose of 70/30 insulin to 21 units.

## 2014-12-25 NOTE — Progress Notes (Signed)
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  HISTORY OF PRESENT ILLNESS:   [name] is a 29 y.o. Caucasian young man.  [name] was accompanied by his mother.    1. The patient was diagnosed with T1DM at age 71 in about 2001 or 2002. We have few details about his medical care until 2007. We know that in about 2006 he saw a pediatric endocrinologist at [facility], [facility], [facility]. Unfortunately, the family chose not to return to see the endocrinologist. In about April of 2007, his primary care provider, Dr. Reed Breech of [facility], started the patient on an insulin pump. Unfortunately, neither the patient nor the family were capable of utilizing the pump's technology effectively. On 10/02/2005 the patient was admitted to the [facility] for poorly controlled type 1 diabetes. I consulted on the patient at that time. 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 He did not have the insight or intelligence to perform the daily tasks of diabetes self-care independently or to utilize the insulin pump. It also appeared that there was not a great deal of adult supervision and support within his family. I changed his regimen to Novolog Mix 70/30 insulin taken twice daily. He was to take 26 units each morning at breakfast and 14 units each evening at supper. I also gave him a sliding scale for Humalog lispro with different doses of insulin to be used at breakfast, lunch, supper, and bedtime in addition  to his 70/30 insulin when his family members were available to check the blood sugars and give the additional insulin. The patient's family members agreed to supervise his care.  2. Unfortunately, the patient has frequently not had the adult supervision and support that he needed. In the last 9 years, the patient has been scheduled to return to our clinic for follow up visits approximately every 3 months, but has actually only returned to our clinic for follow up visits 1-3 times per year. In 2012 he had 3 visits and was admitted once. In 2013 he had one admission in December, followed by a clinic visit later that month. In 2014 he had 4 clinic visits. In 2015 he had two clinic visits. In fairness, however, I should note that I was unavailable to see him for two months in 2015 due to my severe leg injury and post-operative recuperation. Today's visit was his third visit in 2016, so compliance with appointments has been improving.  3. His hemoglobin A1c values have been mostly in the 10.6-14.0% range. He has the following complications of diabetes: diabetic angiopathy, peripheral neuropathy, autonomic neuropathy, inappropriate sinus tachycardia, urinary microalbuminuria, and occasional but significant hypoglycemia. In November 2007, the patient was diagnosed with hypothyroidism secondary to Hashimoto's disease and was started on Synthroid at a dose of 25 mcg per day. In November 2008, he was diagnosed with hypertension and started on lisinopril, 5 mg per day. In July 2014 he was diagnosed with hypercholesterolemia and started on atorvastatin, 10 mg/day.  4. At the time of his last clinic visit on 09/09/14, the hemoglobin A1c was 11.9%.   A. In the interim, he has been generally healthy. In  June, however, he went to the ED for a left calf abscess, presumably caused by his persistent scratching and excoriation of his skin. The abscess was drained and he was treated with oral antibiotics. That area has continued  to heal, but mom still applies hydrogen peroxide twice daily. since healed.   B. Mom and [name] state that he is doing better about checking BGs and taking insulins, "but still not great". He has also been better about taking his Synthroid, atorvastatin, and lisinopril. He does still have Medicare coverage, but his Medicaid was stopped.  Providing adequate adult supervision for [name] is still a problem. Mom was not able to switch to a daytime job, but is trying to do so.   C.  He has been trying not to scratch and excoriate his skin very often, but still does so frequently. He is still supposed to take Novolog 70/30 insulin, 32 units in AM and 23 units in the PM, but misses doses at times. He often consumes very large amounts of carbs. He is supposed to be taking 25 mcg of Synthroid daily, 10 mg of lisinopril daily, and 10 mg of atorvastatin daily. Although he has been doing better recently, he still often misses doses.   5. Pertinent Review of Systems:  Constitutional: The patient feels "good overall, a lot better, and much happier". Mom says that, "He is a lot less irritable and much easier to get along with." He no longer feels tired. He still gets frustrated about what he can't do in terms of activities, but is less depressed.   Eyes: Vision is a little better now with his glasses. He is legally blind. He had his annual eye exam about 5 months ago.  He also saw a retinologist, Dr. Ward Givens, who saw no evidence of diabetic eye disease, but noted that his retinitis pigmentosa is "getting a little worse". The retinologist follows him every 6 months.  Neck: The patient has no complaints of anterior neck swelling, soreness, tenderness,  pressure, discomfort, or difficulty swallowing.  Heart: The patient has no complaints of palpitations, irregular heat beats, chest pain, or chest pressure. Gastrointestinal: He no longer has bloating after meals. Bowel movents seem normal. The patient has no other complaints of  excessive hunger, acid reflux, stomach aches or pains, diarrhea, or constipation. Legs: As above. Muscle mass and strength seem normal. There are no complaints of numbness, tingling, burning, or pain. No edema is noted. Feet: There are no obvious foot problems. There are no recent complaints of numbness, tingling, burning, or pain. No edema is noted. GU: He has not been having nocturia very often.    Hypoglycemia: He has had more frequent low BG symptoms.    6. BG meter review: He checks BGs from 0-5 times daily, compared with 0-2 times daily at his last visit. There was only one day that he did not have BG checks, compared with no BG checks on 17 days out of the past 28 days at his last visit. His average BG was 238 this month, compared with 312 at the prior visit. He has had seven BGs < 70, ranging from 53-67. He has had fourteen BGs > 400. BG range was 53 to >600. Morning BGs range from 53-587. Lunch BGs range from 60-> 600. Dinner BGs range from 66-305. Bedtime BGs range from 140-446. It is obvious that he is checking BGs more often and taking his insulin more often.  PAST MEDICAL, FAMILY, AND SOCIAL HISTORY:  Past Medical History  Diagnosis Date  . Diabetes mellitus     Type 1, diagnosed age 49, with frequent DKA admissions, difficult to control due to MR  . Hypothyroidism   . Diabetic nephropathy     Started on Lisinopril 5 mg  . 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 Impaired cognition     mention of mental retardation  . Goiter   . Moderate or severe vision impairment, both eyes, impairment level not further specified   . Mental retardation   . Hypoglycemia associated with diabetes   . Hypertension   . Thyroiditis, autoimmune   . Angiopathy, diabetic   . Diabetic peripheral neuropathy   . Dysmorphic features     Family History  Problem Relation Age of Onset  . Vision loss Father   . Diabetes Maternal Grandmother     AODM  . Diabetes Paternal Grandmother      AODM  . Diabetes Cousin     Second cousin has juvenile-onset DM.  Marland Kitchen Retinitis pigmentosa Mother      Current outpatient prescriptions:  .  atorvastatin (LIPITOR) 10 MG tablet, TAKE 1 TABLET (10 MG TOTAL) BY MOUTH DAILY., Disp: 90 tablet, Rfl: 2 .  glucose blood (ONETOUCH VERIO) test strip, Check blood sugar 10 x daily, Disp: 300 each, Rfl: 6 .  levothyroxine (SYNTHROID, LEVOTHROID) 25 MCG tablet, Take 1 tablet (25 mcg total) by mouth daily before breakfast., Disp: 90 tablet, Rfl: 4 .  lisinopril (PRINIVIL,ZESTRIL) 10 MG tablet, Take 10 mg by mouth daily., Disp: , Rfl:  .  NOVOLOG MIX 70/30 FLEXPEN (70-30) 100 UNIT/ML FlexPen, USE AS DIRECTED TAKE 32 UNITS IN THE MORNING AND 23 UNITS IN THE EVENING, Disp: 5 pen, Rfl: 6 .  ONETOUCH DELICA LANCETS 33G MISC, Test BG 8 times daily., Disp: 240 each, Rfl: 6 .  ibuprofen (ADVIL,MOTRIN) 200 MG tablet, Take 400-600 mg by mouth every 6 (six) hours as needed for moderate pain., Disp: , Rfl:  .  oxyCODONE-acetaminophen (PERCOCET/ROXICET) 5-325 MG per tablet, Take 2 tablets by mouth every 4 (four) hours as needed for severe pain. (Patient not taking: Reported on 12/25/2014), Disp: 15 tablet, Rfl: 0  Allergies as of 12/25/2014  . (No Known Allergies)    1. Work and Family:  Since being laid off from his job at the [employer], he has been unemployed. He occasionally volunteers at the [employer]. He would like to go back to work. He is still living at home.   2. Activities: He walks outside when the weather is better.  3. Smoking, alcohol, or drugs: No tobacco or alcohol or drugs.  4. Primary Care Provider: None at present  REVIEW OF SYSTEMS: There are no other significant problems involving Rogue's other body systems.   Objective:  Vital Signs:  BP 127/89 mmHg  Pulse 73  Wt 132 lb (59.875 kg)   Ht Readings from Last 3 Encounters:  05/10/12 5\' 5"  (1.651 m)  04/30/11 5\' 5"  (1.651 m)   Wt Readings from Last 3 Encounters:   12/25/14 132 lb (59.875 kg)  09/09/14 129 lb 6.4 oz (58.695 kg)  06/19/14 129 lb (58.514 kg)   There is no height on file to calculate BSA.  Normalized stature-for-age data available only for age 37 to 20 years. Normalized weight-for-age data available only for age 37 to 20 years.  PHYSICAL EXAM:  Constitutional: The patient appears healthy and well nourished today. His weight has increased three pounds. He was fairly  normally engaged and very interactive today. He had a very good sense of humor and was very proud of himself for taking better care of his T1DM.  His thought processes are still fairly  concrete. His insight remains relatively poor.Marland Kitchen He really does not understand or comprehend very much about diabetes, about why he needs to take care of himself, and about what will happen to him if he does not take care of himself. He does now understand that when his BGs are lower he feels better physically and emotionally. He likes being the "new [name]" who takes better care of his DM more on his own and who feels better physically and emotionally as a result..  Face: The face is somewhat dysmorphic.  Eyes: There is no obvious arcus or proptosis. His eyes are fairly moist.   Mouth: The oropharynx appears normal. His geographic tongue has resolved. His mouth is moist. His oral hygiene is better today. Neck: The neck appears to be visibly normal. No carotid bruits are noted. The thyroid gland is still a bit enlarged at 21-22 grams in size. Both lobes are equally enlarged. The consistency of the thyroid gland is fairly firm today. The thyroid gland is not tender to palpation. Lungs: The lungs are clear to auscultation. Air movement is good. Heart: Heart rate and rhythm are regular. Heart sounds S1 and S2 are normal. I did not appreciate any pathologic cardiac murmurs. Abdomen: The abdomen is normal in size. Bowel sounds are normal. There is no obvious hepatomegaly, splenomegaly, or other mass effect.   Arms: Muscle size and bulk are normal for age. He has several new areas of excoriation on his arms. The old excoriations are healing. Hands: There is no obvious tremor. Phalangeal and metacarpophalangeal joints are normal. Palmar muscles are normal. Palmar skin is normal. Palmar moisture is also normal. Legs: Muscles appear normal for age. No edema is present. Feet: Feet are normally formed, but are cool to touch. Dorsalis pedal pulses are faint 1+ bilaterally. Right PT pulse is 1+. Left PT pulse is faint 1+.  Neurologic: Strength is normal for age in both the upper and lower extremities. Muscle tone is normal. Sensation to touch is normal in both the legs and feet.   Skin: He does not have any open excoriations today.   LAB DATA: .  Labs 09/09/14: HbA1c 11.9%; TSH 1.822, free T4 1.02, and free T3 2.6; CMP normal except for glucose of 273; cholesterol 174, triglycerides 181, HDL 46, LDL 92; urinary microalbumin/creatinine ratio 139.2  Labs 06/19/14: HbA1c 10.7%  Labs 05/12/14: CMP normal  Labs 02/19/14: HbA1c 10.2%  Labs 07/31/13: HbA1c 8.5%; CMP normal, except glucose 269; microalbumin/creatinine ratio 51.6; TSH 1.885, free T4 1.31, free T3 3.4   Labs 7/018/14: CMP normal, except glucose of 14; cholesterol 202, triglycerides 85, HDL 58, LDL 127; urinary microalbumin/creatinine ratio 51.9; TSH 2.993, free T4 1.32, free T3 2.8  05/10/12: Creatinine 0.62, TSH 1.025,            Assessment and Plan:   ASSESSMENT:  1. Diabetes mellitus: The patient's BGs are doing much better overall, but he is now having more low BGs than before. He is taking better care of his T1DM on his own and recognizes that he feels better physically and emotionally when his BGs are better. He has actually gained 3 pounds, a sign that he is taking more insulin.  2. Hypertension: Blood pressure is better, but still too high today. He is still missing too many  doses of lisinopril.  He needs to take his lisinopril daily.   3. Peripheral neuropathy: This problem has improved and is not evident today, but will certainly recur if he does not get his BGs under better control.   4. Angiopathy: His angiopathy is still very severe, but he remains asymptomatic. If he exercises on a regular basis and controls his BGs, blood flow in his legs and feet will improve. 5. Goiter: Thyroid gland is about the same size today. The process of waxing and waning of thyroid gland size is c/w evolving Hashimoto's disease.  6. Hypoglycemia: He has had more frequent documented low BGs, mostly in the mornings. We need to reduce his insulin at dinner.    7. Hypothyroid: He was mid-range euthyroid in April.   8. Autonomic neuropathy, tachycardia, and gastroparesis: The tachycardia continues. His gastroparesis and bloating have improved, but will worsen again if his BGs continue to increase.  9. Hypercholesterolemia: Cholesterol and LDL were better in April. He needs to continue to take his atorvastatin.  10. Hypertriglyceridemia: His triglyceride level is inversely proportional to his insulin level. When he takes his insulins more consistently, his triglycerides will be lower.  11. Weight loss, unintentional: This process has improved.  12. Dehydration: He is adequately hydrated today.  13. Medical neglect: His family is doing  abetter job of supervising [name]' DM care and medical care.   14. 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  15. Microalbuminuria: His microalbumin/creatinine ratio in April was more elevated. He needs better control of both his BGs and his BPs. He also needs to take his lisinopril every day.    PLAN: 1. Diagnostic: HbA1c.   2. Therapeutic: Family members must actively supervise his insulin doses and BG checks. Continue 70/30 insulin at 32 units each morning, but reduce the 70.30 insulin at dinner to 21 units. Try to fit in a walk every day of 30-60 minutes duration. Take atorvastatin, Synthroid, and lisinopril daily  at whatever time will ensure better compliance. Continue the MVI daily. 3. Patient education: We discussed all of the above. Please cooperate with your mother, father, sister, aunt, and grandmother.  4. Follow-up: three months   Level of Service: This visit lasted in excess of 60 minutes. More than 50% of the visit was devoted to counseling.  David Stall, MD 12/25/2014 12:14 PM

## 2015-01-14 ENCOUNTER — Other Ambulatory Visit: Payer: Self-pay | Admitting: "Endocrinology

## 2015-02-14 DIAGNOSIS — H3552 Pigmentary retinal dystrophy: Secondary | ICD-10-CM | POA: Diagnosis not present

## 2015-02-14 DIAGNOSIS — E10319 Type 1 diabetes mellitus with unspecified diabetic retinopathy without macular edema: Secondary | ICD-10-CM | POA: Diagnosis not present

## 2015-02-14 DIAGNOSIS — E10329 Type 1 diabetes mellitus with mild nonproliferative diabetic retinopathy without macular edema: Secondary | ICD-10-CM | POA: Diagnosis not present

## 2015-04-09 ENCOUNTER — Encounter: Payer: Self-pay | Admitting: "Endocrinology

## 2015-04-09 ENCOUNTER — Ambulatory Visit (INDEPENDENT_AMBULATORY_CARE_PROVIDER_SITE_OTHER): Payer: Medicare Other | Admitting: "Endocrinology

## 2015-04-09 VITALS — BP 115/89 | HR 74 | Wt 128.7 lb

## 2015-04-09 DIAGNOSIS — Z23 Encounter for immunization: Secondary | ICD-10-CM

## 2015-04-09 DIAGNOSIS — R634 Abnormal weight loss: Secondary | ICD-10-CM

## 2015-04-09 DIAGNOSIS — E1043 Type 1 diabetes mellitus with diabetic autonomic (poly)neuropathy: Secondary | ICD-10-CM

## 2015-04-09 DIAGNOSIS — E049 Nontoxic goiter, unspecified: Secondary | ICD-10-CM

## 2015-04-09 DIAGNOSIS — E10649 Type 1 diabetes mellitus with hypoglycemia without coma: Secondary | ICD-10-CM

## 2015-04-09 DIAGNOSIS — E109 Type 1 diabetes mellitus without complications: Secondary | ICD-10-CM

## 2015-04-09 DIAGNOSIS — E063 Autoimmune thyroiditis: Secondary | ICD-10-CM

## 2015-04-09 DIAGNOSIS — E1151 Type 2 diabetes mellitus with diabetic peripheral angiopathy without gangrene: Secondary | ICD-10-CM

## 2015-04-09 DIAGNOSIS — E1065 Type 1 diabetes mellitus with hyperglycemia: Principal | ICD-10-CM

## 2015-04-09 DIAGNOSIS — R140 Abdominal distension (gaseous): Secondary | ICD-10-CM

## 2015-04-09 DIAGNOSIS — K3184 Gastroparesis: Secondary | ICD-10-CM

## 2015-04-09 DIAGNOSIS — E860 Dehydration: Secondary | ICD-10-CM

## 2015-04-09 DIAGNOSIS — IMO0001 Reserved for inherently not codable concepts without codable children: Secondary | ICD-10-CM

## 2015-04-09 DIAGNOSIS — I10 Essential (primary) hypertension: Secondary | ICD-10-CM

## 2015-04-09 DIAGNOSIS — E038 Other specified hypothyroidism: Secondary | ICD-10-CM

## 2015-04-09 LAB — GLUCOSE, POCT (MANUAL RESULT ENTRY): POC GLUCOSE: 146 mg/dL — AB (ref 70–99)

## 2015-04-09 LAB — POCT GLYCOSYLATED HEMOGLOBIN (HGB A1C): HEMOGLOBIN A1C: 10.4

## 2015-04-09 NOTE — Patient Instructions (Signed)
Follow up visit in 3 months. 

## 2015-04-09 NOTE — Progress Notes (Signed)
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  HISTORY OF PRESENT ILLNESS:   [name] is a 29 y.o. Caucasian young man.  [name] was accompanied by his mother.    1. The patient was diagnosed with T1DM at age 41 in about 2001 or 2002. We have few details about his medical care until 2007. We know that in about 2006 he saw a pediatric endocrinologist at [facility], [facility], [facility]. Unfortunately, the family chose not to return to see the endocrinologist. In about April of 2007, his primary care provider, Dr. Reed Breech of [facility], started the patient on an insulin pump. Unfortunately, neither the patient nor the family were capable of utilizing the pump's technology effectively. On 10/02/2005 the patient was admitted to the [facility] for poorly controlled type 1 diabetes. I consulted on the patient at that time. 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 He did not have the insight or intelligence to perform the daily tasks of diabetes self-care independently or to utilize the insulin pump. It also appeared that there was not a great deal of adult supervision and support within his family. We tried to teach him and his family how to follow a multiple daily injection of insulin plan with Lantus and Novolog insulin, but that attempt failed rapidly. I changed his regimen to Novolog Mix 70/30 insulin taken twice daily. He was to take 26 units each morning at breakfast and 14 units each evening  at supper. I also gave him a sliding scale for Humalog lispro with different doses of insulin to be used at breakfast, lunch, supper, and bedtime in addition to his 70/30 insulin when his family members were available to check the blood sugars and give the additional insulin. The patient's family members agreed to supervise his care.  2. Unfortunately, the patient has frequently not had the adult supervision and support that he needed. In the last 9 years, the patient has been scheduled to return to our clinic for follow up visits approximately every 3 months, but has actually only returned to our clinic for follow up visits 1-3 times per year. In 2012 he had 3 visits and was admitted once. In 2013 he had one admission in December, followed by a clinic visit later that month. In 2014 he had 4 clinic visits. In 2015 he had two clinic visits. In fairness, however, I should note that I was unavailable to see him for two months in 2015 due to my severe leg injury and post-operative recuperation. Today's visit was his fourth visit in 2016, so compliance with appointments has been improving.  3. His hemoglobin A1c values have been mostly in the 10.6-14.0% range. He has the following complications of diabetes: diabetic angiopathy, peripheral neuropathy, autonomic neuropathy, inappropriate sinus tachycardia, gastroparesis and postprandial abdominal bloating, urinary microalbuminuria, and occasional but significant hypoglycemia. In November 2007, the patient was diagnosed with hypothyroidism secondary to Hashimoto's disease and was started on Synthroid at a dose of 25 mcg per day. In November 2008, he was diagnosed with hypertension and started on lisinopril, 5 mg per day. In July 2014 he was diagnosed with hypercholesterolemia and started  on atorvastatin, 10 mg/day.  4. At the time of his last clinic visit on 12/25/14, our hemoglobin A1c meter was unavailable. I changed his Novolog Mix 70/30 insulin doses to 32 units  each morning and 21 units each evening.    A. In the interim, he has been generally healthy. Mom says that he got off track in terms of his DM care and his eating. He had some problems with his BG meter, has not been checking  BGs as often, and has missed some insulin doses. He has been feeling fuller af mealtimes and so has not been eating as much  B. He has not been able to obtain [place] Medicaid coverage, but does have Medicare.  Providing adequate adult supervision for [name] is still a problem, in part because his sister is pregnant and is having serious problems, so she has been receiving more attention and [name] less. Mom was not able to switch to a daytime job, but is still trying to do so.   C.  He has been trying not to scratch and excoriate his skin very often, but still does so fairly often. He is still supposed to take Novolog 70/30 insulin, 32 units in AM and 21 units in the PM, but misses doses at times. He often consumes very large amounts of carbs. He is supposed to be taking 25 mcg of Synthroid daily, 10 mg of lisinopril daily, and 10 mg of atorvastatin daily. He has missed many doses of these medications as well.  5. Pertinent Review of Systems:  Constitutional: The patient feels "good overall". Mom says that his irritability and cooperation with his medications varies with his BG levels. He does not feel depressed.   Eyes: Vision is a little better now with his glasses. He is legally blind. He had his annual eye exam about 8 months ago.  He sees his retinologist, Dr. Ward GivensYokum, every 6 months.  Neck: The patient has no complaints of anterior neck swelling, soreness, tenderness,  pressure, discomfort, or difficulty swallowing.  Heart: The patient has no complaints of palpitations, irregular heat beats, chest pain, or chest pressure. Gastrointestinal: He again has bloating after meals. Bowel movents seem normal. The patient has no other complaints of excessive hunger, acid reflux, stomach aches or  pains, diarrhea, or constipation. Legs: As above. Muscle mass and strength seem normal. There are no complaints of numbness, tingling, burning, or pain. No edema is noted. Feet: There are no obvious foot problems. There are no recent complaints of numbness, tingling, burning, or pain. No edema is noted. GU: He has recently had more nocturia.    Hypoglycemia: He has not had many low BGs.     6. BG meter review: He checks BGs from 0-4 times daily. There were two days this month that he did not perform BG checks. His average BG was 349 this month, compared with 238 at his last visit and with 312 at the prior visit. His BGs varied from 41-601. He has had two BGs < 70, a 41 and a 69. The BG of 41 occurred in the morning after he had not taken his insulin at dinner the night before, but then took it at 3:30 AM without food. He has had twenty-two BGs > 300 and fourteen BGs > 400. It is obvious that he is checking BGs less frequently and has been missing many more insulin doses.   PAST MEDICAL, FAMILY, AND SOCIAL HISTORY:  Past Medical History  Diagnosis Date  . Diabetes  mellitus     Type 1, diagnosed age 57, with frequent DKA admissions, difficult to control due to MR  . Hypothyroidism   . Diabetic nephropathy (HCC)     Started on Lisinopril 5 mg  . 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 Impaired cognition     mention of mental retardation  . Goiter   . Moderate or severe vision impairment, both eyes, impairment level not further specified   . Mental retardation   . Hypoglycemia associated with diabetes (HCC)   . Hypertension   . Thyroiditis, autoimmune   . Angiopathy, diabetic (HCC)   . Diabetic peripheral neuropathy (HCC)   . Dysmorphic features     Family History  Problem Relation Age of Onset  . Vision loss Father   . Diabetes Maternal Grandmother     AODM  . Diabetes Paternal Grandmother     AODM  . Diabetes Cousin     Second cousin has juvenile-onset DM.  Marland Kitchen  Retinitis pigmentosa Mother      Current outpatient prescriptions:  .  atorvastatin (LIPITOR) 10 MG tablet, TAKE 1 TABLET (10 MG TOTAL) BY MOUTH DAILY., Disp: 90 tablet, Rfl: 2 .  glucose blood (ONETOUCH VERIO) test strip, Check blood sugar 10 x daily, Disp: 300 each, Rfl: 6 .  ibuprofen (ADVIL,MOTRIN) 200 MG tablet, Take 400-600 mg by mouth every 6 (six) hours as needed for moderate pain., Disp: , Rfl:  .  levothyroxine (SYNTHROID, LEVOTHROID) 25 MCG tablet, Take 1 tablet (25 mcg total) by mouth daily before breakfast., Disp: 90 tablet, Rfl: 4 .  lisinopril (PRINIVIL,ZESTRIL) 10 MG tablet, Take 10 mg by mouth daily., Disp: , Rfl:  .  NOVOLOG MIX 70/30 FLEXPEN (70-30) 100 UNIT/ML FlexPen, USE AS DIRECTED TAKE 32 UNITS IN THE MORNING AND 23 UNITS IN THE EVENING, Disp: 5 pen, Rfl: 6 .  ONETOUCH DELICA LANCETS 33G MISC, Test BG 8 times daily., Disp: 240 each, Rfl: 6  Allergies as of 04/09/2015  . (No Known Allergies)    1. Work and Family:  Since being laid off from his job at the [employer], he has been unemployed. He occasionally volunteers at the [employer]. He would like to go back to work. He is still living at home.   2. Activities: He walks outside when the weather is better. He spends a lot of time helping to take care of his nephew.  3. Smoking, alcohol, or drugs: No tobacco or alcohol or drugs.  4. Primary Care Provider: None at present  REVIEW OF SYSTEMS: There are no other significant problems involving [name] other body systems.   Objective:  Vital Signs:  BP 115/89 mmHg  Pulse 74  Wt 128 lb 11.2 oz (58.378 kg)   Ht Readings from Last 3 Encounters:  05/10/12 5\' 5"  (1.651 m)  04/30/11 5\' 5"  (1.651 m)   Wt Readings from Last 3 Encounters:  04/09/15 128 lb 11.2 oz (58.378 kg)  12/25/14 132 lb (59.875 kg)  09/09/14 129 lb 6.4 oz (58.695 kg)   There is no height on file to calculate BSA.  Normalized stature-for-age data available only for age 54 to 20  years. Normalized weight-for-age data available only for age 54 to 20 years.  PHYSICAL EXAM:  Constitutional: The patient appears healthy and well nourished today. His weight has decreased four pounds. He was fairly normally engaged and fairly interactive today. He knows that he has not been taking good care of  his DM, but can't seem to control his actions and inactions. His thought processes are still fairly  concrete. His insight remains relatively poor.Marland Kitchen He really does not understand or comprehend very much about diabetes, about why he needs to take care of himself, and about what will happen to him if he does not take care of himself. Although he does understand that when his BGs are lower he feels better physically and emotionally, he can't translate that knowledge into actions. Face: The face is somewhat dysmorphic.  Eyes: There is no obvious arcus or proptosis. His eyes are fairly dry.   Mouth: The oropharynx appears normal. His geographic tongue has resolved. His mouth is moist. His oral hygiene is worse today. Neck: The neck appears to be visibly normal. No carotid bruits are noted. The thyroid gland is still a bit enlarged at 21-22 grams in size. Both lobes are equally enlarged. The consistency of the thyroid gland is softer today. The thyroid gland is not tender to palpation. Lungs: The lungs are clear to auscultation. Air movement is good. Heart: Heart rate and rhythm are regular. Heart sounds S1 and S2 are normal. I did not appreciate any pathologic cardiac murmurs. Abdomen: The abdomen is normal in size. Bowel sounds are normal. There is no obvious hepatomegaly, splenomegaly, or other mass effect.  Arms: Muscle size and bulk are normal for age. He has one new area of excoriation on his arms. The new lesion does not look infected. The old excoriations are healing. Hands: There is no obvious tremor. Phalangeal and metacarpophalangeal joints are normal. Palmar muscles are normal. Palmar skin  is normal. Palmar moisture is also normal. Legs: Muscles appear normal for age. No edema is present. Feet: Feet are normally formed, but are cool to touch. Dorsalis pedal pulses are faint 1+ bilaterally. Right PT pulse is faint 1+. Left PT pulse is faint 1+.  Neurologic: Strength is normal for age in both the upper and lower extremities. Muscle tone is normal. Sensation to touch is normal in both the legs and feet.      LAB DATA:   Labs 04/09/15: HbA1c 10.4%  Labs 09/09/14: HbA1c 11.9%; TSH 1.822, free T4 1.02, and free T3 2.6; CMP normal except for glucose of 273; cholesterol 174, triglycerides 181, HDL 46, LDL 92; urinary microalbumin/creatinine ratio 139.2  Labs 06/19/14: HbA1c 10.7%  Labs 05/12/14: CMP normal  Labs 02/19/14: HbA1c 10.2%  Labs 07/31/13: HbA1c 8.5%; CMP normal, except glucose 269; microalbumin/creatinine ratio 51.6; TSH 1.885, free T4 1.31, free T3 3.4   Labs 7/018/14: CMP normal, except glucose of 14; cholesterol 202, triglycerides 85, HDL 58, LDL 127; urinary microalbumin/creatinine ratio 51.9; TSH 2.993, free T4 1.32, free T3 2.8  05/10/12: Creatinine 0.62, TSH 1.025,            Assessment and Plan:   ASSESSMENT:  1. Diabetes mellitus: The patient's BGs are worse, but his HbA1c is better. The likely reason for this discrepancy is that he is having more low BGs than he recognizes. He is missing many insulin doses, but is also taking some doses  at odd times when he does not eat much, resulting in lower BGs. He still needs more adult supervision.   2. Hypertension: Blood pressure is worse due to missing too many lisinopril doses.  3. Peripheral neuropathy: This problem has improved and is not evident today, but will certainly recur if he does not get his BGs under better control.   4. Angiopathy: His angiopathy is still  fairly severe, but he remains asymptomatic. If he exercises on a regular basis and controls his BGs, blood flow in his legs and feet will improve. 5.  Goiter: Thyroid gland is about the same size today. The process of waxing and waning of thyroid gland size is c/w evolving Hashimoto's disease.  6. Hypoglycemia: He has had two documented low BGs this month, but has probably also had more unrecognized low BGs.     7. Hypothyroid: He was mid-range euthyroid in April 2016.   8. Autonomic neuropathy, tachycardia, and gastroparesis: The tachycardia is better, but the bloating and gastroparesis are worse.   9. Hypercholesterolemia: Cholesterol and LDL were better in April 2016 He needs to continue to take his atorvastatin.  10. Hypertriglyceridemia: His triglyceride level is inversely proportional to his insulin level. When he takes his insulins more consistently, his triglycerides will be lower.  11. Weight loss, unintentional: This process has recurred due in part to underinsulinization, but also due to decreased food intake when he has early prandial bloating..   12. Dehydration: He is mildly dehydrated today.  13. Medical neglect: His family is not doing as well at supervising [name] Ohm' DM care and medical care.   14. 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  15. Microalbuminuria: His microalbumin/creatinine ratio in April 2016 was more elevated. He needs better control of both his BGs and his BPs. He also needs to take his lisinopril every day.    PLAN: 1. Diagnostic: HbA1c today. .   2. Therapeutic: Family members must actively supervise his insulin doses and BG checks. Continue 70/30 insulin at 32 units each morning and 21 units each evening.  Try to fit in a walk every day of 30-60 minutes duration. Take atorvastatin, Synthroid, and lisinopril daily at whatever time will ensure better compliance. Continue the MVI daily. 3. Patient education: We discussed all of the above. Please cooperate with your mother, father, sister, aunt, and grandmother.  4. Follow-up: three months   Level of Service: This visit lasted in excess of 60 minutes. More  than 50% of the visit was devoted to counseling.  David Stall, MD 04/09/2015 9:44 AM

## 2015-05-05 ENCOUNTER — Other Ambulatory Visit: Payer: Self-pay | Admitting: "Endocrinology

## 2015-05-21 ENCOUNTER — Other Ambulatory Visit: Payer: Self-pay | Admitting: "Endocrinology

## 2015-05-21 DIAGNOSIS — IMO0001 Reserved for inherently not codable concepts without codable children: Secondary | ICD-10-CM

## 2015-05-21 DIAGNOSIS — E1065 Type 1 diabetes mellitus with hyperglycemia: Principal | ICD-10-CM

## 2015-06-20 DIAGNOSIS — E109 Type 1 diabetes mellitus without complications: Secondary | ICD-10-CM | POA: Diagnosis not present

## 2015-06-20 DIAGNOSIS — H3552 Pigmentary retinal dystrophy: Secondary | ICD-10-CM | POA: Diagnosis not present

## 2015-06-20 LAB — HM DIABETES EYE EXAM

## 2015-07-14 ENCOUNTER — Ambulatory Visit: Payer: Medicare Other | Admitting: "Endocrinology

## 2015-07-16 ENCOUNTER — Ambulatory Visit (INDEPENDENT_AMBULATORY_CARE_PROVIDER_SITE_OTHER): Payer: Medicare Other | Admitting: "Endocrinology

## 2015-07-16 ENCOUNTER — Encounter: Payer: Self-pay | Admitting: "Endocrinology

## 2015-07-16 VITALS — BP 133/94 | HR 82 | Wt 127.2 lb

## 2015-07-16 DIAGNOSIS — R809 Proteinuria, unspecified: Secondary | ICD-10-CM

## 2015-07-16 DIAGNOSIS — E109 Type 1 diabetes mellitus without complications: Secondary | ICD-10-CM | POA: Diagnosis not present

## 2015-07-16 DIAGNOSIS — E10649 Type 1 diabetes mellitus with hypoglycemia without coma: Secondary | ICD-10-CM | POA: Diagnosis not present

## 2015-07-16 DIAGNOSIS — E1065 Type 1 diabetes mellitus with hyperglycemia: Principal | ICD-10-CM

## 2015-07-16 DIAGNOSIS — E049 Nontoxic goiter, unspecified: Secondary | ICD-10-CM

## 2015-07-16 DIAGNOSIS — E782 Mixed hyperlipidemia: Secondary | ICD-10-CM

## 2015-07-16 DIAGNOSIS — E038 Other specified hypothyroidism: Secondary | ICD-10-CM

## 2015-07-16 DIAGNOSIS — I10 Essential (primary) hypertension: Secondary | ICD-10-CM

## 2015-07-16 DIAGNOSIS — E1143 Type 2 diabetes mellitus with diabetic autonomic (poly)neuropathy: Secondary | ICD-10-CM | POA: Diagnosis not present

## 2015-07-16 DIAGNOSIS — E1043 Type 1 diabetes mellitus with diabetic autonomic (poly)neuropathy: Secondary | ICD-10-CM | POA: Diagnosis not present

## 2015-07-16 DIAGNOSIS — IMO0001 Reserved for inherently not codable concepts without codable children: Secondary | ICD-10-CM

## 2015-07-16 DIAGNOSIS — E063 Autoimmune thyroiditis: Secondary | ICD-10-CM

## 2015-07-16 DIAGNOSIS — K3184 Gastroparesis: Secondary | ICD-10-CM

## 2015-07-16 DIAGNOSIS — E1042 Type 1 diabetes mellitus with diabetic polyneuropathy: Secondary | ICD-10-CM

## 2015-07-16 LAB — GLUCOSE, POCT (MANUAL RESULT ENTRY): POC Glucose: 517 mg/dl — AB (ref 70–99)

## 2015-07-16 LAB — POCT URINALYSIS DIPSTICK

## 2015-07-16 LAB — POCT GLYCOSYLATED HEMOGLOBIN (HGB A1C): HEMOGLOBIN A1C: 10.6

## 2015-07-16 NOTE — Patient Instructions (Signed)
Follow up visit in two months. Please have lab tests done 1-2 weeks prior to next visit.
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
## 2015-07-21 ENCOUNTER — Other Ambulatory Visit: Payer: Self-pay | Admitting: *Deleted

## 2015-07-21 DIAGNOSIS — E1065 Type 1 diabetes mellitus with hyperglycemia: Principal | ICD-10-CM

## 2015-07-21 DIAGNOSIS — IMO0001 Reserved for inherently not codable concepts without codable children: Secondary | ICD-10-CM

## 2015-09-12 DIAGNOSIS — E109 Type 1 diabetes mellitus without complications: Secondary | ICD-10-CM | POA: Diagnosis not present

## 2015-09-12 LAB — COMPREHENSIVE METABOLIC PANEL
ALT: 10 U/L (ref 9–46)
AST: 12 U/L (ref 10–40)
Albumin: 3.9 g/dL (ref 3.6–5.1)
Alkaline Phosphatase: 71 U/L (ref 40–115)
BUN: 15 mg/dL (ref 7–25)
CALCIUM: 9.5 mg/dL (ref 8.6–10.3)
CHLORIDE: 98 mmol/L (ref 98–110)
CO2: 31 mmol/L (ref 20–31)
Creat: 0.96 mg/dL (ref 0.60–1.35)
Glucose, Bld: 149 mg/dL — ABNORMAL HIGH (ref 70–99)
POTASSIUM: 4.4 mmol/L (ref 3.5–5.3)
Sodium: 133 mmol/L — ABNORMAL LOW (ref 135–146)
TOTAL PROTEIN: 6.9 g/dL (ref 6.1–8.1)
Total Bilirubin: 0.6 mg/dL (ref 0.2–1.2)

## 2015-09-12 LAB — MICROALBUMIN / CREATININE URINE RATIO
Creatinine, Urine: 156 mg/dL (ref 20–370)
Microalb Creat Ratio: 190 mcg/mg creat — ABNORMAL HIGH (ref <30)
Microalb, Ur: 29.6 mg/dL

## 2015-09-12 LAB — LIPID PANEL
CHOL/HDL RATIO: 3.5 ratio (ref <=5.0)
Cholesterol: 190 mg/dL (ref 125–200)
HDL: 55 mg/dL (ref >=40)
LDL Cholesterol: 117 mg/dL (ref <130)
Triglycerides: 90 mg/dL (ref <150)
VLDL: 18 mg/dL (ref <30)

## 2015-09-12 LAB — T4, FREE: Free T4: 1.3 ng/dL (ref 0.8–1.8)

## 2015-09-12 LAB — HEMOGLOBIN A1C
Hgb A1c MFr Bld: 10.3 % — ABNORMAL HIGH (ref <5.7)
Mean Plasma Glucose: 249 mg/dL

## 2015-09-12 LAB — TSH: TSH: 3.09 mIU/L (ref 0.40–4.50)

## 2015-09-12 LAB — T3, FREE: T3 FREE: 3.8 pg/mL (ref 2.3–4.2)

## 2015-09-15 ENCOUNTER — Ambulatory Visit: Payer: Medicare Other | Admitting: "Endocrinology

## 2015-09-17 ENCOUNTER — Other Ambulatory Visit: Payer: Self-pay | Admitting: "Endocrinology

## 2015-09-22 ENCOUNTER — Telehealth: Payer: Self-pay | Admitting: "Endocrinology

## 2015-09-22 ENCOUNTER — Other Ambulatory Visit: Payer: Self-pay | Admitting: *Deleted

## 2015-09-22 ENCOUNTER — Telehealth: Payer: Self-pay | Admitting: *Deleted

## 2015-09-22 NOTE — Telephone Encounter (Signed)
1. Mother called. [name] is about to run out of his Novolog Mix 70/30 insulin, but there is a problem with his indurance coverage, so the insurance will not pay for his insulin. She can't afford the $450.00 cost of the Novolog 70/30 pens. She asked me to call in a prescription for Novolog 70/[facility] vials to [facility] [place].  2. When [facility] I ordered the vial of Novolog 70/30 insulin and insulin sringes. 3. About one hour later I received a call from [facility]. The cost of the vial will be about $300.00 and mother can't afford that. I gave the pharmacist an order for Novolin 70/30 insulin vials. 4. I then called the mother again. I told her that the Novolin 70/30 insulin is somewhat different fthan the Novolog 70/30 The Novolin insulin does not have as much effect in the first 3 hours after a dose, but has more effect 8-12 hours after the dose. In order to reduce the risk of N

## 2015-09-22 NOTE — Telephone Encounter (Signed)
Advised mother we do not have any 70/30 samples. She is going to check the cost of vials and let us know if that will be cheaper.
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
## 2015-10-03 IMAGING — CR DG FOOT COMPLETE 3+V*L*
3 series · 3 of 3 positions shown · non-contrast
Comparison: None.

CLINICAL DATA: Left heel pain for 3 days

EXAM:
LEFT FOOT - COMPLETE 3+ VIEW

[t foot ap left]
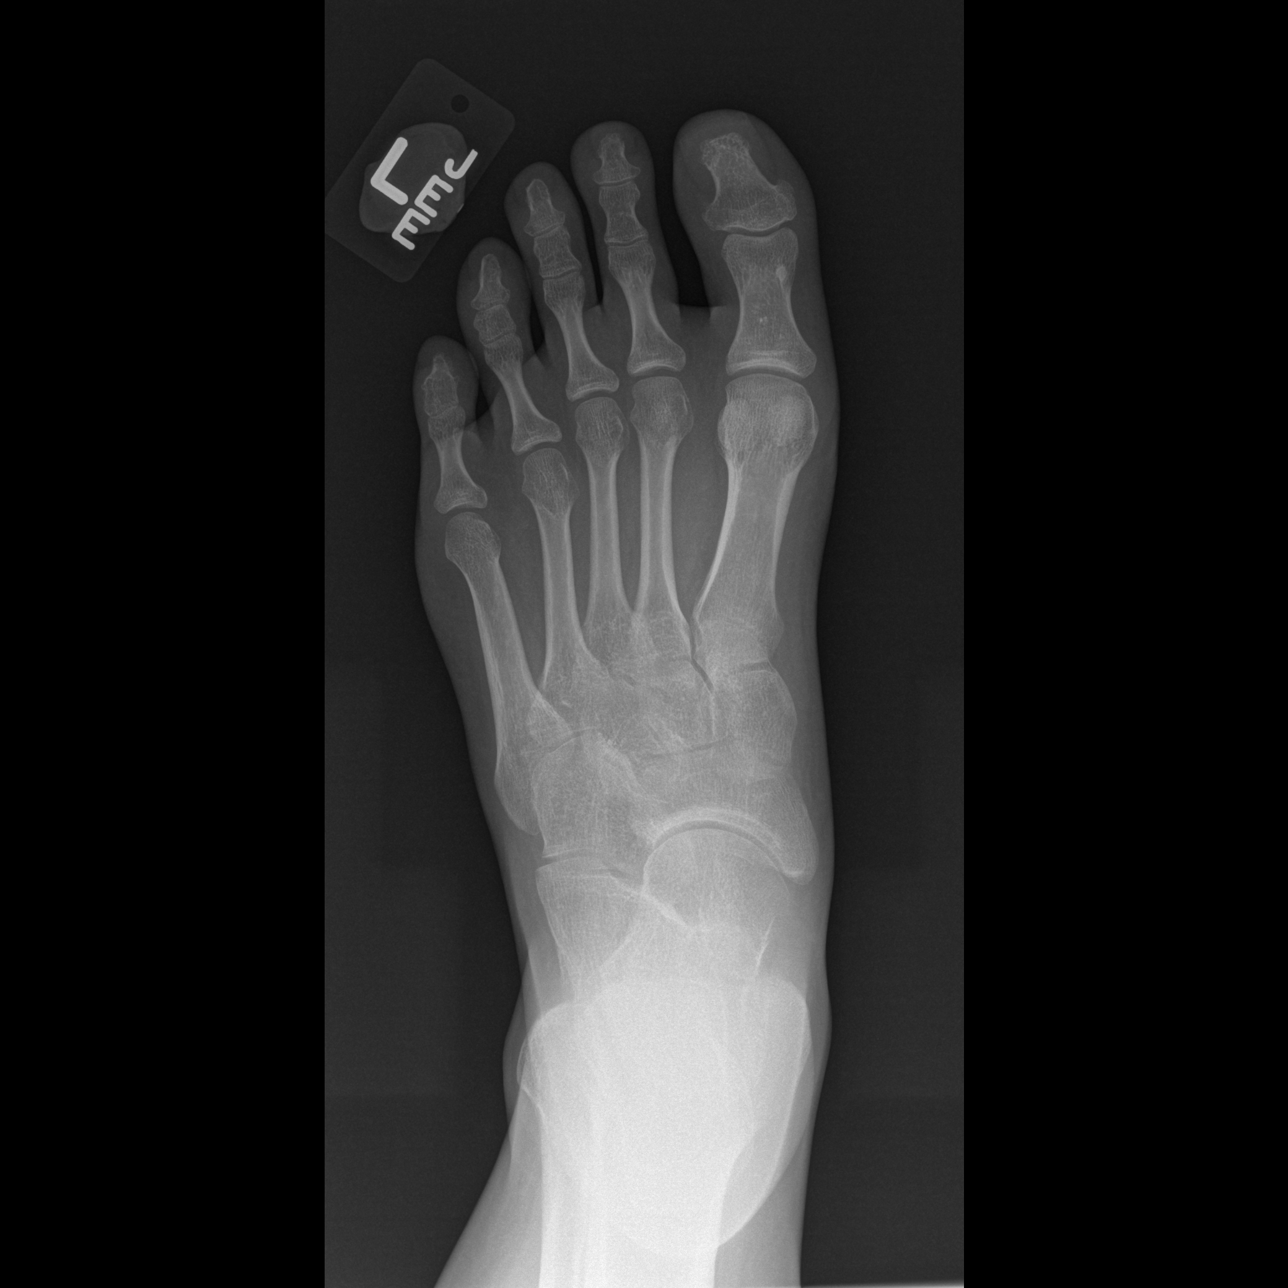

[t foot oblique left]
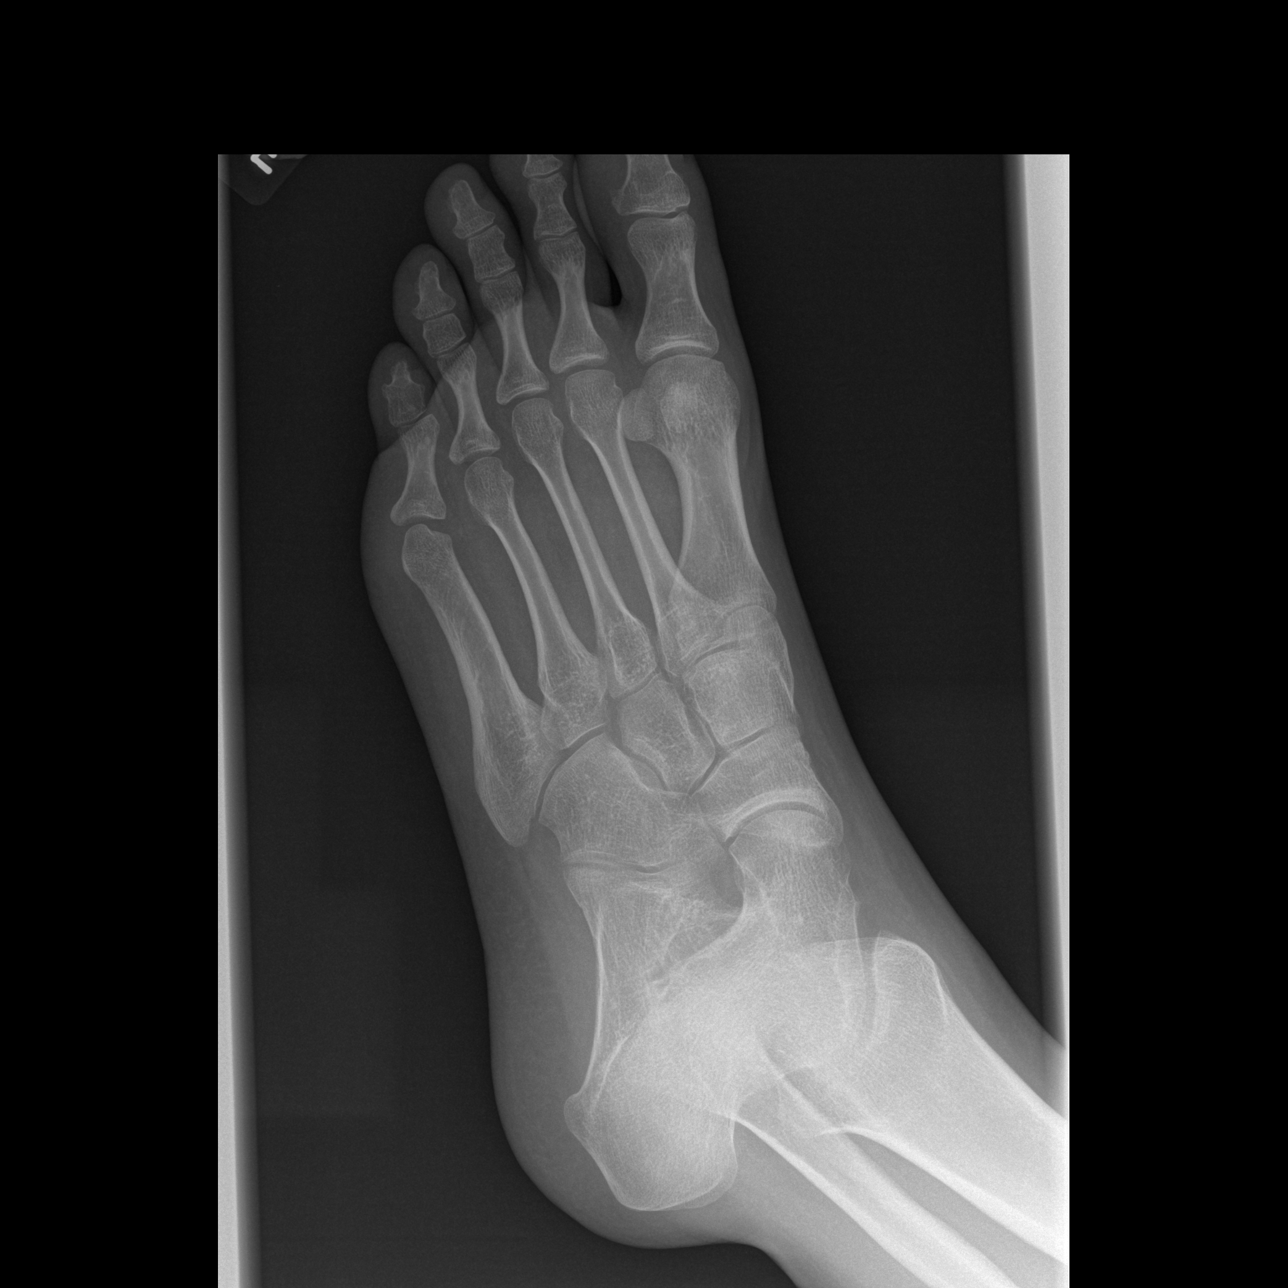

[t foot lat left]
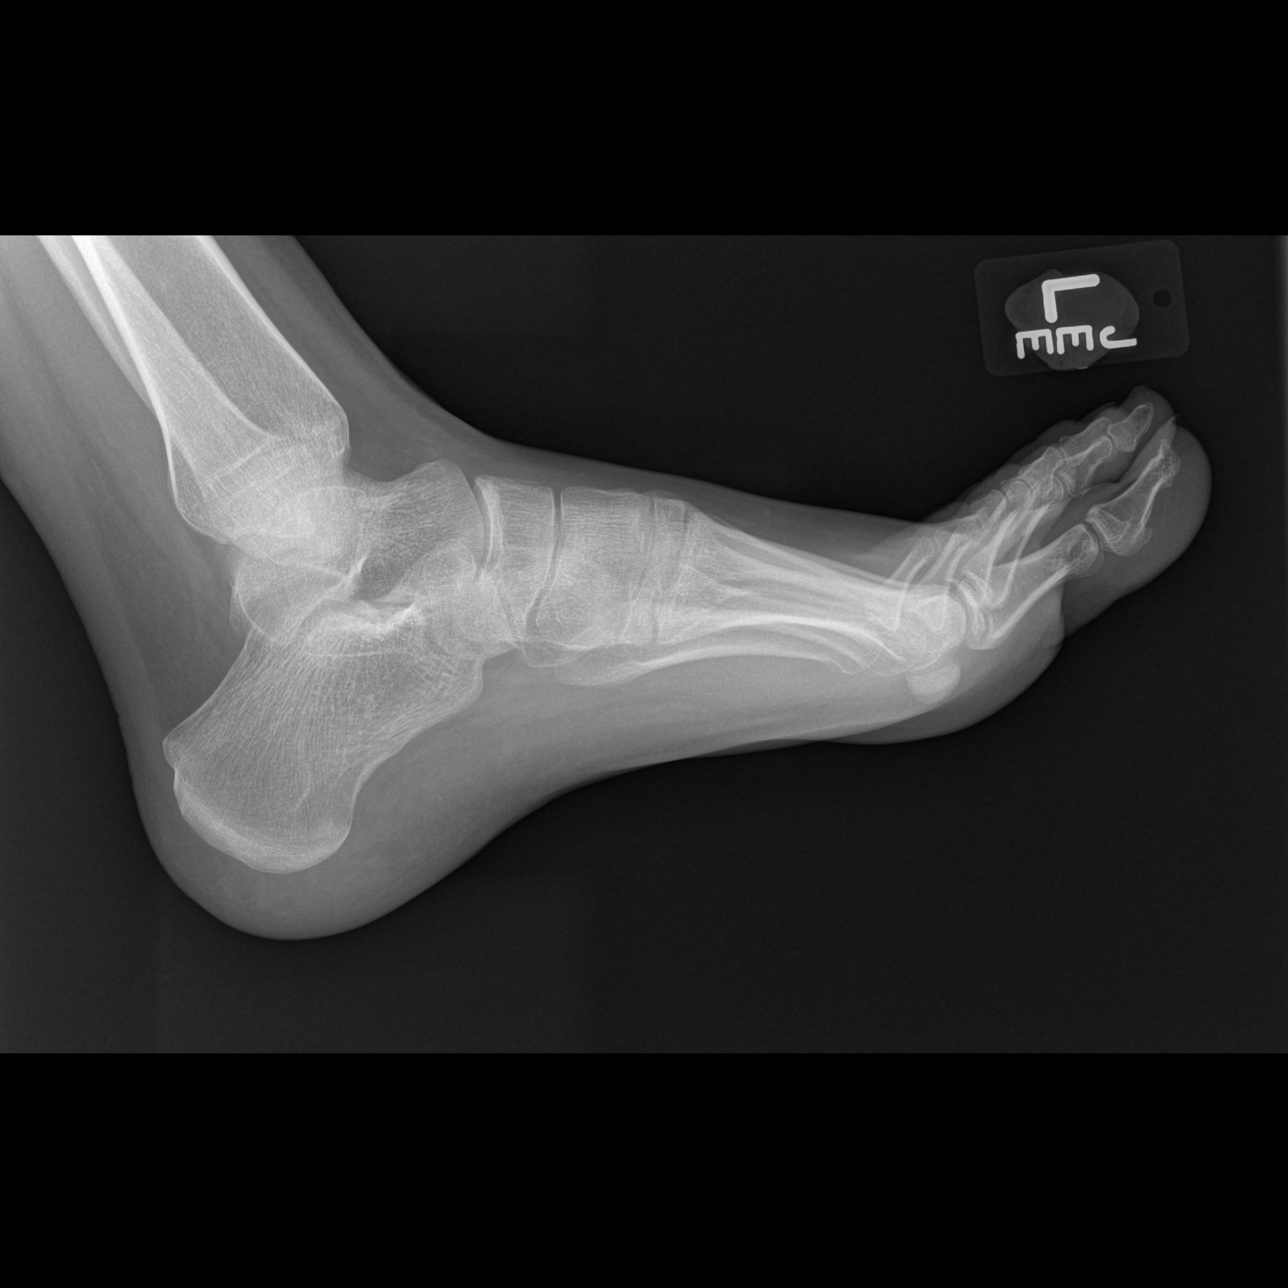

[3 of 3 positions shown; findings below may reference images not displayed]

FINDINGS: There is no evidence of fracture or dislocation. There is no plantar
calcaneal spur. Soft tissues are unremarkable.
IMPRESSION: No acute fracture or dislocation. No evidence of plantar calcaneal
spur.

## 2015-10-08 ENCOUNTER — Encounter: Payer: Self-pay | Admitting: *Deleted

## 2015-10-19 ENCOUNTER — Other Ambulatory Visit: Payer: Self-pay | Admitting: "Endocrinology

## 2015-10-27 ENCOUNTER — Telehealth: Payer: Self-pay | Admitting: "Endocrinology
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
## 2015-11-04 ENCOUNTER — Encounter: Payer: Self-pay | Admitting: "Endocrinology

## 2015-11-04 ENCOUNTER — Ambulatory Visit (INDEPENDENT_AMBULATORY_CARE_PROVIDER_SITE_OTHER): Payer: Medicare Other | Admitting: "Endocrinology

## 2015-11-04 VITALS — BP 128/88 | HR 77 | Wt 128.6 lb

## 2015-11-04 DIAGNOSIS — E782 Mixed hyperlipidemia: Secondary | ICD-10-CM | POA: Diagnosis not present

## 2015-11-04 DIAGNOSIS — L600 Ingrowing nail: Secondary | ICD-10-CM

## 2015-11-04 DIAGNOSIS — R634 Abnormal weight loss: Secondary | ICD-10-CM

## 2015-11-04 DIAGNOSIS — I10 Essential (primary) hypertension: Secondary | ICD-10-CM

## 2015-11-04 DIAGNOSIS — E049 Nontoxic goiter, unspecified: Secondary | ICD-10-CM

## 2015-11-04 DIAGNOSIS — E1029 Type 1 diabetes mellitus with other diabetic kidney complication: Secondary | ICD-10-CM

## 2015-11-04 DIAGNOSIS — E063 Autoimmune thyroiditis: Secondary | ICD-10-CM

## 2015-11-04 DIAGNOSIS — E038 Other specified hypothyroidism: Secondary | ICD-10-CM | POA: Diagnosis not present

## 2015-11-04 DIAGNOSIS — E1043 Type 1 diabetes mellitus with diabetic autonomic (poly)neuropathy: Secondary | ICD-10-CM | POA: Diagnosis not present

## 2015-11-04 DIAGNOSIS — E1065 Type 1 diabetes mellitus with hyperglycemia: Principal | ICD-10-CM

## 2015-11-04 DIAGNOSIS — R809 Proteinuria, unspecified: Secondary | ICD-10-CM

## 2015-11-04 DIAGNOSIS — E10649 Type 1 diabetes mellitus with hypoglycemia without coma: Secondary | ICD-10-CM

## 2015-11-04 DIAGNOSIS — R000 Tachycardia, unspecified: Secondary | ICD-10-CM | POA: Diagnosis not present

## 2015-11-04 DIAGNOSIS — F71 Moderate intellectual disabilities: Secondary | ICD-10-CM

## 2015-11-04 DIAGNOSIS — IMO0001 Reserved for inherently not codable concepts without codable children: Secondary | ICD-10-CM

## 2015-11-04 DIAGNOSIS — E109 Type 1 diabetes mellitus without complications: Secondary | ICD-10-CM | POA: Diagnosis not present

## 2015-11-04 DIAGNOSIS — T7401XD Adult neglect or abandonment, confirmed, subsequent encounter: Secondary | ICD-10-CM

## 2015-11-04 LAB — POCT GLYCOSYLATED HEMOGLOBIN (HGB A1C): HEMOGLOBIN A1C: 10.5

## 2015-11-04 LAB — GLUCOSE, POCT (MANUAL RESULT ENTRY): POC GLUCOSE: 218 mg/dL — AB (ref 70–99)

## 2015-11-04 NOTE — Patient Instructions (Signed)
Follow up visit in 2 months. Please call Dr. Fransico MichaelBrennan one week after mom is home with [facility] full-time to discuss BGs.
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
## 2015-12-19 ENCOUNTER — Encounter: Payer: Self-pay | Admitting: "Endocrinology

## 2015-12-19 DIAGNOSIS — H25813 Combined forms of age-related cataract, bilateral: Secondary | ICD-10-CM | POA: Diagnosis not present

## 2015-12-19 DIAGNOSIS — H25042 Posterior subcapsular polar age-related cataract, left eye: Secondary | ICD-10-CM | POA: Diagnosis not present

## 2015-12-19 DIAGNOSIS — E109 Type 1 diabetes mellitus without complications: Secondary | ICD-10-CM | POA: Diagnosis not present

## 2015-12-19 DIAGNOSIS — H3552 Pigmentary retinal dystrophy: Secondary | ICD-10-CM | POA: Diagnosis not present

## 2015-12-19 LAB — HM DIABETES EYE EXAM

## 2016-01-03 ENCOUNTER — Other Ambulatory Visit: Payer: Self-pay | Admitting: "Endocrinology

## 2016-01-05 ENCOUNTER — Encounter: Payer: Self-pay | Admitting: "Endocrinology

## 2016-01-05 ENCOUNTER — Ambulatory Visit (INDEPENDENT_AMBULATORY_CARE_PROVIDER_SITE_OTHER): Payer: Medicare Other | Admitting: "Endocrinology

## 2016-01-05 VITALS — BP 121/88 | HR 88 | Ht 64.17 in | Wt 124.4 lb

## 2016-01-05 DIAGNOSIS — E063 Autoimmune thyroiditis: Secondary | ICD-10-CM

## 2016-01-05 DIAGNOSIS — I4711 Inappropriate sinus tachycardia, so stated: Secondary | ICD-10-CM

## 2016-01-05 DIAGNOSIS — E1043 Type 1 diabetes mellitus with diabetic autonomic (poly)neuropathy: Secondary | ICD-10-CM | POA: Diagnosis not present

## 2016-01-05 DIAGNOSIS — E038 Other specified hypothyroidism: Secondary | ICD-10-CM

## 2016-01-05 DIAGNOSIS — T7401XD Adult neglect or abandonment, confirmed, subsequent encounter: Secondary | ICD-10-CM

## 2016-01-05 DIAGNOSIS — E109 Type 1 diabetes mellitus without complications: Secondary | ICD-10-CM

## 2016-01-05 DIAGNOSIS — E7800 Pure hypercholesterolemia, unspecified: Secondary | ICD-10-CM

## 2016-01-05 DIAGNOSIS — R809 Proteinuria, unspecified: Secondary | ICD-10-CM

## 2016-01-05 DIAGNOSIS — E10649 Type 1 diabetes mellitus with hypoglycemia without coma: Secondary | ICD-10-CM

## 2016-01-05 DIAGNOSIS — R000 Tachycardia, unspecified: Secondary | ICD-10-CM

## 2016-01-05 DIAGNOSIS — IMO0001 Reserved for inherently not codable concepts without codable children: Secondary | ICD-10-CM

## 2016-01-05 DIAGNOSIS — E1065 Type 1 diabetes mellitus with hyperglycemia: Principal | ICD-10-CM

## 2016-01-05 LAB — POCT GLYCOSYLATED HEMOGLOBIN (HGB A1C): HEMOGLOBIN A1C: 11

## 2016-01-05 LAB — GLUCOSE, POCT (MANUAL RESULT ENTRY): POC GLUCOSE: 518 mg/dL — AB (ref 70–99)

## 2016-01-05 NOTE — Patient Instructions (Signed)
Follow up visit in 2 months. Increase the Novolog 70/30 insulin dose to 34 units in the mornings and 20 units in the evenings. Please call if having BGs <80. Please repeat lab tests 2-3 weeks prior to next visit.
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
## 2016-01-18 ENCOUNTER — Emergency Department
Admission: EM | Admit: 2016-01-18 | Discharge: 2016-01-18 | Disposition: A | Discharge status: Home / Self Care | Payer: Medicare Other | Attending: Emergency Medicine | Admitting: Emergency Medicine

## 2016-01-18 DIAGNOSIS — E039 Hypothyroidism, unspecified: Secondary | ICD-10-CM | POA: Diagnosis not present

## 2016-01-18 DIAGNOSIS — E1365 Other specified diabetes mellitus with hyperglycemia: Secondary | ICD-10-CM | POA: Diagnosis not present

## 2016-01-18 DIAGNOSIS — Z794 Long term (current) use of insulin: Secondary | ICD-10-CM | POA: Diagnosis not present

## 2016-01-18 DIAGNOSIS — Z79899 Other long term (current) drug therapy: Secondary | ICD-10-CM | POA: Diagnosis not present

## 2016-01-18 DIAGNOSIS — I10 Essential (primary) hypertension: Secondary | ICD-10-CM | POA: Diagnosis not present

## 2016-01-18 DIAGNOSIS — E1065 Type 1 diabetes mellitus with hyperglycemia: Secondary | ICD-10-CM | POA: Diagnosis not present

## 2016-01-18 DIAGNOSIS — E860 Dehydration: Secondary | ICD-10-CM | POA: Insufficient documentation

## 2016-01-18 DIAGNOSIS — E109 Type 1 diabetes mellitus without complications: Secondary | ICD-10-CM

## 2016-01-18 LAB — COMPREHENSIVE METABOLIC PANEL
ALBUMIN: 4.3 g/dL (ref 3.5–5.0)
ALK PHOS: 70 U/L (ref 38–126)
ALT: 15 U/L — ABNORMAL LOW (ref 17–63)
ANION GAP: 11 (ref 5–15)
AST: 18 U/L (ref 15–41)
BILIRUBIN TOTAL: 0.9 mg/dL (ref 0.3–1.2)
BUN: 18 mg/dL (ref 6–20)
CALCIUM: 9.5 mg/dL (ref 8.9–10.3)
CO2: 24 mmol/L (ref 22–32)
Chloride: 105 mmol/L (ref 101–111)
Creatinine, Ser: 1.51 mg/dL — ABNORMAL HIGH (ref 0.61–1.24)
GFR calc non Af Amer: >60 mL/min (ref >60)
GLUCOSE: 243 mg/dL — AB (ref 65–99)
POTASSIUM: 4.9 mmol/L (ref 3.5–5.1)
SODIUM: 140 mmol/L (ref 135–145)
TOTAL PROTEIN: 7.7 g/dL (ref 6.5–8.1)

## 2016-01-18 LAB — CBC
HEMATOCRIT: 43.7 % (ref 40.0–52.0)
HEMOGLOBIN: 15.2 g/dL (ref 13.0–18.0)
MCH: 32.2 pg (ref 26.0–34.0)
MCHC: 34.9 g/dL (ref 32.0–36.0)
MCV: 92.4 fL (ref 80.0–100.0)
Platelets: 219 10*3/uL (ref 150–440)
RBC: 4.72 MIL/uL (ref 4.40–5.90)
RDW: 13.2 % (ref 11.5–14.5)
WBC: 6.4 10*3/uL (ref 3.8–10.6)

## 2016-01-18 LAB — BLOOD GAS, VENOUS
Acid-base deficit: 2.2 mmol/L — ABNORMAL HIGH (ref 0.0–2.0)
Bicarbonate: 25.5 mEq/L (ref 21.0–28.0)
PCO2 VEN: 53 mmHg (ref 44.0–60.0)
PH VEN: 7.29 — AB (ref 7.320–7.430)
Patient temperature: 37

## 2016-01-18 LAB — GLUCOSE, CAPILLARY: GLUCOSE-CAPILLARY: 247 mg/dL — AB (ref 65–99)

## 2016-01-18 LAB — LIPASE, BLOOD: Lipase: 22 U/L (ref 11–51)

## 2016-01-18 MED ORDER — ONDANSETRON 4 MG PO TBDP: 4.0000 mg | ORAL_TABLET | Freq: Four times a day (QID) | ORAL | 0 refills | Status: DC | PRN

## 2016-01-18 MED ORDER — SODIUM CHLORIDE 0.9 % IV BOLUS (SEPSIS)
1000.0000 mL | Freq: Once | INTRAVENOUS | Status: AC
  Administered 2016-01-18: 1000 mL via INTRAVENOUS
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
## 2016-01-18 NOTE — ED Triage Notes (Signed)
Pt reports apx 2 hr PTA with 3 episodes N/V and profound weakness. Reports BS 238. Known DM type I. Mother reports + ketones in urine via ketone strip at home. Brought in by EMS.

## 2016-03-05 ENCOUNTER — Ambulatory Visit (INDEPENDENT_AMBULATORY_CARE_PROVIDER_SITE_OTHER): Payer: Self-pay | Admitting: "Endocrinology

## 2016-03-08 ENCOUNTER — Ambulatory Visit: Payer: Medicare Other | Admitting: "Endocrinology

## 2016-03-29 ENCOUNTER — Other Ambulatory Visit: Payer: Self-pay | Admitting: "Endocrinology

## 2016-04-05 ENCOUNTER — Telehealth (INDEPENDENT_AMBULATORY_CARE_PROVIDER_SITE_OTHER): Payer: Self-pay | Admitting: "Endocrinology

## 2016-04-05 NOTE — Telephone Encounter (Signed)
Patient now has [employer] along with his Medicaid. Please send in rx for Humalog quick pens instead of Novolog as this is what [employer] prefers.

## 2016-04-07 ENCOUNTER — Other Ambulatory Visit (INDEPENDENT_AMBULATORY_CARE_PROVIDER_SITE_OTHER): Payer: Self-pay | Admitting: *Deleted

## 2016-04-07 NOTE — Telephone Encounter (Signed)
Patient is on 70/30, will need to discuss with Dr. Fransico MichaelBrennan the Humalog equivalent.

## 2016-04-09 NOTE — Telephone Encounter (Signed)
Sent to provider to change insulin

## 2016-04-14 ENCOUNTER — Other Ambulatory Visit (INDEPENDENT_AMBULATORY_CARE_PROVIDER_SITE_OTHER): Payer: Self-pay | Admitting: *Deleted

## 2016-04-14 DIAGNOSIS — E1065 Type 1 diabetes mellitus with hyperglycemia: Principal | ICD-10-CM

## 2016-04-14 DIAGNOSIS — IMO0001 Reserved for inherently not codable concepts without codable children: Secondary | ICD-10-CM

## 2016-04-14 MED ORDER — INSULIN LISPRO PROT & LISPRO (75-25 MIX) 100 UNIT/ML KWIKPEN: PEN_INJECTOR | SUBCUTANEOUS | 6 refills | Status: DC

## 2016-04-14 NOTE — Telephone Encounter (Signed)
Received message from Dr. Fransico MichaelBrennan,  Please send in a prescription for [name] for Humalog 74/25 Qwikpens. His doses are 34 units in the mornings and 20 units in the evenings. Thanks.  Dr. Fransico MichaelBrennan

## 2016-04-27 ENCOUNTER — Ambulatory Visit (INDEPENDENT_AMBULATORY_CARE_PROVIDER_SITE_OTHER): Payer: Medicare Other | Admitting: "Endocrinology

## 2016-06-01 ENCOUNTER — Ambulatory Visit (INDEPENDENT_AMBULATORY_CARE_PROVIDER_SITE_OTHER): Payer: Medicare Other | Admitting: "Endocrinology

## 2016-06-01 ENCOUNTER — Encounter (INDEPENDENT_AMBULATORY_CARE_PROVIDER_SITE_OTHER): Payer: Self-pay | Admitting: "Endocrinology

## 2016-06-01 VITALS — BP 114/76 | HR 80 | Ht 64.69 in | Wt 122.6 lb

## 2016-06-01 DIAGNOSIS — E063 Autoimmune thyroiditis: Secondary | ICD-10-CM

## 2016-06-01 DIAGNOSIS — E1042 Type 1 diabetes mellitus with diabetic polyneuropathy: Secondary | ICD-10-CM

## 2016-06-01 DIAGNOSIS — I10 Essential (primary) hypertension: Secondary | ICD-10-CM | POA: Diagnosis not present

## 2016-06-01 DIAGNOSIS — E1043 Type 1 diabetes mellitus with diabetic autonomic (poly)neuropathy: Secondary | ICD-10-CM | POA: Diagnosis not present

## 2016-06-01 DIAGNOSIS — E038 Other specified hypothyroidism: Secondary | ICD-10-CM | POA: Diagnosis not present

## 2016-06-01 DIAGNOSIS — E049 Nontoxic goiter, unspecified: Secondary | ICD-10-CM | POA: Diagnosis not present

## 2016-06-01 DIAGNOSIS — E109 Type 1 diabetes mellitus without complications: Secondary | ICD-10-CM | POA: Diagnosis not present

## 2016-06-01 DIAGNOSIS — E10649 Type 1 diabetes mellitus with hypoglycemia without coma: Secondary | ICD-10-CM | POA: Diagnosis not present

## 2016-06-01 DIAGNOSIS — R809 Proteinuria, unspecified: Secondary | ICD-10-CM

## 2016-06-01 DIAGNOSIS — E1065 Type 1 diabetes mellitus with hyperglycemia: Secondary | ICD-10-CM

## 2016-06-01 DIAGNOSIS — IMO0001 Reserved for inherently not codable concepts without codable children: Secondary | ICD-10-CM

## 2016-06-01 DIAGNOSIS — E7800 Pure hypercholesterolemia, unspecified: Secondary | ICD-10-CM

## 2016-06-01 LAB — POCT GLYCOSYLATED HEMOGLOBIN (HGB A1C)

## 2016-06-01 LAB — TSH: TSH: 2.65 mIU/L (ref 0.40–4.50)

## 2016-06-01 LAB — T4, FREE: Free T4: 1.2 ng/dL (ref 0.8–1.8)

## 2016-06-01 LAB — T3, FREE: T3 FREE: 2.7 pg/mL (ref 2.3–4.2)

## 2016-06-01 LAB — GLUCOSE, POCT (MANUAL RESULT ENTRY): POC GLUCOSE: 288 mg/dL — AB (ref 70–99)

## 2016-06-01 NOTE — Patient Instructions (Signed)
Follow up visit in two months.  
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
## 2016-06-02 ENCOUNTER — Encounter (INDEPENDENT_AMBULATORY_CARE_PROVIDER_SITE_OTHER): Payer: Self-pay

## 2016-06-02 LAB — LIPID PANEL
CHOLESTEROL: 202 mg/dL — AB (ref <200)
HDL: 54 mg/dL (ref >40)
LDL Cholesterol: 127 mg/dL — ABNORMAL HIGH (ref <100)
TRIGLYCERIDES: 106 mg/dL (ref <150)
Total CHOL/HDL Ratio: 3.7 Ratio (ref <5.0)
VLDL: 21 mg/dL (ref <30)

## 2016-06-02 LAB — COMPREHENSIVE METABOLIC PANEL
ALK PHOS: 99 U/L (ref 40–115)
ALT: 36 U/L (ref 9–46)
AST: 32 U/L (ref 10–40)
Albumin: 3.5 g/dL — ABNORMAL LOW (ref 3.6–5.1)
BUN: 15 mg/dL (ref 7–25)
CALCIUM: 9.1 mg/dL (ref 8.6–10.3)
CHLORIDE: 103 mmol/L (ref 98–110)
CO2: 31 mmol/L (ref 20–31)
Creat: 0.8 mg/dL (ref 0.60–1.35)
Glucose, Bld: 130 mg/dL — ABNORMAL HIGH (ref 70–99)
POTASSIUM: 4.4 mmol/L (ref 3.5–5.3)
Sodium: 140 mmol/L (ref 135–146)
TOTAL PROTEIN: 6.5 g/dL (ref 6.1–8.1)
Total Bilirubin: 0.3 mg/dL (ref 0.2–1.2)

## 2016-06-02 LAB — MICROALBUMIN / CREATININE URINE RATIO
CREATININE, URINE: 100 mg/dL (ref 20–370)
MICROALB UR: 15.9 mg/dL
Microalb Creat Ratio: 159 mcg/mg creat — ABNORMAL HIGH (ref <30)

## 2016-06-03 ENCOUNTER — Other Ambulatory Visit: Payer: Self-pay | Admitting: "Endocrinology

## 2016-06-03 DIAGNOSIS — IMO0001 Reserved for inherently not codable concepts without codable children: Secondary | ICD-10-CM

## 2016-06-03 DIAGNOSIS — E1065 Type 1 diabetes mellitus with hyperglycemia: Principal | ICD-10-CM

## 2016-08-02 ENCOUNTER — Ambulatory Visit (INDEPENDENT_AMBULATORY_CARE_PROVIDER_SITE_OTHER): Payer: Medicare Other | Admitting: "Endocrinology

## 2016-09-13 ENCOUNTER — Ambulatory Visit (INDEPENDENT_AMBULATORY_CARE_PROVIDER_SITE_OTHER): Payer: Medicare Other | Admitting: "Endocrinology

## 2016-09-29 ENCOUNTER — Ambulatory Visit (INDEPENDENT_AMBULATORY_CARE_PROVIDER_SITE_OTHER): Payer: Medicare Other | Admitting: "Endocrinology

## 2016-09-29 ENCOUNTER — Encounter (INDEPENDENT_AMBULATORY_CARE_PROVIDER_SITE_OTHER): Payer: Self-pay | Admitting: "Endocrinology

## 2016-09-29 VITALS — BP 120/88 | HR 100 | Ht 64.45 in | Wt 131.2 lb

## 2016-09-29 DIAGNOSIS — E860 Dehydration: Secondary | ICD-10-CM

## 2016-09-29 DIAGNOSIS — E10649 Type 1 diabetes mellitus with hypoglycemia without coma: Secondary | ICD-10-CM

## 2016-09-29 DIAGNOSIS — R140 Abdominal distension (gaseous): Secondary | ICD-10-CM

## 2016-09-29 DIAGNOSIS — E1042 Type 1 diabetes mellitus with diabetic polyneuropathy: Secondary | ICD-10-CM

## 2016-09-29 DIAGNOSIS — R634 Abnormal weight loss: Secondary | ICD-10-CM | POA: Diagnosis not present

## 2016-09-29 DIAGNOSIS — E782 Mixed hyperlipidemia: Secondary | ICD-10-CM

## 2016-09-29 DIAGNOSIS — IMO0001 Reserved for inherently not codable concepts without codable children: Secondary | ICD-10-CM

## 2016-09-29 DIAGNOSIS — E049 Nontoxic goiter, unspecified: Secondary | ICD-10-CM | POA: Diagnosis not present

## 2016-09-29 DIAGNOSIS — E063 Autoimmune thyroiditis: Secondary | ICD-10-CM | POA: Diagnosis not present

## 2016-09-29 DIAGNOSIS — I4711 Inappropriate sinus tachycardia, so stated: Secondary | ICD-10-CM

## 2016-09-29 DIAGNOSIS — R000 Tachycardia, unspecified: Secondary | ICD-10-CM | POA: Diagnosis not present

## 2016-09-29 DIAGNOSIS — E1065 Type 1 diabetes mellitus with hyperglycemia: Secondary | ICD-10-CM

## 2016-09-29 DIAGNOSIS — E1043 Type 1 diabetes mellitus with diabetic autonomic (poly)neuropathy: Secondary | ICD-10-CM

## 2016-09-29 LAB — POCT GLUCOSE (DEVICE FOR HOME USE): POC GLUCOSE: 70 mg/dL (ref 70–99)

## 2016-09-29 LAB — POCT GLYCOSYLATED HEMOGLOBIN (HGB A1C): Hemoglobin A1C: 10.2
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
## 2016-09-29 NOTE — Patient Instructions (Addendum)
Follow up visit in 3 months. Please reduce the Humalog 75/25 insulin doses to 32 units in the mornings and to 27 units in the evenings. Call Dr. Fransico Ravonda Brecheen in two weeks on a Wednesday or Sunday evening between 8:00-9:30 PM. [place] if having more low BGs.

## 2016-11-22 ENCOUNTER — Other Ambulatory Visit: Payer: Self-pay | Admitting: "Endocrinology

## 2016-12-19 ENCOUNTER — Other Ambulatory Visit (INDEPENDENT_AMBULATORY_CARE_PROVIDER_SITE_OTHER): Payer: Self-pay | Admitting: "Endocrinology

## 2016-12-19 DIAGNOSIS — E1065 Type 1 diabetes mellitus with hyperglycemia: Principal | ICD-10-CM

## 2016-12-19 DIAGNOSIS — IMO0001 Reserved for inherently not codable concepts without codable children: Secondary | ICD-10-CM

## 2016-12-30 ENCOUNTER — Ambulatory Visit (INDEPENDENT_AMBULATORY_CARE_PROVIDER_SITE_OTHER): Payer: Medicare Other | Admitting: "Endocrinology

## 2016-12-30 ENCOUNTER — Encounter (INDEPENDENT_AMBULATORY_CARE_PROVIDER_SITE_OTHER): Payer: Self-pay | Admitting: "Endocrinology

## 2016-12-30 VITALS — BP 112/60 | HR 76 | Wt 127.6 lb

## 2016-12-30 DIAGNOSIS — E1042 Type 1 diabetes mellitus with diabetic polyneuropathy: Secondary | ICD-10-CM | POA: Diagnosis not present

## 2016-12-30 DIAGNOSIS — T7401XD Adult neglect or abandonment, confirmed, subsequent encounter: Secondary | ICD-10-CM

## 2016-12-30 DIAGNOSIS — Z91199 Patient's noncompliance with other medical treatment and regimen due to unspecified reason: Secondary | ICD-10-CM

## 2016-12-30 DIAGNOSIS — R634 Abnormal weight loss: Secondary | ICD-10-CM

## 2016-12-30 DIAGNOSIS — E049 Nontoxic goiter, unspecified: Secondary | ICD-10-CM | POA: Diagnosis not present

## 2016-12-30 DIAGNOSIS — E11649 Type 2 diabetes mellitus with hypoglycemia without coma: Secondary | ICD-10-CM | POA: Diagnosis not present

## 2016-12-30 DIAGNOSIS — E860 Dehydration: Secondary | ICD-10-CM | POA: Diagnosis not present

## 2016-12-30 DIAGNOSIS — E1065 Type 1 diabetes mellitus with hyperglycemia: Secondary | ICD-10-CM | POA: Diagnosis not present

## 2016-12-30 DIAGNOSIS — E063 Autoimmune thyroiditis: Secondary | ICD-10-CM | POA: Diagnosis not present

## 2016-12-30 DIAGNOSIS — IMO0001 Reserved for inherently not codable concepts without codable children: Secondary | ICD-10-CM

## 2016-12-30 DIAGNOSIS — Z9119 Patient's noncompliance with other medical treatment and regimen: Secondary | ICD-10-CM

## 2016-12-30 DIAGNOSIS — I10 Essential (primary) hypertension: Secondary | ICD-10-CM

## 2016-12-30 LAB — POCT GLYCOSYLATED HEMOGLOBIN (HGB A1C): HEMOGLOBIN A1C: 11.7

## 2016-12-30 LAB — POCT GLUCOSE (DEVICE FOR HOME USE): GLUCOSE FASTING, POC: 145 mg/dL — AB (ref 70–99)

## 2016-12-30 NOTE — Patient Instructions (Signed)
Follow up visit in 4 months.  
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
## 2017-01-05 DIAGNOSIS — E109 Type 1 diabetes mellitus without complications: Secondary | ICD-10-CM | POA: Diagnosis not present

## 2017-01-05 DIAGNOSIS — H3552 Pigmentary retinal dystrophy: Secondary | ICD-10-CM | POA: Diagnosis not present

## 2017-01-05 DIAGNOSIS — H40033 Anatomical narrow angle, bilateral: Secondary | ICD-10-CM | POA: Diagnosis not present

## 2017-01-05 DIAGNOSIS — H25813 Combined forms of age-related cataract, bilateral: Secondary | ICD-10-CM | POA: Diagnosis not present

## 2017-01-05 DIAGNOSIS — H25042 Posterior subcapsular polar age-related cataract, left eye: Secondary | ICD-10-CM | POA: Diagnosis not present

## 2017-01-14 ENCOUNTER — Other Ambulatory Visit (INDEPENDENT_AMBULATORY_CARE_PROVIDER_SITE_OTHER): Payer: Self-pay | Admitting: "Endocrinology

## 2017-04-27 ENCOUNTER — Other Ambulatory Visit (INDEPENDENT_AMBULATORY_CARE_PROVIDER_SITE_OTHER): Payer: Self-pay | Admitting: "Endocrinology

## 2017-04-27 DIAGNOSIS — IMO0001 Reserved for inherently not codable concepts without codable children: Secondary | ICD-10-CM

## 2017-04-27 DIAGNOSIS — E1065 Type 1 diabetes mellitus with hyperglycemia: Principal | ICD-10-CM

## 2017-05-02 ENCOUNTER — Ambulatory Visit (INDEPENDENT_AMBULATORY_CARE_PROVIDER_SITE_OTHER): Payer: Medicare Other | Admitting: "Endocrinology

## 2017-06-08 ENCOUNTER — Other Ambulatory Visit (INDEPENDENT_AMBULATORY_CARE_PROVIDER_SITE_OTHER): Payer: Self-pay | Admitting: *Deleted

## 2017-06-08 DIAGNOSIS — E1065 Type 1 diabetes mellitus with hyperglycemia: Principal | ICD-10-CM

## 2017-06-08 DIAGNOSIS — IMO0001 Reserved for inherently not codable concepts without codable children: Secondary | ICD-10-CM

## 2017-06-08 MED ORDER — GLUCOSE BLOOD VI STRP: ORAL_STRIP | 3 refills | Status: DC

## 2017-06-27 ENCOUNTER — Ambulatory Visit (INDEPENDENT_AMBULATORY_CARE_PROVIDER_SITE_OTHER): Payer: Medicare Other | Admitting: "Endocrinology

## 2017-06-27 ENCOUNTER — Encounter (INDEPENDENT_AMBULATORY_CARE_PROVIDER_SITE_OTHER): Payer: Self-pay | Admitting: "Endocrinology

## 2017-06-27 VITALS — BP 124/70 | HR 88 | Ht 64.25 in | Wt 132.6 lb

## 2017-06-27 DIAGNOSIS — R140 Abdominal distension (gaseous): Secondary | ICD-10-CM | POA: Diagnosis not present

## 2017-06-27 DIAGNOSIS — E10649 Type 1 diabetes mellitus with hypoglycemia without coma: Secondary | ICD-10-CM | POA: Diagnosis not present

## 2017-06-27 DIAGNOSIS — R000 Tachycardia, unspecified: Secondary | ICD-10-CM | POA: Diagnosis not present

## 2017-06-27 DIAGNOSIS — E049 Nontoxic goiter, unspecified: Secondary | ICD-10-CM | POA: Diagnosis not present

## 2017-06-27 DIAGNOSIS — T7401XD Adult neglect or abandonment, confirmed, subsequent encounter: Secondary | ICD-10-CM | POA: Diagnosis not present

## 2017-06-27 DIAGNOSIS — E1043 Type 1 diabetes mellitus with diabetic autonomic (poly)neuropathy: Secondary | ICD-10-CM | POA: Diagnosis not present

## 2017-06-27 DIAGNOSIS — I10 Essential (primary) hypertension: Secondary | ICD-10-CM | POA: Diagnosis not present

## 2017-06-27 DIAGNOSIS — K5909 Other constipation: Secondary | ICD-10-CM

## 2017-06-27 DIAGNOSIS — E1065 Type 1 diabetes mellitus with hyperglycemia: Secondary | ICD-10-CM

## 2017-06-27 DIAGNOSIS — E1042 Type 1 diabetes mellitus with diabetic polyneuropathy: Secondary | ICD-10-CM

## 2017-06-27 DIAGNOSIS — IMO0001 Reserved for inherently not codable concepts without codable children: Secondary | ICD-10-CM

## 2017-06-27 DIAGNOSIS — E063 Autoimmune thyroiditis: Secondary | ICD-10-CM

## 2017-06-27 DIAGNOSIS — E1029 Type 1 diabetes mellitus with other diabetic kidney complication: Secondary | ICD-10-CM | POA: Diagnosis not present

## 2017-06-27 DIAGNOSIS — E1151 Type 2 diabetes mellitus with diabetic peripheral angiopathy without gangrene: Secondary | ICD-10-CM

## 2017-06-27 DIAGNOSIS — R809 Proteinuria, unspecified: Secondary | ICD-10-CM

## 2017-06-27 LAB — POCT GLUCOSE (DEVICE FOR HOME USE): POC Glucose: 134 mg/dl — AB (ref 70–99)

## 2017-06-27 LAB — POCT GLYCOSYLATED HEMOGLOBIN (HGB A1C): Hemoglobin A1C: 9

## 2017-06-27 NOTE — Patient Instructions (Addendum)
Follow up visit in 3 months. Please continue the Humalog 75/25 Mix insulin doses of 32 units in the mornings and 27 units in the evenings. However, if BG is less than 100 at breakfast, give only 27 units. If BG is less than 100 at dinner, give only 23 units. If the BG is less than 100 at breakfast and dinner, eat first before giving insulin. Call Dr Fransico Tupac Jeffus in early March on a Wednesday or Sunday evening between 8:00-9:30 PM to discuss BGs .
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
## 2017-06-28 LAB — LIPID PANEL
CHOL/HDL RATIO: 2.5 (calc) (ref <5.0)
Cholesterol: 134 mg/dL (ref <200)
HDL: 53 mg/dL (ref >40)
LDL CHOLESTEROL (CALC): 65 mg/dL
NON-HDL CHOLESTEROL (CALC): 81 mg/dL (ref <130)
TRIGLYCERIDES: 79 mg/dL (ref <150)

## 2017-06-28 LAB — TSH: TSH: 1.49 mIU/L (ref 0.40–4.50)

## 2017-06-28 LAB — COMPREHENSIVE METABOLIC PANEL
AG Ratio: 1.4 (calc) (ref 1.0–2.5)
ALBUMIN MSPROF: 3.8 g/dL (ref 3.6–5.1)
ALT: 10 U/L (ref 9–46)
AST: 16 U/L (ref 10–40)
Alkaline phosphatase (APISO): 74 U/L (ref 40–115)
BUN: 17 mg/dL (ref 7–25)
CALCIUM: 9.3 mg/dL (ref 8.6–10.3)
CO2: 28 mmol/L (ref 20–32)
CREATININE: 0.84 mg/dL (ref 0.60–1.35)
Chloride: 105 mmol/L (ref 98–110)
GLUCOSE: 213 mg/dL — AB (ref 65–139)
Globulin: 2.8 g/dL (calc) (ref 1.9–3.7)
POTASSIUM: 4.8 mmol/L (ref 3.5–5.3)
SODIUM: 139 mmol/L (ref 135–146)
TOTAL PROTEIN: 6.6 g/dL (ref 6.1–8.1)
Total Bilirubin: 0.5 mg/dL (ref 0.2–1.2)

## 2017-06-28 LAB — MICROALBUMIN / CREATININE URINE RATIO
Creatinine, Urine: 222 mg/dL (ref 20–320)
Microalb Creat Ratio: 748 mcg/mg creat — ABNORMAL HIGH (ref <30)
Microalb, Ur: 166 mg/dL

## 2017-06-28 LAB — T4, FREE: FREE T4: 1.2 ng/dL (ref 0.8–1.8)

## 2017-06-28 LAB — T3, FREE: T3 FREE: 3.1 pg/mL (ref 2.3–4.2)

## 2017-07-04 ENCOUNTER — Encounter (INDEPENDENT_AMBULATORY_CARE_PROVIDER_SITE_OTHER): Payer: Self-pay | Admitting: *Deleted

## 2017-08-22 ENCOUNTER — Telehealth (INDEPENDENT_AMBULATORY_CARE_PROVIDER_SITE_OTHER): Payer: Self-pay | Admitting: "Endocrinology

## 2017-08-22 NOTE — Telephone Encounter (Signed)
error 

## 2017-09-26 ENCOUNTER — Other Ambulatory Visit (INDEPENDENT_AMBULATORY_CARE_PROVIDER_SITE_OTHER): Payer: Self-pay | Admitting: *Deleted

## 2017-09-26 ENCOUNTER — Encounter (INDEPENDENT_AMBULATORY_CARE_PROVIDER_SITE_OTHER): Payer: Self-pay | Admitting: "Endocrinology

## 2017-09-26 ENCOUNTER — Ambulatory Visit (INDEPENDENT_AMBULATORY_CARE_PROVIDER_SITE_OTHER): Payer: Medicare Other | Admitting: "Endocrinology

## 2017-09-26 ENCOUNTER — Other Ambulatory Visit (INDEPENDENT_AMBULATORY_CARE_PROVIDER_SITE_OTHER): Payer: Self-pay | Admitting: "Endocrinology

## 2017-09-26 VITALS — BP 128/70 | HR 100 | Ht 64.57 in | Wt 136.2 lb

## 2017-09-26 DIAGNOSIS — R000 Tachycardia, unspecified: Secondary | ICD-10-CM

## 2017-09-26 DIAGNOSIS — E1151 Type 2 diabetes mellitus with diabetic peripheral angiopathy without gangrene: Secondary | ICD-10-CM

## 2017-09-26 DIAGNOSIS — E1065 Type 1 diabetes mellitus with hyperglycemia: Secondary | ICD-10-CM | POA: Diagnosis not present

## 2017-09-26 DIAGNOSIS — R140 Abdominal distension (gaseous): Secondary | ICD-10-CM | POA: Diagnosis not present

## 2017-09-26 DIAGNOSIS — E063 Autoimmune thyroiditis: Secondary | ICD-10-CM

## 2017-09-26 DIAGNOSIS — IMO0001 Reserved for inherently not codable concepts without codable children: Secondary | ICD-10-CM

## 2017-09-26 DIAGNOSIS — E1029 Type 1 diabetes mellitus with other diabetic kidney complication: Secondary | ICD-10-CM | POA: Diagnosis not present

## 2017-09-26 DIAGNOSIS — E049 Nontoxic goiter, unspecified: Secondary | ICD-10-CM

## 2017-09-26 DIAGNOSIS — E1043 Type 1 diabetes mellitus with diabetic autonomic (poly)neuropathy: Secondary | ICD-10-CM | POA: Diagnosis not present

## 2017-09-26 DIAGNOSIS — E1042 Type 1 diabetes mellitus with diabetic polyneuropathy: Secondary | ICD-10-CM | POA: Diagnosis not present

## 2017-09-26 DIAGNOSIS — E782 Mixed hyperlipidemia: Secondary | ICD-10-CM

## 2017-09-26 DIAGNOSIS — R809 Proteinuria, unspecified: Secondary | ICD-10-CM

## 2017-09-26 DIAGNOSIS — E10649 Type 1 diabetes mellitus with hypoglycemia without coma: Secondary | ICD-10-CM | POA: Diagnosis not present

## 2017-09-26 LAB — POCT URINALYSIS DIPSTICK
GLUCOSE UA: 2000
Ketones, UA: NEGATIVE

## 2017-09-26 LAB — POCT GLUCOSE (DEVICE FOR HOME USE): POC Glucose: 364 mg/dl — AB (ref 70–99)

## 2017-09-26 LAB — POCT GLYCOSYLATED HEMOGLOBIN (HGB A1C): Hemoglobin A1C: 10.9

## 2017-09-26 MED ORDER — GLUCOSE BLOOD VI STRP: ORAL_STRIP | 5 refills | Status: DC

## 2017-09-26 NOTE — Patient Instructions (Addendum)
Follow up visit in 2 months. Please ensure that [name] attends that visit. Change Humalog Mix 75/25 doses to 32 units in the mornings and 27 units in the evenings. If the BGs at breakfast/brunch are less than 100, take only 27 units of insulin. If the BGs at dinner are less than 100, take only 24 units of insulin. Please call Dr. Fransico Michael on Sunday evening, 10/16/17 between 8:00-9:30 PM to discuss BGs.

## 2017-09-26 NOTE — Progress Notes (Signed)
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  HISTORY OF PRESENT ILLNESS:   [name] is a 32 y.o. Caucasian young man.  [name] was accompanied by his mother.     1. I first consulted on [name] when he was admitted to the Pediatric Ward at the [facility] in May 2007.  A. The patient was diagnosed with T1DM at age 14 in about 2001 or 2002. We have few details about his medical care until 2007. We know that in about 2006 he saw a pediatric endocrinologist at [facility], [facility], [facility]. Unfortunately, the family chose not to return to see the endocrinologist. In about April of 2007, his primary care provider, Dr. Reed Breech of [facility], started the patient on an insulin pump. Unfortunately, neither the patient nor the family were capable of utilizing the pump's technology effectively.   B. On 10/02/2005 the patient was admitted to the [facility] for poorly controlled type 1 diabetes. I consulted on the patient at that time. 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 He did not have the insight or intelligence to perform the daily tasks of diabetes self-care independently or to utilize the insulin pump. It also appeared that there was not a great deal of adult supervision and support within his family. We tried to teach him and his family how to follow a multiple daily injection of insulin plan with Lantus and Novolog insulin, but that attempt failed rapidly. I changed his regimen to  Novolog Mix 70/30 insulin taken twice daily. He was to take 26 units each morning at breakfast and 14 units each evening at supper. I also gave him a sliding scale for Humalog lispro with different doses of insulin to be used at breakfast, lunch, supper, and bedtime in addition to his 70/30 insulin when his family members were available to check the blood sugars and give the additional insulin. The patient's family members agreed to supervise his care.  2. Unfortunately, the patient has frequently not had the adult supervision and support that he needed. In the last 12 years, the patient has been scheduled to return to our clinic for follow up visits approximately every 3 months, but has actually only returned to our clinic for follow up visits 1-3 times per year. In 2012 he had 3 visits and was admitted once. In 2013 he had one admission in December, followed by a clinic visit later that month. In 2014 he had 4 clinic visits. In 2015 he had two clinic visits. In fairness, however, I should note that I was unavailable to see him for two months in 2015 due to my severe leg injury and post-operative recuperation. He had 4 visits in 2016 and 4 visits in 2017, but in 2018 he only had 3 visits. Today is his second visit in 2019.  3. During the past 12 years he has had multiple medical problems:  A. T1DM: His hemoglobin A1c values have been mostly in the 9.0-14.0% range. He has the following complications of diabetes: diabetic angiopathy, peripheral neuropathy, autonomic neuropathy, inappropriate sinus tachycardia, gastroparesis and postprandial abdominal bloating, chronic constipation, urinary microalbuminuria, and occasional but significant  hypoglycemia.   B. Autoimmune hypothyroidism: In November 2007, the patient was diagnosed with hypothyroidism secondary to Hashimoto's disease and was started on Synthroid at a dose of 25 mcg per day.   C. Hypertension: In November 2008, he was diagnosed with hypertension and  started on lisinopril, 5 mg per day.   D. Hypercholesterolemia: In July 2014 he was diagnosed with hypercholesterolemia and started on atorvastatin, 10 mg/day.  E. Noncompliance/medical neglect: Unfortunately, he frequently missed BG checks, doses of insulin, and doses of his oral medications. Parental/family supervision has varied in intensity and in quality throughout these years.   4. [name]' last [facility] clinic visit was on 06/27/17.  At that visit we continued his Humalog Mix 75/25 insulin doses of 32 units in the mornings and 27 units in the evenings. Unfortunately, on his own he changed his doses to 35 units in the mornings and 20 units in the evenings, a net daily decrease in insulin of 4 units.    A. In the interim he has been healthy. His dentures are working well.     B. BG control has been worse. [name] has been checking his BGs more often and taking his insulin more often, but he has also been eating a lot more and not exercising as much. He says that he has recently been walking more and riding his bike more . He says that he is not snacking as much. Mom disagrees.  [name]' sister, [name], now supervises his care in her home. [name] listens to her more about taking his insulin.   D. He now has both Medicaid and Medicare coverage, however, [place] Medicaid will no longer pay for medications. He now receives his insulins and DM supplies through [facility].    E. Providing adequate adult supervision for [name] is reportedly going better.  However, there has obviously been a disconnect between the doses of insulin he was supposed to be on and the doses he has been receiving.   F.  He is not doing as much itching and scratching.   G. He is supposed to be taking 25 mcg of Synthroid daily, 10 mg of lisinopril daily, and 10 mg of atorvastatin daily. He thinks that he does not miss many doses. Mom has not discussed this issue with [name] prior to coming to today's visit.  5. Pertinent Review of  Systems:  Constitutional: The patient feels "good overall". His irritability, depression, and willingness to cooperate with his DM care plan still vary, but in general he has been fairly cooperative since moving in with his sister and her family. However, there have been some interpersonal stresses at his sister's home. When he becomes hypoglycemic or extremely hyperglycemic, he can still be very irritable and sometimes even combative.  Eyes: Vision is a little better now with his glasses. He is legally blind. He had his annual eye exam in late Autumn 2018.  Cataracts were seen, but did not require surgery.  Neck: The patient has no complaints of anterior neck swelling, soreness, tenderness,  pressure, discomfort, or difficulty swallowing.  Heart: The patient has no complaints of palpitations, irregular heat beats, chest pain, or chest pressure. Gastrointestinal: He is no longer constipated and does not have bloating after meals. The patient has no other complaints of excessive hunger, acid reflux, stomach aches or pains, diarrhea, or constipation. Legs: As above. Muscle mass and strength seem normal. There are no complaints of numbness, tingling, burning, or pain. No edema is noted. Feet: [name] admits  that he is still picking at his toe nails. There are no recent complaints of numbness, tingling, burning, or pain. No edema is noted. GU: He rarely has nocturia. Hypoglycemia: He has not had many low BGs in the past month, but he did have low BG symptoms at about 3 AM this morning. Since he had run out of test strips, however, he could not check his BG.  .    6. BG meter review: He checks BGs from 2-6 times daily, average 3.6 times per day. He has pretty consistently been checking BGs at his first meal of the day and again when he has dinner about 8 PM. Morning BGs have varied between 157 and 491. Dinner BGs have varied between 64-High.  His average BG was 323, compared with 255 at his last visit and with  374 at the prior visit. He had 30 BGs >400, three of which were in the 500s, and three of which were High. Two of the three High BGs were re-checks of BGs >500. He had 4 separate BGs <70: one 50, one 55, and two 64s. All of the low BGs occurred in the afternoon and evening.    PAST MEDICAL, FAMILY, AND SOCIAL HISTORY:  Past Medical History:  Diagnosis Date  . Angiopathy, diabetic (HCC)   . Diabetes mellitus    Type 1, diagnosed age 37, with frequent DKA admissions, difficult to control due to MR  . Diabetic nephropathy (HCC)    Started on Lisinopril 5 mg  . Diabetic peripheral neuropathy (HCC)   . Dysmorphic features   . Goiter   . Hypertension   . Hypoglycemia associated with diabetes (HCC)   . Hypothyroidism   . Impaired cognition    mention of mental retardation  . Mental retardation   . Moderate or severe vision impairment, both eyes, impairment level not further specified   . 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 Thyroiditis, autoimmune     Family History  Problem Relation Age of Onset  . Vision loss Father   . Retinitis pigmentosa Mother   . Diabetes Maternal Grandmother        AODM  . Diabetes Paternal Grandmother        AODM  . Diabetes Cousin        Second cousin has juvenile-onset DM.     Current Outpatient Medications:  .  atorvastatin (LIPITOR) 10 MG tablet, TAKE 1 TABLET (10 MG TOTAL) BY MOUTH DAILY., Disp: 90 tablet, Rfl: 0 .  Insulin Lispro Prot & Lispro (HUMALOG MIX 75/25 KWIKPEN) (75-25) 100 UNIT/ML Kwikpen, INJECT 34 UNITS OF INSULIN IN THE MORNING AND 20 UNITS IN THE EVENING, Disp: 5 pen, Rfl: 5 .  lisinopril (PRINIVIL,ZESTRIL) 10 MG tablet, Take 10 mg by mouth daily., Disp: , Rfl:  .  SYNTHROID 25 MCG tablet, TAKE 1 TABLET (25 MCG TOTAL) BY MOUTH DAILY BEFORE BREAKFAST., Disp: 90 tablet, Rfl: 1 .  glucose blood (ONETOUCH VERIO) test strip, USE TO CHECK BLOOD SUGAR 6 TIMES DAILY E10.65, Disp: 200 each, Rfl: 5  Allergies as of  09/26/2017  . (No Known Allergies)    1. Work and Family:  [name] remains unemployed. He is still living with his sister, [name] and her family. [name] stays at home and so can look after [name]. [name]' friend, Gerlene Burdock, also lives in the house. [name] and Richard help each other out. Mom has gone back to work.  2. Activities: He has been walking  more and riding his bike more recently.  3. Smoking, alcohol, or drugs: No tobacco or alcohol or drugs.  4. Primary Care Provider: None at present  REVIEW OF SYSTEMS: There are no other significant problems involving Bobbyjoe's other body systems.   Objective:  Vital Signs:  BP 128/70   Pulse 100   Ht 5' 4.57" (1.64 m)   Wt 136 lb 3.2 oz (61.8 kg)   BMI 22.97 kg/m    Ht Readings from Last 3 Encounters:  09/26/17 5' 4.57" (1.64 m)  06/27/17 5' 4.25" (1.632 m)  09/29/16 5' 4.45" (1.637 m)   Wt Readings from Last 3 Encounters:  09/26/17 136 lb 3.2 oz (61.8 kg)  06/27/17 132 lb 9.6 oz (60.1 kg)  12/30/16 127 lb 9.6 oz (57.9 kg)   Body surface area is 1.68 meters squared.  Facility age limit for growth percentiles is 20 years. Facility age limit for growth percentiles is 20 years.  PHYSICAL EXAM:  Constitutional: The patient appears healthy, but his gait is wide-based and shuffling due to his visual handicap.  His weight has increased by 3.75 pounds. He was more engaged and interactive today. He still does not really remember much about checking his BGs and taking his insulins. His thought processes are still very concrete. His insight remains poor. He really does not understand or comprehend much about diabetes, about why he needs to take care of himself, and about what will happen to him if he does not take care of himself. He resents having to limit what he eats and to having to deal with the demands of having T1DM.Marland Kitchen  Face: The face is somewhat dysmorphic.  Eyes: There is no obvious arcus or proptosis. His eyes are slightly dry.    Mouth: The oropharynx appears normal. His geographic tongue has resolved. He no longer has any maxillary teeth, but he is wearing his new dentures. His mouth is somewhat dry.  Neck: The neck appears to be visibly normal. No carotid bruits are noted. The thyroid gland has shrunk back to normal size today. Thee consistency of the thyroid gland is normal today. The thyroid gland is not tender to palpation. Lungs: The lungs are clear to auscultation. Air movement is good. Heart: Heart rate and rhythm are regular. Heart sounds S1 and S2 are normal. I did not appreciate any pathologic cardiac murmurs. Abdomen: The abdomen is normal in size. Bowel sounds are normal. There is no obvious hepatomegaly, splenomegaly, or other mass effect.  Arms: Muscle size and bulk are normal for age. He has no new areas of excoriation on his arms. The older areas are healing or have healed.  Hands: There is no obvious tremor. Phalangeal and metacarpophalangeal joints are normal. Palmar muscles are normal. Palmar skin is normal. Palmar moisture is also normal. Legs: Muscles appear normal for age. No edema is present. Feet: Feet are normally formed. Dorsalis pedal pulses are faint 1+ bilaterally. PT pulses are faint 1+ bilaterally. Toe nails are normal, except for the left great toe that is somewhat ingrown, but is not infected.  Neurologic: Strength is normal for age in both the upper and lower extremities. Muscle tone is normal. Sensation to touch is normal in both the legs and feet.     LAB DATA:   Labs 09/26/17: HbA1c 10.9%, CBG 364, urine glucose 2000, urine ketones negative  Labs 06/27/17: HbA1c 9.0%, CBG 134; TSH 1.49, free T4 1.2, free T3 3.1; CMP normal, except for glucose of 213; cholesterol 134, triglycerides  79, HDL 53, LDL 65; urinary microalbumin/creatinine ratio was 748   Labs 12/31/15: HbA1c 11.7%, CBG 145  Labs 09/29/16: HbA1c 10.2%, CBG 70  Labs 06/01/16: HbA1c >14%. CBG 288; TSH 2.65, free T4 1.2, free T3  2.7; cholesterol 201, triglycerides 106, HDL 54, LDL 127; CMP normal except glucose 130 and albumin 3.5.   Labs 01/05/16: HbA1c 11.0%  Labs 11/04/15: HbA1c 10.5%  Labs 09/12/15: HbA1c 10.3%; CMP normal except for glucose 149 and sodium 133; cholesterol 190, triglycerides 90, HDL 55, LDL 117; urinary microalbumin/creatinine ratio 190; TSH 3.09, free T4 1.3, free T3 3.8  Labs 07/16/15: HbA1c 10.6%, Urine dipstick shows trace ketones  Labs 04/09/15: HbA1c 10.4%  Labs 09/09/14: HbA1c 11.9%; TSH 1.822, free T4 1.02, and free T3 2.6; CMP normal except for glucose of 273; cholesterol 174, triglycerides 181, HDL 46, LDL 92; urinary microalbumin/creatinine ratio 139.2  Labs 06/19/14: HbA1c 10.7%  Labs 05/12/14: CMP normal  Labs 02/19/14: HbA1c 10.2%  Labs 07/31/13: HbA1c 8.5%; CMP normal, except glucose 269; microalbumin/creatinine ratio 51.6; TSH 1.885, free T4 1.31, free T3 3.4   Labs 7/018/14: CMP normal, except glucose of 14; cholesterol 202, triglycerides 85, HDL 58, LDL 127; urinary microalbumin/creatinine ratio 51.9; TSH 2.993, free T4 1.32, free T3 2.8  05/10/12: Creatinine 0.62, TSH 1.025,            Assessment and Plan:   ASSESSMENT:  1-2. Diabetes mellitus/hypoglycemia:   A. The patient's BGs and HbA1c are much higher today, indicating that his overall BG control is much worse. Although he is checking BGs much more often and presumably taking his insulins, there are at least three factors adversely affecting his BGs: One, he changed his insulin doses, inadvertently reducing his overall daily total insulin amount. Two, he is eating many more carbs. Three, he is not exercising as much as he had been doing.    B. He has had 4 BGs <70 this month. All have appeared in the afternoons and evenings. The cause of these BGs seems to be the increase of his morning insulin dose from 32 to 35 units.  [name] needs continuing adult supervision. [name] needs to accompany him at his next visit, because  she is the only person who really knows what is going on with [name].  3. Hypertension: Blood pressure is normal today.  4. Peripheral neuropathy: This problem had improved and is not evident today, but will certainly recur if he does not get his BGs under better control.   5. Angiopathy: His angiopathy is still fairly severe and is not improving. If he exercises on a regular basis and controls his BGs, however, blood flow in his legs and feet will likely improve. 6. Goiter: Thyroid gland has shrunk back to normal size today. The process of waxing and waning of thyroid gland size and thyroid lobe size is c/w evolving Hashimoto's disease.  7. Hypothyroid: He was mid-range euthyroid in April 2016, but borderline low in April 2017. His TSH in January 2018 was better, but still above the goal range of 1.0-2.0. His TSH in February 2019 was within the goal range. He needs to take his Synthroid daily.  8. Autonomic neuropathy, tachycardia, bloating, and gastroparesis: His heart rate is much higher, c/w him having much higher BGs. His abdominal bloating and constipation have also resolved. The previous improvement in his autonomic neuropathy paralleled his improvement in BGs. His worsening of autonomic neuropathy parallels his worsening of BGs.  9. Combined hyperlipidemia: His lipids in February 2019  were normal on his current dose of atorvastatin.  10. Weight loss, unintentional: This process had resolved with him taking more insulin and much more food, but can recur when he is not as compliant with his DM care plan.    11. Dehydration: He is not dehydrated today.  12. Medical neglect: [name]' sister and her husband have been trying to supervise [name], although he often makes it difficult for them to do so.  13. 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  14. Microalbuminuria: His microalbumin/creatinine ratio in February 2019 was the highest that it has been in the past four nears. He needs better control of  both his BGs and his BPs. He still needs to take his lisinopril every day.    PLAN:  1. Diagnostic: HbA1c and CBG today. Reviewed BG printout. Call Dr. Fransico Darshana Curnutt in 2 weeks on Sunday evening May 26th between 8:00-9:30 PM to discuss BGs.  2. Therapeutic: Resume the Humalog Mix 75/25 insulin doses of 32 units in the mornings and 27 units in the evenings. If BGs are less than 100 at breakfast, reduce the insulin dose to 27 units. If BGs are <100 at dinner, reduce the insulin dose to 24 units. Take oral medications once a day, at whatever time the family can fit in the doses. Once again, I've asked mother to ensure that the family members actively supervise his insulin doses and BG checks.   3. Patient education: We discussed all of the above. I asked [name] again to please cooperate with his family members. 4. Follow-up: 2 months. Ensure that [name] can attend.   Level of Service: This visit lasted in excess of 65 minutes. More than 50% of the visit was devoted to counseling.  Molli Knock, MD 09/26/2017 10:27 AM

## 2017-10-19 ENCOUNTER — Other Ambulatory Visit (INDEPENDENT_AMBULATORY_CARE_PROVIDER_SITE_OTHER): Payer: Self-pay | Admitting: "Endocrinology

## 2017-10-19 DIAGNOSIS — IMO0001 Reserved for inherently not codable concepts without codable children: Secondary | ICD-10-CM

## 2017-10-19 DIAGNOSIS — E1065 Type 1 diabetes mellitus with hyperglycemia: Principal | ICD-10-CM

## 2017-11-23 ENCOUNTER — Ambulatory Visit (INDEPENDENT_AMBULATORY_CARE_PROVIDER_SITE_OTHER): Payer: Medicare Other | Admitting: "Endocrinology

## 2017-11-23 ENCOUNTER — Encounter (INDEPENDENT_AMBULATORY_CARE_PROVIDER_SITE_OTHER): Payer: Self-pay | Admitting: "Endocrinology

## 2017-11-23 VITALS — BP 120/66 | HR 80 | Wt 133.4 lb

## 2017-11-23 DIAGNOSIS — E063 Autoimmune thyroiditis: Secondary | ICD-10-CM | POA: Diagnosis not present

## 2017-11-23 DIAGNOSIS — E1065 Type 1 diabetes mellitus with hyperglycemia: Secondary | ICD-10-CM

## 2017-11-23 DIAGNOSIS — E049 Nontoxic goiter, unspecified: Secondary | ICD-10-CM

## 2017-11-23 DIAGNOSIS — R000 Tachycardia, unspecified: Secondary | ICD-10-CM

## 2017-11-23 DIAGNOSIS — E1042 Type 1 diabetes mellitus with diabetic polyneuropathy: Secondary | ICD-10-CM

## 2017-11-23 DIAGNOSIS — I4711 Inappropriate sinus tachycardia, so stated: Secondary | ICD-10-CM

## 2017-11-23 DIAGNOSIS — IMO0001 Reserved for inherently not codable concepts without codable children: Secondary | ICD-10-CM

## 2017-11-23 DIAGNOSIS — R809 Proteinuria, unspecified: Secondary | ICD-10-CM

## 2017-11-23 DIAGNOSIS — E1029 Type 1 diabetes mellitus with other diabetic kidney complication: Secondary | ICD-10-CM | POA: Diagnosis not present

## 2017-11-23 DIAGNOSIS — E10649 Type 1 diabetes mellitus with hypoglycemia without coma: Secondary | ICD-10-CM

## 2017-11-23 DIAGNOSIS — E1043 Type 1 diabetes mellitus with diabetic autonomic (poly)neuropathy: Secondary | ICD-10-CM

## 2017-11-23 DIAGNOSIS — I10 Essential (primary) hypertension: Secondary | ICD-10-CM | POA: Diagnosis not present

## 2017-11-23 LAB — POCT GLUCOSE (DEVICE FOR HOME USE): POC Glucose: 338 mg/dl — AB (ref 70–99)

## 2017-11-23 NOTE — Patient Instructions (Signed)
Follow up visit in 3 months. 

## 2017-11-23 NOTE — Progress Notes (Signed)
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  HISTORY OF PRESENT ILLNESS:   [name] is a 32 y.o. Caucasian young man.  [name] was accompanied by his sister, [name], and his friend, Gerlene Burdock.     1. I first consulted on [name] when he was admitted to the Pediatric Ward at the [facility] in May 2007.  A. The patient was diagnosed with T1DM at age 51 in about 2001 or 2002. We have few details about his medical care until 2007. We know that in about 2006 he saw a pediatric endocrinologist at [facility], [facility], [facility]. Unfortunately, the family chose not to return to see the endocrinologist. In about April of 2007, his primary care provider, Dr. Reed Breech of [facility], started the patient on an insulin pump. Unfortunately, neither the patient nor the family were capable of utilizing the pump's technology effectively.   B. On 10/02/2005 the patient was admitted to the [facility] for poorly controlled type 1 diabetes. I consulted on the patient at that time. 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 He did not have the insight or intelligence to perform the daily tasks of diabetes self-care independently or to utilize the insulin pump. It also appeared that there was not a great deal of adult supervision and support within his family. We tried to teach him and his family how to follow a multiple daily injection of insulin plan with Lantus and Novolog insulin, but that attempt failed  rapidly. I changed his regimen to Novolog Mix 70/30 insulin taken twice daily. He was to take 26 units each morning at breakfast and 14 units each evening at supper. I also gave him a sliding scale for Humalog lispro with different doses of insulin to be used at breakfast, lunch, supper, and bedtime in addition to his 70/30 insulin when his family members were available to check the blood sugars and give the additional insulin. The patient's family members agreed to supervise his care.  2. Unfortunately, the patient has frequently not had the adult supervision and support that he needed. In the last 12 years, the patient has been scheduled to return to our clinic for follow up visits approximately every 3 months, but has actually only returned to our clinic for follow up visits 1-4 times per year, although the family has done much better in the past three years. In 2012 he had 3 visits and was admitted once. In 2013 he had one admission in December, followed by a clinic visit later that month. In 2014 he had 4 clinic visits. In 2015 he had two clinic visits. In fairness, however, I should note that I was unavailable to see him for two months in 2015 due to my severe leg injury and post-operative recuperation. He had 4 visits in 2016 and 4 visits in 2017, but in 2018 he only had 3 visits. Today is his third visit in 2019.  3. During the past 12 years [name] has had multiple medical problems:  A. T1DM: His hemoglobin A1c values have been mostly in the 9.0-14.0% range. He has the following complications of diabetes: diabetic angiopathy, peripheral neuropathy, autonomic  neuropathy, inappropriate sinus tachycardia, gastroparesis and postprandial abdominal bloating, chronic constipation, urinary microalbuminuria, and occasional but significant hypoglycemia. Due to health insurance restrictions he has taken Novolog 70/30 Mix insulin at some times and Humalog 75/25 Mix insulin at other times.   B. Autoimmune  hypothyroidism: In November 2007, the patient was diagnosed with hypothyroidism secondary to Hashimoto's disease and was started on Synthroid at a dose of 25 mcg per day.   C. Hypertension: In November 2008, he was diagnosed with hypertension and started on lisinopril, 5 mg per day.   D. Hypercholesterolemia: In July 2014 he was diagnosed with hypercholesterolemia and started on atorvastatin, 10 mg/day.  E. Noncompliance/medical neglect/inadequate medical supervision: Unfortunately, [name] has frequently missed BG checks, doses of insulin, and doses of his oral medications. Parental/family supervision has varied in consistency and in quality throughout these years.Since [name] has been living with [name] and her family, however, supervision has been much better.  4. [name]' last [facility] clinic visit was on 09/26/17.  At that visit we continued his Humalog Mix 75/25 insulin doses of 32 units in the mornings and 27 units in the evenings.    A. In the interim he has been healthy. His dentures are working well.     B. BG control has been quite variable. When he works outside in the heat his BGs tend to drop. Conversely, when he drinks regular sodas when he is thirsty, the BGs spike upward. He has taken most doses of insulin, but sometimes misses doses.  He says that he has been walking more and riding his bike more. He says that he is not snacking as much. Sister agrees about the exercise, but disagrees about the snacking.  [name]' sister, [name], now supervises his care in her home. [name] listens to her more about taking his insulin.   D. He now has both Medicaid and Medicare coverage, however, [place] Medicaid will no longer pay for medications. He now receives his insulins and DM supplies through [facility].    E. Providing adequate adult supervision for [name] is reportedly going better.    F.  He is not doing as much itching and scratching.   G. He is supposed to be taking 25 mcg of Synthroid daily, 10  mg of lisinopril daily, and 10 mg of atorvastatin daily. He thinks that he does not miss many doses. [name] has not been directly supervising him taking his oral meds, but agreed to do so.   5. Pertinent Review of Systems:  Constitutional: The patient feels "good overall". He says that he is still working on his irritability, depression, and willingness to cooperate with his DM care plan still vary, but in general he has been fairly cooperative since moving in with his sister and her family. However, when he becomes hypoglycemic or extremely hyperglycemic, he can still be very irritable and sometimes even combative.  Eyes: Vision is a little better now with his glasses. He is legally blind. He had his annual eye exam in late Autumn 2018.  Cataracts were seen, but did not require surgery.  Neck: The patient has no complaints of anterior neck swelling, soreness, tenderness, pressure, discomfort, or difficulty swallowing.  Heart: The patient has no complaints of palpitations, irregular heat beats, chest pain, or chest pressure. Gastrointestinal: He is no longer constipated and does not have bloating after meals. The patient has no other complaints of excessive hunger, acid reflux, stomach aches or pains, diarrhea, or constipation. Legs: As above. Muscle mass and  strength seem normal. There are no complaints of numbness, tingling, burning, or pain. No edema is noted. Feet: [name] has had more tingling of his feet. He admits that he is still picking at his toe nails. There are no recent complaints of numbness, tingling, burning, or pain. No edema is noted. GU: He rarely has nocturia. Hypoglycemia: He has had more low BGs in the past month, especially when he was working outside in the heat.    .    6. BG meter review: He checks BGs from 0-5 times daily, average 3.4 times per day. He ran out of test strips for four days in the past week. He has pretty consistently been checking BGs at his first meal of the  day and again when he has dinner. Morning BGs have varied between 79-520, compared with 157 and 491 at his last visit. Dinner BGs have varied between 54-High, compared with 64-High at his last visit. His average BG was 280, compared with 323 at his last visit and with 255 at the prior visit. He had 24 BGs >400, compared with 30 at his last visit: 3 of those 24 BGs were High. Two of the three High BGs were re-checks of BGs >500. He had 5 separate BGs <70: varying from 54-69. Most of the low BGs occurred in the afternoons and early evenings before dinner after he had been working outside in the heat.     PAST MEDICAL, FAMILY, AND SOCIAL HISTORY:  Past Medical History:  Diagnosis Date  . Angiopathy, diabetic (HCC)   . Diabetes mellitus    Type 1, diagnosed age 4, with frequent DKA admissions, difficult to control due to MR  . Diabetic nephropathy (HCC)    Started on Lisinopril 5 mg  . Diabetic peripheral neuropathy (HCC)   . Dysmorphic features   . Goiter   . Hypertension   . Hypoglycemia associated with diabetes (HCC)   . Hypothyroidism   . Impaired cognition    mention of mental retardation  . Mental retardation   . Moderate or severe vision impairment, both eyes, impairment level not further specified   . 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 Thyroiditis, autoimmune     Family History  Problem Relation Age of Onset  . Vision loss Father   . Retinitis pigmentosa Mother   . Diabetes Maternal Grandmother        AODM  . Diabetes Paternal Grandmother        AODM  . Diabetes Cousin        Second cousin has juvenile-onset DM.     Current Outpatient Medications:  .  atorvastatin (LIPITOR) 10 MG tablet, TAKE 1 TABLET (10 MG TOTAL) BY MOUTH DAILY., Disp: 90 tablet, Rfl: 0 .  glucose blood (ONETOUCH VERIO) test strip, Check Bg 3x daily, Disp: 100 each, Rfl: 5 .  Insulin Lispro Prot & Lispro (HUMALOG MIX 75/25 KWIKPEN) (75-25) 100 UNIT/ML Kwikpen, INJECT 34 UNITS OF  INSULIN IN THE MORNING AND 20 UNITS IN THE EVENING, Disp: 15 mL, Rfl: 5 .  lisinopril (PRINIVIL,ZESTRIL) 10 MG tablet, Take 10 mg by mouth daily., Disp: , Rfl:  .  SYNTHROID 25 MCG tablet, TAKE 1 TABLET (25 MCG TOTAL) BY MOUTH DAILY BEFORE BREAKFAST., Disp: 90 tablet, Rfl: 1  Allergies as of 11/23/2017  . (No Known Allergies)    1. Work and Family:  [name] remains unemployed. He is still living with his sister, [name] and her family.  [name] stays at home and so can look after [name]. [name]' friend, Gerlene Burdock, also lives in the house. [name] and Richard help each other out. Mom has gone back to work.  2. Activities: He has been walking more and riding his bike more recently.  3. Smoking, alcohol, or drugs: No tobacco or alcohol or drugs.  4. Primary Care Provider: None at present  REVIEW OF SYSTEMS: There are no other significant problems involving Javell's other body systems.   Objective:  Vital Signs:  BP 120/66   Pulse 80   Wt 133 lb 6.4 oz (60.5 kg)   BMI 22.50 kg/m    Ht Readings from Last 3 Encounters:  09/26/17 5' 4.57" (1.64 m)  06/27/17 5' 4.25" (1.632 m)  09/29/16 5' 4.45" (1.637 m)   Wt Readings from Last 3 Encounters:  11/23/17 133 lb 6.4 oz (60.5 kg)  09/26/17 136 lb 3.2 oz (61.8 kg)  06/27/17 132 lb 9.6 oz (60.1 kg)   Body surface area is 1.66 meters squared.  Facility age limit for growth percentiles is 20 years. Facility age limit for growth percentiles is 20 years.  PHYSICAL EXAM:  Constitutional: The patient appears healthy, but his gait is still somewhat wide-based and shuffling due to his visual handicap.  His weight has decreased by 3 pounds. He was more engaged and interactive today. He still does not really remember much about checking his BGs and taking his insulins. His thought processes are still very concrete. His insight remains poor. He really does not understand or comprehend much about diabetes, about why he needs to take care of  himself, and about what will happen to him if he does not take care of himself. He still often resents having to limit what he eats and having to deal with the demands of having T1DM.Marland Kitchen  Face: The face is somewhat dysmorphic. His face is very similar to mom's face and [name]'s face. Eyes: There is no obvious arcus or proptosis. His eyes are slightly dry.   Mouth: The oropharynx appears normal. His geographic tongue has resolved. He no longer has any maxillary teeth, but he is wearing his new dentures. His mouth is somewhat dry.  Neck: The neck appears to be visibly normal. No carotid bruits are noted. The thyroid gland has shrunk back to normal size today. The consistency of the thyroid gland is normal today. The thyroid gland is not tender to palpation. Lungs: The lungs are clear to auscultation. Air movement is good. Heart: Heart rate and rhythm are regular. Heart sounds S1 and S2 are normal. I did not appreciate any pathologic cardiac murmurs. Abdomen: The abdomen is normal in size. Bowel sounds are normal. There is no obvious hepatomegaly, splenomegaly, or other mass effect.  Arms: Muscle size and bulk are normal for age. He has no new areas of excoriation on his arms. The older areas are healing or have healed.  Hands: There is no obvious tremor. Phalangeal and metacarpophalangeal joints are normal. Palmar muscles are normal. Palmar skin is normal. Palmar moisture is also normal. Legs: Muscles appear normal for age. No edema is present. Feet: Feet are normally formed. Dorsalis pedal pulses are faint 1+ on the right and 1+ on the left. PT pulses are faint 1+ bilaterally. Toe nails are normal today.   Neurologic: Strength is normal for age in both the upper and lower extremities. Muscle tone is normal. Sensation to touch is normal in both the legs and feet.     LAB DATA:  Labs 11/23/17: CBG 338  Labs 09/26/17: HbA1c 10.9%, CBG 364, urine glucose 2000, urine ketones negative  Labs 06/27/17:  HbA1c 9.0%, CBG 134; TSH 1.49, free T4 1.2, free T3 3.1; CMP normal, except for glucose of 213; cholesterol 134, triglycerides 79, HDL 53, LDL 65; urinary microalbumin/creatinine ratio was 748   Labs 12/31/15: HbA1c 11.7%, CBG 145  Labs 09/29/16: HbA1c 10.2%, CBG 70  Labs 06/01/16: HbA1c >14%. CBG 288; TSH 2.65, free T4 1.2, free T3 2.7; cholesterol 201, triglycerides 106, HDL 54, LDL 127; CMP normal except glucose 130 and albumin 3.5.   Labs 01/05/16: HbA1c 11.0%  Labs 11/04/15: HbA1c 10.5%  Labs 09/12/15: HbA1c 10.3%; CMP normal except for glucose 149 and sodium 133; cholesterol 190, triglycerides 90, HDL 55, LDL 117; urinary microalbumin/creatinine ratio 190; TSH 3.09, free T4 1.3, free T3 3.8  Labs 07/16/15: HbA1c 10.6%, Urine dipstick shows trace ketones  Labs 04/09/15: HbA1c 10.4%  Labs 09/09/14: HbA1c 11.9%; TSH 1.822, free T4 1.02, and free T3 2.6; CMP normal except for glucose of 273; cholesterol 174, triglycerides 181, HDL 46, LDL 92; urinary microalbumin/creatinine ratio 139.2  Labs 06/19/14: HbA1c 10.7%  Labs 05/12/14: CMP normal  Labs 02/19/14: HbA1c 10.2%  Labs 07/31/13: HbA1c 8.5%; CMP normal, except glucose 269; microalbumin/creatinine ratio 51.6; TSH 1.885, free T4 1.31, free T3 3.4   Labs 7/018/14: CMP normal, except glucose of 14; cholesterol 202, triglycerides 85, HDL 58, LDL 127; urinary microalbumin/creatinine ratio 51.9; TSH 2.993, free T4 1.32, free T3 2.8  05/10/12: Creatinine 0.62, TSH 1.025,            Assessment and Plan:   ASSESSMENT:  1-2. Diabetes mellitus/hypoglycemia:   A. The patient's BGs and HbA1c were much higher at his last visit. His average BG is lower today, in part due to having a bit more hypoglycemia, but also in part to having fewer BGs >400. He still often misses insulin doses if he is not supervised. I asked [name] to have [name] OhmChris test his BGs, take his insulins, and take his oral meds directly in front of her. I also asked her to try to limit  [name]'s late night snacks.   B. He has had 5 BGs <70 this month. Most have occurred in the afternoons and evenings after working or exercising outside in the heat. If he knows that he will be exercising, he should reduce the insulin dose at the meal prior by two units.   [name]. [name] needs continuing adult supervision.  3. Hypertension: Blood pressure is normal today.  4. Peripheral neuropathy: This problem has improved and is not evident today, but will certainly recur if he does not get his BGs under better control.   5. Angiopathy: His angiopathy is still fairly severe, but is improving. If he exercises on a regular basis and controls his BGs, the blood flow in his legs and feet will likely improve further. 6. Goiter: Thyroid gland has shrunk back to normal size today. The process of waxing and waning of thyroid gland size and thyroid lobe size is c/w evolving Hashimoto's disease.  7. Hypothyroid: He was mid-range euthyroid in April 2016, but borderline low in April 2017. His TSH in January 2018 was better, but still above the goal range of 1.0-2.0. His TSH in February 2019 was within the goal range. He needs to take his Synthroid daily.  8. Autonomic neuropathy, tachycardia, bloating, and gastroparesis: His heart rate is much lower today, c/w him having fewer high BGs. His abdominal bloating and  constipation have also resolved. The previous improvement in his autonomic neuropathy paralleled his improvement in BGs. His previous worsening of autonomic neuropathy also paralleled his worsening of BGs.  9. Combined hyperlipidemia: His lipids in February 2019 were normal on his current dose of atorvastatin.  10. Weight loss, unintentional: This process had resolved with him taking more insulin and much more food, but can recur when he is not as compliant with his DM care plan.    11. Dehydration: He is not dehydrated today.  12. Medical neglect: [name]' sister and her husband have been trying to supervise  [name], although he often makes it difficult for them to do so.  13. 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  14. Microalbuminuria: His microalbumin/creatinine ratio in February 2019 was the highest that it has been in the past four nears. He needs better control of both his BGs and his BPs. He still needs to take his lisinopril every day.    PLAN:  1. Diagnostic: CBG today. Reviewed BG printout. Call Dr. Fransico Amr Sturtevant in 2 weeks on Sunday evening between 8:00-9:30 PM to discuss BGs. Repeat lab tests at his next visit.  2. Therapeutic:  Continue the Humalog Mix 75/25 insulin doses of 32 units in the mornings and 27 units in the evenings. If BGs are less than 100 at breakfast, reduce the insulin dose to 27 units. If BGs are <100 at dinner, reduce the insulin dose to 24 units. Take oral medications once a day, at whatever time the family can fit in the doses. I asked [name] to directly supervise his BG checks, insulin dosing, and oral meds.  3. Patient education: We discussed all of the above. I asked [name] again to please cooperate with his family members. 4. Follow-up: 3 months. Ensure that [name] can attend.   Level of Service: This visit lasted in excess of 60 minutes. More than 50% of the visit was devoted to counseling.  Molli Knock, MD 11/23/2017 9:41 AM

## 2017-12-16 ENCOUNTER — Telehealth (INDEPENDENT_AMBULATORY_CARE_PROVIDER_SITE_OTHER): Payer: Self-pay | Admitting: "Endocrinology

## 2017-12-16 DIAGNOSIS — E1065 Type 1 diabetes mellitus with hyperglycemia: Principal | ICD-10-CM

## 2017-12-16 DIAGNOSIS — IMO0001 Reserved for inherently not codable concepts without codable children: Secondary | ICD-10-CM

## 2017-12-16 MED ORDER — GLUCOSE BLOOD VI STRP: ORAL_STRIP | 5 refills | Status: DC

## 2017-12-16 NOTE — Telephone Encounter (Signed)
Who's calling (name and relationship to patient) : Loken, Tanna Savoyhristopher L (Self) Best contact number: [phone] (H) Provider they see: Fransico MichaelBrennan, MD Reason for call: Patient is calling in regards to needing more test strips. Caller states the pharmacy will not be able to process the refill until July 31st.

## 2017-12-16 NOTE — Telephone Encounter (Signed)
Spoke with patient and let him know that we have strips here for pick up if he is able to make it in today. Patient also states that he is checking his sugars 3-4 times a day. Informed patient that we can resend a prescription to indicate that he is to check 4 times a day so he does not run out of strips as frequently as he is. Patient states understanding and ended the call.

## 2018-02-01 ENCOUNTER — Other Ambulatory Visit (INDEPENDENT_AMBULATORY_CARE_PROVIDER_SITE_OTHER): Payer: Self-pay | Admitting: "Endocrinology

## 2018-02-01 DIAGNOSIS — IMO0001 Reserved for inherently not codable concepts without codable children: Secondary | ICD-10-CM

## 2018-02-01 DIAGNOSIS — E1065 Type 1 diabetes mellitus with hyperglycemia: Principal | ICD-10-CM

## 2018-02-23 ENCOUNTER — Ambulatory Visit (INDEPENDENT_AMBULATORY_CARE_PROVIDER_SITE_OTHER): Payer: Medicare Other | Admitting: "Endocrinology

## 2018-02-23 ENCOUNTER — Encounter (INDEPENDENT_AMBULATORY_CARE_PROVIDER_SITE_OTHER): Payer: Self-pay | Admitting: "Endocrinology

## 2018-02-23 VITALS — BP 108/64 | HR 108 | Ht 64.41 in | Wt 138.4 lb

## 2018-02-23 DIAGNOSIS — R000 Tachycardia, unspecified: Secondary | ICD-10-CM

## 2018-02-23 DIAGNOSIS — E860 Dehydration: Secondary | ICD-10-CM

## 2018-02-23 DIAGNOSIS — R634 Abnormal weight loss: Secondary | ICD-10-CM | POA: Diagnosis not present

## 2018-02-23 DIAGNOSIS — I10 Essential (primary) hypertension: Secondary | ICD-10-CM | POA: Diagnosis not present

## 2018-02-23 DIAGNOSIS — E1065 Type 1 diabetes mellitus with hyperglycemia: Secondary | ICD-10-CM | POA: Diagnosis not present

## 2018-02-23 DIAGNOSIS — E10649 Type 1 diabetes mellitus with hypoglycemia without coma: Secondary | ICD-10-CM | POA: Diagnosis not present

## 2018-02-23 DIAGNOSIS — IMO0001 Reserved for inherently not codable concepts without codable children: Secondary | ICD-10-CM

## 2018-02-23 DIAGNOSIS — R809 Proteinuria, unspecified: Secondary | ICD-10-CM | POA: Diagnosis not present

## 2018-02-23 DIAGNOSIS — E1043 Type 1 diabetes mellitus with diabetic autonomic (poly)neuropathy: Secondary | ICD-10-CM

## 2018-02-23 DIAGNOSIS — E1042 Type 1 diabetes mellitus with diabetic polyneuropathy: Secondary | ICD-10-CM | POA: Diagnosis not present

## 2018-02-23 DIAGNOSIS — E063 Autoimmune thyroiditis: Secondary | ICD-10-CM | POA: Diagnosis not present

## 2018-02-23 DIAGNOSIS — E782 Mixed hyperlipidemia: Secondary | ICD-10-CM | POA: Diagnosis not present

## 2018-02-23 DIAGNOSIS — E049 Nontoxic goiter, unspecified: Secondary | ICD-10-CM

## 2018-02-23 LAB — POCT GLYCOSYLATED HEMOGLOBIN (HGB A1C): HEMOGLOBIN A1C: 8.8 % — AB (ref 4.0–5.6)

## 2018-02-23 LAB — POCT GLUCOSE (DEVICE FOR HOME USE): POC Glucose: 201 mg/dl — AB (ref 70–99)

## 2018-02-23 NOTE — Progress Notes (Signed)
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  HISTORY OF PRESENT ILLNESS:   [name] is a 32 y.o. Caucasian young man.  Monty was accompanied by his sister, [name], and her toddler.      1. I first consulted on [name] when he was admitted to the Pediatric Ward at the [facility] in May 2007.  A. The patient was diagnosed with T1DM at age 40 in about 2001 or 2002. We have few details about his medical care until 2007. We know that in about 2006 he saw a pediatric endocrinologist at [facility], [facility], [facility]. Unfortunately, the family chose not to return to see the endocrinologist. In about April of 2007, his primary care provider, Dr. Reed Breech of [facility], started the patient on an insulin pump. Unfortunately, neither the patient nor the family were capable of utilizing the pump's technology effectively.   B. On 10/02/2005 the patient was admitted to the [facility] for poorly controlled type 1 diabetes. I consulted on the patient at that time. 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 He did not have the insight or intelligence to perform the daily tasks of diabetes self-care independently or to utilize the insulin pump. It also appeared that there was not a great deal of adult supervision and support within his family. We tried to teach him and his family how to follow a multiple daily injection of insulin plan with Lantus and Novolog insulin, but that attempt failed rapidly. I  changed his regimen to Novolog Mix 70/30 insulin taken twice daily. He was to take 26 units each morning at breakfast and 14 units each evening at supper. I also gave him a sliding scale for Humalog lispro with different doses of insulin to be used at breakfast, lunch, supper, and bedtime in addition to his 70/30 insulin when his family members were available to check the blood sugars and give the additional insulin. The patient's family members agreed to supervise his care.  2. Unfortunately, the patient has frequently not had the adult supervision and support that he needed. In the last 12 years, the patient has been scheduled to return to our clinic for follow up visits approximately every 3 months, but has actually only returned to our clinic for follow up visits 1-4 times per year, although the family has done much better in the past three years. In 2012 he had 3 visits and was admitted once. In 2013 he had one admission in December, followed by a clinic visit later that month. In 2014 he had 4 clinic visits. In 2015 he had two clinic visits. In fairness, however, I should note that I was unavailable to see him for two months in 2015 due to my severe leg injury and post-operative recuperation. He had 4 visits in 2016 and 4 visits in 2017, but in 2018 he only had 3 visits. Today is his fourth visit in 2019. Since moving in with his sister and her family in January 2019, the adult supervision and support in Wyndham' life have substantially improved.  3. During the past 12 years [name] has had multiple medical problems:  A. T1DM:  His hemoglobin A1c values have been mostly in the 9.0-14.0% range. He has the following complications of diabetes: diabetic angiopathy, peripheral neuropathy, autonomic neuropathy, inappropriate sinus tachycardia, gastroparesis and postprandial abdominal bloating, chronic constipation, urinary microalbuminuria, and occasional but significant hypoglycemia. Due to health insurance  restrictions he has taken Novolog 70/30 Mix insulin at some times and Humalog 75/25 Mix insulin at other times.   B. Autoimmune hypothyroidism: In November 2007, the patient was diagnosed with hypothyroidism secondary to Hashimoto's disease and was started on Synthroid at a dose of 25 mcg per day.   C. Hypertension: In November 2008, he was diagnosed with hypertension and started on lisinopril, 5 mg per day.   D. Hypercholesterolemia: In July 2014 he was diagnosed with hypercholesterolemia and started on atorvastatin, 10 mg/day.  E. Noncompliance/medical neglect/inadequate medical supervision: Unfortunately, [name] has frequently missed BG checks, doses of insulin, and doses of his oral medications. Parental/family supervision and support have varied in consistency and in quality throughout these years. However, since [name] has been living with [name] and her family, the supervision and support has been much better.  4. [name]' last [facility] clinic visit was on 11/23/17.  At that visit we continued his Humalog Mix 75/25 insulin doses of 32 units in the mornings and 27 units in the evenings.    A. In the interim he has been healthy.   B. However, in the past 1-2 months he has experienced more low BGs throughout the 24-hour period, but most frequently about 5 AM and at dinner/bedtime. He has been trying to eat better and has been exercising  more. He reduced his morning insulin dose to 30 units, but continued his evening dose of 27 units. His dentures are working well.     C. BG control has still been quite variable. When he works outside in the heat his BGs tend to drop. Conversely, when he drinks regular sodas when he is thirsty, the BGs spike upward. He has taken most doses of insulin, but misses at least one dose. He says that he has been walking more and riding his bike more. He says that he is still snacking a lot. Sister agrees about the exercise and about the snacking.  [name]' sister, [name], now  supervises his care in her home. [name] listens to her more about taking his insulin. Providing adequate adult supervision for [name] is going much better.    E. He now has both Medicaid and Medicare coverage, however, [place] Medicaid will no longer pay for medications. He now receives his insulins and DM supplies through [facility].    F.  He is still doing some itching and scratching.   G. He is supposed to be taking 25 mcg of Synthroid daily, 10 mg of lisinopril daily, and 10 mg of atorvastatin daily. He thinks that he does not miss many doses. [name] says that he does miss doses.   5. Pertinent Review of Systems:  Constitutional: The patient feels "good overall". He says that he is doing better with his irritability, depression, and willingness to cooperate with his DM care plan. [name] concurs that he is a work in progress.  However, when he becomes hypoglycemic or extremely hyperglycemic, he can still be very irritable and sometimes even combative.  Eyes: Vision is a little better now with his glasses. He is legally blind. He had his annual eye exam in late Autumn 2018.  Cataracts were seen, but did not require surgery.  Neck: The patient has no  complaints of anterior neck swelling, soreness, tenderness, pressure, discomfort, or difficulty swallowing.  Heart: The patient has no complaints of palpitations, irregular heat beats, chest pain, or chest pressure. Gastrointestinal: He is no longer constipated and does not have bloating after meals. The patient has no other complaints of excessive hunger, acid reflux, stomach aches or pains, diarrhea, or constipation. Legs: As above. Muscle mass and strength seem normal. There are no complaints of numbness, tingling, burning, or pain. No edema is noted. Feet: [name] has not had any recent tingling in his feet. He admits that he is still picking at his toe nails. There are no recent complaints of numbness, tingling, burning, or pain. No edema is  noted. GU: He rarely has nocturia. Hypoglycemia: He has had many more low BGs in the past month, especially when he was working outside in the heat.    .    6. BG meter review: He checks BGs from 3-8 times daily, average 4.5 times per day. BGs between 4-6 AM varied from 62-205. Morning BGs varied between 63-435, compared with 79-520 at his last visit and with 157 and 491 at his prior visit. Lunch BGs varied from 61-High. Dinner BGs varied between [id number], compared with 54-High, at his last visit and with 64-High at his prior visit. Bedtime BGs varied from 51-High. Two of his three low BGs during the night occurred when his bedtime BG was <200 and he did not take a bedtime snack. One of his two low BGs in the mornings occurred when his BG at bedtime was <200 and he did not take a snack. One of his two low BGs at lunch occurred when his BG was <100 at breakfast and he took his usual insulin dose. All four of his low BGs at dinner occurred when his BGs during the day were <200.  Two of his three low BGs at bedtime occurred when his BGs at dinner were <100.   PAST MEDICAL, FAMILY, AND SOCIAL HISTORY:  Past Medical History:  Diagnosis Date  . Angiopathy, diabetic (HCC)   . Diabetes mellitus    Type 1, diagnosed age 89, with frequent DKA admissions, difficult to control due to MR  . Diabetic nephropathy (HCC)    Started on Lisinopril 5 mg  . Diabetic peripheral neuropathy (HCC)   . Dysmorphic features   . Goiter   . Hypertension   . Hypoglycemia associated with diabetes (HCC)   . Hypothyroidism   . Impaired cognition    mention of mental retardation  . Mental retardation   . Moderate or severe vision impairment, both eyes, impairment level not further specified   . 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 Thyroiditis, autoimmune     Family History  Problem Relation Age of Onset  . Vision loss Father   . Retinitis pigmentosa Mother   . Diabetes Maternal Grandmother         AODM  . Diabetes Paternal Grandmother        AODM  . Diabetes Cousin        Second cousin has juvenile-onset DM.     Current Outpatient Medications:  .  atorvastatin (LIPITOR) 10 MG tablet, TAKE 1 TABLET (10 MG TOTAL) BY MOUTH DAILY., Disp: 90 tablet, Rfl: 0 .  glucose blood (ONETOUCH VERIO) test strip, USE TO CHECK BLOOD SUGAR 4 TIMES DAILY AS DIRECTED, Disp: 150 each, Rfl: 5 .  Insulin Lispro Prot & Lispro (HUMALOG MIX 75/25 KWIKPEN) (75-25) 100  UNIT/ML Kwikpen, INJECT 34 UNITS OF INSULIN IN THE MORNING AND 20 UNITS IN THE EVENING, Disp: 15 mL, Rfl: 5 .  lisinopril (PRINIVIL,ZESTRIL) 10 MG tablet, Take 10 mg by mouth daily., Disp: , Rfl:  .  SYNTHROID 25 MCG tablet, TAKE 1 TABLET (25 MCG TOTAL) BY MOUTH DAILY BEFORE BREAKFAST., Disp: 90 tablet, Rfl: 1  Allergies as of 02/23/2018  . (No Known Allergies)    1. Work and Family: [name] remains unemployed. He is still living with his sister, [name] and her family. [name] stays at home and so can look after [name]. [name]' friend, Gerlene Burdock, also lives in the house. [name] and Richard help each other out and exercise together. Mom has gone back to work.  2. Activities: He has been walking more and riding his bike more recently.  3. Smoking, alcohol, or drugs: No tobacco or alcohol or drugs.  4. Primary Care Provider: None at present  REVIEW OF SYSTEMS: There are no other significant problems involving Lateef's other body systems.   Objective:  Vital Signs:  BP 108/64   Pulse (!) 108   Ht 5' 4.41" (1.636 m)   Wt 138 lb 6.4 oz (62.8 kg)   BMI 23.46 kg/m    Ht Readings from Last 3 Encounters:  02/23/18 5' 4.41" (1.636 m)  09/26/17 5' 4.57" (1.64 m)  06/27/17 5' 4.25" (1.632 m)   Wt Readings from Last 3 Encounters:  02/23/18 138 lb 6.4 oz (62.8 kg)  11/23/17 133 lb 6.4 oz (60.5 kg)  09/26/17 136 lb 3.2 oz (61.8 kg)   Body surface area is 1.69 meters squared.  Facility age limit for growth percentiles is 20  years. Facility age limit for growth percentiles is 20 years.  PHYSICAL EXAM:  Constitutional: The patient appears healthy, but his gait is still somewhat wide-based and shuffling due to his visual handicap.  His weight has increased by 5 pounds. He was somewhat more engaged and interactive today. He still does not really remember much about checking his BGs and taking his insulins. His thought processes are still very concrete. His insight remains poor. He really does not understand or comprehend much about diabetes, about why he needs to take care of himself, and about what will happen to him if he does not take care of himself. He still often resents having to limit what he eats and having to deal with the demands of having T1DM.Marland Kitchen  Face: The face is somewhat dysmorphic. His face is very similar to mom's face and [name]'s face. Eyes: There is no obvious arcus or proptosis. His eyes are slightly dry.  Mouth: The oropharynx appears normal. His geographic tongue has resolved. He no longer has any maxillary teeth, but he is wearing his new dentures. His mouth is somewhat dry.  Neck: The neck appears to be visibly normal. No carotid bruits are noted. The thyroid gland is larger today. The consistency of the thyroid gland is normal today. The thyroid gland is not tender to palpation. Lungs: The lungs are clear to auscultation. Air movement is good. Heart: Heart rate is rapid, but rhythm is regular. Heart sounds S1 and S2 are normal. I did not appreciate any pathologic cardiac murmurs. Abdomen: The abdomen is normal in size, but he has more adipose tissue today. Bowel sounds are normal. There is no obvious hepatomegaly, splenomegaly, or other mass effect.  Arms: Muscle size and bulk are normal for age. He has no new areas of excoriation on his arms. The older  areas are healing or have healed.  Hands: There is no obvious tremor. Phalangeal and metacarpophalangeal joints are normal. Palmar muscles are  normal. Palmar skin is normal. Palmar moisture is also normal. Legs: Muscles appear normal for age. No edema is present. Feet: Feet are normally formed. Dorsalis pedal pulses are faint 1+ bilaterally. PT pulses are faint 1+ bilaterally. Toe nails are normal today.   Neurologic: Strength is normal for age in both the upper and lower extremities. Muscle tone is normal. Sensation to touch is normal in both the legs and feet.     LAB DATA:   Labs 02/23/18: HbA1c 8.8%, CBG 201  Labs 11/23/17: CBG 338  Labs 09/26/17: HbA1c 10.9%, CBG 364, urine glucose 2000, urine ketones negative  Labs 06/27/17: HbA1c 9.0%, CBG 134; TSH 1.49, free T4 1.2, free T3 3.1; CMP normal, except for glucose of 213; cholesterol 134, triglycerides 79, HDL 53, LDL 65; urinary microalbumin/creatinine ratio was 748   Labs 12/31/15: HbA1c 11.7%, CBG 145  Labs 09/29/16: HbA1c 10.2%, CBG 70  Labs 06/01/16: HbA1c >14%. CBG 288; TSH 2.65, free T4 1.2, free T3 2.7; cholesterol 201, triglycerides 106, HDL 54, LDL 127; CMP normal except glucose 130 and albumin 3.5.   Labs 01/05/16: HbA1c 11.0%  Labs 11/04/15: HbA1c 10.5%  Labs 09/12/15: HbA1c 10.3%; CMP normal except for glucose 149 and sodium 133; cholesterol 190, triglycerides 90, HDL 55, LDL 117; urinary microalbumin/creatinine ratio 190; TSH 3.09, free T4 1.3, free T3 3.8  Labs 07/16/15: HbA1c 10.6%, Urine dipstick shows trace ketones  Labs 04/09/15: HbA1c 10.4%  Labs 09/09/14: HbA1c 11.9%; TSH 1.822, free T4 1.02, and free T3 2.6; CMP normal except for glucose of 273; cholesterol 174, triglycerides 181, HDL 46, LDL 92; urinary microalbumin/creatinine ratio 139.2  Labs 06/19/14: HbA1c 10.7%  Labs 05/12/14: CMP normal  Labs 02/19/14: HbA1c 10.2%  Labs 07/31/13: HbA1c 8.5%; CMP normal, except glucose 269; microalbumin/creatinine ratio 51.6; TSH 1.885, free T4 1.31, free T3 3.4   Labs 7/018/14: CMP normal, except glucose of 14; cholesterol 202, triglycerides 85, HDL 58, LDL 127;  urinary microalbumin/creatinine ratio 51.9; TSH 2.993, free T4 1.32, free T3 2.8  05/10/12: Creatinine 0.62, TSH 1.025,            Assessment and Plan:   ASSESSMENT:  1-2. Diabetes mellitus/hypoglycemia:   A. The patient's HbA1c is much lower today, but at the cost of having too many low BGs.   B. With [name]'s supervision, [name] has been eating more healthy. He has also been exercising a lot. As a result, his BGs are lower.  C He still often misses insulin doses if he is not directly supervised, but far fewer than before. I asked [name] to have [name] test his BGs, take his insulins, and take his oral meds directly in front of her. I also asked her to try to limit [name]'s late night snacks.   B. He has had 9 BGs <70 this month. Most have occurred during the night or in the evenings. Several of the low nocturnal BGs could have been prevented if he had a programmed bedtime snack if the bedtime BGs were <200. Most of the low BGs in the evenings could have been prevented if he had reduced his morning dose of insulin whenever the morning BG was <100. Some of the evening low BGS could have been prevented if he had reduced his dinner insulin dose if the BG prior to dinner was <100.   [name] needs continuing adult supervision.  3. Hypertension: Blood pressure is normal today.  4. Peripheral neuropathy: This problem has improved and is not evident today, but will certainly recur if he does not get his BGs under better control.   5. Angiopathy: His angiopathy is still fairly severe, but is improving. If he exercises on a regular basis and controls his BGs, the blood flow in his legs and feet will likely improve further. 6. Goiter/thyroiditis: Thyroid gland has increased in size again. The process of waxing and waning of thyroid gland size is c/w evolving Hashimoto's thyroiditis.   7. Hypothyroid: He was mid-range euthyroid in April 2016, but borderline low in April 2017. His TSH in January 2018 was  better, but still above the goal range of 1.0-2.0. His TSH in February 2019 was within the goal range. He needs to take his Synthroid daily. We need to repeat his TFTs today.  8. Autonomic neuropathy, tachycardia, bloating, and gastroparesis: His heart rate is elevated again today. His bloating and constipation have resolved. The improvement in his autonomic neuropathy paralleled his improvement in BGs. His previous worsening of autonomic neuropathy also paralleled his worsening of BGs.  9. Combined hyperlipidemia: His lipids in February 2019 were normal on his current dose of atorvastatin.  10. Weight loss, unintentional: This process had resolved with him taking more insulin and more food, but can recur when he is not as compliant with his DM care plan.    11. Dehydration: He is not dehydrated today.  12. Medical neglect: [name]' sister and her husband are really trying to supervise [name], although he often makes it difficult for them to do so. Overall [name]' DM care is much better with [name]'s supervision. I complimented [name] for her outstanding support; 13. 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  14. Microalbuminuria/macroalbuminuria: His microalbumin/creatinine ratio in February 2019 was the highest that it has been in the past four nears. He needs better control of both his BGs and his BPs. He still needs to take his lisinopril every day.    PLAN:  1. Diagnostic: CBG today. Reviewed BG printout. Call Dr. Fransico Michael in 2 weeks on Sunday evening between 8:00-9:30 PM to discuss BGs. TFTs today. 2. Therapeutic:   A. Now that [name] is supervising [name]'s DM care, we can develop a more complicated plan that will help strike a better balance between hyperglycemia and hypoglycemia. Reduce  the Humalog Mix 75/25 insulin dose in the mornings to 28 units. Reduce the dinner insulin dose to 26 units. However, if the morning BG is <100, reduce the morning insulin dose to 26 units. If the dinner BG is  <100, reduce the dinner insulin dose to 25 units. If the BG at dinner is <200, follow the Small column bedtime snack plan.    B. Take oral medications once a day, at whatever time the family can fit in the doses.   C. I asked [name] to directly supervise his BG checks, insulin dosing, and oral meds.  3. Patient education: We discussed all of the above. I asked [name] again to please cooperate with his family members. 4. Follow-up: 3 months. Ensure that [name] can attend.   Level of Service: This visit lasted in excess of 65 minutes. More than 50% of the visit was devoted to counseling.  Molli Knock, MD 02/23/2018 9:33 AM

## 2018-02-23 NOTE — Patient Instructions (Addendum)
1. Please reduce the morning dose of 75/25 insulin to 28 units. Please reduce the dinner insulin dose to to 26 units.  2. If the BG at breakfast is <100, reduce the insulin dose at breakfast to 26 units.  3. If the BG at dinner is <100, reduce the insulin dose at dinner to 25 units. 4. Please call Dr. Fransico Danae Oland in two weeks on Sunday evening between 8:00-9:30 PM to discuss BGs.  5.Please follow the Small bedtime snack table if the BG at bedtime is <200. 6. Follow up visit in 3 months.

## 2018-02-24 ENCOUNTER — Other Ambulatory Visit (INDEPENDENT_AMBULATORY_CARE_PROVIDER_SITE_OTHER): Payer: Self-pay | Admitting: "Endocrinology

## 2018-02-24 ENCOUNTER — Other Ambulatory Visit (INDEPENDENT_AMBULATORY_CARE_PROVIDER_SITE_OTHER): Payer: Self-pay

## 2018-02-24 DIAGNOSIS — IMO0001 Reserved for inherently not codable concepts without codable children: Secondary | ICD-10-CM

## 2018-02-24 DIAGNOSIS — E1065 Type 1 diabetes mellitus with hyperglycemia: Principal | ICD-10-CM

## 2018-02-24 LAB — T3, FREE: T3, Free: 3.2 pg/mL (ref 2.3–4.2)

## 2018-02-24 LAB — TSH: TSH: 5 mIU/L — ABNORMAL HIGH (ref 0.40–4.50)

## 2018-02-24 LAB — T4, FREE: Free T4: 1.1 ng/dL (ref 0.8–1.8)

## 2018-02-24 MED ORDER — GLUCOSE BLOOD VI STRP: ORAL_STRIP | 5 refills | Status: DC

## 2018-02-24 NOTE — Telephone Encounter (Signed)
°  Who's calling (name and relationship to patient) : Anguel (mom) Best contact number: [phone] Provider they see: Fransico Michael Reason for call: Patient need test strips     PRESCRIPTION REFILL ONLY  Name of prescription:  Pharmacy:[facility]

## 2018-02-28 ENCOUNTER — Telehealth (INDEPENDENT_AMBULATORY_CARE_PROVIDER_SITE_OTHER): Payer: Self-pay | Admitting: "Endocrinology

## 2018-02-28 ENCOUNTER — Other Ambulatory Visit (INDEPENDENT_AMBULATORY_CARE_PROVIDER_SITE_OTHER): Payer: Self-pay | Admitting: *Deleted

## 2018-02-28 DIAGNOSIS — E1065 Type 1 diabetes mellitus with hyperglycemia: Principal | ICD-10-CM

## 2018-02-28 DIAGNOSIS — IMO0001 Reserved for inherently not codable concepts without codable children: Secondary | ICD-10-CM

## 2018-02-28 MED ORDER — GLUCOSE BLOOD VI STRP: ORAL_STRIP | 5 refills | Status: DC

## 2018-02-28 NOTE — Telephone Encounter (Signed)
°  Who's calling (name and relationship to patient) : [name] (Patient) Best contact number: [phone] Provider they see: Dr. Fransico Michael Reason for call: Pt requesting to come by the office to pick up test strips to last him until his insurance is able to cover the cost of test strips at the pharmacy.      PRESCRIPTION REFILL ONLY  Name of prescription:  Pharmacy:

## 2018-02-28 NOTE — Telephone Encounter (Signed)
Returned TC to patient to advise that order has been sent for refill of test strips to [facility] in [place]. Also advised that we do not have test strips in the office to give him. Patient ok with info given.

## 2018-03-06 ENCOUNTER — Encounter (INDEPENDENT_AMBULATORY_CARE_PROVIDER_SITE_OTHER): Payer: Self-pay | Admitting: *Deleted

## 2018-03-22 DIAGNOSIS — H40033 Anatomical narrow angle, bilateral: Secondary | ICD-10-CM | POA: Diagnosis not present

## 2018-04-03 DIAGNOSIS — H40031 Anatomical narrow angle, right eye: Secondary | ICD-10-CM | POA: Diagnosis not present

## 2018-04-08 ENCOUNTER — Other Ambulatory Visit (INDEPENDENT_AMBULATORY_CARE_PROVIDER_SITE_OTHER): Payer: Self-pay | Admitting: "Endocrinology

## 2018-04-08 DIAGNOSIS — IMO0001 Reserved for inherently not codable concepts without codable children: Secondary | ICD-10-CM

## 2018-04-08 DIAGNOSIS — E1065 Type 1 diabetes mellitus with hyperglycemia: Principal | ICD-10-CM

## 2018-04-17 DIAGNOSIS — H40032 Anatomical narrow angle, left eye: Secondary | ICD-10-CM | POA: Diagnosis not present

## 2018-05-26 ENCOUNTER — Ambulatory Visit (INDEPENDENT_AMBULATORY_CARE_PROVIDER_SITE_OTHER): Payer: Medicare Other | Admitting: "Endocrinology

## 2018-06-29 ENCOUNTER — Ambulatory Visit (INDEPENDENT_AMBULATORY_CARE_PROVIDER_SITE_OTHER): Payer: Medicare Other | Admitting: "Endocrinology

## 2018-07-27 ENCOUNTER — Ambulatory Visit (INDEPENDENT_AMBULATORY_CARE_PROVIDER_SITE_OTHER): Payer: Medicare Other | Admitting: "Endocrinology

## 2018-08-01 ENCOUNTER — Encounter (INDEPENDENT_AMBULATORY_CARE_PROVIDER_SITE_OTHER): Payer: Self-pay | Admitting: "Endocrinology

## 2018-08-01 ENCOUNTER — Ambulatory Visit (INDEPENDENT_AMBULATORY_CARE_PROVIDER_SITE_OTHER): Payer: Medicare Other | Admitting: "Endocrinology

## 2018-08-01 VITALS — BP 122/74 | HR 76 | Ht 64.57 in | Wt 143.2 lb

## 2018-08-01 DIAGNOSIS — E1043 Type 1 diabetes mellitus with diabetic autonomic (poly)neuropathy: Secondary | ICD-10-CM | POA: Diagnosis not present

## 2018-08-01 DIAGNOSIS — R000 Tachycardia, unspecified: Secondary | ICD-10-CM

## 2018-08-01 DIAGNOSIS — K3184 Gastroparesis: Secondary | ICD-10-CM

## 2018-08-01 DIAGNOSIS — I10 Essential (primary) hypertension: Secondary | ICD-10-CM | POA: Diagnosis not present

## 2018-08-01 DIAGNOSIS — E10649 Type 1 diabetes mellitus with hypoglycemia without coma: Secondary | ICD-10-CM

## 2018-08-01 DIAGNOSIS — R634 Abnormal weight loss: Secondary | ICD-10-CM

## 2018-08-01 DIAGNOSIS — E1042 Type 1 diabetes mellitus with diabetic polyneuropathy: Secondary | ICD-10-CM

## 2018-08-01 DIAGNOSIS — E1143 Type 2 diabetes mellitus with diabetic autonomic (poly)neuropathy: Secondary | ICD-10-CM | POA: Diagnosis not present

## 2018-08-01 DIAGNOSIS — E1065 Type 1 diabetes mellitus with hyperglycemia: Secondary | ICD-10-CM

## 2018-08-01 DIAGNOSIS — E063 Autoimmune thyroiditis: Secondary | ICD-10-CM

## 2018-08-01 DIAGNOSIS — E049 Nontoxic goiter, unspecified: Secondary | ICD-10-CM | POA: Diagnosis not present

## 2018-08-01 DIAGNOSIS — K141 Geographic tongue: Secondary | ICD-10-CM | POA: Diagnosis not present

## 2018-08-01 DIAGNOSIS — R140 Abdominal distension (gaseous): Secondary | ICD-10-CM | POA: Diagnosis not present

## 2018-08-01 DIAGNOSIS — E1029 Type 1 diabetes mellitus with other diabetic kidney complication: Secondary | ICD-10-CM

## 2018-08-01 DIAGNOSIS — R809 Proteinuria, unspecified: Secondary | ICD-10-CM

## 2018-08-01 DIAGNOSIS — IMO0001 Reserved for inherently not codable concepts without codable children: Secondary | ICD-10-CM

## 2018-08-01 LAB — POCT GLUCOSE (DEVICE FOR HOME USE): POC GLUCOSE: 89 mg/dL (ref 70–99)

## 2018-08-01 LAB — POCT GLYCOSYLATED HEMOGLOBIN (HGB A1C): HEMOGLOBIN A1C: 8.9 % — AB (ref 4.0–5.6)

## 2018-08-01 NOTE — Progress Notes (Signed)
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  HISTORY OF PRESENT ILLNESS:   [name] is a 33 y.o. Caucasian young man.  [name] was accompanied by his friend, [name].       1. I first consulted on [name] when he was admitted to the Pediatric Ward at the [facility] in May 2007.  A. The patient was diagnosed with T1DM at age 64 in about 2001 or 2002. We have few details about his medical care until 2007. We know that in about 2006 he saw a pediatric endocrinologist at [facility], [facility], [facility]. Unfortunately, the family chose not to return to see the endocrinologist. In about April of 2007, his primary care provider, [name] of [facility], started the patient on an insulin pump. Unfortunately, neither the patient nor the family were capable of utilizing the pump's technology effectively.   B. On 10/02/2005 the patient was admitted to the [facility] for poorly controlled type 1 diabetes. I consulted on the patient at that time. 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 He did not have the insight or intelligence to perform the daily tasks of diabetes self-care independently or to utilize the insulin pump. It also appeared that there was not a great deal of adult supervision and support within his family. We tried to teach him and his family how to follow a multiple daily injection of insulin plan with Lantus and Novolog insulin, but that attempt failed rapidly. I changed his  regimen to Novolog Mix 70/30 insulin taken twice daily. He was to take 26 units each morning at breakfast and 14 units each evening at supper. I also gave him a sliding scale for Humalog lispro with different doses of insulin to be used at breakfast, lunch, supper, and bedtime in addition to his 70/30 insulin when his family members were available to check the blood sugars and give the additional insulin. The patient's family members agreed to supervise his care.  2. Unfortunately, the patient has frequently not had the adult supervision and support that he needed. In the last 12 years, the patient has been scheduled to return to our clinic for follow up visits approximately every 3 months, but has actually only returned to our clinic for follow up visits 1-4 times per year, although the family has done much better in the past three years. In 2012 he had 3 visits and was admitted once. In 2013 he had one admission in December, followed by a clinic visit later that month. In 2014 he had 4 clinic visits. In 2015 he had two clinic visits. In fairness, however, I should note that I was unavailable to see him for two months in 2015 due to my severe leg injury and post-operative recuperation. He had 4 visits in 2016 and 4 visits in 2017, but in 2018 he only had 3 visits. Today is his fourth visit in 2019. Since moving in with his sister and her family in January 2019, the adult supervision and support in [place]' life have substantially improved.  3. During the past 12 years [name] has had multiple medical problems:  A. T1DM: His hemoglobin  A1c values have been mostly in the 9.0-14.0% range. He has the following complications of diabetes: diabetic angiopathy, peripheral neuropathy, autonomic neuropathy, inappropriate sinus tachycardia, gastroparesis and postprandial abdominal bloating, chronic constipation, urinary microalbuminuria, and occasional but significant hypoglycemia. Due to health insurance restrictions he has  taken Novolog 70/30 Mix insulin at some times and Humalog 75/25 Mix insulin at other times.   B. Autoimmune hypothyroidism: In November 2007, the patient was diagnosed with hypothyroidism secondary to Hashimoto's disease and was started on Synthroid at a dose of 25 mcg per day.   C. Hypertension: In November 2008, he was diagnosed with hypertension and started on lisinopril, 5 mg per day.   D. Hypercholesterolemia: In July 2014 he was diagnosed with hypercholesterolemia and started on atorvastatin, 10 mg/day.  E. Noncompliance/medical neglect/inadequate medical supervision: Unfortunately, [name] has frequently missed BG checks, doses of insulin, and doses of his oral medications. Parental/family supervision and support have varied in consistency and in quality throughout these years. However, since [name] has been living with [name] and her family, the supervision and support have been much better.  4. [name]' last [facility] clinic visit was on 02/23/18.  At that visit we reduced his Humalog Mix 75/25 insulin doses to 28 units in the mornings and 26 units in the evenings.    A. In the interim he has been healthy.   B. Since his last visit he has not had many low BGs. He has been trying to eat better, but has not been exercising much. He continues on his morning insulin dose of 28 units and his evening dose of 26 units. His dentures are working well.  He admits that he still sometimes forgets to take his insulin.   C. BG control has still been quite variable. He admits that he has been checking some BGs before meals, but others post-prandially. He can't remember which is which.  [name]' sister, [name], now supervises his care in her home. [name] listens to her more about taking his insulin. Providing adequate adult supervision for [name] is going much better.    E. He now has both Medicaid and Medicare coverage, however, [name] Medicaid will no longer pay for medications. He now receives his insulins and DM supplies  through [facility].    F.  He is not doing much itching and scratching.   G. He is supposed to be taking 25 mcg of Synthroid daily, 10 mg of lisinopril daily, and 10 mg of atorvastatin daily. He thinks that he does not miss too many doses.   5. Pertinent Review of Systems:  Constitutional: The patient feels "good overall". He says that he is doing better with his irritability, depression, and willingness to cooperate with his DM care plan, but some days go better than others.   However, when he becomes hypoglycemic or extremely hyperglycemic, he can still be very irritable and sometimes even combative.  Eyes: Vision is a little better now with his glasses. He is legally blind. He had his annual eye exam in October or November 2019. He was told that his diabetes is not interfering with his eyes.  Neck: The patient has no complaints of anterior neck swelling, soreness, tenderness, pressure, discomfort, or difficulty swallowing.  Heart: The patient has no complaints of palpitations, irregular heat beats, chest pain, or chest pressure. Gastrointestinal: He is no longer constipated and does not have bloating after meals. The patient has no other complaints of excessive hunger, acid reflux, stomach aches or pains, diarrhea, or  constipation. Legs: Muscle mass and strength seem normal. There are no complaints of numbness, tingling, burning, or pain. No edema is noted. Feet: [name] has not had any recent tingling in his feet. He says he no longer picks at his toe nails. There are no recent complaints of numbness, tingling, burning, or pain. No edema is noted. GU: He rarely has nocturia. Hypoglycemia: He has not had many low BGs in the past month.     .    6. BG meter review: Some of his BGs are pre-prandial, but others are post-prandial, but he does not know which are which.  He checks BGs from 2-5 times per day, average 2.8 times per day, compared with 4.5 times per day at his last visit. Average BG  is 297. During the first two weeks of this past month his average BG was 323.7. During the most recent two weeks the average was 272.4. [name] admits that he missed more doses of insulin earlier in the month, but not too many recently. BG range is 41-590. In the past 4 weeks he has had 9 BGs <80. All but one of these low BGs occurred late at night or in the early morning hours.  Many of his higher BGs occurred after overtreatment of low BGs. He also missed at least 7 dose of insulin, mostly in the first two weeks of the past month.   PAST MEDICAL, FAMILY, AND SOCIAL HISTORY:  Past Medical History:  Diagnosis Date  . Angiopathy, diabetic (HCC)   . Diabetes mellitus    Type 1, diagnosed age 53, with frequent DKA admissions, difficult to control due to MR  . Diabetic nephropathy (HCC)    Started on Lisinopril 5 mg  . Diabetic peripheral neuropathy (HCC)   . Dysmorphic features   . Goiter   . Hypertension   . Hypoglycemia associated with diabetes (HCC)   . Hypothyroidism   . Impaired cognition    mention of mental retardation  . Mental retardation   . Moderate or severe vision impairment, both eyes, impairment level not further specified   . 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 Thyroiditis, autoimmune     Family History  Problem Relation Age of Onset  . Vision loss Father   . Retinitis pigmentosa Mother   . Diabetes Maternal Grandmother        AODM  . Diabetes Paternal Grandmother        AODM  . Diabetes Cousin        Second cousin has juvenile-onset DM.     Current Outpatient Medications:  .  atorvastatin (LIPITOR) 10 MG tablet, TAKE 1 TABLET (10 MG TOTAL) BY MOUTH DAILY., Disp: 90 tablet, Rfl: 0 .  glucose blood (ONETOUCH VERIO) test strip, USE TO CHECK BLOOD SUGAR 6 TIMES DAILY AS DIRECTED, Disp: 200 each, Rfl: 5 .  Insulin Lispro Prot & Lispro (HUMALOG MIX 75/25 KWIKPEN) (75-25) 100 UNIT/ML Kwikpen, INJECT 34 UNITS OF INSULIN IN THE MORNING AND 20 UNITS IN  THE EVENING, Disp: 15 mL, Rfl: 5 .  lisinopril (PRINIVIL,ZESTRIL) 10 MG tablet, Take 10 mg by mouth daily., Disp: , Rfl:  .  SYNTHROID 25 MCG tablet, TAKE 1 TABLET (25 MCG TOTAL) BY MOUTH DAILY BEFORE BREAKFAST., Disp: 90 tablet, Rfl: 1  Allergies as of 08/01/2018  . (No Known Allergies)    1. Work and Family: [name] remains unemployed. He is still living with his sister, [name], and her family. [name]  stays at home and so can look after [facility]. [name]' friend, [name], also lives in the house. [name] and [name] help each other out and exercise together. Mom has gone back to work.  2. Activities: He has been walking more and riding his bike more recently.  3. Smoking, alcohol, or drugs: No tobacco or alcohol or drugs.  4. Primary Care Provider: None at present  REVIEW OF SYSTEMS: There are no other significant problems involving Jaeshawn's other body systems.   Objective:  Vital Signs:  BP 122/74   Pulse 76   Ht 5' 4.57" (1.64 m)   Wt 143 lb 3.2 oz (65 kg)   BMI 24.15 kg/m    Ht Readings from Last 3 Encounters:  08/01/18 5' 4.57" (1.64 m)  02/23/18 5' 4.41" (1.636 m)  09/26/17 5' 4.57" (1.64 m)   Wt Readings from Last 3 Encounters:  08/01/18 143 lb 3.2 oz (65 kg)  02/23/18 138 lb 6.4 oz (62.8 kg)  11/23/17 133 lb 6.4 oz (60.5 kg)   Body surface area is 1.72 meters squared.  Facility age limit for growth percentiles is 20 years. Facility age limit for growth percentiles is 20 years.  PHYSICAL EXAM:  Constitutional: The patient appears healthy, but his gait is still somewhat wide-based and shuffling due to his visual handicap.  His weight has increased by 5 pounds. He was more engaged and interactive today. He still does not really remember much about checking his BGs and taking his insulins. His thought processes are still very concrete. His insight remains poor. He really does not understand or comprehend much about diabetes, about why he needs to take care of  himself, and about what will happen to him if he does not take care of himself.  Face: The face is somewhat dysmorphic. His face is very similar to mom's face and [name]'s face. Eyes: There is no obvious arcus or proptosis. His eyes are slightly dry.  Mouth: The oropharynx appears normal. His geographic tongue has resolved. He no longer has any maxillary teeth, but he is wearing his new dentures. His mouth is somewhat dry.  Neck: The neck appears to be visibly normal. No carotid bruits are noted. The thyroid gland has shrunk back to normal size today. The consistency of the thyroid gland is normal today. The thyroid gland is not tender to palpation. Lungs: The lungs are clear to auscultation. Air movement is good. Heart: Heart rate is rapid, but rhythm is regular. Heart sounds S1 and S2 are normal. I did not appreciate any pathologic cardiac murmurs. Abdomen: The abdomen is normal in size, but he has more adipose tissue today. Bowel sounds are normal. There is no obvious hepatomegaly, splenomegaly, or other mass effect.  Arms: Muscle size and bulk are normal for age. He has no new areas of excoriation on his arms. The older areas are healing or have healed.  Hands: There is no obvious tremor. Phalangeal and metacarpophalangeal joints are normal. Palmar muscles are normal. Palmar skin is normal. Palmar moisture is also normal. Legs: Muscles appear normal for age. No edema is present. Feet: Feet are normally formed. Dorsalis pedal pulses are faint 1+ bilaterally. PT pulses are 1+ on the right, but faint 1+ on the left. Toe nails are normal today.   Neurologic: Strength is normal for age in both the upper and lower extremities. Muscle tone is normal. Sensation to touch is normal in both the legs and feet.     LAB DATA:   Labs  08/01/18: HbA1c 8.9%, CBG 89  Labs 02/23/18: HbA1c 8.8%, CBG 201; TSH 5.00, free T4 1.1, free T3 3.2;   Labs 11/23/17: CBG 338  Labs 09/26/17: HbA1c 10.9%, CBG 364, urine  glucose 2000, urine ketones negative  Labs 06/27/17: HbA1c 9.0%, CBG 134; TSH 1.49, free T4 1.2, free T3 3.1; CMP normal, except for glucose of 213; cholesterol 134, triglycerides 79, HDL 53, LDL 65; urinary microalbumin/creatinine ratio was 748   Labs 12/31/15: HbA1c 11.7%, CBG 145  Labs 09/29/16: HbA1c 10.2%, CBG 70  Labs 06/01/16: HbA1c >14%. CBG 288; TSH 2.65, free T4 1.2, free T3 2.7; cholesterol 201, triglycerides 106, HDL 54, LDL 127; CMP normal except glucose 130 and albumin 3.5.   Labs 01/05/16: HbA1c 11.0%  Labs 11/04/15: HbA1c 10.5%  Labs 09/12/15: HbA1c 10.3%; CMP normal except for glucose 149 and sodium 133; cholesterol 190, triglycerides 90, HDL 55, LDL 117; urinary microalbumin/creatinine ratio 190; TSH 3.09, free T4 1.3, free T3 3.8  Labs 07/16/15: HbA1c 10.6%, Urine dipstick shows trace ketones  Labs 04/09/15: HbA1c 10.4%  Labs 09/09/14: HbA1c 11.9%; TSH 1.822, free T4 1.02, and free T3 2.6; CMP normal except for glucose of 273; cholesterol 174, triglycerides 181, HDL 46, LDL 92; urinary microalbumin/creatinine ratio 139.2  Labs 06/19/14: HbA1c 10.7%  Labs 05/12/14: CMP normal  Labs 02/19/14: HbA1c 10.2%  Labs 07/31/13: HbA1c 8.5%; CMP normal, except glucose 269; microalbumin/creatinine ratio 51.6; TSH 1.885, free T4 1.31, free T3 3.4   Labs 7/018/14: CMP normal, except glucose of 14; cholesterol 202, triglycerides 85, HDL 58, LDL 127; urinary microalbumin/creatinine ratio 51.9; TSH 2.993, free T4 1.32, free T3 2.8  05/10/12: Creatinine 0.62, TSH 1.025,            Assessment and Plan:   ASSESSMENT:  1-2. Diabetes mellitus/hypoglycemia:   A. The patient's HbA1c is a bit higher today, but his BGs are more variable. Some of his higher BGs are due to overtreatment of low BGs. Other high BGs are due to missing insulin doses.   B. With [name]'s supervision, [name] has been eating more healthy. He has not been exercising much during the Winter.   C He still sometimes misses  insulin doses if he is not directly supervised, but far fewer than before. At his last visit I asked [name] to have [name] test his BGs, take his insulins, and take his oral meds directly in front of her. I also asked her to try to limit [name]'s late night snacks.   D. He has had 9 BGs <80 this month. Most have occurred during the night or in the evenings. Several of the low nocturnal BGs could have been prevented if he had a programmed bedtime snack if the bedtime BGs were <200. Since these changes are unlikely to happen, however, it makes sense to reduce his evening dose of insulin again.   Fawn Kirk needs continuing adult supervision.  3. Hypertension: Blood pressure is normal today.  4. Peripheral neuropathy: This problem has improved and is not evident today, but will certainly recur if he does not get his BGs under better control.   5. Angiopathy: His angiopathy is still fairly severe, but is improving. If he exercises on a regular basis and controls his BGs, the blood flow in his legs and feet will likely improve further. 6. Goiter/thyroiditis: Thyroid gland has decreased in size again. The process of waxing and waning of thyroid gland size is c/w evolving Hashimoto's thyroiditis.   7. Hypothyroid: He was mid-range euthyroid in  April 2016, but borderline low in April 2017. His TSH in January 2018 was better, but still above the goal range of 1.0-2.0. His TSH in February 2019 was within the goal range. His TFTs in October 2019 were low, largely due to missing Synthroid doses. He needs to take his Synthroid daily. We need to repeat his TFTs today.  8. Autonomic neuropathy, tachycardia, bloating, and gastroparesis: His heart rate is lower and he is not having any GI symptoms.   9. Combined hyperlipidemia: His lipids in February 2019 were normal on his current dose of atorvastatin.  10. Weight loss, unintentional: This process had resolved with him taking more insulin and more food, but can recur when he  is not as compliant with his DM care plan.    11. Dehydration: He is mildly dehydrated today.  12. Medical neglect: [name]' sister and her husband are really trying to supervise [name], although he often makes it difficult for them to do so. Overall [name]' DM care is much better with [name]'s supervision.  13. 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  14. Microalbuminuria/macroalbuminuria: His microalbumin/creatinine ratio in February 2019 was the highest that it has been in the past four nears. He needs better control of both his BGs and his BPs. He still needs to take his lisinopril every day.    PLAN:  1. Diagnostic: HbA1c and CBG today. Obtain annual surveillance labs even in a non-fasting state. Reviewed BG printout.  2. Therapeutic:   A. Continue  the Humalog Mix 75/25 insulin dose in the mornings of 28 units. Reduce the dinner insulin dose to 24 units. However, if the morning BG is <100, reduce the morning insulin dose to 26 units. If the dinner BG is <100, reduce the dinner insulin dose to 23 units. If the BG at bedtime is <200, follow the Small column bedtime snack plan.    B. Take oral medications once a day, at whatever time the family can fit in the doses.   C. I asked that [name] to directly supervise his BG checks, insulin dosing, and oral meds.  3. Patient education: We discussed all of the above. I asked [name] again to please cooperate with his family members. 4. Follow-up: 3 months. Ensure that [name] can attend.   Level of Service: This visit lasted in excess of 70 minutes. More than 50% of the visit was devoted to counseling.  Molli Knock, MD 08/01/2018 9:46 AM

## 2018-08-01 NOTE — Patient Instructions (Addendum)
Follow up visit in 3 months. Please continue the morning dose of 28 units of Humal0g 75/25 insulin mix, but please reduce the dinner dose of insulin to 24 units.

## 2018-08-02 LAB — LIPID PANEL
Cholesterol: 194 mg/dL (ref <200)
HDL: 52 mg/dL (ref >=40)
LDL Cholesterol (Calc): 124 mg/dL (calc) — ABNORMAL HIGH
Non-HDL Cholesterol (Calc): 142 mg/dL (calc) — ABNORMAL HIGH (ref <130)
TRIGLYCERIDES: 83 mg/dL (ref <150)
Total CHOL/HDL Ratio: 3.7 (calc) (ref <5.0)

## 2018-08-02 LAB — COMPREHENSIVE METABOLIC PANEL
AG Ratio: 1.3 (calc) (ref 1.0–2.5)
ALT: 13 U/L (ref 9–46)
AST: 22 U/L (ref 10–40)
Albumin: 3.7 g/dL (ref 3.6–5.1)
Alkaline phosphatase (APISO): 67 U/L (ref 36–130)
BUN: 12 mg/dL (ref 7–25)
CO2: 29 mmol/L (ref 20–32)
Calcium: 9.5 mg/dL (ref 8.6–10.3)
Chloride: 105 mmol/L (ref 98–110)
Creat: 0.96 mg/dL (ref 0.60–1.35)
GLOBULIN: 2.9 g/dL (ref 1.9–3.7)
Glucose, Bld: 77 mg/dL (ref 65–99)
Potassium: 4.5 mmol/L (ref 3.5–5.3)
Sodium: 142 mmol/L (ref 135–146)
Total Bilirubin: 0.5 mg/dL (ref 0.2–1.2)
Total Protein: 6.6 g/dL (ref 6.1–8.1)

## 2018-08-02 LAB — T4, FREE: Free T4: 1 ng/dL (ref 0.8–1.8)

## 2018-08-02 LAB — MICROALBUMIN / CREATININE URINE RATIO
Creatinine, Urine: 185 mg/dL (ref 20–320)
Microalb Creat Ratio: 1422 mcg/mg creat — ABNORMAL HIGH (ref <30)
Microalb, Ur: 263.1 mg/dL

## 2018-08-02 LAB — T3, FREE: T3, Free: 2.7 pg/mL (ref 2.3–4.2)

## 2018-08-07 ENCOUNTER — Encounter (INDEPENDENT_AMBULATORY_CARE_PROVIDER_SITE_OTHER): Payer: Self-pay | Admitting: *Deleted

## 2018-09-08 ENCOUNTER — Other Ambulatory Visit (INDEPENDENT_AMBULATORY_CARE_PROVIDER_SITE_OTHER): Payer: Self-pay | Admitting: "Endocrinology

## 2018-09-23 ENCOUNTER — Other Ambulatory Visit (INDEPENDENT_AMBULATORY_CARE_PROVIDER_SITE_OTHER): Payer: Self-pay | Admitting: "Endocrinology

## 2018-09-23 DIAGNOSIS — IMO0001 Reserved for inherently not codable concepts without codable children: Secondary | ICD-10-CM

## 2018-09-23 DIAGNOSIS — E1065 Type 1 diabetes mellitus with hyperglycemia: Principal | ICD-10-CM

## 2018-11-02 ENCOUNTER — Ambulatory Visit (INDEPENDENT_AMBULATORY_CARE_PROVIDER_SITE_OTHER): Payer: Medicare Other | Admitting: "Endocrinology

## 2018-11-05 ENCOUNTER — Observation Stay
Admission: EM | Admit: 2018-11-05 | Discharge: 2018-11-06 | Disposition: A | Discharge status: Home / Self Care | Payer: Medicare Other | Attending: Internal Medicine | Admitting: Internal Medicine

## 2018-11-05 ENCOUNTER — Observation Stay: Payer: Medicare Other

## 2018-11-05 ENCOUNTER — Other Ambulatory Visit: Payer: Self-pay

## 2018-11-05 ENCOUNTER — Emergency Department: Payer: Medicare Other

## 2018-11-05 DIAGNOSIS — E039 Hypothyroidism, unspecified: Secondary | ICD-10-CM | POA: Diagnosis not present

## 2018-11-05 DIAGNOSIS — E063 Autoimmune thyroiditis: Secondary | ICD-10-CM | POA: Insufficient documentation

## 2018-11-05 DIAGNOSIS — Z794 Long term (current) use of insulin: Secondary | ICD-10-CM | POA: Diagnosis not present

## 2018-11-05 DIAGNOSIS — E1021 Type 1 diabetes mellitus with diabetic nephropathy: Secondary | ICD-10-CM | POA: Insufficient documentation

## 2018-11-05 DIAGNOSIS — R079 Chest pain, unspecified: Secondary | ICD-10-CM | POA: Diagnosis present

## 2018-11-05 DIAGNOSIS — I214 Non-ST elevation (NSTEMI) myocardial infarction: Secondary | ICD-10-CM

## 2018-11-05 DIAGNOSIS — E1040 Type 1 diabetes mellitus with diabetic neuropathy, unspecified: Secondary | ICD-10-CM | POA: Insufficient documentation

## 2018-11-05 DIAGNOSIS — F70 Mild intellectual disabilities: Secondary | ICD-10-CM | POA: Insufficient documentation

## 2018-11-05 DIAGNOSIS — Z79899 Other long term (current) drug therapy: Secondary | ICD-10-CM | POA: Insufficient documentation

## 2018-11-05 DIAGNOSIS — E785 Hyperlipidemia, unspecified: Secondary | ICD-10-CM | POA: Diagnosis not present

## 2018-11-05 DIAGNOSIS — H3552 Pigmentary retinal dystrophy: Secondary | ICD-10-CM | POA: Diagnosis not present

## 2018-11-05 DIAGNOSIS — R7989 Other specified abnormal findings of blood chemistry: Secondary | ICD-10-CM | POA: Diagnosis not present

## 2018-11-05 DIAGNOSIS — E1165 Type 2 diabetes mellitus with hyperglycemia: Secondary | ICD-10-CM | POA: Diagnosis not present

## 2018-11-05 DIAGNOSIS — Z79890 Hormone replacement therapy (postmenopausal): Secondary | ICD-10-CM | POA: Diagnosis not present

## 2018-11-05 DIAGNOSIS — I10 Essential (primary) hypertension: Secondary | ICD-10-CM | POA: Diagnosis not present

## 2018-11-05 DIAGNOSIS — Z1159 Encounter for screening for other viral diseases: Secondary | ICD-10-CM | POA: Diagnosis not present

## 2018-11-05 DIAGNOSIS — R61 Generalized hyperhidrosis: Secondary | ICD-10-CM | POA: Diagnosis not present

## 2018-11-05 DIAGNOSIS — R457 State of emotional shock and stress, unspecified: Secondary | ICD-10-CM | POA: Diagnosis not present

## 2018-11-05 DIAGNOSIS — I249 Acute ischemic heart disease, unspecified: Principal | ICD-10-CM | POA: Insufficient documentation

## 2018-11-05 DIAGNOSIS — E119 Type 2 diabetes mellitus without complications: Secondary | ICD-10-CM | POA: Diagnosis not present

## 2018-11-05 DIAGNOSIS — Z20828 Contact with and (suspected) exposure to other viral communicable diseases: Secondary | ICD-10-CM | POA: Diagnosis not present

## 2018-11-05 DIAGNOSIS — R52 Pain, unspecified: Secondary | ICD-10-CM | POA: Diagnosis not present

## 2018-11-05 LAB — TROPONIN I
Troponin I: 0.35 ng/mL (ref <0.03)
Troponin I: 0.33 ng/mL (ref <0.03)
Troponin I: 0.35 ng/mL (ref <0.03)

## 2018-11-05 LAB — BASIC METABOLIC PANEL
Anion gap: 10 (ref 5–15)
BUN: 11 mg/dL (ref 6–20)
CO2: 24 mmol/L (ref 22–32)
Calcium: 9 mg/dL (ref 8.9–10.3)
Chloride: 103 mmol/L (ref 98–111)
Creatinine, Ser: 0.97 mg/dL (ref 0.61–1.24)
GFR calc Af Amer: >60 mL/min (ref >60)
GFR calc non Af Amer: >60 mL/min (ref >60)
Glucose, Bld: 336 mg/dL — ABNORMAL HIGH (ref 70–99)
Potassium: 4.3 mmol/L (ref 3.5–5.1)
Sodium: 137 mmol/L (ref 135–145)

## 2018-11-05 LAB — GLUCOSE, CAPILLARY
Glucose-Capillary: 342 mg/dL — ABNORMAL HIGH (ref 70–99)
Glucose-Capillary: 359 mg/dL — ABNORMAL HIGH (ref 70–99)
Glucose-Capillary: 59 mg/dL — ABNORMAL LOW (ref 70–99)
Glucose-Capillary: 127 mg/dL — ABNORMAL HIGH (ref 70–99)

## 2018-11-05 LAB — CBC
HCT: 45.5 % (ref 39.0–52.0)
Hemoglobin: 15.7 g/dL (ref 13.0–17.0)
MCH: 30.7 pg (ref 26.0–34.0)
MCHC: 34.5 g/dL (ref 30.0–36.0)
MCV: 88.9 fL (ref 80.0–100.0)
Platelets: 221 10*3/uL (ref 150–400)
RBC: 5.12 MIL/uL (ref 4.22–5.81)
RDW: 12.2 % (ref 11.5–15.5)
WBC: 6.3 10*3/uL (ref 4.0–10.5)
nRBC: 0 % (ref 0.0–0.2)

## 2018-11-05 LAB — HEPARIN LEVEL (UNFRACTIONATED): Heparin Unfractionated: <0.1 IU/mL — ABNORMAL LOW (ref 0.30–0.70)

## 2018-11-05 LAB — PROTIME-INR
INR: 1 (ref 0.8–1.2)
Prothrombin Time: 12.6 seconds (ref 11.4–15.2)

## 2018-11-05 LAB — APTT: aPTT: 28 seconds (ref 24–36)

## 2018-11-05 MED ORDER — HEPARIN (PORCINE) 25000 UT/250ML-% IV SOLN
1150.0000 [IU]/h | INTRAVENOUS | Status: DC
  Administered 2018-11-05: 700 [IU]/h via INTRAVENOUS
  Administered 2018-11-06: 14:00:00 1150 [IU]/h via INTRAVENOUS
  Filled 2018-11-05 (×2): qty 250

## 2018-11-05 MED ORDER — HEPARIN BOLUS VIA INFUSION
3000.0000 [IU] | Freq: Once | INTRAVENOUS | Status: AC
  Administered 2018-11-05: 3000 [IU] via INTRAVENOUS
  Filled 2018-11-05: qty 3000

## 2018-11-05 MED ORDER — ASPIRIN 81 MG PO CHEW
324.0000 mg | CHEWABLE_TABLET | Freq: Once | ORAL | Status: AC
  Administered 2018-11-05: 11:00:00 324 mg via ORAL
  Filled 2018-11-05: qty 4

## 2018-11-05 MED ORDER — PNEUMOCOCCAL VAC POLYVALENT 25 MCG/0.5ML IJ INJ: 0.5000 mL | INJECTION | INTRAMUSCULAR | Status: DC

## 2018-11-05 MED ORDER — LISINOPRIL 10 MG PO TABS
10.0000 mg | ORAL_TABLET | Freq: Every day | ORAL | Status: DC
  Administered 2018-11-06: 10 mg via ORAL
  Filled 2018-11-05 (×2): qty 1

## 2018-11-05 MED ORDER — NITROGLYCERIN 0.4 MG SL SUBL: 0.4000 mg | SUBLINGUAL_TABLET | SUBLINGUAL | Status: DC | PRN

## 2018-11-05 MED ORDER — ATORVASTATIN CALCIUM 10 MG PO TABS
10.0000 mg | ORAL_TABLET | Freq: Every day | ORAL | Status: DC
  Administered 2018-11-06: 10 mg via ORAL
  Filled 2018-11-05: qty 1

## 2018-11-05 MED ORDER — INSULIN GLARGINE 100 UNIT/ML ~~LOC~~ SOLN
12.0000 [IU] | Freq: Every day | SUBCUTANEOUS | Status: DC
  Administered 2018-11-05 – 2018-11-06 (×2): 12 [IU] via SUBCUTANEOUS
  Filled 2018-11-05 (×3): qty 0.12

## 2018-11-05 MED ORDER — ACETAMINOPHEN 325 MG PO TABS: 650.0000 mg | ORAL_TABLET | ORAL | Status: DC | PRN

## 2018-11-05 MED ORDER — ONDANSETRON HCL 4 MG/2ML IJ SOLN
4.0000 mg | Freq: Four times a day (QID) | INTRAMUSCULAR | Status: DC | PRN
  Administered 2018-11-05: 4 mg via INTRAVENOUS
  Filled 2018-11-05: qty 2

## 2018-11-05 MED ORDER — SODIUM CHLORIDE 0.9 % IV SOLN: 250.0000 mL | INTRAVENOUS | Status: DC | PRN

## 2018-11-05 MED ORDER — ASPIRIN 81 MG PO CHEW
324.0000 mg | CHEWABLE_TABLET | ORAL | Status: AC
  Administered 2018-11-05: 324 mg via ORAL
  Filled 2018-11-05: qty 4

## 2018-11-05 MED ORDER — INSULIN ASPART 100 UNIT/ML ~~LOC~~ SOLN
0.0000 [IU] | Freq: Three times a day (TID) | SUBCUTANEOUS | Status: DC
  Administered 2018-11-05: 20 [IU] via SUBCUTANEOUS
  Administered 2018-11-06: 15 [IU] via SUBCUTANEOUS
  Filled 2018-11-05 (×3): qty 1

## 2018-11-05 MED ORDER — INSULIN ASPART 100 UNIT/ML ~~LOC~~ SOLN: 0.0000 [IU] | Freq: Every day | SUBCUTANEOUS | Status: DC

## 2018-11-05 MED ORDER — IOHEXOL 350 MG/ML SOLN
75.0000 mL | Freq: Once | INTRAVENOUS | Status: AC | PRN
  Administered 2018-11-05: 75 mL via INTRAVENOUS

## 2018-11-05 MED ORDER — INSULIN ASPART 100 UNIT/ML ~~LOC~~ SOLN
4.0000 [IU] | Freq: Three times a day (TID) | SUBCUTANEOUS | Status: DC
  Administered 2018-11-05 – 2018-11-06 (×2): 4 [IU] via SUBCUTANEOUS
  Filled 2018-11-05 (×3): qty 1

## 2018-11-05 MED ORDER — HEPARIN BOLUS VIA INFUSION
1800.0000 [IU] | Freq: Once | INTRAVENOUS | Status: AC
  Administered 2018-11-05: 1800 [IU] via INTRAVENOUS
  Filled 2018-11-05: qty 1800

## 2018-11-05 MED ORDER — SODIUM CHLORIDE 0.9% FLUSH: 3.0000 mL | INTRAVENOUS | Status: DC | PRN

## 2018-11-05 MED ORDER — ASPIRIN 300 MG RE SUPP: 300.0000 mg | RECTAL | Status: AC

## 2018-11-05 MED ORDER — LEVOTHYROXINE SODIUM 25 MCG PO TABS
25.0000 ug | ORAL_TABLET | Freq: Every day | ORAL | Status: DC
  Administered 2018-11-06: 25 ug via ORAL
  Filled 2018-11-05: qty 1

## 2018-11-05 MED ORDER — ASPIRIN EC 81 MG PO TBEC
81.0000 mg | DELAYED_RELEASE_TABLET | Freq: Every day | ORAL | Status: DC
  Administered 2018-11-06: 81 mg via ORAL
  Filled 2018-11-05 (×2): qty 1

## 2018-11-05 MED ORDER — SODIUM CHLORIDE 0.9% FLUSH
3.0000 mL | Freq: Two times a day (BID) | INTRAVENOUS | Status: DC
  Administered 2018-11-05 – 2018-11-06 (×3): 3 mL via INTRAVENOUS
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
## 2018-11-05 NOTE — ED Notes (Signed)
Troponin 0.35- Dr Jacqualine Code notified

## 2018-11-05 NOTE — ED Notes (Signed)
Pt back to room from CT

## 2018-11-05 NOTE — Progress Notes (Signed)
Hypoglycemic Event  CBG: 59  Treatment: 8 oz juice/soda and sandwich tray  Symptoms: Sweaty and Hungry  Follow-up CBG: Time: 2242 CBG Result: 127  Possible Reasons for Event: Inadequate meal intake  Comments/MD notified: Pt stated he feels better after eating the sandwich tray. Nursing staff will continue to monitor for any changes in patient status.    Earleen Reaper

## 2018-11-05 NOTE — Care Management Obs Status (Signed)
MEDICARE OBSERVATION STATUS NOTIFICATION   Patient Details  Name: [name] WATTENBARGER MRN: [id number] Date of Birth: 11-09-1985   Medicare Observation Status Notification Given:  Yes    Wealthy Danielski A [place], RN 11/05/2018, 1:35 PM

## 2018-11-05 NOTE — ED Triage Notes (Signed)
Started with chest pain this AM.  EMS reports he had a friend pass away recently.  Pain was mid chest and intermittent.  Pt reports sweating when pain was active.

## 2018-11-05 NOTE — Consult Note (Signed)
[facility] for Heparin Indication: chest pain/ACS  No Known Allergies  Patient Measurements: Height: 5\' 4"  (162.6 cm) Weight: 135 lb (61.2 kg) IBW/kg (Calculated) : 59.2 Heparin Dosing Weight: 61.2 kg  Vital Signs: Temp: 98.4 F (36.9 C) (06/14 1042) Temp Source: Oral (06/14 1042) BP: 156/100 (06/14 1045) Pulse Rate: 91 (06/14 1045)  Labs: Recent Labs    11/05/18 1048  HGB 15.7  HCT 45.5  PLT 221  CREATININE 0.97  TROPONINI 0.35*    Estimated Creatinine Clearance: 93.2 mL/min (by C-G formula based on SCr of 0.97 mg/dL).   Medical History: Past Medical History:  Diagnosis Date  . Angiopathy, diabetic ([place])   . Diabetes mellitus    Type 1, diagnosed age 63, with frequent DKA admissions, difficult to control due to MR  . Diabetic nephropathy ([place])    Started on Lisinopril 5 mg  . Diabetic peripheral neuropathy ([place])   . Dysmorphic features   . Goiter   . Hypertension   . Hypoglycemia associated with diabetes ([place])   . Hypothyroidism   . Impaired cognition    mention of mental retardation  . Mental retardation   . Moderate or severe vision impairment, both eyes, impairment level not further specified   . 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 Thyroiditis, autoimmune     Medications:  (Not in a hospital admission)  Scheduled:   Infusions:   PRN: iohexol Anti-infectives (From admission, onward)   None      Assessment: Pharmacy consulted to start heparin for ACS. No DOAC PTA.   Goal of Therapy:  Heparin level 0.3-0.7 units/ml Monitor platelets by anticoagulation protocol: Yes   Plan:  Give 3000 units bolus x 1 Start heparin infusion at 700 units/hr Check anti-Xa level in 6 hours and daily while on heparin Continue to monitor H&H and platelets  Oswald Hillock, PharmD, BCPS 11/05/2018,11:54 AM

## 2018-11-05 NOTE — H&P (Signed)
[facility] [place] [facility]   PATIENT NAME: [name] Galant    MR#:  [id number]  DATE OF BIRTH:  02/15/1986  DATE OF ADMISSION:  11/05/2018  PRIMARY CARE PHYSICIAN: Patient, No Pcp Per   REQUESTING/REFERRING PHYSICIAN:   CHIEF COMPLAINT:   Chief Complaint  Patient presents with  . Chest Pain    HISTORY OF PRESENT ILLNESS: [name] Hammad  is a 33 y.o. male with a known history of type 1 diabetes mellitus, hypertension, hypothyroidism, mild mental retardation, retinitis pigmentosa presented to the emergency room for chest pain.  The chest pain started at 8 AM this morning located in the left-sided chest sharp in nature 5 out of 10 on a scale of 1-10.  No complaints of any shortness of breath.  No nausea or vomiting.  Patient was evaluated in the emergency room troponin was elevated to 0.35.  CTA chest was ordered pending test results.  PAST MEDICAL HISTORY:   Past Medical History:  Diagnosis Date  . Angiopathy, diabetic (HCC)   . Diabetes mellitus    Type 1, diagnosed age 89, with frequent DKA admissions, difficult to control due to MR  . Diabetic nephropathy (HCC)    Started on Lisinopril 5 mg  . Diabetic peripheral neuropathy (HCC)   . Dysmorphic features   . Goiter   . Hypertension   . Hypoglycemia associated with diabetes (HCC)   . Hypothyroidism   . Impaired cognition    mention of mental retardation  . Mental retardation   . Moderate or severe vision impairment, both eyes, impairment level not further specified   . 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 Thyroiditis, autoimmune     PAST SURGICAL HISTORY:  Past Surgical History:  Procedure Laterality Date  . DENTAL SURGERY      SOCIAL HISTORY:  Social History   Tobacco Use  . Smoking status: Never Smoker  . Smokeless tobacco: Never Used  Substance Use Topics  . Alcohol use: No    Comment: Rarely consumes alcohol, perhaps once or twice per year.     FAMILY HISTORY:  Family History  Problem Relation Age of Onset  . Vision loss Father   . Retinitis pigmentosa Mother   . Diabetes Maternal Grandmother        AODM  . Diabetes Paternal Grandmother        AODM  . Diabetes Cousin        Second cousin has juvenile-onset DM.    DRUG ALLERGIES: No Known Allergies  REVIEW OF SYSTEMS:   CONSTITUTIONAL: No fever, fatigue or weakness.  EYES: No blurred or double vision.  EARS, NOSE, AND THROAT: No tinnitus or ear pain.  RESPIRATORY: No cough, shortness of breath, wheezing or hemoptysis.  CARDIOVASCULAR: Has chest pain, no orthopnea, edema.  GASTROINTESTINAL: No nausea, vomiting, diarrhea or abdominal pain.  GENITOURINARY: No dysuria, hematuria.  ENDOCRINE: No polyuria, nocturia,  HEMATOLOGY: No anemia, easy bruising or bleeding SKIN: No rash or lesion. MUSCULOSKELETAL: No joint pain or arthritis.   NEUROLOGIC: No tingling, numbness, weakness.  PSYCHIATRY: No anxiety or depression.   MEDICATIONS AT HOME:  Prior to Admission medications   Medication Sig Start Date End Date Taking? Authorizing Provider  atorvastatin (LIPITOR) 10 MG tablet TAKE 1 TABLET (10 MG TOTAL) BY MOUTH DAILY. 03/29/16  Yes David StallBrennan, Michael J, MD  glucose blood (ONETOUCH VERIO) test strip USE TO CHECK BLOOD SUGAR 6 TIMES DAILY AS DIRECTED Patient taking differently: 3 (  three) times daily.  02/28/18  Yes Sherrlyn Hock, MD  Insulin Lispro Prot & Lispro (HUMALOG MIX 75/25 KWIKPEN) (75-25) 100 UNIT/ML Kwikpen INJECT 34 UNITS OF INSULIN IN THE MORNING AND 20 UNITS IN THE EVENING Patient taking differently: INJECT 28 UNITS OF INSULIN IN THE MORNING AND 24 UNITS IN THE EVENING 09/25/18  Yes Sherrlyn Hock, MD  lisinopril (ZESTRIL) 10 MG tablet TAKE 1 TABLET BY MOUTH EVERY DAY 09/08/18  Yes Sherrlyn Hock, MD  SYNTHROID 25 MCG tablet TAKE 1 TABLET (25 MCG TOTAL) BY MOUTH DAILY BEFORE BREAKFAST. 01/05/16  Yes Sherrlyn Hock, MD      PHYSICAL EXAMINATION:    VITAL SIGNS: Blood pressure (!) 135/93, pulse 82, temperature 98.4 F (36.9 C), temperature source Oral, resp. rate (!) 21, height 5\' 4"  (1.626 m), weight 61.2 kg, SpO2 100 %.  GENERAL:  33 y.o.-year-old patient lying in the bed with no acute distress.  EYES: Pupils equal, round, reactive to light and accommodation. No scleral icterus. Extraocular muscles intact.  HEENT: Head atraumatic, normocephalic. Oropharynx and nasopharynx clear.  NECK:  Supple, no jugular venous distention. No thyroid enlargement, no tenderness.  LUNGS: Normal breath sounds bilaterally, no wheezing, rales,rhonchi or crepitation. No use of accessory muscles of respiration.  CARDIOVASCULAR: S1, S2 normal. No murmurs, rubs, or gallops.  ABDOMEN: Soft, nontender, nondistended. Bowel sounds present. No organomegaly or mass.  EXTREMITIES: No pedal edema, cyanosis, or clubbing.  NEUROLOGIC: Cranial nerves II through XII are intact. Muscle strength 5/5 in all extremities. Sensation intact. Gait not checked.  PSYCHIATRIC: The patient is alert and oriented x 3.  SKIN: No obvious rash, lesion, or ulcer.   LABORATORY PANEL:   CBC Recent Labs  Lab 11/05/18 1048  WBC 6.3  HGB 15.7  HCT 45.5  PLT 221  MCV 88.9  MCH 30.7  MCHC 34.5  RDW 12.2   ------------------------------------------------------------------------------------------------------------------  Chemistries  Recent Labs  Lab 11/05/18 1048  NA 137  K 4.3  CL 103  CO2 24  GLUCOSE 336*  BUN 11  CREATININE 0.97  CALCIUM 9.0   ------------------------------------------------------------------------------------------------------------------ estimated creatinine clearance is 93.2 mL/min (by C-G formula based on SCr of 0.97 mg/dL). ------------------------------------------------------------------------------------------------------------------ No results for input(s): TSH, T4TOTAL, T3FREE, THYROIDAB in the last 72 hours.  Invalid input(s):  FREET3   Coagulation profile No results for input(s): INR, PROTIME in the last 168 hours. ------------------------------------------------------------------------------------------------------------------- No results for input(s): DDIMER in the last 72 hours. -------------------------------------------------------------------------------------------------------------------  Cardiac Enzymes Recent Labs  Lab 11/05/18 1048  TROPONINI 0.35*   ------------------------------------------------------------------------------------------------------------------ Invalid input(s): POCBNP  ---------------------------------------------------------------------------------------------------------------  Urinalysis    Component Value Date/Time   COLORURINE YELLOW 05/12/2014 1159   APPEARANCEUR CLOUDY (A) 05/12/2014 1159   LABSPEC 1.026 05/12/2014 1159   PHURINE 7.0 05/12/2014 1159   GLUCOSEU 500 (A) 05/12/2014 1159   HGBUR NEGATIVE 05/12/2014 1159   BILIRUBINUR SMALL (A) 05/12/2014 1159   KETONESUR 15 (A) 05/12/2014 1159   PROTEINUR 100 (A) 05/12/2014 1159   UROBILINOGEN 1.0 05/12/2014 1159   NITRITE NEGATIVE 05/12/2014 1159   LEUKOCYTESUR NEGATIVE 05/12/2014 1159     RADIOLOGY: Dg Chest 2 View  Result Date: 11/05/2018 CLINICAL DATA:  Acute onset chest pain, diaphoresis, and weakness today. EXAM: CHEST - 2 VIEW COMPARISON:  05/01/2011 FINDINGS: The heart size and mediastinal contours are within normal limits. Both lungs are clear. The visualized skeletal structures are unremarkable. IMPRESSION: Negative.  No active cardiopulmonary disease. Electronically Signed   By: Earle Gell M.D.   On: 11/05/2018  11:30    EKG: Orders placed or performed during the hospital encounter of 11/05/18  . EKG 12-Lead  . EKG 12-Lead  . ED EKG  . ED EKG    IMPRESSION AND PLAN: 33 year old male patient with a known history of type 1 diabetes mellitus, hypertension, hypothyroidism, mild mental  retardation, retinitis pigmentosa presented to the emergency room for chest pain.   -Acute coronary syndrome Admit patient to telemetry Under observation bed Start patient on aspirin Cycle troponin check echocardiogram Cardiology consult Heparin drip for anticoagulation  -Diabetes mellitus type 1 Diabetic diet with sliding scale coverage with insulin  -Hypertension Continue ACE inhibitor  -Hyperlipidemia Continue Lipitor  -DVT prophylaxis On heparin drip for anticoagulation  All the records are reviewed and case discussed with ED provider. Management plans discussed with the patient, family and they are in agreement.  CODE STATUS:Full code Code Status History    Date Active Date Inactive Code Status Order ID Comments User Context   05/10/2012 1651 05/12/2012 1827 Full Code [id number]  Madalyn RobHicks, Tatjana Michelle, RN ED   04/30/2011 1337 05/02/2011 1322 Full Code [id number]  Prince Solianarbone, Estrellita Lagunay, RN Inpatient   Advance Care Planning Activity       TOTAL TIME TAKING CARE OF THIS PATIENT: 52 minutes.    Ihor AustinPavan Ilanna Deihl M.D on 11/05/2018 at 12:24 PM  Between 7am to 6pm - Pager - [phone]  After 6pm go to [email or web address] - password [email or web address]  [facility]  Office  [phone]  CC: Primary care physician; Patient, No Pcp Per

## 2018-11-05 NOTE — ED Notes (Signed)
Report given to Steve, RN

## 2018-11-05 NOTE — ED Notes (Signed)
ED TO INPATIENT HANDOFF REPORT  ED Nurse Name and Phone #: Demetrios Byron [phone]  S Name/Age/Gender [name] 33 y.o. male Room/Bed: ED07A/ED07A  Code Status   Code Status: Prior  Home/SNF/Other Home Patient oriented to: self, place, time and situation Is this baseline? Yes   Triage Complete: Triage complete  Chief Complaint chest pa in  Triage Note Started with chest pain this AM.  EMS reports he had a friend pass away recently.  Pain was mid chest and intermittent.  Pt reports sweating when pain was active.   Allergies No Known Allergies  Level of Care/Admitting Diagnosis ED Disposition    ED Disposition Condition Comment   Admit  [facility] [[id number]]  Level of Care: Telemetry [5]  Covid Evaluation: N/A  Diagnosis: Chest pain [409811][[id number]]  Admitting Physician: Ihor AustinYREDDY, PAVAN [[id number]]  Attending Physician: Ihor AustinPYREDDY, PAVAN [[id number]]  PT Class (Do Not Modify): Observation [104]  PT Acc Code (Do Not Modify): Observation [10022]       B Medical/Surgery History Past Medical History:  Diagnosis Date  . Angiopathy, diabetic (HCC)   . Diabetes mellitus    Type 1, diagnosed age 33, with frequent DKA admissions, difficult to control due to MR  . Diabetic nephropathy (HCC)    Started on Lisinopril 5 mg  . Diabetic peripheral neuropathy (HCC)   . Dysmorphic features   . Goiter   . Hypertension   . Hypoglycemia associated with diabetes (HCC)   . Hypothyroidism   . Impaired cognition    mention of mental retardation  . Mental retardation   . Moderate or severe vision impairment, both eyes, impairment level not further specified   . 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 Thyroiditis, autoimmune    Past Surgical History:  Procedure Laterality Date  . DENTAL SURGERY       A IV Location/Drains/Wounds Patient Lines/Drains/Airways Status   Active Line/Drains/Airways    Name:   Placement date:   Placement time:    Site:   Days:   Peripheral IV 11/05/18 Right Antecubital   11/05/18    1146    Antecubital   less than 1   Peripheral IV 11/05/18 Left Antecubital   11/05/18    1153    Antecubital   less than 1          Intake/Output Last 24 hours  Intake/Output Summary (Last 24 hours) at 11/05/2018 1155 Last data filed at 11/05/2018 1140 Gross per 24 hour  Intake -  Output 300 ml  Net -300 ml    Labs/Imaging Results for orders placed or performed during the hospital encounter of 11/05/18 (from the past 48 hour(s))  CBC     Status: None   Collection Time: 11/05/18 10:48 AM  Result Value Ref Range   WBC 6.3 4.0 - 10.5 K/uL   RBC 5.12 4.22 - 5.81 MIL/uL   Hemoglobin 15.7 13.0 - 17.0 g/dL   HCT 08.645.5 57.839.0 - 46.952.0 %   MCV 88.9 80.0 - 100.0 fL   MCH 30.7 26.0 - 34.0 pg   MCHC 34.5 30.0 - 36.0 g/dL   RDW 62.912.2 52.811.5 - 41.315.5 %   Platelets 221 150 - 400 K/uL   nRBC 0.0 0.0 - 0.2 %    Comment: Performed at [facility], [address] [place], [place] [address]  Basic metabolic panel     Status: Abnormal   Collection Time: 11/05/18 10:48 AM  Result Value  Ref Range   Sodium 137 135 - 145 mmol/L   Potassium 4.3 3.5 - 5.1 mmol/L   Chloride 103 98 - 111 mmol/L   CO2 24 22 - 32 mmol/L   Glucose, Bld 336 (H) 70 - 99 mg/dL   BUN 11 6 - 20 mg/dL   Creatinine, Ser 0.97 0.61 - 1.24 mg/dL   Calcium 9.0 8.9 - 10.3 mg/dL   GFR calc non Af Amer >60 >60 mL/min   GFR calc Af Amer >60 >60 mL/min   Anion gap 10 5 - 15    Comment: Performed at [facility], [address] [place], [place] [address]  Troponin I - ONCE - STAT     Status: Abnormal   Collection Time: 11/05/18 10:48 AM  Result Value Ref Range   Troponin I 0.35 (HH) <0.03 ng/mL    Comment: CRITICAL RESULT CALLED TO, READ BACK BY AND VERIFIED WITH Rhoderick Farrel WALLACE AT 1122 11/05/2018.PMF Performed at [facility], [address] [place], [place] [address]    Dg Chest 2 View  Result Date: 11/05/2018 CLINICAL DATA:  Acute  onset chest pain, diaphoresis, and weakness today. EXAM: CHEST - 2 VIEW COMPARISON:  05/01/2011 FINDINGS: The heart size and mediastinal contours are within normal limits. Both lungs are clear. The visualized skeletal structures are unremarkable. IMPRESSION: Negative.  No active cardiopulmonary disease. Electronically Signed   By: Earle Gell M.D.   On: 11/05/2018 11:30    Pending Labs Unresulted Labs (From admission, onward)    Start     Ordered   11/05/18 1136  Novel Coronavirus,NAA,(SEND-OUT TO REF LAB - TAT 24-48 hrs); Hosp Order  (Asymptomatic Patients Labs)  ONCE - STAT,   STAT    Question:  Rule Out  Answer:  Yes   11/05/18 1135   Signed and Held  HIV antibody (Routine Testing)  Once,   R     Signed and Held   Signed and Held  Troponin I - Now Then Q6H  Now then every 6 hours,   STAT     Signed and Held   Signed and Held  Basic metabolic panel  Tomorrow morning,   R     Signed and Held   Signed and Held  CBC  Tomorrow morning,   R     Signed and Held   Signed and Held  Lipid panel  Tomorrow morning,   R     Signed and Held          Vitals/Pain Today's Vitals   11/05/18 1041 11/05/18 1042 11/05/18 1045 11/05/18 1046  BP:  (!) 156/100 (!) 156/100   Pulse:  89 91   Resp:  15 16   Temp:  98.4 F (36.9 C)    TempSrc:  Oral    SpO2:  100% 100%   Weight:    61.2 kg  Height:    5\' 4"  (1.626 m)  PainSc: 1        Isolation Precautions No active isolations  Medications Medications  iohexol (OMNIPAQUE) 350 MG/ML injection 75 mL (has no administration in time range)  aspirin chewable tablet 324 mg (324 mg Oral Given 11/05/18 1113)    Mobility walks Low fall risk   Focused Assessments Cardiac Assessment Handoff:  Cardiac Rhythm: Normal sinus rhythm Lab Results  Component Value Date   TROPONINI 0.35 ([place]) 11/05/2018   No results found for: DDIMER Does the Patient currently have chest pain? No     R Recommendations:  See Admitting Provider Note  Report given  to:   Additional Notes: Pt mom at bedside d/t pt having a difficult time comprehending

## 2018-11-05 NOTE — Progress Notes (Signed)
Advanced care plan. Purpose of the Encounter: CODE STATUS Parties in Attendance: Patient and family Patient's Decision Capacity: Good Subjective/Patient's story: [name]  is a 33 y.o. male with a known history of type 1 diabetes mellitus, hypertension, hypothyroidism, mild mental retardation, retinitis pigmentosa presented to the emergency room for chest pain.  The chest pain started at 8 AM this morning located in the left-sided chest sharp in nature 5 out of 10 on a scale of 1-10.  No complaints of any shortness of breath.  No nausea or vomiting.  Patient was evaluated in the emergency room troponin was elevated to 0.35.  CTA chest was ordered pending test results. Objective/Medical story Patient needs cardiology work-up which includes serial troponin echocardiogram and possible cardiac cath. Goals of care determination:  Advance care directives goals of care and treatment plan discussed Patient wants everything done which includes CPR, intubation ventilator if the need arises CODE STATUS: Full code Time spent discussing advanced care planning: 16 minutes

## 2018-11-05 NOTE — Progress Notes (Signed)
ANTICOAGULATION CONSULT NOTE - Initial Consult  Pharmacy Consult for Heparin  Indication: chest pain/ACS  No Known Allergies  Patient Measurements: Height: 5\' 6"  (167.6 cm) Weight: 142 lb (64.4 kg) IBW/kg (Calculated) : 63.8 Heparin Dosing Weight:   61.2 kg   Vital Signs: Temp: 98.3 F (36.8 C) (06/14 1644) Temp Source: Oral (06/14 1309) BP: 117/82 (06/14 1644) Pulse Rate: 95 (06/14 1644)  Labs: Recent Labs    11/05/18 1048 11/05/18 1324 11/05/18 1757  HGB 15.7  --   --   HCT 45.5  --   --   PLT 221  --   --   APTT 28  --   --   LABPROT 12.6  --   --   INR 1.0  --   --   HEPARINUNFRC  --   --  <0.10*  CREATININE 0.97  --   --   TROPONINI 0.35* 0.33* 0.35*    Estimated Creatinine Clearance: 100.5 mL/min (by C-G formula based on SCr of 0.97 mg/dL).   Medical History: Past Medical History:  Diagnosis Date  . Angiopathy, diabetic ([place])   . Diabetes mellitus    Type 1, diagnosed age 25, with frequent DKA admissions, difficult to control due to MR  . Diabetic nephropathy ([place])    Started on Lisinopril 5 mg  . Diabetic peripheral neuropathy ([place])   . Dysmorphic features   . Goiter   . Hypertension   . Hypoglycemia associated with diabetes ([place])   . Hypothyroidism   . Impaired cognition    mention of mental retardation  . Mental retardation   . Moderate or severe vision impairment, both eyes, impairment level not further specified   . 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 Thyroiditis, autoimmune     Medications:  Medications Prior to Admission  Medication Sig Dispense Refill Last Dose  . atorvastatin (LIPITOR) 10 MG tablet TAKE 1 TABLET (10 MG TOTAL) BY MOUTH DAILY. 90 tablet 0 11/05/2018 at Unknown time  . glucose blood (ONETOUCH VERIO) test strip USE TO CHECK BLOOD SUGAR 6 TIMES DAILY AS DIRECTED (Patient taking differently: 3 (three) times daily. ) 200 each 5 unknown at unknown  . Insulin Lispro Prot & Lispro (HUMALOG MIX 75/25 KWIKPEN)  (75-25) 100 UNIT/ML Kwikpen INJECT 34 UNITS OF INSULIN IN THE MORNING AND 20 UNITS IN THE EVENING (Patient taking differently: INJECT 28 UNITS OF INSULIN IN THE MORNING AND 24 UNITS IN THE EVENING) 15 mL 5 11/04/2018 at Unknown time  . lisinopril (ZESTRIL) 10 MG tablet TAKE 1 TABLET BY MOUTH EVERY DAY 90 tablet 1 11/05/2018 at Unknown time  . SYNTHROID 25 MCG tablet TAKE 1 TABLET (25 MCG TOTAL) BY MOUTH DAILY BEFORE BREAKFAST. 90 tablet 1 11/04/2018 at Unknown time    Assessment: 6/14:  HL @ 1757 = < 0.1  Goal of Therapy:  Heparin level 0.3-0.7 units/ml Monitor platelets by anticoagulation protocol: Yes   Plan:  Will order Heparin 1800 units IV X 1 bolus and increase drip rate to 950 units/hr. Will recheck HL 6 hrs after rate change.   Subrina Vecchiarelli D 11/05/2018,7:17 PM

## 2018-11-05 NOTE — ED Notes (Signed)
Pt given a urinal.

## 2018-11-06 ENCOUNTER — Observation Stay
Admit: 2018-11-06 | Discharge: 2018-11-06 | Disposition: A | Discharge status: Home / Self Care | Payer: Medicare Other | Attending: Internal Medicine | Admitting: Internal Medicine

## 2018-11-06 ENCOUNTER — Observation Stay: Payer: Medicare Other

## 2018-11-06 DIAGNOSIS — I10 Essential (primary) hypertension: Secondary | ICD-10-CM | POA: Diagnosis not present

## 2018-11-06 DIAGNOSIS — E119 Type 2 diabetes mellitus without complications: Secondary | ICD-10-CM | POA: Diagnosis not present

## 2018-11-06 DIAGNOSIS — I249 Acute ischemic heart disease, unspecified: Secondary | ICD-10-CM | POA: Diagnosis not present

## 2018-11-06 DIAGNOSIS — I200 Unstable angina: Secondary | ICD-10-CM | POA: Diagnosis not present

## 2018-11-06 DIAGNOSIS — E785 Hyperlipidemia, unspecified: Secondary | ICD-10-CM | POA: Diagnosis not present

## 2018-11-06 LAB — BASIC METABOLIC PANEL
Anion gap: 9 (ref 5–15)
BUN: 18 mg/dL (ref 6–20)
CO2: 26 mmol/L (ref 22–32)
Calcium: 8.5 mg/dL — ABNORMAL LOW (ref 8.9–10.3)
Chloride: 105 mmol/L (ref 98–111)
Creatinine, Ser: 1.25 mg/dL — ABNORMAL HIGH (ref 0.61–1.24)
GFR calc Af Amer: >60 mL/min (ref >60)
GFR calc non Af Amer: >60 mL/min (ref >60)
Glucose, Bld: 208 mg/dL — ABNORMAL HIGH (ref 70–99)
Potassium: 3.9 mmol/L (ref 3.5–5.1)
Sodium: 140 mmol/L (ref 135–145)

## 2018-11-06 LAB — NM MYOCAR MULTI W/SPECT W/WALL MOTION / EF
Estimated workload: 1 METS
Exercise duration (min): 1 min
Exercise duration (sec): 0 s
LV dias vol: 31 mL (ref 62–150)
LV sys vol: 10 mL
MPHR: 190 {beats}/min
Peak HR: 136 {beats}/min
Percent HR: 71 %
Rest HR: 107 {beats}/min
SDS: 0
SRS: 2
SSS: 0
TID: 0.89

## 2018-11-06 LAB — HEPARIN LEVEL (UNFRACTIONATED)
Heparin Unfractionated: 0.1 IU/mL — ABNORMAL LOW (ref 0.30–0.70)
Heparin Unfractionated: 0.65 IU/mL (ref 0.30–0.70)
Heparin Unfractionated: <0.1 IU/mL — ABNORMAL LOW (ref 0.30–0.70)

## 2018-11-06 LAB — CBC
HCT: 41.6 % (ref 39.0–52.0)
Hemoglobin: 14.1 g/dL (ref 13.0–17.0)
MCH: 30.8 pg (ref 26.0–34.0)
MCHC: 33.9 g/dL (ref 30.0–36.0)
MCV: 90.8 fL (ref 80.0–100.0)
Platelets: 222 10*3/uL (ref 150–400)
RBC: 4.58 MIL/uL (ref 4.22–5.81)
RDW: 12.5 % (ref 11.5–15.5)
WBC: 8.2 10*3/uL (ref 4.0–10.5)
nRBC: 0 % (ref 0.0–0.2)

## 2018-11-06 LAB — GLUCOSE, CAPILLARY
Glucose-Capillary: 287 mg/dL — ABNORMAL HIGH (ref 70–99)
Glucose-Capillary: 331 mg/dL — ABNORMAL HIGH (ref 70–99)

## 2018-11-06 LAB — LIPID PANEL
Cholesterol: 149 mg/dL (ref 0–200)
HDL: 41 mg/dL (ref >40)
LDL Cholesterol: 92 mg/dL (ref 0–99)
Total CHOL/HDL Ratio: 3.6 RATIO
Triglycerides: 82 mg/dL (ref <150)
VLDL: 16 mg/dL (ref 0–40)

## 2018-11-06 LAB — ECHOCARDIOGRAM COMPLETE
Height: 66 in
Weight: 2270.4 oz

## 2018-11-06 LAB — NOVEL CORONAVIRUS, NAA (HOSP ORDER, SEND-OUT TO REF LAB; TAT 18-24 HRS): SARS-CoV-2, NAA: NOT DETECTED

## 2018-11-06 LAB — TROPONIN I: Troponin I: 0.33 ng/mL (ref <0.03)

## 2018-11-06 MED ORDER — HEPARIN BOLUS VIA INFUSION
1800.0000 [IU] | Freq: Once | INTRAVENOUS | Status: AC
  Administered 2018-11-06: 1800 [IU] via INTRAVENOUS
  Filled 2018-11-06: qty 1800

## 2018-11-06 MED ORDER — TECHNETIUM TC 99M TETROFOSMIN IV KIT
30.0000 | PACK | Freq: Once | INTRAVENOUS | Status: AC | PRN
  Administered 2018-11-06: 12:00:00 30.862 via INTRAVENOUS

## 2018-11-06 MED ORDER — REGADENOSON 0.4 MG/5ML IV SOLN
0.4000 mg | Freq: Once | INTRAVENOUS | Status: AC
  Administered 2018-11-06: 0.4 mg via INTRAVENOUS
  Filled 2018-11-06: qty 5

## 2018-11-06 MED ORDER — REGADENOSON 0.4 MG/5ML IV SOLN: 0.4000 mg | Freq: Once | INTRAVENOUS | Status: AC

## 2018-11-06 MED ORDER — TECHNETIUM TC 99M TETROFOSMIN IV KIT
9.9100 | PACK | Freq: Once | INTRAVENOUS | Status: AC | PRN
  Administered 2018-11-06: 9.91 via INTRAVENOUS

## 2018-11-06 NOTE — Progress Notes (Signed)
ANTICOAGULATION CONSULT NOTE - Initial Consult  Pharmacy Consult for Heparin  Indication: chest pain/ACS  No Known Allergies  Patient Measurements: Height: 5\' 6"  (167.6 cm) Weight: 142 lb (64.4 kg) IBW/kg (Calculated) : 63.8 Heparin Dosing Weight:   61.2 kg   Vital Signs: Temp: 98.1 F (36.7 C) (06/14 2004) Temp Source: Oral (06/14 2004) BP: 111/76 (06/14 2006) Pulse Rate: 102 (06/14 2006)  Labs: Recent Labs    11/05/18 1048 11/05/18 1324 11/05/18 1757 11/06/18 0111  HGB 15.7  --   --  14.1  HCT 45.5  --   --  41.6  PLT 221  --   --  222  APTT 28  --   --   --   LABPROT 12.6  --   --   --   INR 1.0  --   --   --   HEPARINUNFRC  --   --  <0.10* 0.10*  CREATININE 0.97  --   --   --   TROPONINI 0.35* 0.33* 0.35*  --     Estimated Creatinine Clearance: 100.5 mL/min (by C-G formula based on SCr of 0.97 mg/dL).   Medical History: Past Medical History:  Diagnosis Date  . Angiopathy, diabetic ([place])   . Diabetes mellitus    Type 1, diagnosed age 15, with frequent DKA admissions, difficult to control due to MR  . Diabetic nephropathy ([place])    Started on Lisinopril 5 mg  . Diabetic peripheral neuropathy ([place])   . Dysmorphic features   . Goiter   . Hypertension   . Hypoglycemia associated with diabetes ([place])   . Hypothyroidism   . Impaired cognition    mention of mental retardation  . Mental retardation   . Moderate or severe vision impairment, both eyes, impairment level not further specified   . 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 Thyroiditis, autoimmune     Medications:  Medications Prior to Admission  Medication Sig Dispense Refill Last Dose  . atorvastatin (LIPITOR) 10 MG tablet TAKE 1 TABLET (10 MG TOTAL) BY MOUTH DAILY. 90 tablet 0 11/05/2018 at Unknown time  . glucose blood (ONETOUCH VERIO) test strip USE TO CHECK BLOOD SUGAR 6 TIMES DAILY AS DIRECTED (Patient taking differently: 3 (three) times daily. ) 200 each 5 unknown at unknown   . Insulin Lispro Prot & Lispro (HUMALOG MIX 75/25 KWIKPEN) (75-25) 100 UNIT/ML Kwikpen INJECT 34 UNITS OF INSULIN IN THE MORNING AND 20 UNITS IN THE EVENING (Patient taking differently: INJECT 28 UNITS OF INSULIN IN THE MORNING AND 24 UNITS IN THE EVENING) 15 mL 5 11/04/2018 at Unknown time  . lisinopril (ZESTRIL) 10 MG tablet TAKE 1 TABLET BY MOUTH EVERY DAY 90 tablet 1 11/05/2018 at Unknown time  . SYNTHROID 25 MCG tablet TAKE 1 TABLET (25 MCG TOTAL) BY MOUTH DAILY BEFORE BREAKFAST. 90 tablet 1 11/04/2018 at Unknown time    Assessment: 6/14:  HL @ 0086 = < 0.1 6/14:  HL @ 0111 = 0.1  Goal of Therapy:  Heparin level 0.3-0.7 units/ml Monitor platelets by anticoagulation protocol: Yes   Plan:  Will order Heparin 1800 units IV X 1 bolus and increase drip rate to 1150 units/hr. Will recheck HL 6 hrs after rate change.   Paulina Fusi, PharmD, BCPS 11/06/2018 1:36 AM
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
## 2018-11-06 NOTE — Progress Notes (Signed)
Patient has a visitor today (mother). Discussed w/ Therapist, sports. Patient has a history of developmental delay. She is OK to remain with patient for remainder of the shift. Wenda Low [facility]

## 2018-11-06 NOTE — Progress Notes (Signed)
ANTICOAGULATION CONSULT NOTE - Initial Consult  Pharmacy Consult for Heparin  Indication: chest pain/ACS  No Known Allergies  Patient Measurements: Height: 5\' 6"  (167.6 cm) Weight: 141 lb 14.4 oz (64.4 kg) IBW/kg (Calculated) : 63.8 Heparin Dosing Weight:   61.2 kg   Vital Signs: Temp: 98.6 F (37 C) (06/15 0806) Temp Source: Oral (06/15 0806) BP: 123/81 (06/15 0806) Pulse Rate: 84 (06/15 0806)  Labs: Recent Labs    11/05/18 1048 11/05/18 1324  11/05/18 1757 11/06/18 0111 11/06/18 0723 11/06/18 1415  HGB 15.7  --   --   --  14.1  --   --   HCT 45.5  --   --   --  41.6  --   --   PLT 221  --   --   --  222  --   --   APTT 28  --   --   --   --   --   --   LABPROT 12.6  --   --   --   --   --   --   INR 1.0  --   --   --   --   --   --   HEPARINUNFRC  --   --    < > <0.10* 0.10* 0.65 <0.10*  CREATININE 0.97  --   --   --  1.25*  --   --   TROPONINI 0.35* 0.33*  --  0.35* 0.33*  --   --    < > = values in this interval not displayed.    Estimated Creatinine Clearance: 78 mL/min (A) (by C-G formula based on SCr of 1.25 mg/dL (H)).   Medical History: Past Medical History:  Diagnosis Date  . Angiopathy, diabetic (HCC)   . Diabetes mellitus    Type 1, diagnosed age 33, with frequent DKA admissions, difficult to control due to MR  . Diabetic nephropathy (HCC)    Started on Lisinopril 5 mg  . Diabetic peripheral neuropathy (HCC)   . Dysmorphic features   . Goiter   . Hypertension   . Hypoglycemia associated with diabetes (HCC)   . Hypothyroidism   . Impaired cognition    mention of mental retardation  . Mental retardation   . Moderate or severe vision impairment, both eyes, impairment level not further specified   . 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 Thyroiditis, autoimmune     Medications:  Medications Prior to Admission  Medication Sig Dispense Refill Last Dose  . atorvastatin (LIPITOR) 10 MG tablet TAKE 1 TABLET (10 MG TOTAL) BY  MOUTH DAILY. 90 tablet 0 11/05/2018 at Unknown time  . glucose blood (ONETOUCH VERIO) test strip USE TO CHECK BLOOD SUGAR 6 TIMES DAILY AS DIRECTED (Patient taking differently: 3 (three) times daily. ) 200 each 5 unknown at unknown  . Insulin Lispro Prot & Lispro (HUMALOG MIX 75/25 KWIKPEN) (75-25) 100 UNIT/ML Kwikpen INJECT 34 UNITS OF INSULIN IN THE MORNING AND 20 UNITS IN THE EVENING (Patient taking differently: INJECT 28 UNITS OF INSULIN IN THE MORNING AND 24 UNITS IN THE EVENING) 15 mL 5 11/04/2018 at Unknown time  . lisinopril (ZESTRIL) 10 MG tablet TAKE 1 TABLET BY MOUTH EVERY DAY 90 tablet 1 11/05/2018 at Unknown time  . SYNTHROID 25 MCG tablet TAKE 1 TABLET (25 MCG TOTAL) BY MOUTH DAILY BEFORE BREAKFAST. 90 tablet 1 11/04/2018 at Unknown time    Assessment: 6/14:  HL @  1757 = < 0.1 6/14:  HL @ 0111 = 0.1 Heparin 1800 units IV X 1 bolus and increase drip rate to 1150 units/hr 6/15:  HL @ 0723 = 0.65 Patient went for a stress test.   Goal of Therapy:  Heparin level 0.3-0.7 units/ml Monitor platelets by anticoagulation protocol: Yes   Plan:  Heparin started @1351 , level was drawn @1415 . Will continue current rate and reorder HL in 6 hours Daily CBC while on heparin.   Eleonore Chiquito, PharmD, BCPS 11/06/2018 3:29 PM

## 2018-11-06 NOTE — Progress Notes (Signed)
[facility] [place] [facility]   PATIENT NAME: [name]    MR#:  [id number]  DATE OF BIRTH:  04/01/1986  SUBJECTIVE:  CHIEF COMPLAINT:   Chief Complaint  Patient presents with  . Chest Pain  Patient seen today No chest pain No shortness of breath  REVIEW OF SYSTEMS:    ROS  CONSTITUTIONAL: No documented fever. No fatigue, weakness. No weight gain, no weight loss.  EYES: No blurry or double vision.  ENT: No tinnitus. No postnasal drip. No redness of the oropharynx.  RESPIRATORY: No cough, no wheeze, no hemoptysis. No dyspnea.  CARDIOVASCULAR: No chest pain. No orthopnea. No palpitations. No syncope.  GASTROINTESTINAL: No nausea, no vomiting or diarrhea. No abdominal pain. No melena or hematochezia.  GENITOURINARY: No dysuria or hematuria.  ENDOCRINE: No polyuria or nocturia. No heat or cold intolerance.  HEMATOLOGY: No anemia. No bruising. No bleeding.  INTEGUMENTARY: No rashes. No lesions.  MUSCULOSKELETAL: No arthritis. No swelling. No gout.  NEUROLOGIC: No numbness, tingling, or ataxia. No seizure-type activity.  PSYCHIATRIC: No anxiety. No insomnia. No ADD.   DRUG ALLERGIES:  No Known Allergies  VITALS:  Blood pressure 123/81, pulse 84, temperature 98.6 F (37 C), temperature source Oral, resp. rate 18, height 5\' 6"  (1.676 m), weight 64.4 kg, SpO2 100 %.  PHYSICAL EXAMINATION:   Physical Exam  GENERAL:  33 y.o.-year-old patient lying in the bed with no acute distress.  EYES: Pupils equal, round, reactive to light and accommodation. No scleral icterus. Extraocular muscles intact.  HEENT: Head atraumatic, normocephalic. Oropharynx and nasopharynx clear.  NECK:  Supple, no jugular venous distention. No thyroid enlargement, no tenderness.  LUNGS: Normal breath sounds bilaterally, no wheezing, rales, rhonchi. No use of accessory muscles of respiration.  CARDIOVASCULAR: S1, S2 normal. No murmurs, rubs, or gallops.  ABDOMEN: Soft,  nontender, nondistended. Bowel sounds present. No organomegaly or mass.  EXTREMITIES: No cyanosis, clubbing or edema b/l.    NEUROLOGIC: Cranial nerves II through XII are intact. No focal Motor or sensory deficits b/l.   PSYCHIATRIC: The patient is alert and oriented x 3.  SKIN: No obvious rash, lesion, or ulcer.   LABORATORY PANEL:   CBC Recent Labs  Lab 11/06/18 0111  WBC 8.2  HGB 14.1  HCT 41.6  PLT 222   ------------------------------------------------------------------------------------------------------------------ Chemistries  Recent Labs  Lab 11/06/18 0111  NA 140  K 3.9  CL 105  CO2 26  GLUCOSE 208*  BUN 18  CREATININE 1.25*  CALCIUM 8.5*   ------------------------------------------------------------------------------------------------------------------  Cardiac Enzymes Recent Labs  Lab 11/06/18 0111  TROPONINI 0.33*   ------------------------------------------------------------------------------------------------------------------  RADIOLOGY:  Dg Chest 2 View  Result Date: 11/05/2018 CLINICAL DATA:  Acute onset chest pain, diaphoresis, and weakness today. EXAM: CHEST - 2 VIEW COMPARISON:  05/01/2011 FINDINGS: The heart size and mediastinal contours are within normal limits. Both lungs are clear. The visualized skeletal structures are unremarkable. IMPRESSION: Negative.  No active cardiopulmonary disease. Electronically Signed   By: John  Stahl M.D.   On: 11/05/2018 11:30   Ct Angio Chest Aorta W And/or Wo Contrast  Result Date: 11/05/2018 CLINICAL DATA:  Chest pain starting this morning EXAM: CT ANGIOGRAPHY CHEST WITH CONTRAST TECHNIQUE: Multidetector CT imaging of the chest was performed using the standard protocol during bolus administration of intravenous contrast. Multiplanar CT image reconstructions and MIPs were obtained to evaluate the vascular anatomy. CONTRAST:  [id number] OMNIPAQUE IOHEXOL 350 MG/ML SOLN COMPARISON:  11/05/2018 radiographs FINDINGS:  Cardiovascular: Initial noncontrast images demonstrate no  evidence of intramural hematoma. No significant degree of atherosclerotic calcification in the chest. No thoracic aortic dissection or significant branch vessel abnormality identified. The pulmonary arterial contrast level is relatively dilute and not ideal for assessing the pulmonary arteries, but no large or central pulmonary embolus is identified. Heart size normal. Mediastinum/Nodes: Unremarkable Lungs/Pleura: Unremarkable Upper Abdomen: Unremarkable Musculoskeletal: Mild dextroconvex thoracic scoliosis. Review of the MIP images confirms the above findings. IMPRESSION: 1. A cause for the patient's chest pain is not identified. No dissection or acute findings noted. 2. Mild dextroconvex thoracic scoliosis. Electronically Signed   By: Van Clines M.D.   On: 11/05/2018 12:40     ASSESSMENT AND PLAN:   33 year old male patient with a known history of type 1 diabetes mellitus, hypertension, hypothyroidism, mild mental retardation, retinitis pigmentosa under hospitalist services  -Acute coronary syndrome Cardiac stress test today continue aspirin Cycle troponin check echocardiogram Cardiology consult appreciated Heparin drip for anticoagulation  -Diabetes mellitus type 1 Diabetic diet with sliding scale coverage with insulin  -Hypertension Continue ACE inhibitor  -Hyperlipidemia Continue Lipitor  -DVT prophylaxis On heparin drip for anticoagulation   All the records are reviewed and case discussed with Care Management/Social Worker. Management plans discussed with the patient, family and they are in agreement.  CODE STATUS: Full code  DVT Prophylaxis: SCDs  TOTAL TIME TAKING CARE OF THIS PATIENT: 35 minutes.   POSSIBLE D/C IN 1 DAYS, DEPENDING ON CLINICAL CONDITION.  Saundra Shelling M.D on 11/06/2018 at 2:18 PM  Between 7am to 6pm - Pager - [phone]  After 6pm go to [email or web address] - password [email or web address]  [facility]  Office  [phone]  CC: Primary care physician; Patient, No Pcp Per  Note: This dictation was prepared with Dragon dictation along with smaller phrase technology. Any transcriptional errors that result from this process are unintentional.

## 2018-11-06 NOTE — Discharge Instructions (Signed)
Angina ° °Angina is extreme discomfort in the chest, neck, arm, jaw or back. The discomfort is caused by a lack of blood in the middle layer of the heart wall (myocardium). °There are four types of angina: °· Stable angina. This is triggered by vigorous activity or exercise. It goes away when you rest or take angina medicine. °· Unstable angina. This is a warning sign and can lead to a heart attack (acute coronary syndrome). This is a medical emergency. Symptoms come at rest and last a long time. °· Microvascular angina. This affects the small coronary arteries. Symptoms include feeling tired and being short of breath. °· Prinzmetal or variant angina. This is caused by a tightening (spasm) of the arteries that go to your heart. °What are the causes? °This condition is caused by atherosclerosis. This is the buildup of fat and cholesterol (plaque) in your arteries. The plaque may narrow or block the artery. °Other causes include: °· Sudden tightening of the muscles of the arteries in the heart (coronary spasm). °· Small artery disease (microvascular dysfunction). °· Problems with any of your heart valves (heart valve disease). °· A tear in an artery in your heart (coronary artery dissection). °· Cardiomyopathy, or other heart disease. °What increases the risk? °You are more likely to develop this condition if you have: °· High cholesterol. °· High blood pressure. °· Diabetes. °· Family history of heart disease. °· Inactive (sedentary) lifestyle, or you do not exercise enough. °· Depression. °· Had radiation to the left side of your chest. °Other risk factors include: °· Using tobacco. °· Being obese. °· Eating a diet high in saturated fats. °· Being exposed to high stress or triggers of stress. °· Using drugs, such as cocaine. °Women have a greater risk for angina if: °· They are older than 55. °· They have gone through menopause (postmenopausal). °What are the signs or symptoms? °Common symptoms in both men and women  may include: °· Chest pain, which may: °? Feel like a crushing or squeezing in the chest, or a tightness, pressure, fullness, or heaviness in the chest. °? Last for more than a few minutes at a time, or it may stop and come back (recur) over the course of a few minutes. °· Pain in the neck, arm, jaw, or back. °· Unexplained heartburn or indigestion. °· Shortness of breath. °· Nausea. °· Sudden cold sweats. °Women and people with diabetes may have unusual (atypical) symptoms, such as: °· Fatigue. °· Unexplained feelings of nervousness or anxiety. °· Unexplained weakness. °· Dizziness or fainting. °How is this diagnosed? °This condition may be diagnosed based on: °· Your symptoms and medical history. °· Electrocardiogram (ECG) to measure the electrical activity in your heart. °· Blood tests. °· Stress test to look for signs of blockage when your heart is stressed. °· CT angiogram to examine your heart and the blood flow to it. °· Coronary angiogram to check your coronary arteries for blockage. °How is this treated? °Angina may be treated with: °· Medicines to: °? Prevent blood clots and heart attack. °? Relax blood vessels and improve blood flow to the heart (nitrates). °? Reduce blood pressure, improve the pumping action of the heart, and relax blood vessels that are spasming. °? Reduce cholesterol and help treat atherosclerosis. °· A procedure to widen a narrowed or blocked coronary artery (angioplasty). A mesh tube may be placed in a coronary artery to keep it open (coronary stenting). °· Surgery to allow blood to go around a blocked artery (  coronary artery bypass surgery). °Follow these instructions at home: °Medicines °· Take over-the-counter and prescription medicines only as told by your health care provider. °· Do not take the following medicines unless your health care provider approves: °? NSAIDs, such as ibuprofen, naproxen, or celecoxib. °? Vitamin supplements that contain vitamin A, vitamin E, or  both. °? Hormone replacement therapy that contains estrogen with or without progestin. °Eating and drinking ° °· Eat a heart-healthy diet. This includes plenty of fresh fruits and vegetables, whole grains, low-fat (lean) protein, and low-fat dairy products. °· Follow instructions from your health care provider about eating or drinking restrictions. °Activity °· Follow an exercise program approved by your health care provider. Join a cardiac rehabilitation program. °· Take a break when you feel fatigued. Plan rest periods in your daily activities. °Lifestyle ° °· Do not use any products that contain nicotine or tobacco, such as cigarettes and e-cigarettes. If you need help quitting, ask your health care provider. °· If your health care provider approves, limit alcohol intake to no more than 1 drink a day for women and 2 drinks a day for men. One drink equals 12 oz of beer, 5 oz of wine, or 1½ oz of hard liquor. °General instructions °· Maintain a healthy weight. °· Learn to manage stress. °· Keep your vaccinations up to date. Get the flu (influenza) vaccine every year. °· Talk to your health care provider if you feel depressed. Take a depression screening test to see if you are at risk for depression. °· Work with your health care provider to manage other health conditions, such as hypertension or diabetes. °· Keep all follow-up visits as told by your health care provider. This is important. °Get help right away if: °· You have pain in your chest, neck, arm, jaw, or back, and the pain: °? Lasts more than a few minutes. °? Is recurring. °? Is not relieved by taking medicines under the tongue (sublingual nitroglycerin). °? Increases in intensity or frequency. °· You have a lot of sweating without cause. °· You have unexplained: °? Heartburn or indigestion. °? Shortness of breath or difficulty breathing. °? Nausea or vomiting. °? Fatigue. °? Feelings of nervousness or anxiety. °? Weakness. °· You have sudden  light-headedness or dizziness. °· You faint. °These symptoms may represent a serious problem that is an emergency. Do not wait to see if the symptoms will go away. Get medical help right away. Call your local emergency services (911 in the [place] Do not drive yourself to the hospital. °Summary °· Angina is extreme discomfort in the chest, neck, or arm that is caused by a lack of blood in the heart wall. °· There are many symptoms of angina. They include chest pain or pain in the arms, neck, jaw, or back. °· Angina may be treated with behavioral changes, medicine, or surgery. °· Symptoms of angina may represent an emergency. Get medical help right away. Call your local emergency services (911 in the [place] Do not drive yourself to the hospital. °This information is not intended to replace advice given to you by your health care provider. Make sure you discuss any questions you have with your health care provider. °Document Released: 05/10/2005 Document Revised: 06/24/2017 Document Reviewed: 06/24/2017 °Elsevier Interactive Patient Education © 2019 [employer] ° °

## 2018-11-06 NOTE — Progress Notes (Signed)
*  PRELIMINARY RESULTS* Echocardiogram 2D Echocardiogram has been performed.  Sherrie Sport 11/06/2018, 8:04 AM

## 2018-11-06 NOTE — Progress Notes (Signed)
ANTICOAGULATION CONSULT NOTE - Initial Consult  Pharmacy Consult for Heparin  Indication: chest pain/ACS  No Known Allergies  Patient Measurements: Height: 5\' 6"  (167.6 cm) Weight: 141 lb 14.4 oz (64.4 kg) IBW/kg (Calculated) : 63.8 Heparin Dosing Weight:   61.2 kg   Vital Signs: Temp: 98.6 F (37 C) (06/15 0806) Temp Source: Oral (06/15 0806) BP: 123/81 (06/15 0806) Pulse Rate: 84 (06/15 0806)  Labs: Recent Labs    11/05/18 1048 11/05/18 1324 11/05/18 1757 11/06/18 0111 11/06/18 0723  HGB 15.7  --   --  14.1  --   HCT 45.5  --   --  41.6  --   PLT 221  --   --  222  --   APTT 28  --   --   --   --   LABPROT 12.6  --   --   --   --   INR 1.0  --   --   --   --   HEPARINUNFRC  --   --  <0.10* 0.10* 0.65  CREATININE 0.97  --   --  1.25*  --   TROPONINI 0.35* 0.33* 0.35* 0.33*  --     Estimated Creatinine Clearance: 78 mL/min (A) (by C-G formula based on SCr of 1.25 mg/dL (H)).   Medical History: Past Medical History:  Diagnosis Date  . Angiopathy, diabetic ([place])   . Diabetes mellitus    Type 1, diagnosed age 46, with frequent DKA admissions, difficult to control due to MR  . Diabetic nephropathy ([place])    Started on Lisinopril 5 mg  . Diabetic peripheral neuropathy ([place])   . Dysmorphic features   . Goiter   . Hypertension   . Hypoglycemia associated with diabetes ([place])   . Hypothyroidism   . Impaired cognition    mention of mental retardation  . Mental retardation   . Moderate or severe vision impairment, both eyes, impairment level not further specified   . 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 Thyroiditis, autoimmune     Medications:  Medications Prior to Admission  Medication Sig Dispense Refill Last Dose  . atorvastatin (LIPITOR) 10 MG tablet TAKE 1 TABLET (10 MG TOTAL) BY MOUTH DAILY. 90 tablet 0 11/05/2018 at Unknown time  . glucose blood (ONETOUCH VERIO) test strip USE TO CHECK BLOOD SUGAR 6 TIMES DAILY AS DIRECTED (Patient  taking differently: 3 (three) times daily. ) 200 each 5 unknown at unknown  . Insulin Lispro Prot & Lispro (HUMALOG MIX 75/25 KWIKPEN) (75-25) 100 UNIT/ML Kwikpen INJECT 34 UNITS OF INSULIN IN THE MORNING AND 20 UNITS IN THE EVENING (Patient taking differently: INJECT 28 UNITS OF INSULIN IN THE MORNING AND 24 UNITS IN THE EVENING) 15 mL 5 11/04/2018 at Unknown time  . lisinopril (ZESTRIL) 10 MG tablet TAKE 1 TABLET BY MOUTH EVERY DAY 90 tablet 1 11/05/2018 at Unknown time  . SYNTHROID 25 MCG tablet TAKE 1 TABLET (25 MCG TOTAL) BY MOUTH DAILY BEFORE BREAKFAST. 90 tablet 1 11/04/2018 at Unknown time    Assessment: 6/14:  HL @ 4854 = < 0.1 6/14:  HL @ 0111 = 0.1 Heparin 1800 units IV X 1 bolus and increase drip rate to 1150 units/hr 6/15:  HL @ 0723 = 0.65  Goal of Therapy:  Heparin level 0.3-0.7 units/ml Monitor platelets by anticoagulation protocol: Yes   Plan:  Heparin level therapeutic. Will continue current rate. Will recheck HL in 6 hours. Daily CBC while  on heparin.   Paschal DoppKishan Martino Tompson, PharmD, BCPS 11/06/2018 10:04 AM

## 2018-11-06 NOTE — Discharge Summary (Signed)
[facility] NAME: [name]    MR#:  [id number]  DATE OF BIRTH:  [birth year]  DATE OF ADMISSION:  11/05/2018 ADMITTING PHYSICIAN: Saundra Shelling, MD  DATE OF DISCHARGE: 11/06/2018  PRIMARY CARE PHYSICIAN: Patient, No Pcp Per   ADMISSION DIAGNOSIS:  NSTEMI (non-ST elevated myocardial infarction) ([place]) [I21.4]  DISCHARGE DIAGNOSIS:  Acute Coronary Syndrome Hypertension Hyperlipidemia Type I Diabetes mellitus  SECONDARY DIAGNOSIS:   Past Medical History:  Diagnosis Date  . Angiopathy, diabetic ([place])   . Diabetes mellitus    Type 1, diagnosed age 31, with frequent DKA admissions, difficult to control due to MR  . Diabetic nephropathy ([place])    Started on Lisinopril 5 mg  . Diabetic peripheral neuropathy ([place])   . Dysmorphic features   . Goiter   . Hypertension   . Hypoglycemia associated with diabetes ([place])   . Hypothyroidism   . Impaired cognition    mention of mental retardation  . Mental retardation   . Moderate or severe vision impairment, both eyes, impairment level not further specified   . 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 Thyroiditis, autoimmune      ADMITTING HISTORY [name]  is a 33 y.o. male with a known history of type 1 diabetes mellitus, hypertension, hypothyroidism, mild mental retardation, retinitis pigmentosa presented to the emergency room for chest pain.  The chest pain started at 8 AM this morning located in the left-sided chest sharp in nature 5 out of 10 on a scale of 1-10.  No complaints of any shortness of breath.  No nausea or vomiting.  Patient was evaluated in the emergency room troponin was elevated to 0.35.  CTA chest was ordered pending test results.   HOSPITAL COURSE:  Patient admitted to telemetry. Serial troponins were elevated. Patient anticoagulated with heparin drip.Cardiology consult done. Patient was worked up with stress test. Stress test  Report :  Blood pressure demonstrated a normal response to exercise.  There was no ST segment deviation noted during stress.  The study is normal.  This is a low risk study.  The left ventricular ejection fraction is normal (55-65%).  CONSULTS OBTAINED:  Cardiology  DRUG ALLERGIES:  No Known Allergies  DISCHARGE MEDICATIONS:   Allergies as of 11/06/2018   No Known Allergies     Medication List    TAKE these medications   atorvastatin 10 MG tablet Commonly known as: LIPITOR TAKE 1 TABLET (10 MG TOTAL) BY MOUTH DAILY.   glucose blood test strip Commonly known as: OneTouch Verio USE TO CHECK BLOOD SUGAR 6 TIMES DAILY AS DIRECTED What changed:   when to take this  additional instructions   Insulin Lispro Prot & Lispro (75-25) 100 UNIT/ML Kwikpen Commonly known as: HumaLOG Mix 75/25 KwikPen INJECT 34 UNITS OF INSULIN IN THE MORNING AND 20 UNITS IN THE EVENING What changed: additional instructions   lisinopril 10 MG tablet Commonly known as: ZESTRIL TAKE 1 TABLET BY MOUTH EVERY DAY   Synthroid 25 MCG tablet Generic drug: levothyroxine TAKE 1 TABLET (25 MCG TOTAL) BY MOUTH DAILY BEFORE BREAKFAST.       Today  Patient seen today No chest pain Tolerating diet ok Hemodynamically stable Will be discharged home. VITAL SIGNS:  Blood pressure 120/78, pulse 99, temperature 98.3 F (36.8 C), temperature source Oral, resp. rate 18, height 5\' 6"  (1.676 m), weight 64.4 kg, SpO2 100 %.  I/O:    Intake/Output  Summary (Last 24 hours) at 11/06/2018 1712 Last data filed at 11/06/2018 1623 Gross per 24 hour  Intake 139.94 ml  Output 575 ml  Net -435.06 ml    PHYSICAL EXAMINATION:  Physical Exam  GENERAL:  33 y.o.-year-old patient lying in the bed with no acute distress.  LUNGS: Normal breath sounds bilaterally, no wheezing, rales,rhonchi or crepitation. No use of accessory muscles of respiration.  CARDIOVASCULAR: S1, S2 normal. No murmurs, rubs, or gallops.   ABDOMEN: Soft, non-tender, non-distended. Bowel sounds present. No organomegaly or mass.  NEUROLOGIC: Moves all 4 extremities. PSYCHIATRIC: The patient is alert and oriented x 3.  SKIN: No obvious rash, lesion, or ulcer.   DATA REVIEW:   CBC Recent Labs  Lab 11/06/18 0111  WBC 8.2  HGB 14.1  HCT 41.6  PLT 222    Chemistries  Recent Labs  Lab 11/06/18 0111  NA 140  K 3.9  CL 105  CO2 26  GLUCOSE 208*  BUN 18  CREATININE 1.25*  CALCIUM 8.5*    Cardiac Enzymes Recent Labs  Lab 11/06/18 0111  TROPONINI 0.33*    Microbiology Results  Results for orders placed or performed during the hospital encounter of 11/05/18  Novel Coronavirus,NAA,(SEND-OUT TO REF LAB - TAT 24-48 hrs); Hosp Order     Status: None   Collection Time: 11/05/18 12:33 PM   Specimen: Nasopharyngeal Swab; Respiratory  Result Value Ref Range Status   SARS-CoV-2, NAA NOT DETECTED NOT DETECTED Final    Comment: (NOTE) Testing was performed using the cobas(R) SARS-CoV-2 test. This test was developed and its performance characteristics determined by [facility]. This test has not been FDA cleared or approved. This test has been authorized by FDA under an Emergency Use Authorization (EUA). This test is only authorized for the duration of time the declaration that circumstances exist justifying the authorization of the emergency use of in vitro diagnostic tests for detection of SARS-CoV-2 virus and/or diagnosis of COVID-19 infection under section 564(b)(1) of the Act, 21 U.S.C. 161WRU-0(A)(5360bbb-3(b)(1), unless the authorization is terminated or revoked sooner. When diagnostic testing is negative, the possibility of a false negative result should be considered in the context of a patient's recent exposures and the presence of clinical signs and symptoms consistent with COVID-19. An individual without symptoms of COVID-19 and who is not shedding SARS-CoV-2 virus would expect to have  a negative (not  detected) result in this assay. Performed At: [facility] [address] [place], [place] [id number] Jolene SchimkeNagendra Sanjai MD NW:[phone]    Coronavirus Source NASOPHARYNGEAL  Final    Comment: Performed at [facility], [address] [place], [place] [address]    RADIOLOGY:  Dg Chest 2 View  Result Date: 11/05/2018 CLINICAL DATA:  Acute onset chest pain, diaphoresis, and weakness today. EXAM: CHEST - 2 VIEW COMPARISON:  05/01/2011 FINDINGS: The heart size and mediastinal contours are within normal limits. Both lungs are clear. The visualized skeletal structures are unremarkable. IMPRESSION: Negative.  No active cardiopulmonary disease. Electronically Signed   By: Myles RosenthalJohn  Stahl M.D.   On: 11/05/2018 11:30   Nm Myocar Multi W/spect W/wall Motion / Ef  Result Date: 11/06/2018  Blood pressure demonstrated a normal response to exercise.  There was no ST segment deviation noted during stress.  The study is normal.  This is a low risk study.  The left ventricular ejection fraction is normal (55-65%).    Ct Angio Chest Aorta W And/or Wo Contrast  Result Date: 11/05/2018 CLINICAL DATA:  Chest pain starting this morning EXAM: CT ANGIOGRAPHY CHEST WITH CONTRAST TECHNIQUE: Multidetector CT imaging of the chest was performed using the standard protocol during bolus administration of intravenous contrast. Multiplanar CT image reconstructions and MIPs were obtained to evaluate the vascular anatomy. CONTRAST:  75mL OMNIPAQUE IOHEXOL 350 MG/ML SOLN COMPARISON:  11/05/2018 radiographs FINDINGS: Cardiovascular: Initial noncontrast images demonstrate no evidence of intramural hematoma. No significant degree of atherosclerotic calcification in the chest. No thoracic aortic dissection or significant branch vessel abnormality identified. The pulmonary arterial contrast level is relatively dilute and not ideal for assessing the pulmonary arteries, but no large or central pulmonary embolus is  identified. Heart size normal. Mediastinum/Nodes: Unremarkable Lungs/Pleura: Unremarkable Upper Abdomen: Unremarkable Musculoskeletal: Mild dextroconvex thoracic scoliosis. Review of the MIP images confirms the above findings. IMPRESSION: 1. A cause for the patient's chest pain is not identified. No dissection or acute findings noted. 2. Mild dextroconvex thoracic scoliosis. Electronically Signed   By: Gaylyn RongWalter  Liebkemann M.D.   On: 11/05/2018 12:40    Follow up with PCP in 1 week.  Management plans discussed with the patient, family and they are in agreement.  CODE STATUS: Full code    Code Status Orders  (From admission, onward)         Start     Ordered   11/05/18 1303  Full code  Continuous     11/05/18 1302        Code Status History    Date Active Date Inactive Code Status Order ID Comments User Context   05/10/2012 1651 05/12/2012 1827 Full Code [id number]  Madalyn RobHicks, Tatjana Michelle, RN ED   04/30/2011 1337 05/02/2011 1322 Full Code [id number]  Prince Solianarbone, Estrellita Lagunay, RN Inpatient   Advance Care Planning Activity      TOTAL TIME TAKING CARE OF THIS PATIENT ON DAY OF DISCHARGE: more than 35 minutes.   Ihor AustinPavan Pyreddy M.D on 11/06/2018 at 5:12 PM  Between 7am to 6pm - Pager - [phone]  After 6pm go to [email or web address] - password [email or web address]  [facility]  Office  [phone]  CC: Primary care physician; Patient, No Pcp Per  Note: This dictation was prepared with Dragon dictation along with smaller phrase technology. Any transcriptional errors that result from this process are unintentional.

## 2018-11-07 LAB — HIV ANTIBODY (ROUTINE TESTING W REFLEX): HIV Screen 4th Generation wRfx: NONREACTIVE

## 2018-11-16 ENCOUNTER — Ambulatory Visit (INDEPENDENT_AMBULATORY_CARE_PROVIDER_SITE_OTHER): Payer: Medicare Other | Admitting: "Endocrinology

## 2018-12-13 ENCOUNTER — Telehealth (INDEPENDENT_AMBULATORY_CARE_PROVIDER_SITE_OTHER): Payer: Self-pay | Admitting: "Endocrinology

## 2018-12-13 ENCOUNTER — Other Ambulatory Visit (INDEPENDENT_AMBULATORY_CARE_PROVIDER_SITE_OTHER): Payer: Self-pay | Admitting: *Deleted

## 2018-12-13 DIAGNOSIS — IMO0001 Reserved for inherently not codable concepts without codable children: Secondary | ICD-10-CM

## 2018-12-13 MED ORDER — ONETOUCH VERIO VI STRP: ORAL_STRIP | 5 refills | Status: DC

## 2018-12-13 NOTE — Telephone Encounter (Signed)
°  Who's calling (name and relationship to patient) : Cecille Rubin (mom)  Best contact number: [phone]  Provider they see: Tobe Sos  Reason for call: Mom called need a new Rx sent for patient for 3x a day so insurance can cover.  It will not cover 4 x a day.  She stated patient is almost out of test strips     PRESCRIPTION REFILL ONLY  Name of prescription: test strips   Ph[facility]acy: [facility] [place]

## 2018-12-13 NOTE — Telephone Encounter (Signed)
LVM, Advised script sent, also reminded them of appt tomorrow.

## 2018-12-14 ENCOUNTER — Encounter (INDEPENDENT_AMBULATORY_CARE_PROVIDER_SITE_OTHER): Payer: Self-pay | Admitting: "Endocrinology

## 2018-12-14 ENCOUNTER — Other Ambulatory Visit: Payer: Self-pay

## 2018-12-14 ENCOUNTER — Ambulatory Visit (INDEPENDENT_AMBULATORY_CARE_PROVIDER_SITE_OTHER): Payer: Medicare Other | Admitting: "Endocrinology

## 2018-12-14 VITALS — BP 130/78 | HR 84 | Ht 64.5 in | Wt 139.6 lb

## 2018-12-14 DIAGNOSIS — K141 Geographic tongue: Secondary | ICD-10-CM | POA: Diagnosis not present

## 2018-12-14 DIAGNOSIS — E11649 Type 2 diabetes mellitus with hypoglycemia without coma: Secondary | ICD-10-CM

## 2018-12-14 DIAGNOSIS — I10 Essential (primary) hypertension: Secondary | ICD-10-CM | POA: Diagnosis not present

## 2018-12-14 DIAGNOSIS — IMO0001 Reserved for inherently not codable concepts without codable children: Secondary | ICD-10-CM

## 2018-12-14 DIAGNOSIS — E1043 Type 1 diabetes mellitus with diabetic autonomic (poly)neuropathy: Secondary | ICD-10-CM | POA: Diagnosis not present

## 2018-12-14 DIAGNOSIS — E049 Nontoxic goiter, unspecified: Secondary | ICD-10-CM | POA: Diagnosis not present

## 2018-12-14 DIAGNOSIS — I249 Acute ischemic heart disease, unspecified: Secondary | ICD-10-CM

## 2018-12-14 DIAGNOSIS — R000 Tachycardia, unspecified: Secondary | ICD-10-CM

## 2018-12-14 DIAGNOSIS — E1042 Type 1 diabetes mellitus with diabetic polyneuropathy: Secondary | ICD-10-CM | POA: Diagnosis not present

## 2018-12-14 DIAGNOSIS — E1065 Type 1 diabetes mellitus with hyperglycemia: Secondary | ICD-10-CM | POA: Diagnosis not present

## 2018-12-14 DIAGNOSIS — E063 Autoimmune thyroiditis: Secondary | ICD-10-CM

## 2018-12-14 LAB — POCT GLYCOSYLATED HEMOGLOBIN (HGB A1C): Hemoglobin A1C: 9.7 % — AB (ref 4.0–5.6)

## 2018-12-14 LAB — POCT GLUCOSE (DEVICE FOR HOME USE): Glucose Fasting, POC: 304 mg/dL — AB (ref 70–99)

## 2018-12-14 NOTE — Progress Notes (Signed)
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  HISTORY OF PRESENT ILLNESS:   [name] is a 33 y.o. Caucasian young man.  [name] was accompanied by his friend, [name].       1. I first consulted on [name] when he was admitted to the Pediatric Ward at the [facility] in May 2007.  A. The patient was diagnosed with T1DM at age 21 in about 2001 or 2002. We have few details about his medical care until 2007. We know that in about 2006 he saw a pediatric endocrinologist at [facility], [facility], [facility]. Unfortunately, the family chose not to return to see the endocrinologist. In about April of 2007, his primary care provider, Dr. Reed Breech of [facility], started the patient on an insulin pump. Unfortunately, neither the patient nor the family were capable of utilizing the pump's technology effectively.   B. On 10/02/2005 the patient was admitted to the [facility] for poorly controlled type 1 diabetes. I consulted on the patient at that time. 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 He did not have the insight or intelligence to perform the daily tasks of diabetes self-care independently or to utilize the insulin pump. It also appeared that there was not a great deal of adult supervision and support within his family. We tried to teach him and his family how to follow a multiple daily injection of insulin plan with Lantus and Novolog insulin, but that attempt failed rapidly. I changed his  regimen to Novolog Mix 70/30 insulin taken twice daily. He was to take 26 units each morning at breakfast and 14 units each evening at supper. I also gave him a sliding scale for Humalog lispro with different doses of insulin to be used at breakfast, lunch, supper, and bedtime in addition to his 70/30 insulin when his family members were available to check the blood sugars and give the additional insulin. The patient's family members agreed to supervise his care.  2. Unfortunately, [name] has frequently not had the adult supervision and support that he needed. In the last 12 years, the patient has been scheduled to return to our clinic for follow up visits approximately every 3 months, but has actually only returned to our clinic for follow up visits 1-4 times per year, although the family has done much better in the past three years. In 2012 he had 3 visits and was admitted once. In 2013 he had one admission in December, followed by a clinic visit later that month. In 2014 he had 4 clinic visits. In 2015 he had two clinic visits. In fairness, however, I should note that I was unavailable to see him for two months in 2015 due to my severe leg injury and post-operative recuperation. He had 4 visits in 2016 and 4 visits in 2017, but in 2018 he only had 3 visits. Today is his fourth visit in 2019. Since moving in with his sister and her family in January 2019, the adult supervision and support in Hoskins' life have substantially improved.  3. During the past 13 years [name] has had multiple medical problems:  A. T1DM: His hemoglobin A1c  values have been mostly in the 9.0-14.0% range. He has the following complications of diabetes: diabetic angiopathy, peripheral neuropathy, autonomic neuropathy, inappropriate sinus tachycardia, gastroparesis and postprandial abdominal bloating, chronic constipation, urinary microalbuminuria, and occasional but significant hypoglycemia. Due to health insurance restrictions he has taken  Novolog 70/30 Mix insulin at some times and Humalog 75/25 Mix insulin at other times.   B. Autoimmune hypothyroidism: In November 2007, the patient was diagnosed with hypothyroidism secondary to Hashimoto's disease and was started on Synthroid at a dose of 25 mcg per day.   C. Hypertension: In November 2008, he was diagnosed with hypertension and started on lisinopril, 5 mg per day.   D. Hypercholesterolemia: In July 2014 he was diagnosed with hypercholesterolemia and started on atorvastatin, 10 mg/day.  E. Noncompliance/medical neglect/inadequate medical supervision: Unfortunately, [name] has frequently missed BG checks, doses of insulin, and doses of his oral medications. Parental/family supervision and support have varied in consistency and in quality throughout these years. However, since [name] has been living with [name] SetaHeather and her family, the supervision and support have been much better.  4. [name]' last [facility] clinic visit was on 08/01/18.  At that visit we reduced his Humalog Mix 75/25 insulin doses to 28 units in the mornings and 24 units in the evenings.    A. In the interim he has been healthy, except for an episode of chest pain and pallor on 11/05/18. He was admitted to [facility] where he was diagnosed with acute coronary syndrome.   B. Since his last visit he has not had many low BGs. He has been trying to eat better, but has not been exercising much. He continues on his morning insulin dose of 28 units and his evening dose of 24 units. His dentures are working well.  He admits that he still sometimes forgets to take his insulin.   C. BG control has still been quite variable. He admits that he has been checking some BGs before meals, but others post-prandially. He can't remember which is which.  [name]. [name]' sister, [name] SetaHeather, now supervises his care in her home, but probably less so in the past several months. .   E. He now has both Medicaid and Medicare coverage, however, [name] Medicaid will no longer pay for  medications. He now receives his insulins and DM supplies through [facility].    F.  He is not doing much itching and scratching.   G. He is supposed to be taking 25 mcg of Synthroid daily, 10 mg of lisinopril daily, and 10 mg of atorvastatin daily. He thinks that he does not miss too many doses.   5. Pertinent Review of Systems:  Constitutional: The patient feels "good overall". The admission last month scared him, so he has been trying to take his medications more consistently. He says that he is doing better with his irritability, depression, and willingness to cooperate with his DM care plan, but some days go better than others.   However, when he becomes hypoglycemic or extremely hyperglycemic, he can still be very irritable and sometimes even combative.  Eyes: Vision is about the same. He is legally blind. He had his annual eye exam in October or November 2019. He was told that his diabetes is not interfering with his eyes.  Neck: The patient has no complaints of anterior neck swelling, soreness, tenderness, pressure, discomfort, or difficulty swallowing.  Heart: The patient has no other complaints of palpitations, irregular heat beats, chest pain, or chest pressure. Gastrointestinal: He is  no longer constipated and does not have bloating after meals. The patient has no other complaints of excessive hunger, acid reflux, stomach aches or pains, diarrhea, or constipation. Legs: Muscle mass and strength seem normal. There are no complaints of numbness, tingling, burning, or pain. No edema is noted. Feet: [name] has not had any recent tingling in his feet. He says he no longer picks at his toe nails. There are no recent complaints of numbness, tingling, burning, or pain. No edema is noted. GU: He rarely has nocturia. Hypoglycemia: He has not had many low BGs in the past month.    .    6. BG meter review: We have no current data. His old BG meter can't be downloaded. We gave him a new BG  meter.   PAST MEDICAL, FAMILY, AND SOCIAL HISTORY:  Past Medical History:  Diagnosis Date  . Angiopathy, diabetic (HCC)   . Diabetes mellitus    Type 1, diagnosed age 33, with frequent DKA admissions, difficult to control due to MR  . Diabetic nephropathy (HCC)    Started on Lisinopril 5 mg  . Diabetic peripheral neuropathy (HCC)   . Dysmorphic features   . Goiter   . Hypertension   . Hypoglycemia associated with diabetes (HCC)   . Hypothyroidism   . Impaired cognition    mention of mental retardation  . Mental retardation   . Moderate or severe vision impairment, both eyes, impairment level not further specified   . 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 Thyroiditis, autoimmune     Family History  Problem Relation Age of Onset  . Vision loss Father   . Retinitis pigmentosa Mother   . Diabetes Maternal Grandmother        AODM  . Diabetes Paternal Grandmother        AODM  . Diabetes Cousin        Second cousin has juvenile-onset DM.     Current Outpatient Medications:  .  atorvastatin (LIPITOR) 10 MG tablet, TAKE 1 TABLET (10 MG TOTAL) BY MOUTH DAILY., Disp: 90 tablet, Rfl: 0 .  glucose blood (ONETOUCH VERIO) test strip, USE TO CHECK BLOOD SUGAR 3 TIMES DAILY AS DIRECTED, Disp: 100 each, Rfl: 5 .  Insulin Lispro Prot & Lispro (HUMALOG MIX 75/25 KWIKPEN) (75-25) 100 UNIT/ML Kwikpen, INJECT 34 UNITS OF INSULIN IN THE MORNING AND 20 UNITS IN THE EVENING (Patient taking differently: INJECT 28 UNITS OF INSULIN IN THE MORNING AND 24 UNITS IN THE EVENING), Disp: 15 mL, Rfl: 5 .  lisinopril (ZESTRIL) 10 MG tablet, TAKE 1 TABLET BY MOUTH EVERY DAY, Disp: 90 tablet, Rfl: 1 .  SYNTHROID 25 MCG tablet, TAKE 1 TABLET (25 MCG TOTAL) BY MOUTH DAILY BEFORE BREAKFAST., Disp: 90 tablet, Rfl: 1  Allergies as of 12/14/2018  . (No Known Allergies)    1. Work and Family: [name] remains unemployed. He is still living with his sister, [name], and her family. [name]  stays at home and so can look after [name] Ohmhris. [name]' friend, [name], also lives in the house. [name] Ohmhris and [name] help each other out and exercise together. Mom has gone back to work.  2. Activities: He has been walking and riding his bike less recently.  3. Smoking, alcohol, or drugs: No tobacco or alcohol or drugs.  4. Primary Care Provider: None at present  REVIEW OF SYSTEMS: There are no other significant problems involving Adell's other body systems.   Objective:  Vital Signs:  BP 130/78   Pulse 84   Ht 5' 4.5" (1.638 m)   Wt 139 lb 9.6 oz (63.3 kg)   BMI 23.59 kg/m    Ht Readings from Last 3 Encounters:  12/14/18 5' 4.5" (1.638 m)  11/05/18 5\' 6"  (1.676 m)  08/01/18 5' 4.57" (1.64 m)   Wt Readings from Last 3 Encounters:  12/14/18 139 lb 9.6 oz (63.3 kg)  11/06/18 141 lb 14.4 oz (64.4 kg)  08/01/18 143 lb 3.2 oz (65 kg)   Body surface area is 1.7 meters squared.  Facility age limit for growth percentiles is 20 years. Facility age limit for growth percentiles is 20 years.  PHYSICAL EXAM:  Constitutional: The patient appears healthy, but his gait is still somewhat wide-based and shuffling due to his visual handicap. His weight has decreased by 2 pounds. He was engaged and interactive today. He still does not really remember much about checking his BGs and taking his insulins. His thought processes are still very concrete. His insight remains poor. He really does not understand or comprehend much about diabetes, about why he needs to take care of himself, and about what will happen to him if he does not take care of himself.  Face: The face is somewhat dysmorphic. His face is very similar to mom's face and [name]'s face. Eyes: There is no obvious arcus or proptosis. His eyes are slightly dry.  Mouth: The oropharynx appears normal. His geographic tongue has resolved. He no longer has any maxillary teeth, but he is wearing his new dentures. His mouth is normally moist.    Neck: The neck appears to be visibly normal. No carotid bruits are noted. The thyroid gland has increased a bit in size to 21 grams today. The consistency of the thyroid gland is normal today. The thyroid gland is not tender to palpation. Lungs: The lungs are clear to auscultation. Air movement is good. Heart: Heart rate is rapid, but rhythm is regular. Heart sounds S1 and S2 are normal. I did not appreciate any pathologic cardiac murmurs. Abdomen: The abdomen is normal in size, but he has more adipose tissue today. Bowel sounds are normal. There is no obvious hepatomegaly, splenomegaly, or other mass effect.  Arms: Muscle size and bulk are normal for age. He has no new areas of excoriation on his arms. The older areas are healing or have healed.  Hands: There is no obvious tremor. Phalangeal and metacarpophalangeal joints are normal. Palmar muscles are normal. Palmar skin is normal. Palmar moisture is also normal. Legs: Muscles appear normal for age. No edema is present. Feet: Feet are normally formed. He stubbed his left great toe a month or so ago and had bleeding beneath the nail. Capillary refill is slightly delayed in both toes, but is symmetric. Dorsalis pedal pulses are faint 1+ bilaterally. PT pulses are 1+ on the right, but faint 1+ on the left.  Neurologic: Strength is normal for age in both the upper and lower extremities. Muscle tone is normal. Sensation to touch is normal in both the legs and feet.     LAB DATA:   Labs 12/14/18: HbA1c 9.7%, CBG 304  Labs 08/01/18: HbA1c 8.9%, CBG 89  Labs 02/23/18: HbA1c 8.8%, CBG 201; TSH 5.00, free T4 1.1, free T3 3.2;   Labs 11/23/17: CBG 338  Labs 09/26/17: HbA1c 10.9%, CBG 364, urine glucose 2000, urine ketones negative  Labs 06/27/17: HbA1c 9.0%, CBG 134; TSH 1.49, free T4 1.2, free T3 3.1; CMP normal, except for  glucose of 213; cholesterol 134, triglycerides 79, HDL 53, LDL 65; urinary microalbumin/creatinine ratio was 748   Labs 12/31/15:  HbA1c 11.7%, CBG 145  Labs 09/29/16: HbA1c 10.2%, CBG 70  Labs 06/01/16: HbA1c >14%. CBG 288; TSH 2.65, free T4 1.2, free T3 2.7; cholesterol 201, triglycerides 106, HDL 54, LDL 127; CMP normal except glucose 130 and albumin 3.5.   Labs 01/05/16: HbA1c 11.0%  Labs 11/04/15: HbA1c 10.5%  Labs 09/12/15: HbA1c 10.3%; CMP normal except for glucose 149 and sodium 133; cholesterol 190, triglycerides 90, HDL 55, LDL 117; urinary microalbumin/creatinine ratio 190; TSH 3.09, free T4 1.3, free T3 3.8  Labs 07/16/15: HbA1c 10.6%, Urine dipstick shows trace ketones  Labs 04/09/15: HbA1c 10.4%  Labs 09/09/14: HbA1c 11.9%; TSH 1.822, free T4 1.02, and free T3 2.6; CMP normal except for glucose of 273; cholesterol 174, triglycerides 181, HDL 46, LDL 92; urinary microalbumin/creatinine ratio 139.2  Labs 06/19/14: HbA1c 10.7%  Labs 05/12/14: CMP normal  Labs 02/19/14: HbA1c 10.2%  Labs 07/31/13: HbA1c 8.5%; CMP normal, except glucose 269; microalbumin/creatinine ratio 51.6; TSH 1.885, free T4 1.31, free T3 3.4   Labs 7/018/14: CMP normal, except glucose of 14; cholesterol 202, triglycerides 85, HDL 58, LDL 127; urinary microalbumin/creatinine ratio 51.9; TSH 2.993, free T4 1.32, free T3 2.8  05/10/12: Creatinine 0.62, TSH 1.025,            Assessment and Plan:   ASSESSMENT:  1-2. Diabetes mellitus/hypoglycemia:   A. The patient's HbA1c is higher today. Unfortunately, I do not have any BG data to help me understand why his HbA1c is higher and how to adjust his medications without causing severe hypoglycemia. I suspect that the fact that he has not been exercising as much is part of the problem. I also suspect that he still misses too many insulin doses.   B. With [name]'s supervision, [name] had been eating more healthy.   C.  At his prior visit I asked [name] to have [name] test his BGs, take his insulins, and take his oral meds directly in front of her. I also asked her to try to limit [name]'s late night  snacks.   Sherryl Manges needs continuing adult supervision.  3. Hypertension: Blood pressure is higher today. He needs to take hs medication consistently.   4. Peripheral neuropathy: This problem has improved and is not evident today, but will certainly recur if he does not get his BGs under better control.   5. Angiopathy: His angiopathy is still fairly severe, but is improving. If he exercises on a regular basis and controls his BGs, the blood flow in his legs and feet will likely improve further. 6. Goiter/thyroiditis: Thyroid gland has increased in size again. The process of waxing and waning of thyroid gland size is c/w evolving Hashimoto's thyroiditis.   7. Hypothyroid: He was mid-range euthyroid in April 2016, but borderline low in April 2017. His TSH in January 2018 was better, but still above the goal range of 1.0-2.0. His TSH in February 2019 was within the goal range. His TFTs in October 2019 were low, largely due to missing Synthroid doses. He needs to take his Synthroid daily. We need to repeat his TFTs today.  8. Autonomic neuropathy, tachycardia, bloating, and gastroparesis: His heart rate is normal and he is not having any GI symptoms.   9. Combined hyperlipidemia: His lipids in February 2019 were normal on his current dose of atorvastatin.  10. Weight loss, unintentional: This process had resolved with him taking  more insulin and more food, but can recur when he is not as compliant with his DM care plan.    11. Dehydration: He is not dehydrated today.  12. Medical neglect: [name]' sister and her husband had been really trying to supervise [name], although he often makes it difficult for them to do so. Overall [name]' DM care is much better with [name]'s supervision.  13. 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  14. Microalbuminuria/macroalbuminuria: His microalbumin/creatinine ratio in February 2019 was the highest that it has been in the past four nears. He needs better control of both  his BGs and his BPs. He still needs to take his lisinopril every day.   15. Acute coronary syndrome: This event scared [name]. Since [name] does not have a PCP, I will refer him to [facility] for further E&M.   PLAN:  1. Diagnostic: HbA1c and CBG today. Obtain annual surveillance labs even in a non-fasting state. Bring in BG meter in two weeks for download. Refer to cardiology.  2. Therapeutic:   A. Continue  the Humalog Mix 75/25 insulin dose in the mornings of 28 units. Continue  the dinner insulin dose to 24 units. However, if the morning BG is <100, reduce the morning insulin dose to 26 units. If the dinner BG is <100, reduce the dinner insulin dose to 23 units. If the BG at bedtime is <200, follow the Small column bedtime snack plan.    B. Take oral medications once a day, at whatever time the family can fit in the doses.   C. I asked that [name] directly supervise his BG checks, insulin dosing, and oral meds.  3. Patient education: We discussed all of the above. I asked [name] again to please cooperate with his family members. 4. Follow-up: 3 months. Ensure that [name] can attend.   Level of Service: This visit lasted in excess of 65 minutes. More than 50% of the visit was devoted to counseling.  Molli Knock, MD 12/14/2018 10:40 AM

## 2018-12-14 NOTE — Patient Instructions (Addendum)
Follow up visit in 2 months. Please bring in BG meter in 2 weeks for download. Please have [name] attend [name]' next clinic visit. [name] needs his own primary care provider. He also needs to see a cardiologist.

## 2018-12-21 ENCOUNTER — Other Ambulatory Visit (INDEPENDENT_AMBULATORY_CARE_PROVIDER_SITE_OTHER): Payer: Self-pay | Admitting: *Deleted

## 2018-12-21 DIAGNOSIS — R079 Chest pain, unspecified: Secondary | ICD-10-CM

## 2018-12-25 ENCOUNTER — Encounter: Payer: Self-pay | Admitting: Cardiovascular Disease

## 2018-12-25 ENCOUNTER — Ambulatory Visit (INDEPENDENT_AMBULATORY_CARE_PROVIDER_SITE_OTHER): Payer: Medicare Other | Admitting: Cardiovascular Disease

## 2018-12-25 ENCOUNTER — Other Ambulatory Visit: Payer: Self-pay

## 2018-12-25 VITALS — BP 128/82 | HR 63 | Temp 97.6°F | Ht 67.0 in | Wt 143.5 lb

## 2018-12-25 DIAGNOSIS — I249 Acute ischemic heart disease, unspecified: Secondary | ICD-10-CM

## 2018-12-25 DIAGNOSIS — R7989 Other specified abnormal findings of blood chemistry: Secondary | ICD-10-CM | POA: Diagnosis not present

## 2018-12-25 DIAGNOSIS — R778 Other specified abnormalities of plasma proteins: Secondary | ICD-10-CM

## 2018-12-25 DIAGNOSIS — I10 Essential (primary) hypertension: Secondary | ICD-10-CM

## 2018-12-25 MED ORDER — ASPIRIN EC 81 MG PO TBEC: 81.0000 mg | DELAYED_RELEASE_TABLET | Freq: Every day | ORAL | 3 refills | Status: DC

## 2018-12-25 NOTE — Progress Notes (Signed)
Cardiology Office Note   Date:  12/25/2018   ID:  [name], DOB 09/26/1985, MRN [id number]  PCP:  Patient, No Pcp Per  Cardiologist:   Lorine BearsMuhammad , MD   Chief Complaint  Patient presents with  . other    [name] June [facility]. Meds reviewed by the pt. verbally. "doing well."       History of Present Illness: [name] is a 33 y.o. male who was referred by Dr. Fransico MichaelBrennan for evaluation of chest pain.  He has known history of type 1 diabetes, hypertension, hypothyroidism, mild mental retardation and retinitis pigmentosa.  [facility] with left sided chest pain.  He was found to have mildly elevated troponin at 0.35 but remained flat.  He underwent CTA of the chest which showed no evidence of pulmonary embolism or aortic dissection.  Lexiscan Myoview showed no evidence of ischemia with normal ejection fraction.  Echocardiogram also showed normal ejection fraction, normal RV function and no significant valvular abnormalities.  He reports no recurrent chest pain since then and no shortness of breath.  He has no exertional symptoms at the present time.  He did not have any evidence of upper respiratory tract infection back in June or any other etiology to explain his mildly elevated troponin. He does not smoke and does not drink alcohol.  There is no family history of premature coronary artery disease.   Past Medical History:  Diagnosis Date  . Angiopathy, diabetic (HCC)   . Diabetes mellitus    Type 1, diagnosed age 33, with frequent DKA admissions, difficult to control due to MR  . Diabetic nephropathy (HCC)    Started on Lisinopril 5 mg  . Diabetic peripheral neuropathy (HCC)   . Dysmorphic features   . Goiter   . Hypertension   . Hypoglycemia associated with diabetes (HCC)   . Hypothyroidism   . Impaired cognition    mention of mental retardation  . Mental retardation   . Moderate or severe vision  impairment, both eyes, impairment level not further specified   . 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 Thyroiditis, autoimmune     Past Surgical History:  Procedure Laterality Date  . DENTAL SURGERY       Current Outpatient Medications  Medication Sig Dispense Refill  . atorvastatin (LIPITOR) 10 MG tablet TAKE 1 TABLET (10 MG TOTAL) BY MOUTH DAILY. 90 tablet 0  . glucose blood (ONETOUCH VERIO) test strip USE TO CHECK BLOOD SUGAR 3 TIMES DAILY AS DIRECTED 100 each 5  . Insulin Lispro Prot & Lispro (HUMALOG MIX 75/25 KWIKPEN) (75-25) 100 UNIT/ML Kwikpen INJECT 34 UNITS OF INSULIN IN THE MORNING AND 20 UNITS IN THE EVENING (Patient taking differently: INJECT 28 UNITS OF INSULIN IN THE MORNING AND 24 UNITS IN THE EVENING) 15 mL 5  . lisinopril (ZESTRIL) 10 MG tablet TAKE 1 TABLET BY MOUTH EVERY DAY 90 tablet 1  . SYNTHROID 25 MCG tablet TAKE 1 TABLET (25 MCG TOTAL) BY MOUTH DAILY BEFORE BREAKFAST. 90 tablet 1  . aspirin EC 81 MG tablet Take 1 tablet (81 mg total) by mouth daily. 90 tablet 3   No current facility-administered medications for this visit.     Allergies:   Patient has no known allergies.    Social History:  The patient  reports that he has never smoked. He has never used smokeless tobacco. He reports  that he does not drink alcohol or use drugs.   Family History:  The patient's family history includes Diabetes in his cousin, maternal grandmother, and paternal grandmother; Retinitis pigmentosa in his mother; Vision loss in his father.    ROS:  Please see the history of present illness.   Otherwise, review of systems are positive for none.   All other systems are reviewed and negative.    PHYSICAL EXAM: VS:  BP 128/82 (BP Location: Right Arm, Patient Position: Sitting, Cuff Size: Normal)   Pulse 63   Temp 97.6 F (36.4 C)   Ht 5\' 7"  (1.702 m)   Wt 143 lb 8 oz (65.1 kg)   SpO2 98%   BMI 22.48 kg/m  , BMI Body mass index is 22.48 kg/m. GEN: Well  nourished, well developed, in no acute distress  HEENT: normal  Neck: no JVD, carotid bruits, or masses Cardiac: RRR; no murmurs, rubs, or gallops,no edema  Respiratory:  clear to auscultation bilaterally, normal work of breathing GI: soft, nontender, nondistended, + BS MS: no deformity or atrophy  Skin: warm and dry, no rash Neuro:  Strength and sensation are intact Psych: euthymic mood, full affect   EKG:  EKG is ordered today. The ekg ordered today demonstrates normal sinus rhythm with no significant ST or T wave changes.   Recent Labs: 02/23/2018: TSH 5.00 08/01/2018: ALT 13 11/06/2018: BUN 18; Creatinine, Ser 1.25; Hemoglobin 14.1; Platelets 222; Potassium 3.9; Sodium 140    Lipid Panel    Component Value Date/Time   CHOL 149 11/06/2018 0111   TRIG 82 11/06/2018 0111   HDL 41 11/06/2018 0111   CHOLHDL 3.6 11/06/2018 0111   VLDL 16 11/06/2018 0111   LDLCALC 92 11/06/2018 0111   LDLCALC 124 (H) 08/01/2018 0000      Wt Readings from Last 3 Encounters:  12/25/18 143 lb 8 oz (65.1 kg)  12/14/18 139 lb 9.6 oz (63.3 kg)  11/06/18 141 lb 14.4 oz (64.4 kg)       PAD Screen 12/25/2018  Previous PAD dx? No  Previous surgical procedure? No  Pain with walking? No  Feet/toe relief with dangling? No  Painful, non-healing ulcers? No  Extremities discolored? No      ASSESSMENT AND PLAN:  1.  Elevated troponin: The patient had mildly elevated troponin in June in the setting of chest pain.  The exact etiology is not entirely clear although myocardial infarction cannot be completely excluded.  Nonetheless, his noninvasive work-up including an echocardiogram and Lexiscan Myoview were overall unremarkable.  Also he has not had any recurrent symptoms since June.  Myocarditis is unlikely given the lack of infectious symptoms.  Also there was nothing to cause supply demand ischemia.   Myocardial infarction due to small vessel disease is a possibility. Given that he does not have any  recurrent symptoms and his noninvasive work-up was reassuring, I think it is reasonable to continue medical therapy for now.  I explained to him and his mother that if he develops recurrent symptoms, the next step is to proceed with cardiac catheterization. For now, I asked him to stop taking aspirin 81 mg once daily.  2.  Hyperlipidemia: Currently on atorvastatin.    Disposition:   FU with me in 6 months  Signed,  Kathlyn Sacramento, MD  12/25/2018 3:42 PM    [place]

## 2018-12-25 NOTE — Patient Instructions (Signed)
Medication Instructions:  Your physician has recommended you make the following change in your medication:  1- START Aspirin 81 mg by mouth once a day.  If you need a refill on your cardiac medications before your next appointment, please call your pharmacy.   Lab work: - None ordered.  If you have labs (blood work) drawn today and your tests are completely normal, you will receive your results only by: Marland Kitchen MyChart Message (if you have MyChart) OR . A paper copy in the mail If you have any lab test that is abnormal or we need to change your treatment, we will call you to review the results.  Testing/Procedures: - None ordered.   Follow-Up: At [facility], you and your health needs are our priority.  As part of our continuing mission to provide you with exceptional heart care, we have created designated Provider Care Teams.  These Care Teams include your primary Cardiologist (physician) and Advanced Practice Providers (APPs -  Physician Assistants and Nurse Practitioners) who all work together to provide you with the care you need, when you need it. You will need a follow up appointment in 6 months.  Please call our office 2 months in advance to schedule this appointment.  You may see DR Kathlyn Sacramento or one of the following Advanced Practice Providers on your designated Care Team:   Murray Hodgkins, NP Christell Faith, PA-C . Marrianne Mood, PA-C

## 2019-02-13 DIAGNOSIS — H3552 Pigmentary retinal dystrophy: Secondary | ICD-10-CM | POA: Diagnosis not present

## 2019-02-13 DIAGNOSIS — H2513 Age-related nuclear cataract, bilateral: Secondary | ICD-10-CM | POA: Diagnosis not present

## 2019-02-13 DIAGNOSIS — E109 Type 1 diabetes mellitus without complications: Secondary | ICD-10-CM | POA: Diagnosis not present

## 2019-02-13 DIAGNOSIS — H40033 Anatomical narrow angle, bilateral: Secondary | ICD-10-CM | POA: Diagnosis not present

## 2019-02-13 LAB — HM DIABETES EYE EXAM

## 2019-03-03 ENCOUNTER — Other Ambulatory Visit (INDEPENDENT_AMBULATORY_CARE_PROVIDER_SITE_OTHER): Payer: Self-pay | Admitting: "Endocrinology

## 2019-03-16 ENCOUNTER — Ambulatory Visit (INDEPENDENT_AMBULATORY_CARE_PROVIDER_SITE_OTHER): Payer: Medicare Other | Admitting: "Endocrinology

## 2019-04-25 ENCOUNTER — Other Ambulatory Visit (INDEPENDENT_AMBULATORY_CARE_PROVIDER_SITE_OTHER): Payer: Self-pay | Admitting: "Endocrinology

## 2019-04-25 DIAGNOSIS — E1065 Type 1 diabetes mellitus with hyperglycemia: Secondary | ICD-10-CM

## 2019-05-01 ENCOUNTER — Ambulatory Visit (INDEPENDENT_AMBULATORY_CARE_PROVIDER_SITE_OTHER): Payer: Medicare Other | Admitting: "Endocrinology

## 2019-05-07 ENCOUNTER — Other Ambulatory Visit (INDEPENDENT_AMBULATORY_CARE_PROVIDER_SITE_OTHER): Payer: Self-pay

## 2019-05-07 ENCOUNTER — Telehealth (INDEPENDENT_AMBULATORY_CARE_PROVIDER_SITE_OTHER): Payer: Self-pay | Admitting: "Endocrinology

## 2019-05-07 DIAGNOSIS — E063 Autoimmune thyroiditis: Secondary | ICD-10-CM

## 2019-05-07 MED ORDER — SYNTHROID 25 MCG PO TABS: 25.0000 ug | ORAL_TABLET | Freq: Every day | ORAL | 1 refills | Status: DC

## 2019-05-07 NOTE — Telephone Encounter (Signed)
  Who's calling (name and relationship to patient) : [name], [name] Best contact number: [phone] Provider they see: Tobe Sos Reason for call:     PRESCRIPTION REFILL ONLY  Name of prescription: Synthroid  Pharmacy: [facility], [facility]

## 2019-06-15 ENCOUNTER — Ambulatory Visit (INDEPENDENT_AMBULATORY_CARE_PROVIDER_SITE_OTHER): Payer: Medicare Other | Admitting: "Endocrinology

## 2019-06-15 ENCOUNTER — Other Ambulatory Visit: Payer: Self-pay

## 2019-06-15 ENCOUNTER — Encounter (INDEPENDENT_AMBULATORY_CARE_PROVIDER_SITE_OTHER): Payer: Self-pay | Admitting: "Endocrinology

## 2019-06-15 VITALS — BP 140/100 | HR 80 | Ht 64.53 in | Wt 146.0 lb

## 2019-06-15 DIAGNOSIS — E1043 Type 1 diabetes mellitus with diabetic autonomic (poly)neuropathy: Secondary | ICD-10-CM

## 2019-06-15 DIAGNOSIS — E1042 Type 1 diabetes mellitus with diabetic polyneuropathy: Secondary | ICD-10-CM

## 2019-06-15 DIAGNOSIS — E1065 Type 1 diabetes mellitus with hyperglycemia: Secondary | ICD-10-CM

## 2019-06-15 DIAGNOSIS — E11649 Type 2 diabetes mellitus with hypoglycemia without coma: Secondary | ICD-10-CM

## 2019-06-15 DIAGNOSIS — E063 Autoimmune thyroiditis: Secondary | ICD-10-CM

## 2019-06-15 DIAGNOSIS — I249 Acute ischemic heart disease, unspecified: Secondary | ICD-10-CM

## 2019-06-15 DIAGNOSIS — E1029 Type 1 diabetes mellitus with other diabetic kidney complication: Secondary | ICD-10-CM

## 2019-06-15 DIAGNOSIS — R000 Tachycardia, unspecified: Secondary | ICD-10-CM

## 2019-06-15 DIAGNOSIS — E049 Nontoxic goiter, unspecified: Secondary | ICD-10-CM

## 2019-06-15 DIAGNOSIS — E782 Mixed hyperlipidemia: Secondary | ICD-10-CM

## 2019-06-15 DIAGNOSIS — E1151 Type 2 diabetes mellitus with diabetic peripheral angiopathy without gangrene: Secondary | ICD-10-CM

## 2019-06-15 DIAGNOSIS — R231 Pallor: Secondary | ICD-10-CM

## 2019-06-15 DIAGNOSIS — I10 Essential (primary) hypertension: Secondary | ICD-10-CM

## 2019-06-15 DIAGNOSIS — R809 Proteinuria, unspecified: Secondary | ICD-10-CM

## 2019-06-15 LAB — POCT GLUCOSE (DEVICE FOR HOME USE): Glucose Fasting, POC: 196 mg/dL — AB (ref 70–99)

## 2019-06-15 LAB — POCT GLYCOSYLATED HEMOGLOBIN (HGB A1C): Hemoglobin A1C: 9 % — AB (ref 4.0–5.6)

## 2019-06-15 NOTE — Patient Instructions (Signed)
Follow up visit in 3 months. 

## 2019-06-15 NOTE — Progress Notes (Signed)
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  HISTORY OF PRESENT ILLNESS:   [name] is a 34 y.o. Caucasian young man.  [name] was accompanied by his sister, [name] SetaHeather..       1. I first consulted on [name] when he was admitted to the Pediatric Ward at the [facility] in May 2007.  A. The patient was diagnosed with T1DM at age 34 in about 2001 or 2002. We have few details about his medical care until 2007. We know that in about 2006 he saw a pediatric endocrinologist at [facility], [facility], [facility]. Unfortunately, the family chose not to return to see the endocrinologist. In about April of 2007, his primary care provider, Dr. Reed BreechPriebe of [facility], started the patient on an insulin pump. Unfortunately, neither the patient nor the family were capable of utilizing the pump's technology effectively.   B. On 10/02/2005 the patient was admitted to the [facility] for poorly controlled type 1 diabetes. I consulted on the patient at that time. 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 He did not have the insight or intelligence to perform the daily tasks of diabetes self-care independently or to utilize the insulin pump. It also appeared that there was not a great deal of adult supervision and support within his family. We tried to teach him and his family how to follow a multiple daily injection of insulin plan with Lantus and Novolog insulin, but that attempt failed rapidly. I changed his  regimen to Novolog Mix 70/30 insulin taken twice daily. He was to take 26 units each morning at breakfast and 14 units each evening at supper. I also gave him a sliding scale for Humalog lispro with different doses of insulin to be used at breakfast, lunch, supper, and bedtime in addition to his 70/30 insulin when his family members were available to check the blood sugars and give the additional insulin. The patient's family members agreed to supervise his care.  2. Unfortunately, [name] has frequently not had the adult supervision and support that he needed. In the last 12 years, the patient has been scheduled to return to our clinic for follow up visits approximately every 3 months, but has actually only returned to our clinic for follow up visits 1-4 times per year, although the family has done much better in the past three years. In 2012 he had 3 visits and was admitted once. In 2013 he had one admission in December, followed by a clinic visit later that month. In 2014 he had 4 clinic visits. In 2015 he had two clinic visits. In fairness, however, I should note that I was unavailable to see him for two months in 2015 due to my severe leg injury and post-operative recuperation. He had 4 visits in 2016 and 4 visits in 2017, but in 2018 he only had 3 visits. Today is his fourth visit in 2019. Since moving in with his sister and her family in January 2019, the adult supervision and support in Remyhris' life have substantially improved.  3. During the past 13 years [name] has had multiple medical problems:  A. T1DM: His hemoglobin A1c  values have been mostly in the 9.0-14.0% range. He has the following complications of diabetes: diabetic angiopathy, peripheral neuropathy, autonomic neuropathy, inappropriate sinus tachycardia, gastroparesis and postprandial abdominal bloating, chronic constipation, urinary microalbuminuria, and occasional but significant hypoglycemia. Due to health insurance restrictions he has taken  Novolog 70/30 Mix insulin at some times and Humalog 75/25 Mix insulin at other times.   B. Autoimmune hypothyroidism: In November 2007, the patient was diagnosed with hypothyroidism secondary to Hashimoto's disease and was started on Synthroid at a dose of 25 mcg per day.   C. Hypertension: In November 2008, he was diagnosed with hypertension and started on lisinopril, 5 mg per day.   D. Hypercholesterolemia: In July 2014 he was diagnosed with hypercholesterolemia and started on atorvastatin, 10 mg/day.  E. Noncompliance/medical neglect/inadequate medical supervision: Unfortunately, [name] Ohm has frequently missed BG checks, doses of insulin, and doses of his oral medications. Parental/family supervision and support have varied in consistency and in quality throughout these years. However, since [name] Ohm has been living with [name] and her family, the supervision and support have been much better.  [name] had an episode of chest pain and pallor on 11/05/18. He was admitted to [facility] where he was diagnosed with acute coronary syndrome. That episode scared him and caused him to try to be somewhat more compliant with his DM care plan.   4. [name] Ohm' last [facility] clinic visit was on 12/14/18.  At that visit we continued his Humalog Mix 75/25 insulin doses of 28 units in the mornings and 24 units in the evenings.    A. In the interim he has been healthy. He has not had any further acute chest pains.   B. BG control has still been quite variable. [name] says that he has had more higher BGs recently. His morning BGs are usually pre-prandial. Later BGs may be pre-prandial or post-prandial. Since his last visit he has not had many low BGs.   C. He has been trying to eat better, but has not been exercising much during the colder weather.  D. He continues on his morning 75/35 insulin dose of 28 units and his evening dose of 24 units.  [name] says he is not missing many insulin doses.    E. His dentures are working well.   [name]' sister, [name], now supervises his care in her home more frequently.    G. He now has both Medicaid and Medicare coverage, however, [place] Medicaid will no longer pay for medications. He receives his insulins and DM supplies through [facility].    H.  He is not doing much itching and scratching.   I. He is supposed to be taking 25 mcg of Synthroid daily, 10 mg of lisinopril daily, and 10 mg of atorvastatin daily. [name] says he is doing better at taking the medications.    5. Pertinent Review of Systems:  Constitutional: The patient feels "good overall". [name] says that he is doing better with his irritability, depression, and willingness to cooperate with his DM care plan, but some days go better than others.  However, when he becomes hypoglycemic or extremely hyperglycemic, he can still be very irritable and sometimes even combative.  Eyes: Vision is about the same. He is legally blind. He had his annual eye exam in October or November 2019. He was told that his diabetes is not interfering with his eyes.  Neck: The patient has no complaints of anterior neck swelling, soreness, tenderness, pressure, discomfort, or difficulty swallowing.  Heart:  The patient has no other complaints of palpitations, irregular heat beats, chest pain, or chest pressure. Gastrointestinal: He occasionally has post-prandial bloating, but is no longer constipated. The patient has no other complaints of excessive hunger, acid reflux, stomach aches or pains, diarrhea, or constipation. Legs: He sometimes has pains in the right upper thigh. Muscle mass and strength seem normal. There are no other complaints of numbness, tingling, burning, or pain. No edema is noted. Feet: [name] Ohm has not had any recent tingling in his feet. He says he no longer picks at his toe nails. There are no recent complaints of numbness, tingling, burning, or pain. No edema is noted. GU: He rarely has nocturia. Hypoglycemia: He has not had many  low BGs in the past month.    .    6. BG meter review: We have data from the past 4 weeks. He checks BGs 2-4 times daily, average 3.3 times. Average BG is 255, range 55-601. He has had 5 BGs <80 between midnight and breakfast. On 3 of those occasions, he had not checked his BGs the night before. On two occasions when he did check BGs, the BGs at bedtime were <200, but he did not take a snack. On other evenings he takes too much of a snack and has higher BGs the next morning. He has had one Hi (>600), one 566, and 4 BGs in the 400s. He has had many BGs in the 80-200 range.   PAST MEDICAL, FAMILY, AND SOCIAL HISTORY:  Past Medical History:  Diagnosis Date  . Angiopathy, diabetic (HCC)   . Diabetes mellitus    Type 1, diagnosed age 64, with frequent DKA admissions, difficult to control due to MR  . Diabetic nephropathy (HCC)    Started on Lisinopril 5 mg  . Diabetic peripheral neuropathy (HCC)   . Dysmorphic features   . Goiter   . Hypertension   . Hypoglycemia associated with diabetes (HCC)   . Hypothyroidism   . Impaired cognition    mention of mental retardation  . Mental retardation   . Moderate or severe vision impairment, both eyes, impairment level not further specified   . 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 Thyroiditis, autoimmune     Family History  Problem Relation Age of Onset  . Vision loss Father   . Retinitis pigmentosa Mother   . Diabetes Maternal Grandmother        AODM  . Diabetes Paternal Grandmother        AODM  . Diabetes Cousin        Second cousin has juvenile-onset DM.     Current Outpatient Medications:  .  aspirin EC 81 MG tablet, Take 1 tablet (81 mg total) by mouth daily., Disp: 90 tablet, Rfl: 3 .  atorvastatin (LIPITOR) 10 MG tablet, TAKE 1 TABLET (10 MG TOTAL) BY MOUTH DAILY., Disp: 90 tablet, Rfl: 0 .  glucose blood (ONETOUCH VERIO) test strip, USE TO CHECK BLOOD SUGAR 3 TIMES DAILY AS DIRECTED, Disp: 100 each, Rfl: 5 .   Insulin Lispro Prot & Lispro (HUMALOG MIX 75/25 KWIKPEN) (75-25) 100 UNIT/ML Kwikpen, INJECT 28 UNITS OF INSULIN IN THE MORNING AND 24 UNITS IN THE EVENING, Disp: 15 mL, Rfl: 5 .  lisinopril (ZESTRIL) 10 MG tablet, TAKE 1 TABLET BY MOUTH EVERY DAY, Disp: 90 tablet, Rfl: 1 .  SYNTHROID 25 MCG tablet, Take 1 tablet (25 mcg total) by mouth daily before breakfast., Disp: 90 tablet, Rfl: 1  Allergies as of 06/15/2019  . (No Known Allergies)    1. Work and Family: [name] remains unemployed. He is still living with his sister, [name], and her family. [name] stays at home and so can look after [name]. [name]' friend, Delfino Lovett, also lives in the house. [name] and Richard help each other out and exercise together. Mom has gone back to work.  2. Activities: He has been walking and riding his bike less frequently in the colder weather.  3. Smoking, alcohol, or drugs: No tobacco or alcohol or drugs.  4. Primary Care Provider: None at present  REVIEW OF SYSTEMS: There are no other significant problems involving Yaden's other body systems.   Objective:  Vital Signs:  BP (!) 140/100   Pulse 80   Ht 5' 4.53" (1.639 m)   Wt 146 lb (66.2 kg)   BMI 24.65 kg/m    Ht Readings from Last 3 Encounters:  06/15/19 5' 4.53" (1.639 m)  12/25/18 5\' 7"  (1.702 m)  12/14/18 5' 4.5" (1.638 m)   Wt Readings from Last 3 Encounters:  06/15/19 146 lb (66.2 kg)  12/25/18 143 lb 8 oz (65.1 kg)  12/14/18 139 lb 9.6 oz (63.3 kg)   Body surface area is 1.74 meters squared.  Facility age limit for growth percentiles is 20 years. Facility age limit for growth percentiles is 20 years.  PHYSICAL EXAM:  Constitutional: The patient appears healthy, but his gait is still somewhat wide-based and shuffling due to his visual handicap. His weight has increased by 3 pounds. He was engaged and interactive today. He still does not really remember much about checking his BGs and taking his insulins. His thought processes  are still very concrete. His insight remains poor. He really does not understand or comprehend much about diabetes, about why he needs to take care of himself, and about what will happen to him if he does not take care of himself.  Face: The face is somewhat dysmorphic. His face is very similar to mom's face and [name]'s face. Eyes: There is no obvious arcus or proptosis. His eyes are slightly dry.  Mouth: The oropharynx appears normal. His geographic tongue has resolved. He no longer has any maxillary teeth, but he is wearing his new dentures. His mouth is normally moist.   Neck: The neck appears to be visibly normal. No carotid bruits are noted. The thyroid gland is again mildly enlarged at about 21 grams today. The right lobe is top-normal size, while the left lobe is mildly enlarged. The consistency of the thyroid gland is normal today. The thyroid gland is not tender to palpation. Lungs: The lungs are clear to auscultation. Air movement is good. Heart: Heart rate is rapid, but rhythm is regular. Heart sounds S1 and S2 are normal. I did not appreciate any pathologic cardiac murmurs. Abdomen: The abdomen is normal in size, but he has more adipose tissue today. Bowel sounds are normal. There is no obvious hepatomegaly, splenomegaly, or other mass effect.  Arms: Muscle size and bulk are normal for age. He has no new areas of excoriation on his arms. The older areas are healing or have healed.  Hands: There is no obvious tremor. Phalangeal and metacarpophalangeal joints are normal. Palmar muscles are normal. Palmar skin is normal. Palmar moisture is also normal. Nail beds are pallid. Legs: Muscles appear normal for age. No edema is present. Feet: Feet are normally formed.DP and PT pulses are trace. His left great toenail is healing from his [prior  stubbing injury.  Neurologic: Strength is normal for age in both the upper and lower extremities. Muscle tone is normal. Sensation to touch is normal in both  the legs and feet.    LAB DATA:   Labs 06/15/19: HbA1c 9.0%, CBG 196  Labs 12/14/18: HbA1c 9.7%, CBG 304  Labs 08/01/18: HbA1c 8.9%, CBG 89  Labs 02/23/18: HbA1c 8.8%, CBG 201; TSH 5.00, free T4 1.1, free T3 3.2;   Labs 11/23/17: CBG 338  Labs 09/26/17: HbA1c 10.9%, CBG 364, urine glucose 2000, urine ketones negative  Labs 06/27/17: HbA1c 9.0%, CBG 134; TSH 1.49, free T4 1.2, free T3 3.1; CMP normal, except for glucose of 213; cholesterol 134, triglycerides 79, HDL 53, LDL 65; urinary microalbumin/creatinine ratio was 748   Labs 12/31/15: HbA1c 11.7%, CBG 145  Labs 09/29/16: HbA1c 10.2%, CBG 70  Labs 06/01/16: HbA1c >14%. CBG 288; TSH 2.65, free T4 1.2, free T3 2.7; cholesterol 201, triglycerides 106, HDL 54, LDL 127; CMP normal except glucose 130 and albumin 3.5.   Labs 01/05/16: HbA1c 11.0%  Labs 11/04/15: HbA1c 10.5%  Labs 09/12/15: HbA1c 10.3%; CMP normal except for glucose 149 and sodium 133; cholesterol 190, triglycerides 90, HDL 55, LDL 117; urinary microalbumin/creatinine ratio 190; TSH 3.09, free T4 1.3, free T3 3.8  Labs 07/16/15: HbA1c 10.6%, Urine dipstick shows trace ketones  Labs 04/09/15: HbA1c 10.4%  Labs 09/09/14: HbA1c 11.9%; TSH 1.822, free T4 1.02, and free T3 2.6; CMP normal except for glucose of 273; cholesterol 174, triglycerides 181, HDL 46, LDL 92; urinary microalbumin/creatinine ratio 139.2  Labs 06/19/14: HbA1c 10.7%  Labs 05/12/14: CMP normal  Labs 02/19/14: HbA1c 10.2%  Labs 07/31/13: HbA1c 8.5%; CMP normal, except glucose 269; microalbumin/creatinine ratio 51.6; TSH 1.885, free T4 1.31, free T3 3.4   Labs 7/018/14: CMP normal, except glucose of 14; cholesterol 202, triglycerides 85, HDL 58, LDL 127; urinary microalbumin/creatinine ratio 51.9; TSH 2.993, free T4 1.32, free T3 2.8  05/10/12: Creatinine 0.62, TSH 1.025,            Assessment and Plan:   ASSESSMENT:  1-2. Diabetes mellitus/hypoglycemia:   A. The patient's HbA1c is lower today, but is  still too high. He is being more consistent with taking his insulins. [name] is supervising more.   B. He has been having more nocturnal hypoglycemia associated with either not checking BGs at bedtime or not taking adequate bedtime snacks.   [name] needs continuing adult supervision.  3. Hypertension: Blood pressure is higher today. He did not take his medication this morning and may have missed other doses. He also has not been exercising.    4. Peripheral neuropathy: This problem has improved and is not evident today, but will certainly recur if he does not get his BGs under better control.   5. Angiopathy: His angiopathy is still fairly severe. He needs to walk daily. If he exercises on a regular basis and controls his BGs, the blood flow in his legs and feet will likely improve. 6. Goiter/thyroiditis: Thyroid gland is still mildly enlarged today. The process of waxing and waning of thyroid gland size is c/w evolving Hashimoto's thyroiditis.   7. Hypothyroid: He was mid-range euthyroid in April 2016, but borderline low in April 2017. His TSH in January 2018 was better, but still above the goal range of 1.0-2.0. His TSH in February 2019 was within the goal range. His TFTs in October 2019 were low, largely due to missing Synthroid doses. He needs to take his Synthroid daily.  We need to repeat his TFTs today.  8. Autonomic neuropathy, tachycardia, bloating, and gastroparesis: His heart rate is normal and he is not having many GI symptoms.   9. Combined hyperlipidemia: His lipids in February 2019 were normal on his current dose of atorvastatin.  10. Weight loss, unintentional: This process had resolved with him taking more insulin and more food, but can recur when he is not as compliant with his DM care plan.    11. Dehydration: He is not dehydrated today.  12. Medical neglect: [name]' sister, [name] SetaHeather, and her husband have really been trying to supervise [name] Ohmhris, although he often makes it difficult for  them to do so. Overall [name]' DM care is much better with [name]'s supervision.  13. 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  14. Microalbuminuria/macroalbuminuria: His microalbumin/creatinine ratio in February 2019 was the highest that it has been in the past four nears. He needs better control of both his BGs and his BPs. He still needs to take his lisinopril every day.   15. Acute coronary syndrome: This event scared [name] Ohmhris. Since [name] does not have a PCP, I will [facility] for further E&M. He has not had a recurrence.  16. Pallor of nail beds: We need to check his CBC and iron.   PLAN:  1. Diagnostic: HbA1c and CBG today. Obtain annual surveillance labs even in a non-fasting state now. Bring in BG meter in two weeks for download. Refer to cardiology.  2. Therapeutic:   A. Continue  the Humalog Mix 75/25 insulin dose in the mornings of 28 units. Continue  the dinner insulin dose to 24 units. However, if the morning BG is <100, reduce the morning insulin dose to 26 units. If the dinner BG is <100, reduce the dinner insulin dose to 23 units. If the BG at bedtime is <200, follow the Small column bedtime snack plan.    B. Take oral medications once a day, at whatever time the family can fit in the doses.   C. I asked that [name] directly supervise his BG checks, insulin dosing, and oral meds.  3. Patient education: We discussed all of the above. I asked [name] again to please cooperate with his family members. 4. Follow-up: 3 months. Ensure that [name] SetaHeather can attend.   Level of Service: This visit lasted in excess of 60 minutes. More than 50% of the visit was devoted to counseling.  Molli KnockMichael Kerin Kren, MD 06/15/2019 9:24 AM

## 2019-06-16 LAB — COMPREHENSIVE METABOLIC PANEL
AG Ratio: 1.4 (calc) (ref 1.0–2.5)
ALT: 20 U/L (ref 9–46)
AST: 17 U/L (ref 10–40)
Albumin: 3.7 g/dL (ref 3.6–5.1)
Alkaline phosphatase (APISO): 62 U/L (ref 36–130)
BUN: 15 mg/dL (ref 7–25)
CO2: 29 mmol/L (ref 20–32)
Calcium: 9.4 mg/dL (ref 8.6–10.3)
Chloride: 106 mmol/L (ref 98–110)
Creat: 1.13 mg/dL (ref 0.60–1.35)
Globulin: 2.7 g/dL (calc) (ref 1.9–3.7)
Glucose, Bld: 89 mg/dL (ref 65–99)
Potassium: 4.7 mmol/L (ref 3.5–5.3)
Sodium: 143 mmol/L (ref 135–146)
Total Bilirubin: 0.5 mg/dL (ref 0.2–1.2)
Total Protein: 6.4 g/dL (ref 6.1–8.1)

## 2019-06-16 LAB — CBC WITH DIFFERENTIAL/PLATELET
Absolute Monocytes: 502 cells/uL (ref 200–950)
Basophils Absolute: 31 cells/uL (ref 0–200)
Basophils Relative: 0.5 %
Eosinophils Absolute: 118 cells/uL (ref 15–500)
Eosinophils Relative: 1.9 %
HCT: 44.2 % (ref 38.5–50.0)
Hemoglobin: 15.1 g/dL (ref 13.2–17.1)
Lymphs Abs: 1420 cells/uL (ref 850–3900)
MCH: 31.9 pg (ref 27.0–33.0)
MCHC: 34.2 g/dL (ref 32.0–36.0)
MCV: 93.2 fL (ref 80.0–100.0)
MPV: 10.6 fL (ref 7.5–12.5)
Monocytes Relative: 8.1 %
Neutro Abs: 4129 cells/uL (ref 1500–7800)
Neutrophils Relative %: 66.6 %
Platelets: 224 10*3/uL (ref 140–400)
RBC: 4.74 10*6/uL (ref 4.20–5.80)
RDW: 12.3 % (ref 11.0–15.0)
Total Lymphocyte: 22.9 %
WBC: 6.2 10*3/uL (ref 3.8–10.8)

## 2019-06-16 LAB — LIPID PANEL
Cholesterol: 137 mg/dL (ref <200)
HDL: 46 mg/dL (ref >=40)
LDL Cholesterol (Calc): 73 mg/dL (calc)
Non-HDL Cholesterol (Calc): 91 mg/dL (calc) (ref <130)
Total CHOL/HDL Ratio: 3 (calc) (ref <5.0)
Triglycerides: 96 mg/dL (ref <150)

## 2019-06-16 LAB — TSH: TSH: 1.98 mIU/L (ref 0.40–4.50)

## 2019-06-16 LAB — MICROALBUMIN / CREATININE URINE RATIO
Creatinine, Urine: 147 mg/dL (ref 20–320)
Microalb Creat Ratio: 1599 mcg/mg creat — ABNORMAL HIGH (ref <30)
Microalb, Ur: 235.1 mg/dL

## 2019-06-16 LAB — T3, FREE: T3, Free: 3.4 pg/mL (ref 2.3–4.2)

## 2019-06-16 LAB — T4, FREE: Free T4: 1.2 ng/dL (ref 0.8–1.8)

## 2019-06-18 ENCOUNTER — Encounter (INDEPENDENT_AMBULATORY_CARE_PROVIDER_SITE_OTHER): Payer: Self-pay | Admitting: *Deleted

## 2019-06-21 ENCOUNTER — Telehealth (INDEPENDENT_AMBULATORY_CARE_PROVIDER_SITE_OTHER): Payer: Self-pay | Admitting: "Endocrinology

## 2019-06-21 NOTE — Telephone Encounter (Signed)
Who's calling (name and relationship to patient) : [name]   Best contact number: [phone]  Provider they see: Dr. Fransico Michael  Reason for call: [name] called for the order confirmation of the free style libre 14 day glucose meter.  Call ID:      PRESCRIPTION REFILL ONLY  Name of prescription:  Pharmacy:

## 2019-06-28 NOTE — Telephone Encounter (Signed)
Records faxed this am.

## 2019-06-28 NOTE — Telephone Encounter (Signed)
[name] called again to follow up on the previous note. Please send office notes

## 2019-07-03 ENCOUNTER — Other Ambulatory Visit (INDEPENDENT_AMBULATORY_CARE_PROVIDER_SITE_OTHER): Payer: Self-pay | Admitting: *Deleted

## 2019-07-03 DIAGNOSIS — R000 Tachycardia, unspecified: Secondary | ICD-10-CM

## 2019-07-04 ENCOUNTER — Ambulatory Visit: Payer: Medicare Other | Admitting: Family

## 2019-07-11 ENCOUNTER — Telehealth: Payer: Self-pay | Admitting: Cardiovascular Disease

## 2019-07-11 NOTE — Telephone Encounter (Signed)

## 2019-07-12 ENCOUNTER — Encounter: Payer: Self-pay | Admitting: Family

## 2019-07-12 ENCOUNTER — Other Ambulatory Visit: Payer: Self-pay

## 2019-07-12 ENCOUNTER — Telehealth (INDEPENDENT_AMBULATORY_CARE_PROVIDER_SITE_OTHER): Payer: Medicare Other | Admitting: Family

## 2019-07-12 VITALS — Ht 64.0 in | Wt 150.0 lb

## 2019-07-12 DIAGNOSIS — E782 Mixed hyperlipidemia: Secondary | ICD-10-CM | POA: Diagnosis not present

## 2019-07-12 DIAGNOSIS — R778 Other specified abnormalities of plasma proteins: Secondary | ICD-10-CM

## 2019-07-12 DIAGNOSIS — I10 Essential (primary) hypertension: Secondary | ICD-10-CM | POA: Diagnosis not present

## 2019-07-12 NOTE — Patient Instructions (Addendum)
Medication Instructions:  No medication changes today.  *If you need a refill on your cardiac medications before your next appointment, please call your pharmacy*  Lab Work: No lab work ordered today. Labs from Dr. Tobe Sos looked good!  Testing/Procedures: None ordered today.   Follow-Up: At [facility], you and your health needs are our priority.  As part of our continuing mission to provide you with exceptional heart care, we have created designated Provider Care Teams.  These Care Teams include your primary Cardiologist (physician) and Advanced Practice Providers (APPs -  Physician Assistants and Nurse Practitioners) who all work together to provide you with the care you need, when you need it.  Your next appointment:   Follow up in 2-4 weeks in office  You may see Kathlyn Sacramento, MD or one of the following Advanced Practice Providers on your designated Care Team:    Murray Hodgkins, NP  Christell Faith, PA-C  Marrianne Mood, PA-C  Laurann Montana, NP  Other Instructions   It was a pleasure to speak with you today! Thank you for your flexibility with a switch to telephone visit due to the weather. We appreciate it!   Recommend purchasing a blood pressure cuff. Look online or in store for am upper-arm cuff. These are most accurate. Omron is a good brand, but whatever is easiest to purchase is fine. Should be approximately $25-$40 dollars. This will allow you to check your blood pressure and heart rate at home.    We will see in you in the office in a few weeks to check your blood pressure.   Your cardiac (heart) testing in June in the hospital was good. Your stress test showed no ischemia which means no blockages. Your echocardiogram (the ultrasound of your heart) showed that your heart muscle is strong. Our goal is to keep your heart healthy. We do this with regular exercise and diet. Diet recommendations included below.    DASH Eating Plan DASH stands for "Dietary  Approaches to Stop Hypertension." The DASH eating plan is a healthy eating plan that has been shown to reduce high blood pressure (hypertension). It may also reduce your risk for type 2 diabetes, heart disease, and stroke. The DASH eating plan may also help with weight loss. What are tips for following this plan?  General guidelines  Avoid eating more than 2,300 mg (milligrams) of salt (sodium) a day. If you have hypertension, you may need to reduce your sodium intake to 1,500 mg a day.  Limit alcohol intake to no more than 1 drink a day for nonpregnant women and 2 drinks a day for men. One drink equals 12 oz of beer, 5 oz of wine, or 1 oz of hard liquor.  Work with your health care provider to maintain a healthy body weight or to lose weight. Ask what an ideal weight is for you.  Get at least 30 minutes of exercise that causes your heart to beat faster (aerobic exercise) most days of the week. Activities may include walking, swimming, or biking.  Work with your health care provider or diet and nutrition specialist (dietitian) to adjust your eating plan to your individual calorie needs. Reading food labels   Check food labels for the amount of sodium per serving. Choose foods with less than 5 percent of the Daily Value of sodium. Generally, foods with less than 300 mg of sodium per serving fit into this eating plan.  To find whole grains, look for the word "whole" as the first  word in the ingredient list. Shopping  Buy products labeled as "low-sodium" or "no salt added."  Buy fresh foods. Avoid canned foods and premade or frozen meals. Cooking  Avoid adding salt when cooking. Use salt-free seasonings or herbs instead of table salt or sea salt. Check with your health care provider or pharmacist before using salt substitutes.  Do not fry foods. Cook foods using healthy methods such as baking, boiling, grilling, and broiling instead.  Cook with heart-healthy oils, such as olive, canola,  soybean, or sunflower oil. Meal planning  Eat a balanced diet that includes: ? 5 or more servings of fruits and vegetables each day. At each meal, try to fill half of your plate with fruits and vegetables. ? Up to 6-8 servings of whole grains each day. ? Less than 6 oz of lean meat, poultry, or fish each day. A 3-oz serving of meat is about the same size as a deck of cards. One egg equals 1 oz. ? 2 servings of low-fat dairy each day. ? A serving of nuts, seeds, or beans 5 times each week. ? Heart-healthy fats. Healthy fats called Omega-3 fatty acids are found in foods such as flaxseeds and coldwater fish, like sardines, salmon, and mackerel.  Limit how much you eat of the following: ? Canned or prepackaged foods. ? Food that is high in trans fat, such as fried foods. ? Food that is high in saturated fat, such as fatty meat. ? Sweets, desserts, sugary drinks, and other foods with added sugar. ? Full-fat dairy products.  Do not salt foods before eating.  Try to eat at least 2 vegetarian meals each week.  Eat more home-cooked food and less restaurant, buffet, and fast food.  When eating at a restaurant, ask that your food be prepared with less salt or no salt, if possible. What foods are recommended? The items listed may not be a complete list. Talk with your dietitian about what dietary choices are best for you. Grains Whole-grain or whole-wheat bread. Whole-grain or whole-wheat pasta. Brown rice. Orpah Cobb. Bulgur. Whole-grain and low-sodium cereals. Pita bread. Low-fat, low-sodium crackers. Whole-wheat flour tortillas. Vegetables Fresh or frozen vegetables (raw, steamed, roasted, or grilled). Low-sodium or reduced-sodium tomato and vegetable juice. Low-sodium or reduced-sodium tomato sauce and tomato paste. Low-sodium or reduced-sodium canned vegetables. Fruits All fresh, dried, or frozen fruit. Canned fruit in natural juice (without added sugar). Meat and other protein  foods Skinless chicken or [place]. Ground chicken or [place]. Pork with fat trimmed off. Fish and seafood. Egg whites. Dried beans, peas, or lentils. Unsalted nuts, nut butters, and seeds. Unsalted canned beans. Lean cuts of beef with fat trimmed off. Low-sodium, lean deli meat. Dairy Low-fat (1%) or fat-free (skim) milk. Fat-free, low-fat, or reduced-fat cheeses. Nonfat, low-sodium ricotta or cottage cheese. Low-fat or nonfat yogurt. Low-fat, low-sodium cheese. Fats and oils Soft margarine without trans fats. Vegetable oil. Low-fat, reduced-fat, or light mayonnaise and salad dressings (reduced-sodium). Canola, safflower, olive, soybean, and sunflower oils. Avocado. Seasoning and other foods Herbs. Spices. Seasoning mixes without salt. Unsalted popcorn and pretzels. Fat-free sweets. What foods are not recommended? The items listed may not be a complete list. Talk with your dietitian about what dietary choices are best for you. Grains Baked goods made with fat, such as croissants, muffins, or some breads. Dry pasta or rice meal packs. Vegetables Creamed or fried vegetables. Vegetables in a cheese sauce. Regular canned vegetables (not low-sodium or reduced-sodium). Regular canned tomato sauce and paste (not low-sodium or  reduced-sodium). Regular tomato and vegetable juice (not low-sodium or reduced-sodium). Rosita Fire. Olives. Fruits Canned fruit in a light or heavy syrup. Fried fruit. Fruit in cream or butter sauce. Meat and other protein foods Fatty cuts of meat. Ribs. Fried meat. Tomasa Blase. Sausage. Bologna and other processed lunch meats. Salami. Fatback. Hotdogs. Bratwurst. Salted nuts and seeds. Canned beans with added salt. Canned or smoked fish. Whole eggs or egg yolks. Chicken or [place] with skin. Dairy Whole or 2% milk, cream, and half-and-half. Whole or full-fat cream cheese. Whole-fat or sweetened yogurt. Full-fat cheese. Nondairy creamers. Whipped toppings. Processed cheese and cheese  spreads. Fats and oils Butter. Stick margarine. Lard. Shortening. Ghee. Bacon fat. Tropical oils, such as coconut, palm kernel, or palm oil. Seasoning and other foods Salted popcorn and pretzels. Onion salt, garlic salt, seasoned salt, table salt, and sea salt. Worcestershire sauce. Tartar sauce. Barbecue sauce. Teriyaki sauce. Soy sauce, including reduced-sodium. Steak sauce. Canned and packaged gravies. Fish sauce. Oyster sauce. Cocktail sauce. Horseradish that you find on the shelf. Ketchup. Mustard. Meat flavorings and tenderizers. Bouillon cubes. Hot sauce and Tabasco sauce. Premade or packaged marinades. Premade or packaged taco seasonings. Relishes. Regular salad dressings. Where to find more information:  National Heart, Lung, and Blood Institute: PopSteam.is  American Heart Association: www.heart.org Summary  The DASH eating plan is a healthy eating plan that has been shown to reduce high blood pressure (hypertension). It may also reduce your risk for type 2 diabetes, heart disease, and stroke.  With the DASH eating plan, you should limit salt (sodium) intake to 2,300 mg a day. If you have hypertension, you may need to reduce your sodium intake to 1,500 mg a day.  When on the DASH eating plan, aim to eat more fresh fruits and vegetables, whole grains, lean proteins, low-fat dairy, and heart-healthy fats.  Work with your health care provider or diet and nutrition specialist (dietitian) to adjust your eating plan to your individual calorie needs. This information is not intended to replace advice given to you by your health care provider. Make sure you discuss any questions you have with your health care provider. Document Revised: 04/22/2017 Document Reviewed: 05/03/2016 Elsevier Patient Education  2020 [employer].

## 2019-07-12 NOTE — Progress Notes (Signed)
Virtual Visit via Telephone Note   This visit type was conducted due to national recommendations for restrictions regarding the COVID-19 Pandemic (e.g. social distancing) in an effort to limit this patient's exposure and mitigate transmission in our community.  Due to his co-morbid illnesses, this patient is at least at moderate risk for complications without adequate follow up.  This format is felt to be most appropriate for this patient at this time.  The patient did not have access to video technology/had technical difficulties with video requiring transitioning to audio format only (telephone).  All issues noted in this document were discussed and addressed.  No physical exam could be performed with this format.  Please refer to the patient's chart for his  consent to telehealth for [name].   Date:  07/12/2019   ID:  [name], DOB 10-06-1985, MRN [id number]  Patient Location: Home Provider Location: Home  PCP:  Patient, No Pcp Per  Cardiologist:  Lorine Bears, MD  Electrophysiologist:  None   Evaluation Performed:  Follow-Up Visit  Chief Complaint:  Tachycardia  History of Present Illness:    [name] is a 34 y.o. male with type 1 diabetes, HTN, hypothyroidism, mild cognitive delay, retinitis pigmentosa, autonomic neuropathy and tachycardia, peripheral neuropathy.  He was last seen by Dr. Kirke Corin on 12/30/2018.  He was hospitalized in June 2020 at [facility] with left-sided pain.  Found to have mildly elevated troponin at 0.35 which remained flat-underwent CTA of chest which showed no evidence of PE or aortic dissection.  Lexiscan Myoview with no evidence of ischemia with normal EF.  Echo with normal LVEF, normal RV function, no significant valvular abnormalities.  There was no clear cause of his elevated troponin though MI cannot be completely excluded.  Per Dr. Ernest Mallick recommendations he had recurrent symptoms the next up would be cardiac  catheterization.  Seen by Dr. Fransico Michael 06/15/19. He was re-referred to our office for hx of ACS. No noted symptoms, but as he did not have PCP was sent for continued evaluation and management. Vitals from that visit with Bp 140/100, HR 80. Though noted to not have taken his BP meds yet.  Per Dr.Brennan's note - Rondle's sister, [name], who he lives with assists with his care.   [name] has no specific concerns today. His sister was not present during our telephone conversation. History is somewhat difficult.  He reports that he "might have "had an episode of chest pain but cannot recall.  He was instructed to let us know if he has recurrent episodes of chest pain.  The majority of our visit was spent discussing cardiac health.   Reviewed his stress test and echocardiogram in depth with him.  Reassurance provided with discharge he did not have blockages and normal heart muscle function.  Tells me he tries to walk for exercise.  Walks along the road near his home.  Tells me he walks once per week for about 10 minutes.  Tells me this has been difficult recently due to weather changes.  Encouraged him to increase his physical activity for heart health.  Tells he tries to watch what he eats but it is "hard".  We discussed a low-sodium diet and focusing on vegetables and lean sources of protein.  Uses a pill organizer.  Reports compliance with his medications.  Unfortunately does not have a blood pressure cuff.  Discussed recommendations to purchase an arm cuff so he can monitor his blood pressure at home.  That he  has a history of tachycardia his heart rate was well controlled at his recent office visit with Dr. Fransico Michael.  He denies palpitations, irregular heartbeats.  Tells me he only notices his heart racing if he tries to run which he does not do very often, only when he is playing with his nephews.  The patient does not have symptoms concerning for COVID-19 infection (fever, chills, cough, or  new shortness of breath).    Past Medical History:  Diagnosis Date  . Angiopathy, diabetic (HCC)   . Diabetes mellitus    Type 1, diagnosed age 68, with frequent DKA admissions, difficult to control due to MR  . Diabetic nephropathy (HCC)    Started on Lisinopril 5 mg  . Diabetic peripheral neuropathy (HCC)   . Dysmorphic features   . Goiter   . Hypertension   . Hypoglycemia associated with diabetes (HCC)   . Hypothyroidism   . Impaired cognition    mention of mental retardation  . Mental retardation   . Moderate or severe vision impairment, both eyes, impairment level not further specified   . 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 Thyroiditis, autoimmune    Past Surgical History:  Procedure Laterality Date  . DENTAL SURGERY       No outpatient medications have been marked as taking for the 07/12/19 encounter (Appointment) with Alver Sorrow, NP.     Allergies:   Patient has no known allergies.   Social History   Tobacco Use  . Smoking status: Never Smoker  . Smokeless tobacco: Never Used  Substance Use Topics  . Alcohol use: No    Comment: Rarely consumes alcohol, perhaps once or twice per year.  . Drug use: No     Family Hx: The patient's family history includes Diabetes in his cousin, maternal grandmother, and paternal grandmother; Retinitis pigmentosa in his mother; Vision loss in his father.  ROS:   Please see the history of present illness.    Review of Systems  Constitution: Negative for chills, fever and malaise/fatigue.  Cardiovascular: Negative for chest pain, dyspnea on exertion, leg swelling, near-syncope, orthopnea, palpitations and syncope.  Respiratory: Negative for cough, shortness of breath and wheezing.   Gastrointestinal: Negative for nausea and vomiting.  Neurological: Negative for dizziness, light-headedness and weakness.    All other systems reviewed and are negative.   Prior CV studies:   The following studies  were reviewed today:  Echo 11/06/18  1. The left ventricle has normal systolic function with an ejection  fraction of 60-65%. The cavity size was normal. Left ventricular diastolic  parameters were normal.   2. The right ventricle has normal systolic function. The cavity was  normal. There is no increase in right ventricular wall thickness.   Lexiscan Myoview 11/06/18  Blood pressure demonstrated a normal response to exercise.  There was no ST segment deviation noted during stress.  The study is normal.  This is a low risk study.  The left ventricular ejection fraction is normal (55-65%).   Labs/Other Tests and Data Reviewed:    EKG:  No ECG reviewed.  Recent Labs: 06/15/2019: ALT 20; BUN 15; Creat 1.13; Hemoglobin 15.1; Platelets 224; Potassium 4.7; Sodium 143; TSH 1.98   Recent Lipid Panel Lab Results  Component Value Date/Time   CHOL 137 06/15/2019 09:53 AM   TRIG 96 06/15/2019 09:53 AM   HDL 46 06/15/2019 09:53 AM   CHOLHDL 3.0 06/15/2019 09:53 AM   LDLCALC 73  06/15/2019 09:53 AM    Wt Readings from Last 3 Encounters:  06/15/19 146 lb (66.2 kg)  12/25/18 143 lb 8 oz (65.1 kg)  12/14/18 139 lb 9.6 oz (63.3 kg)     Objective:    Vital Signs:  There were no vitals taken for this visit.   VITAL SIGNS:  reviewed  ASSESSMENT & PLAN:    1. Elevated troponin - Noted during admission 10/2018. He had Lexiscan Myoview which was low risk and echocardiogram with normal LVEF and no significant valvular dysfunction. Etiology unclear as workup unremarkable and myocarditis unlikely given lack of infectious symptoms - per Dr. Fletcher Anon previous note MI cannot be completely excluded. He reports "maybe" recurrent chest pain but cannot recall details. No current symptoms concerning for angina - no indication for ischemic evaluation. Long discussion regarding primary prevention of heart disease.  2. HTN - Unable to check BP at home, encouraged to purchase a BP cuff and check regularly. BP  goal <130/80. Continue Lisinopril 10mg  daily. We will have him come to office in a few weeks for BP check. As he has had difficulty with medication management in the past, if need for increased BP control would increase dose of Lisinopril.  3. HLD - 06/15/19 LDL 73. Continue Atorvastatin 10mg  daily.  4. Tachycardia - Noted in medical history. Normal HR at most recent OV with Dr. Tobe Sos. He is only aware of tachycardia if he runs, which he does not do often. We will have him come for office visit for EKG. If additional concern arise regarding tachycardia, consider ZIO monitor. Recommend avoidance of caffeine.   5. DM1/hypothyroidism - Follows with Dr. Tobe Sos of encodrinology.  Of note, Mr. Mineer does not have a primary care provider and would likely benefit from establishing with one.   COVID-19 Education: The signs and symptoms of COVID-19 were discussed with the patient and how to seek care for testing (follow up with PCP or arrange E-visit).  The importance of social distancing was discussed today.  Time:   Today, I have spent 13 minutes with the patient with telehealth technology discussing the above problems.     Medication Adjustments/Labs and Tests Ordered: Current medicines are reviewed at length with the patient today.  Concerns regarding medicines are outlined above.   Tests Ordered: No orders of the defined types were placed in this encounter.   Medication Changes: No orders of the defined types were placed in this encounter.   Follow Up: Follow up In Person in 2-4 week(s) with Dr. Fletcher Anon or APP.  Signed, Loel Dubonnet, NP  07/12/2019 10:55 AM    [place]

## 2019-07-17 ENCOUNTER — Other Ambulatory Visit (INDEPENDENT_AMBULATORY_CARE_PROVIDER_SITE_OTHER): Payer: Self-pay

## 2019-07-17 MED ORDER — PEN NEEDLES 32G X 6 MM MISC: 1.0000 "application " | Freq: Every day | 5 refills | Status: DC | PRN

## 2019-07-31 ENCOUNTER — Ambulatory Visit: Payer: Medicare Other | Admitting: Physician Assistant

## 2019-08-01 ENCOUNTER — Encounter: Payer: Self-pay | Admitting: Physician Assistant

## 2019-08-03 ENCOUNTER — Telehealth (INDEPENDENT_AMBULATORY_CARE_PROVIDER_SITE_OTHER): Payer: Self-pay | Admitting: "Endocrinology

## 2019-08-03 NOTE — Telephone Encounter (Signed)
Who's calling (name and relationship to patient) :Self / Fanny Bien   Best contact number:[phone]  Provider they see:Dr. Fransico Michael Reason for call: pt wants to know if its safe for him to get the COVID vaccines. Please advise pt.   Call ID:      PRESCRIPTION REFILL ONLY  Name of prescription:  Pharmacy:

## 2019-08-06 NOTE — Telephone Encounter (Signed)
Called and left message with call back number

## 2019-08-06 NOTE — Telephone Encounter (Signed)
[name] returned call. He says he will stay near phone for a return call.

## 2019-08-06 NOTE — Telephone Encounter (Signed)
Spoke with [name]. Let him know that there's no harm in getting the vaccine unless you had a serious reaction to the first vaccine or if you're all allergic to what's in it. He is not. I let him know that he could call the number on the [facility] for he number to call and more reading material on it.

## 2019-08-17 ENCOUNTER — Other Ambulatory Visit (INDEPENDENT_AMBULATORY_CARE_PROVIDER_SITE_OTHER): Payer: Self-pay | Admitting: "Endocrinology

## 2019-08-17 DIAGNOSIS — E1065 Type 1 diabetes mellitus with hyperglycemia: Secondary | ICD-10-CM

## 2019-08-25 ENCOUNTER — Ambulatory Visit: Payer: Medicare Other

## 2019-08-30 ENCOUNTER — Ambulatory Visit: Payer: Medicare Other | Attending: Internal Medicine

## 2019-08-30 DIAGNOSIS — Z23 Encounter for immunization: Secondary | ICD-10-CM

## 2019-08-30 NOTE — Progress Notes (Signed)
   Covid-19 Vaccination Clinic  Name:  [name]    MRN: [id number] DOB: 1985-12-16  08/30/2019  [name] was observed post Covid-19 immunization for 15 minutes without incident. He was provided with Vaccine Information Sheet and instruction to access the V-Safe system.   [name] was instructed to call 911 with any severe reactions post vaccine: Marland Kitchen Difficulty breathing  . Swelling of face and throat  . A fast heartbeat  . A bad rash all over body  . Dizziness and weakness   Immunizations Administered    Name Date Dose VIS Date Route   Pfizer COVID-19 Vaccine 08/30/2019 10:44 AM 0.3 mL 05/04/2019 Intramuscular   Manufacturer: [employer], [employer]   Lot: [id number]   NDC: [address]-9

## 2019-09-09 ENCOUNTER — Other Ambulatory Visit: Payer: Self-pay

## 2019-09-09 DIAGNOSIS — Z794 Long term (current) use of insulin: Secondary | ICD-10-CM | POA: Diagnosis not present

## 2019-09-09 DIAGNOSIS — E063 Autoimmune thyroiditis: Secondary | ICD-10-CM | POA: Insufficient documentation

## 2019-09-09 DIAGNOSIS — E1043 Type 1 diabetes mellitus with diabetic autonomic (poly)neuropathy: Secondary | ICD-10-CM | POA: Insufficient documentation

## 2019-09-09 DIAGNOSIS — F79 Unspecified intellectual disabilities: Secondary | ICD-10-CM | POA: Insufficient documentation

## 2019-09-09 DIAGNOSIS — E1065 Type 1 diabetes mellitus with hyperglycemia: Secondary | ICD-10-CM | POA: Diagnosis present

## 2019-09-09 DIAGNOSIS — Z79890 Hormone replacement therapy (postmenopausal): Secondary | ICD-10-CM | POA: Diagnosis not present

## 2019-09-09 DIAGNOSIS — E1021 Type 1 diabetes mellitus with diabetic nephropathy: Secondary | ICD-10-CM | POA: Diagnosis not present

## 2019-09-09 DIAGNOSIS — E1010 Type 1 diabetes mellitus with ketoacidosis without coma: Principal | ICD-10-CM | POA: Insufficient documentation

## 2019-09-09 DIAGNOSIS — I10 Essential (primary) hypertension: Secondary | ICD-10-CM | POA: Diagnosis not present

## 2019-09-09 DIAGNOSIS — Z7982 Long term (current) use of aspirin: Secondary | ICD-10-CM | POA: Insufficient documentation

## 2019-09-09 DIAGNOSIS — Z20822 Contact with and (suspected) exposure to covid-19: Secondary | ICD-10-CM | POA: Diagnosis not present

## 2019-09-09 DIAGNOSIS — N179 Acute kidney failure, unspecified: Secondary | ICD-10-CM | POA: Diagnosis not present

## 2019-09-09 DIAGNOSIS — Z79899 Other long term (current) drug therapy: Secondary | ICD-10-CM | POA: Insufficient documentation

## 2019-09-10 ENCOUNTER — Observation Stay
Admission: EM | Admit: 2019-09-10 | Discharge: 2019-09-11 | Disposition: A | Discharge status: Home / Self Care | Payer: Medicare Other | Attending: Hospitalist | Admitting: Hospitalist

## 2019-09-10 ENCOUNTER — Inpatient Hospital Stay: Payer: Medicare Other

## 2019-09-10 ENCOUNTER — Encounter: Payer: Self-pay | Admitting: *Deleted

## 2019-09-10 ENCOUNTER — Other Ambulatory Visit: Payer: Self-pay

## 2019-09-10 DIAGNOSIS — N179 Acute kidney failure, unspecified: Secondary | ICD-10-CM

## 2019-09-10 DIAGNOSIS — E063 Autoimmune thyroiditis: Secondary | ICD-10-CM | POA: Diagnosis present

## 2019-09-10 DIAGNOSIS — R609 Edema, unspecified: Secondary | ICD-10-CM

## 2019-09-10 DIAGNOSIS — I10 Essential (primary) hypertension: Secondary | ICD-10-CM | POA: Diagnosis present

## 2019-09-10 DIAGNOSIS — F79 Unspecified intellectual disabilities: Secondary | ICD-10-CM

## 2019-09-10 DIAGNOSIS — E1010 Type 1 diabetes mellitus with ketoacidosis without coma: Secondary | ICD-10-CM | POA: Diagnosis present

## 2019-09-10 DIAGNOSIS — E1110 Type 2 diabetes mellitus with ketoacidosis without coma: Secondary | ICD-10-CM | POA: Diagnosis present

## 2019-09-10 LAB — BLOOD GAS, VENOUS
Acid-base deficit: 4.8 mmol/L — ABNORMAL HIGH (ref 0.0–2.0)
Bicarbonate: 20.5 mmol/L (ref 20.0–28.0)
O2 Saturation: 83.2 %
Patient temperature: 37
pCO2, Ven: 38 mmHg — ABNORMAL LOW (ref 44.0–60.0)
pH, Ven: 7.34 (ref 7.250–7.430)
pO2, Ven: 51 mmHg — ABNORMAL HIGH (ref 32.0–45.0)

## 2019-09-10 LAB — BASIC METABOLIC PANEL
Anion gap: 19 — ABNORMAL HIGH (ref 5–15)
Anion gap: 11 (ref 5–15)
BUN: 37 mg/dL — ABNORMAL HIGH (ref 6–20)
BUN: 33 mg/dL — ABNORMAL HIGH (ref 6–20)
CO2: 19 mmol/L — ABNORMAL LOW (ref 22–32)
CO2: 21 mmol/L — ABNORMAL LOW (ref 22–32)
Calcium: 9.4 mg/dL (ref 8.9–10.3)
Calcium: 8.3 mg/dL — ABNORMAL LOW (ref 8.9–10.3)
Chloride: 95 mmol/L — ABNORMAL LOW (ref 98–111)
Chloride: 104 mmol/L (ref 98–111)
Creatinine, Ser: 2.12 mg/dL — ABNORMAL HIGH (ref 0.61–1.24)
Creatinine, Ser: 1.59 mg/dL — ABNORMAL HIGH (ref 0.61–1.24)
GFR calc Af Amer: 47 mL/min — ABNORMAL LOW (ref >60)
GFR calc Af Amer: >60 mL/min (ref >60)
GFR calc non Af Amer: 40 mL/min — ABNORMAL LOW (ref >60)
GFR calc non Af Amer: 57 mL/min — ABNORMAL LOW (ref >60)
Glucose, Bld: 573 mg/dL (ref 70–99)
Glucose, Bld: 344 mg/dL — ABNORMAL HIGH (ref 70–99)
Potassium: 4.6 mmol/L (ref 3.5–5.1)
Potassium: 5.1 mmol/L (ref 3.5–5.1)
Sodium: 133 mmol/L — ABNORMAL LOW (ref 135–145)
Sodium: 136 mmol/L (ref 135–145)

## 2019-09-10 LAB — GLUCOSE, CAPILLARY
Glucose-Capillary: 109 mg/dL — ABNORMAL HIGH (ref 70–99)
Glucose-Capillary: 116 mg/dL — ABNORMAL HIGH (ref 70–99)
Glucose-Capillary: 121 mg/dL — ABNORMAL HIGH (ref 70–99)
Glucose-Capillary: 121 mg/dL — ABNORMAL HIGH (ref 70–99)
Glucose-Capillary: 138 mg/dL — ABNORMAL HIGH (ref 70–99)
Glucose-Capillary: 175 mg/dL — ABNORMAL HIGH (ref 70–99)
Glucose-Capillary: 193 mg/dL — ABNORMAL HIGH (ref 70–99)
Glucose-Capillary: 234 mg/dL — ABNORMAL HIGH (ref 70–99)
Glucose-Capillary: 263 mg/dL — ABNORMAL HIGH (ref 70–99)
Glucose-Capillary: 332 mg/dL — ABNORMAL HIGH (ref 70–99)
Glucose-Capillary: 493 mg/dL — ABNORMAL HIGH (ref 70–99)
Glucose-Capillary: 559 mg/dL (ref 70–99)
Glucose-Capillary: 76 mg/dL (ref 70–99)
Glucose-Capillary: 77 mg/dL (ref 70–99)

## 2019-09-10 LAB — RESPIRATORY PANEL BY RT PCR (FLU A&B, COVID)
Influenza A by PCR: NEGATIVE
Influenza B by PCR: NEGATIVE
SARS Coronavirus 2 by RT PCR: NEGATIVE

## 2019-09-10 LAB — CBC
HCT: 39.8 % (ref 39.0–52.0)
Hemoglobin: 14.1 g/dL (ref 13.0–17.0)
MCH: 31 pg (ref 26.0–34.0)
MCHC: 35.4 g/dL (ref 30.0–36.0)
MCV: 87.5 fL (ref 80.0–100.0)
Platelets: 305 10*3/uL (ref 150–400)
RBC: 4.55 MIL/uL (ref 4.22–5.81)
RDW: 11.9 % (ref 11.5–15.5)
WBC: 13.6 10*3/uL — ABNORMAL HIGH (ref 4.0–10.5)
nRBC: 0 % (ref 0.0–0.2)

## 2019-09-10 LAB — BETA-HYDROXYBUTYRIC ACID: Beta-Hydroxybutyric Acid: 5.99 mmol/L — ABNORMAL HIGH (ref 0.05–0.27)

## 2019-09-10 LAB — URINALYSIS, COMPLETE (UACMP) WITH MICROSCOPIC
Bacteria, UA: NONE SEEN
Bilirubin Urine: NEGATIVE
Glucose, UA: >=500 mg/dL — AB
Ketones, ur: 80 mg/dL — AB
Leukocytes,Ua: NEGATIVE
Nitrite: NEGATIVE
Protein, ur: 30 mg/dL — AB
Specific Gravity, Urine: 1.022 (ref 1.005–1.030)
Squamous Epithelial / HPF: NONE SEEN (ref 0–5)
pH: 5 (ref 5.0–8.0)

## 2019-09-10 LAB — HEPATIC FUNCTION PANEL
ALT: 23 U/L (ref 0–44)
AST: 19 U/L (ref 15–41)
Albumin: 3.8 g/dL (ref 3.5–5.0)
Alkaline Phosphatase: 81 U/L (ref 38–126)
Bilirubin, Direct: 0.2 mg/dL (ref 0.0–0.2)
Indirect Bilirubin: 2 mg/dL — ABNORMAL HIGH (ref 0.3–0.9)
Total Bilirubin: 2.2 mg/dL — ABNORMAL HIGH (ref 0.3–1.2)
Total Protein: 7 g/dL (ref 6.5–8.1)

## 2019-09-10 LAB — HEMOGLOBIN A1C
Hgb A1c MFr Bld: 9.3 % — ABNORMAL HIGH (ref 4.8–5.6)
Mean Plasma Glucose: 220.21 mg/dL

## 2019-09-10 LAB — LIPASE, BLOOD: Lipase: 19 U/L (ref 11–51)

## 2019-09-10 MED ORDER — SODIUM CHLORIDE 0.9 % IV SOLN: INTRAVENOUS | Status: DC

## 2019-09-10 MED ORDER — DEXTROSE-NACL 5-0.45 % IV SOLN: INTRAVENOUS | Status: DC

## 2019-09-10 MED ORDER — LEVOTHYROXINE SODIUM 25 MCG PO TABS
25.0000 ug | ORAL_TABLET | Freq: Every day | ORAL | Status: DC
  Administered 2019-09-10 – 2019-09-11 (×2): 25 ug via ORAL
  Filled 2019-09-10 (×3): qty 1

## 2019-09-10 MED ORDER — ACETAMINOPHEN 325 MG PO TABS
650.0000 mg | ORAL_TABLET | Freq: Four times a day (QID) | ORAL | Status: DC | PRN
  Administered 2019-09-10: 650 mg via ORAL
  Filled 2019-09-10: qty 2

## 2019-09-10 MED ORDER — POTASSIUM CHLORIDE 10 MEQ/100ML IV SOLN
10.0000 meq | INTRAVENOUS | Status: AC
  Administered 2019-09-10 (×2): 10 meq via INTRAVENOUS
  Filled 2019-09-10 (×2): qty 100

## 2019-09-10 MED ORDER — POTASSIUM CHLORIDE 10 MEQ/100ML IV SOLN: 10.0000 meq | INTRAVENOUS | Status: DC

## 2019-09-10 MED ORDER — ASPIRIN EC 81 MG PO TBEC
81.0000 mg | DELAYED_RELEASE_TABLET | Freq: Every day | ORAL | Status: DC
  Administered 2019-09-10 – 2019-09-11 (×2): 81 mg via ORAL
  Filled 2019-09-10 (×2): qty 1

## 2019-09-10 MED ORDER — INSULIN REGULAR(HUMAN) IN NACL 100-0.9 UT/100ML-% IV SOLN: INTRAVENOUS | Status: DC

## 2019-09-10 MED ORDER — DEXTROSE 50 % IV SOLN: 0.0000 mL | INTRAVENOUS | Status: DC | PRN

## 2019-09-10 MED ORDER — INSULIN GLARGINE 100 UNIT/ML ~~LOC~~ SOLN
30.0000 [IU] | Freq: Every day | SUBCUTANEOUS | Status: DC
  Administered 2019-09-10: 30 [IU] via SUBCUTANEOUS
  Filled 2019-09-10 (×3): qty 0.3

## 2019-09-10 MED ORDER — INSULIN ASPART 100 UNIT/ML ~~LOC~~ SOLN: 0.0000 [IU] | Freq: Three times a day (TID) | SUBCUTANEOUS | Status: DC

## 2019-09-10 MED ORDER — INSULIN ASPART 100 UNIT/ML ~~LOC~~ SOLN
4.0000 [IU] | Freq: Three times a day (TID) | SUBCUTANEOUS | Status: DC
  Administered 2019-09-11: 4 [IU] via SUBCUTANEOUS
  Filled 2019-09-10: qty 1

## 2019-09-10 MED ORDER — ONDANSETRON HCL 4 MG/2ML IJ SOLN
4.0000 mg | Freq: Once | INTRAMUSCULAR | Status: AC
  Administered 2019-09-10: 4 mg via INTRAVENOUS
  Filled 2019-09-10: qty 2

## 2019-09-10 MED ORDER — LISINOPRIL 10 MG PO TABS
10.0000 mg | ORAL_TABLET | Freq: Every day | ORAL | Status: DC
  Administered 2019-09-10 – 2019-09-11 (×2): 10 mg via ORAL
  Filled 2019-09-10 (×2): qty 1

## 2019-09-10 MED ORDER — LACTATED RINGERS IV BOLUS
1000.0000 mL | Freq: Once | INTRAVENOUS | Status: AC
  Administered 2019-09-10: 1000 mL via INTRAVENOUS

## 2019-09-10 MED ORDER — ENOXAPARIN SODIUM 40 MG/0.4ML ~~LOC~~ SOLN
40.0000 mg | SUBCUTANEOUS | Status: DC
  Administered 2019-09-10 – 2019-09-11 (×2): 40 mg via SUBCUTANEOUS
  Filled 2019-09-10 (×2): qty 0.4

## 2019-09-10 MED ORDER — ATORVASTATIN CALCIUM 20 MG PO TABS
10.0000 mg | ORAL_TABLET | Freq: Every day | ORAL | Status: DC
  Administered 2019-09-10 – 2019-09-11 (×2): 10 mg via ORAL
  Filled 2019-09-10 (×2): qty 1

## 2019-09-10 MED ORDER — INSULIN REGULAR(HUMAN) IN NACL 100-0.9 UT/100ML-% IV SOLN
INTRAVENOUS | Status: DC
  Administered 2019-09-10: 7 [IU]/h via INTRAVENOUS
  Filled 2019-09-10: qty 100

## 2019-09-10 NOTE — Progress Notes (Signed)
Inpatient Diabetes Program Recommendations  AACE/ADA: New Consensus Statement on Inpatient Glycemic Control (2015)  Target Ranges:  Prepandial:   less than 140 mg/dL      Peak postprandial:   less than 180 mg/dL (1-2 hours)      Critically ill patients:  140 - 180 mg/dL   Results for MAURIO, BAIZE (MRN [id number]) as of 09/10/2019 07:10  Ref. Range 09/10/2019 00:00 09/10/2019 00:01 09/10/2019 01:57 09/10/2019 03:02 09/10/2019 04:13 09/10/2019 05:12 09/10/2019 06:27  Glucose-Capillary Latest Ref Range: 70 - 99 mg/dL >662 (HH) 947 (HH) 654 (H)  IV Insulin Drip Started 332 (H)  IV Insulin Drip 263 (H)  IV Insulin Drip 234 (H)  IV Insulin Drip 193 (H)  IV Insulin Drip   Results for KAMALI, NEPHEW (MRN [id number]) as of 09/10/2019 07:10  Ref. Range 09/10/2019 01:17  Beta-Hydroxybutyric Acid Latest Ref Range: 0.05 - 0.27 mmol/L 5.99 (H)   Results for BRITTIN, JANIK (MRN [id number]) as of 09/10/2019 07:10  Ref. Range 12/14/2018 10:23 06/15/2019 09:03 06/15/2019 09:03 09/10/2019 00:14  Hemoglobin A1C Latest Ref Range: 4.8 - 5.6 % 9.7 (A) Pend 9.0 (A) 9.3 (H)  (220 mg/dl)    Admit with: DKA  History: Type 1 Diabetes (diagnosed age 34), MR  Home DM Meds: Humalog 75/25 Insulin 28 units AM/ 24 units PM  Current Orders: IV Insulin Drip   Endocrinologist: Dr. Fransico Michael with [facility] [place]--last seen 06/15/2019--Per MD notes, pt lives with his sister [name] who helps supervise his care--Was told to continue taking his Humalog 75/25 insulin as listed above.  Per H&P note: "Patient was visiting his grandmother where he had been for the past 5 days and had not taken his insulin"  BMET from 2:55am shows the following:  Glucose 344 mg/dl Anion Gap= 11 CO2 level= 21   Note Next Beta-Hydroxybutyric Acid level pending.  Needs BMET this AM.  Please leave on the IV Insulin Drip until CBGs stable.  When patient ready to transition to SQ Insulin, please  consider the following:  1. Start Lantus 30 units Daily--Make sure pt receives 1st dose at least 1 hour prior to d/c of the IV Insulin Drip Patient gets about 39 units longer acting insulin in his 2 doses of Humalog 75/25 insulin at home--  2. Start Novolog Sensitive Correction Scale/ SSI (0-9 units) TID AC + HS  3. Start Novolog 4 units TID with meals for Meal Coverage once allowed to eat solid diet  4. Can have pt resume his home Humalog 75/25 Insulin at time of d/c home     --Will follow patient during hospitalization--  Ambrose Finland RN, MSN, CDE Diabetes Coordinator Inpatient Glycemic Control Team Team Pager: [phone] (8a-5p)

## 2019-09-10 NOTE — ED Notes (Addendum)
This RN to give Lantus once received from pharm. Pt given food tray by NT.

## 2019-09-10 NOTE — ED Notes (Addendum)
CBG 193, Noel RN notified.

## 2019-09-10 NOTE — ED Notes (Signed)
Pt's L arm noted to be swollen and tight from hand up midway into upper arm. Pt denies pain but states arm feels "tight". Skin somewhat taut. Arm warm but slightly different temp than skin around site. IV access at L fa and L ac. Gtts currently paused awaiting provider Lai's reply. EDP Siadecki notified in person. States he will assess pt's arm soon.

## 2019-09-10 NOTE — Progress Notes (Signed)
PROGRESS NOTE    [name]  PJK:[id number] DOB: Mar 31, 1986 DOA: 09/10/2019 PCP: Patient, No Pcp Per    Assessment & Plan:   Principal Problem:   DKA (diabetic ketoacidoses) ([place]) Active Problems:   Essential hypertension, benign   Intellectual disability   Hypothyroidism, acquired, autoimmune   DKA, type 1 ([place])    [name] is a 34 y.o. male with medical history significant for diabetes type 1, hypothyroidism, hypertension who is intellectually disabled who was brought into the emergency room with nausea and vomiting.  Patient was visiting his grandmother where he had been for the past 5 days and had not taken his insulin.   DKA type I without coma (diabetic ketoacidoses) ([place])  --due to missing insulin for 5 days. --s/p insulin gtt and IVF  --Gap closed this morning PLAN: --transitioned to subQ insulin; Lantus 30u daily and mealtime 4u TID, and SSI    Essential hypertension, benign -Continue home Lisinopril    Intellectual disability -Increase nursing care as needed    Hypothyroidism, acquired, autoimmune -Continue levothyroxine   DVT prophylaxis: Lovenox SQ Code Status: Full code  Family Communication:  Status is: changed to inpatient today Dispo:   The patient is from: home Anticipated d/c is to: home Anticipated d/c date is: tomorrow Patient currently is not medically stable to d/c due to: pt needs to be back on stable subQ insulin regimen with oral intake.   Subjective and Interval History:  Pt reported feeling much better today.  No more N/V.  No abdominal pain.  Able to have oral intake now.  No fever, dyspnea, chest pain, diarrhea, dysuria.  Noted left arm swelling and pain this morning.   Objective: Vitals:   09/10/19 1700 09/10/19 1730 09/10/19 1800 09/10/19 1830  BP: 127/84 122/81 126/80 130/88  Pulse: 87 89 81 87  Resp: 12 15 16 20   Temp:      TempSrc:      SpO2: 98% 99% 100% 98%  Weight:      Height:         Intake/Output Summary (Last 24 hours) at 09/10/2019 1941 Last data filed at 09/10/2019 0410 Gross per 24 hour  Intake 2253.79 ml  Output 500 ml  Net 1753.79 ml   Filed Weights   09/10/19 0009  Weight: 68 kg    Examination:   Constitutional: NAD, AAOx3 HEENT: conjunctivae and lids normal, EOMI CV: RRR no M,R,G. Distal pulses +2.  No cyanosis.   RESP: CTA B/L, normal respiratory effort  GI: +BS, NTND Extremities: No effusions, edema, or tenderness in BLE MSK: normal ROM and strength, no joint enlargement or tenderness of both UE and LE SKIN: warm, dry and intact Neuro: II - XII grossly intact.  Sensation intact Psych: Normal mood and affect.  Appropriate judgement and reason   Data Reviewed: I have personally reviewed following labs and imaging studies  CBC: Recent Labs  Lab 09/10/19 0014  WBC 13.6*  HGB 14.1  HCT 39.8  MCV 87.5  PLT 809   Basic Metabolic Panel: Recent Labs  Lab 09/10/19 0014 09/10/19 0255  NA 133* 136  K 4.6 5.1  CL 95* 104  CO2 19* 21*  GLUCOSE 573* 344*  BUN 37* 33*  CREATININE 2.12* 1.59*  CALCIUM 9.4 8.3*   GFR: Estimated Creatinine Clearance: 56.4 mL/min (A) (by C-G formula based on SCr of 1.59 mg/dL (H)). Liver Function Tests: Recent Labs  Lab 09/10/19 0014  AST 19  ALT 23  ALKPHOS 81  BILITOT 2.2*  PROT 7.0  ALBUMIN 3.8   Recent Labs  Lab 09/10/19 0014  LIPASE 19   No results for input(s): AMMONIA in the last 168 hours. Coagulation Profile: No results for input(s): INR, PROTIME in the last 168 hours. Cardiac Enzymes: No results for input(s): CKTOTAL, CKMB, CKMBINDEX, TROPONINI in the last 168 hours. BNP (last 3 results) No results for input(s): PROBNP in the last 8760 hours. HbA1C: Recent Labs    09/10/19 0014  HGBA1C 9.3*   CBG: Recent Labs  Lab 09/10/19 0937 09/10/19 1126 09/10/19 1313 09/10/19 1430 09/10/19 1659  GLUCAP 121* 109* 76 77 116*   Lipid Profile: No results for input(s): CHOL, HDL,  LDLCALC, TRIG, CHOLHDL, LDLDIRECT in the last 72 hours. Thyroid Function Tests: No results for input(s): TSH, T4TOTAL, FREET4, T3FREE, THYROIDAB in the last 72 hours. Anemia Panel: No results for input(s): VITAMINB12, FOLATE, FERRITIN, TIBC, IRON, RETICCTPCT in the last 72 hours. Sepsis Labs: No results for input(s): PROCALCITON, LATICACIDVEN in the last 168 hours.  Recent Results (from the past 240 hour(s))  Respiratory Panel by RT PCR (Flu A&B, Covid) - Nasopharyngeal Swab     Status: None   Collection Time: 09/10/19  1:17 AM   Specimen: Nasopharyngeal Swab  Result Value Ref Range Status   SARS Coronavirus 2 by RT PCR NEGATIVE NEGATIVE Final    Comment: (NOTE) SARS-CoV-2 target nucleic acids are NOT DETECTED. The SARS-CoV-2 RNA is generally detectable in upper respiratoy specimens during the acute phase of infection. The lowest concentration of SARS-CoV-2 viral copies this assay can detect is 131 copies/mL. A negative result does not preclude SARS-Cov-2 infection and should not be used as the sole basis for treatment or other patient management decisions. A negative result may occur with  improper specimen collection/handling, submission of specimen other than nasopharyngeal swab, presence of viral mutation(s) within the areas targeted by this assay, and inadequate number of viral copies (<131 copies/mL). A negative result must be combined with clinical observations, patient history, and epidemiological information. The expected result is Negative. Fact Sheet for Patients:  https://www.moore.com/ Fact Sheet for Healthcare Providers:  https://www.young.biz/ This test is not yet ap proved or cleared by the [place] FDA and  has been authorized for detection and/or diagnosis of SARS-CoV-2 by FDA under an Emergency Use Authorization (EUA). This EUA will remain  in effect (meaning this test can be used) for the duration of the COVID-19  declaration under Section 564(b)(1) of the Act, 21 U.S.C. section 360bbb-3(b)(1), unless the authorization is terminated or revoked sooner.    Influenza A by PCR NEGATIVE NEGATIVE Final   Influenza B by PCR NEGATIVE NEGATIVE Final    Comment: (NOTE) The Xpert Xpress SARS-CoV-2/FLU/RSV assay is intended as an aid in  the diagnosis of influenza from Nasopharyngeal swab specimens and  should not be used as a sole basis for treatment. Nasal washings and  aspirates are unacceptable for Xpert Xpress SARS-CoV-2/FLU/RSV  testing. Fact Sheet for Patients: https://www.moore.com/ Fact Sheet for Healthcare Providers: https://www.young.biz/ This test is not yet approved or cleared by the [place] FDA and  has been authorized for detection and/or diagnosis of SARS-CoV-2 by  FDA under an Emergency Use Authorization (EUA). This EUA will remain  in effect (meaning this test can be used) for the duration of the  Covid-19 declaration under Section 564(b)(1) of the Act, 21  U.S.C. section 360bbb-3(b)(1), unless the authorization is  terminated or revoked. Performed at [facility], [address] [place],  [place] [address]       Radiology Studies: US Venous Img Upper Uni Left (DVT)  Result Date: 09/10/2019 CLINICAL DATA:  Left upper extremity swelling EXAM: LEFT UPPER EXTREMITY VENOUS DOPPLER ULTRASOUND TECHNIQUE: Gray-scale sonography with graded compression, as well as color Doppler and duplex ultrasound were performed to evaluate the upper extremity deep venous system from the level of the subclavian vein and including the jugular, axillary, basilic, radial, ulnar and upper cephalic vein. Spectral Doppler was utilized to evaluate flow at rest and with distal augmentation maneuvers. COMPARISON:  None. FINDINGS: Contralateral Subclavian Vein: Respiratory phasicity is normal and symmetric with the symptomatic side. No evidence of thrombus. Normal  compressibility. Internal Jugular Vein: No evidence of thrombus. Normal compressibility, respiratory phasicity and response to augmentation. Subclavian Vein: No evidence of thrombus. Normal compressibility, respiratory phasicity and response to augmentation. Axillary Vein: No evidence of thrombus. Normal compressibility, respiratory phasicity and response to augmentation. Cephalic Vein: No evidence of thrombus. Normal compressibility, respiratory phasicity and response to augmentation. Basilic Vein: No evidence of thrombus. Normal compressibility, respiratory phasicity and response to augmentation. Brachial Veins: No evidence of thrombus. Normal compressibility, respiratory phasicity and response to augmentation. Radial Veins: No evidence of thrombus. Normal compressibility, respiratory phasicity and response to augmentation. Ulnar Veins: No evidence of thrombus. Normal compressibility, respiratory phasicity and response to augmentation. Venous Reflux:  None visualized. Other Findings:  None visualized. IMPRESSION: No evidence of DVT within the left upper extremity. Electronically Signed   By: Duanne Guess D.O.   On: 09/10/2019 17:01     Scheduled Meds: . aspirin EC  81 mg Oral Daily  . atorvastatin  10 mg Oral Daily  . enoxaparin (LOVENOX) injection  40 mg Subcutaneous Q24H  . insulin aspart  0-9 Units Subcutaneous TID WC  . insulin aspart  4 Units Subcutaneous TID WC  . insulin glargine  30 Units Subcutaneous Daily  . levothyroxine  25 mcg Oral QAC breakfast  . lisinopril  10 mg Oral Daily   Continuous Infusions: . sodium chloride    . sodium chloride Stopped (09/10/19 1136)  . dextrose 5 % and 0.45% NaCl 75 mL/hr at 09/10/19 1420  . dextrose 5 % and 0.45% NaCl Stopped (09/10/19 1136)     LOS: 0 days     Darlin Priestly, MD [facility] If 7PM-7AM, please contact night-coverage 09/10/2019, 7:41 PM

## 2019-09-10 NOTE — ED Notes (Signed)
Pt sleeping. 

## 2019-09-10 NOTE — ED Notes (Signed)
fsbs 116   Pt alert.  Iv infusing.

## 2019-09-10 NOTE — ED Notes (Signed)
Pt alert  Iv infusing.  Pt waiting on admission.

## 2019-09-10 NOTE — ED Notes (Signed)
Family at bedside.  Mother, cell number in chart, at bedside but leaving to go home soon, EDP spoke to mother

## 2019-09-10 NOTE — ED Triage Notes (Signed)
Pt presents w/ CBG @ home that was high on the meter. Pt was at grandparents x 5 days and forgot insulin. Pt has taken no insulin from Thursday until tonight. Mother administered [place] insulin prior to arrival. Pt presents w/ c/o n/v x 6 episodes today. Pt is somewhat lethargic and slow to answer questions.

## 2019-09-10 NOTE — Progress Notes (Signed)
Admit with: DKA  History: Type 1 Diabetes (diagnosed age 34), MR  Home DM Meds: Humalog 75/25 Insulin 28 units AM/ 24 units PM  Current Orders: IV Insulin Drip      Lantus 30 units Daily      Novolog Sensitive Correction Scale/ SSI (0-9 units) TID AC       Novolog 4 units TID   Endocrinologist: Dr. Tobe Sos with [facility] [place] seen 06/15/2019--Per MD notes, pt lives with his sister Nira Conn who helps supervise his care--Was told to continue taking his Humalog 75/25 insulin as listed above.  Per H&P note: "Patient was visiting his grandmother where he had been for the past 5 days and had not taken his insulin"  Note transition to SQ Insulin has begun this AM.    Met w/ pt and his Mom down in the ED.  Pt's Mom told me pt went to stay with his Royann Shivers and Grandpa on Thursday afternoon.  Took his insulin Thursday AM but forgot to take the Insulin with him.  Mom told me pt didn't want to bother his sister and ask her to bring the insulin to him so he went without insulin for 3 days prior to arrival to the ED.  Mom says she knows pt is supposed to take the Insulin BID.  Stated pt didn't want to be a bother to his sister since pt's grandparents live far from his sister.  Gently reminded Mom and pt of the utmost importance of taking insulin BID at instructed by his ENDO.  Reminded pt that since he has Type 1 diabetes, he cannot be without insulin and that's it's OK to call his family is he goes to his grandparents house and asks his family to bring him his insulin.   Mom stated she would provide an update to pt's sister who is pt's primary caretaker.     --Will follow patient during hospitalization--  Wyn Quaker RN, MSN, CDE Diabetes Coordinator Inpatient Glycemic Control Team Team Pager: [phone] (8a-5p)

## 2019-09-10 NOTE — H&P (Signed)
History and Physical    OZ GAMMEL XTG:[id number] DOB: 20-Feb-1986 DOA: 09/10/2019  PCP: Patient, No Pcp Per   Patient coming from: Home I have personally briefly reviewed patient's old medical records in [facility]  Chief Complaint: Nausea and vomiting  HPI: CORDARRYL MONRREAL is a 34 y.o. male with medical history significant for diabetes type 1, hypothyroidism, hypertension who is intellectually disabled who was brought into the emergency room with nausea and vomiting.  Emesis was nonbloody nonbilious and consisted of gastric contents.  Vomited up to 4 times on the day of admission.  Denies abdominal pain, fever or chills and denies change in bowel habits.  No reports of cough chest pain or shortness of breath.  Patient was visiting his grandmother where he had been for the past 5 days and had not taken his insulin.  ED Course: On arrival to the emergency room he was tachycardic at 122 tachypneic at 24 low-grade temperature 99.2.  White cell count 13,600, creatinine 2.12 above baseline of 1.19.  Blood sugar 573 with an anion gap of 19.  Lipase and liver enzymes pending.  Patient was started on IV fluid bolus as well as IV insulin.  Hospitalist consulted for admission  Review of Systems: As per HPI otherwise 10 point review of systems negative.    Past Medical History:  Diagnosis Date  . Angiopathy, diabetic (HCC)   . Diabetes mellitus    Type 1, diagnosed age 46, with frequent DKA admissions, difficult to control due to MR  . Diabetic nephropathy (HCC)    Started on Lisinopril 5 mg  . Diabetic peripheral neuropathy ([facility])   . Dysmorphic features   . Goiter   . Hypertension   . Hypoglycemia associated with diabetes ([facility])   . Hypothyroidism   . Impaired cognition    mention of mental retardation  . Mental retardation   . Moderate or severe vision impairment, both eyes, impairment level not further specified   . 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 Thyroiditis, autoimmune     Past Surgical History:  Procedure Laterality Date  . DENTAL SURGERY       reports that he has never smoked. He has never used smokeless tobacco. He reports that he does not drink alcohol or use drugs.  No Known Allergies  Family History  Problem Relation Age of Onset  . Vision loss Father   . Retinitis pigmentosa Mother   . Diabetes Maternal Grandmother        AODM  . Diabetes Paternal Grandmother        AODM  . Diabetes Cousin        Second cousin has juvenile-onset DM.     Prior to Admission medications   Medication Sig Start Date End Date Taking? Authorizing Provider  aspirin EC 81 MG tablet Take 1 tablet (81 mg total) by mouth daily. 12/25/18   Iran Ouch, MD  atorvastatin (LIPITOR) 10 MG tablet TAKE 1 TABLET (10 MG TOTAL) BY MOUTH DAILY. 03/29/16   David Stall, MD  Insulin Lispro Prot & Lispro (HUMALOG MIX 75/25 KWIKPEN) (75-25) 100 UNIT/ML Kwikpen INJECT 28 UNITS OF INSULIN IN THE MORNING AND 24 UNITS IN THE EVENING 04/26/19   David Stall, MD  Insulin Pen Needle (PEN NEEDLES) 32G X 6 MM MISC 1 application by Does not apply route daily as needed. Use to inject insulin 6 times daily. 07/17/19   David Stall, MD  lisinopril (ZESTRIL) 10 MG tablet TAKE 1 TABLET BY MOUTH EVERY DAY 03/05/19   Sherrlyn Hock, MD  [facility] VERIO test strip USE TO CHECK BLOOD SUGAR 3 TIMES DAILY AS DIRECTED 08/17/19   Sherrlyn Hock, MD  SYNTHROID 25 MCG tablet Take 1 tablet (25 mcg total) by mouth daily before breakfast. 05/07/19   Sherrlyn Hock, MD    Physical Exam: Vitals:   09/10/19 0008 09/10/19 0009  BP: (!) 123/98   Pulse: (!) 122   Resp: (!) 24   Temp: 99.2 F (37.3 C)   TempSrc: Oral   SpO2: 100%   Weight:  68 kg  Height:  5\' 4"  (1.626 m)     Vitals:   09/10/19 0008 09/10/19 0009  BP: (!) 123/98   Pulse: (!) 122   Resp: (!) 24   Temp: 99.2 F (37.3 C)   TempSrc: Oral   SpO2: 100%   Weight:  68 kg    Height:  5\' 4"  (1.626 m)    Constitutional: Alert and awake, oriented x3, not in any acute distress. Eyes: PERLA, EOMI, irises appear normal, anicteric sclera,  ENMT: external ears and nose appear normal, normal hearing             Lips appears normal, oropharynx mucosa, tongue, posterior pharynx appear normal  Neck: neck appears normal, no masses, normal ROM, no thyromegaly, no JVD  CVS: S1-S2 clear, no murmur rubs or gallops,  , no carotid bruits, pedal pulses palpable, No LE edema Respiratory:  clear to auscultation bilaterally, no wheezing, rales or rhonchi. Respiratory effort normal. No accessory muscle use.  Abdomen: soft nontender, nondistended, normal bowel sounds, no hepatosplenomegaly, no hernias Musculoskeletal: : no cyanosis, clubbing , no contractures or atrophy Neuro: Cranial nerves II-XII intact, sensation, reflexes normal, strength Psych: judgement and insight appear normal, stable mood and affect,  Skin: no rashes or lesions or ulcers, no induration or nodules   Labs on Admission: I have personally reviewed following labs and imaging studies  CBC: Recent Labs  Lab 09/10/19 0014  WBC 13.6*  HGB 14.1  HCT 39.8  MCV 87.5  PLT 194   Basic Metabolic Panel: Recent Labs  Lab 09/10/19 0014  NA 133*  K 4.6  CL 95*  CO2 19*  GLUCOSE 573*  BUN 37*  CREATININE 2.12*  CALCIUM 9.4   GFR: Estimated Creatinine Clearance: 42.3 mL/min (A) (by C-G formula based on SCr of 2.12 mg/dL (H)). Liver Function Tests: No results for input(s): AST, ALT, ALKPHOS, BILITOT, PROT, ALBUMIN in the last 168 hours. No results for input(s): LIPASE, AMYLASE in the last 168 hours. No results for input(s): AMMONIA in the last 168 hours. Coagulation Profile: No results for input(s): INR, PROTIME in the last 168 hours. Cardiac Enzymes: No results for input(s): CKTOTAL, CKMB, CKMBINDEX, TROPONINI in the last 168 hours. BNP (last 3 results) No results for input(s): PROBNP in the last 8760  hours. HbA1C: No results for input(s): HGBA1C in the last 72 hours. CBG: Recent Labs  Lab 09/10/19 0000 09/10/19 0001  GLUCAP >600* 559*   Lipid Profile: No results for input(s): CHOL, HDL, LDLCALC, TRIG, CHOLHDL, LDLDIRECT in the last 72 hours. Thyroid Function Tests: No results for input(s): TSH, T4TOTAL, FREET4, T3FREE, THYROIDAB in the last 72 hours. Anemia Panel: No results for input(s): VITAMINB12, FOLATE, FERRITIN, TIBC, IRON, RETICCTPCT in the last 72 hours. Urine analysis:    Component Value Date/Time   COLORURINE STRAW (A) 09/10/2019 0115   APPEARANCEUR CLEAR (  A) 09/10/2019 0115   LABSPEC 1.022 09/10/2019 0115   PHURINE 5.0 09/10/2019 0115   GLUCOSEU >=500 (A) 09/10/2019 0115   HGBUR SMALL (A) 09/10/2019 0115   BILIRUBINUR NEGATIVE 09/10/2019 0115   KETONESUR 80 (A) 09/10/2019 0115   PROTEINUR 30 (A) 09/10/2019 0115   UROBILINOGEN 1.0 05/12/2014 1159   NITRITE NEGATIVE 09/10/2019 0115   LEUKOCYTESUR NEGATIVE 09/10/2019 0115    Radiological Exams on Admission: No results found.  EKG: Independently reviewed.   Assessment/Plan Principal Problem:   DKA type I without coma (diabetic ketoacidoses) (HCC) -Continue IV fluids -Continue IV insulin per Endo tool -Triggered by not using insulin for 5 days.  No acute intra-abdominal pathology or acute infection suspected at this time -Transition to subcutaneous insulin when gap closes and symptomatically improved  Active Problems:   Essential hypertension, benign -Continue home antihypertensives with small sips of water once tolerating -   Intellectual disability -Increase nursing care as needed    Hypothyroidism, acquired, autoimmune -Continue levothyroxine    DVT prophylaxis: Lovenox  Code Status: full code  Family Communication:  none  Disposition Plan: Back to previous home environment Consults called: none  Status:obs    Andris Baumann MD [facility]     09/10/2019, 1:52 AM

## 2019-09-10 NOTE — ED Notes (Signed)
Provider Fran Lowes notified of loss of IV access d/t infiltration. Needed another overlap of insulin gtt and fluids with Lantus. Provider made aware. Awaiting orders.

## 2019-09-10 NOTE — ED Notes (Signed)
Pt's L arm elevated on two pillows. Bag of ice placed over arm. Educated and requested pt leave ice in place for at a time. Pt agreeable. Pt's mother remains at bedside.

## 2019-09-10 NOTE — ED Notes (Signed)
U/s in with pt now.  

## 2019-09-10 NOTE — ED Notes (Signed)
Provider Fran Lowes states will come to bedside soon to assess pt's arm.

## 2019-09-10 NOTE — ED Notes (Signed)
Pt given snack. 
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
## 2019-09-10 NOTE — ED Notes (Signed)
Pt given meal tray.

## 2019-09-10 NOTE — ED Notes (Signed)
EDP Siadecki to bedside to assess L arm.

## 2019-09-11 ENCOUNTER — Encounter (INDEPENDENT_AMBULATORY_CARE_PROVIDER_SITE_OTHER): Payer: Self-pay

## 2019-09-11 DIAGNOSIS — E1010 Type 1 diabetes mellitus with ketoacidosis without coma: Secondary | ICD-10-CM | POA: Diagnosis not present

## 2019-09-11 LAB — BASIC METABOLIC PANEL
Anion gap: 5 (ref 5–15)
BUN: 20 mg/dL (ref 6–20)
CO2: 28 mmol/L (ref 22–32)
Calcium: 8.8 mg/dL — ABNORMAL LOW (ref 8.9–10.3)
Chloride: 109 mmol/L (ref 98–111)
Creatinine, Ser: 1 mg/dL (ref 0.61–1.24)
GFR calc Af Amer: >60 mL/min (ref >60)
GFR calc non Af Amer: >60 mL/min (ref >60)
Glucose, Bld: 74 mg/dL (ref 70–99)
Potassium: 4.2 mmol/L (ref 3.5–5.1)
Sodium: 142 mmol/L (ref 135–145)

## 2019-09-11 LAB — CBC
HCT: 37.7 % — ABNORMAL LOW (ref 39.0–52.0)
Hemoglobin: 12.8 g/dL — ABNORMAL LOW (ref 13.0–17.0)
MCH: 31.1 pg (ref 26.0–34.0)
MCHC: 34 g/dL (ref 30.0–36.0)
MCV: 91.5 fL (ref 80.0–100.0)
Platelets: 227 10*3/uL (ref 150–400)
RBC: 4.12 MIL/uL — ABNORMAL LOW (ref 4.22–5.81)
RDW: 12.2 % (ref 11.5–15.5)
WBC: 8.8 10*3/uL (ref 4.0–10.5)
nRBC: 0 % (ref 0.0–0.2)

## 2019-09-11 LAB — MAGNESIUM: Magnesium: 2.3 mg/dL (ref 1.7–2.4)

## 2019-09-11 LAB — GLUCOSE, CAPILLARY: Glucose-Capillary: 99 mg/dL (ref 70–99)

## 2019-09-11 MED ORDER — INSULIN DETEMIR 100 UNIT/ML ~~LOC~~ SOLN
28.0000 [IU] | Freq: Two times a day (BID) | SUBCUTANEOUS | Status: DC
  Administered 2019-09-11: 28 [IU] via SUBCUTANEOUS
  Filled 2019-09-11 (×2): qty 0.28

## 2019-09-11 MED ORDER — INSULIN GLARGINE 100 UNIT/ML ~~LOC~~ SOLN
20.0000 [IU] | Freq: Every day | SUBCUTANEOUS | Status: DC
  Filled 2019-09-11: qty 0.2

## 2019-09-11 NOTE — Care Management CC44 (Signed)
Condition Code 44 Documentation Completed  Patient Details  Name: CHRISTOPHR CALIX MRN: [id number] Date of Birth: June 01, 1985   Condition Code 44 given:  Yes Patient signature on Condition Code 44 notice:  Yes Documentation of 2 MD's agreement:  Yes Code 44 added to claim:  Yes    Allayne Butcher, RN 09/11/2019, 11:31 AM

## 2019-09-11 NOTE — Care Management Obs Status (Signed)
MEDICARE OBSERVATION STATUS NOTIFICATION   Patient Details  Name: ARAMIS WEIL MRN: [id number] Date of Birth: [birth year]   Medicare Observation Status Notification Given:  Yes    Allayne Butcher, RN 09/11/2019, 11:31 AM

## 2019-09-11 NOTE — Discharge Summary (Signed)
Physician Discharge Summary   [name]  male DOB: 08-Sep-1985  YQM:[id number]  PCP: Patient, No Pcp Per  Admit date: 09/10/2019 Discharge date: 09/11/2019  Admitted From: home Disposition:  Home Mother updated about discharge plans prior to discharge. CODE STATUS: Full code     Hospital Course:  For full details, please see H&P, progress notes, consult notes and ancillary notes.  Briefly,  [name] Savoy Sutphinis a 33 y.o.malewith medical history significant fordiabetes type 1, hypothyroidism, hypertension, intellectually disabled who was brought into the emergency room with nausea and vomiting. Patient was visiting his grandmother where he had been for the past 5 days and had not taken his insulin.   DKA type I without coma(diabetic ketoacidoses) (HCC)  Blood sugar 573 with an anion gap of 19.  Beta-Hydroxybutyric Acid 5.99.  Pt received insulin gtt and IVF per protocol.  Gap closed next morning.  Pt was transitioned to subQ insulin and tolerating diet prior to discharge.  Pt was discharged on his home insulin regimen.  Mother was at bedside prior to discharge and confirmed pt will have access to insulin after discharge.  Essential hypertension, benign Continued home Lisinopril  Hypothyroidism, acquired, autoimmune Continued levothyroxine   Discharge Diagnoses:  Principal Problem:   DKA (diabetic ketoacidoses) (HCC) Active Problems:   Essential hypertension, benign   Intellectual disability   Hypothyroidism, acquired, autoimmune   DKA, type 1 (HCC)    Discharge Instructions:  Allergies as of 09/11/2019   No Known Allergies     Medication List    TAKE these medications   aspirin EC 81 MG tablet Take 1 tablet (81 mg total) by mouth daily.   atorvastatin 10 MG tablet Commonly known as: LIPITOR TAKE 1 TABLET (10 MG TOTAL) BY MOUTH DAILY. What changed: See the new instructions.   Insulin Lispro Prot & Lispro (75-25) 100 UNIT/ML  Kwikpen Commonly known as: HumaLOG Mix 75/25 KwikPen INJECT 28 UNITS OF INSULIN IN THE MORNING AND 24 UNITS IN THE EVENING   lisinopril 10 MG tablet Commonly known as: ZESTRIL TAKE 1 TABLET BY MOUTH EVERY DAY   OneTouch Verio test strip Generic drug: glucose blood USE TO CHECK BLOOD SUGAR 3 TIMES DAILY AS DIRECTED   Pen Needles 32G X 6 MM Misc 1 application by Does not apply route daily as needed. Use to inject insulin 6 times daily.   Synthroid 25 MCG tablet Generic drug: levothyroxine Take 1 tablet (25 mcg total) by mouth daily before breakfast.       Follow-up Information    Iran Ouch, MD. Schedule an appointment as soon as possible for a visit in 1 week(s).   Specialty: Cardiology Contact information: [address] [place] [address] [phone]           No Known Allergies   The results of significant diagnostics from this hospitalization (including imaging, microbiology, ancillary and laboratory) are listed below for reference.   Consultations:   Procedures/Studies: US Venous Img Upper Uni Left (DVT)  Result Date: 09/10/2019 CLINICAL DATA:  Left upper extremity swelling EXAM: LEFT UPPER EXTREMITY VENOUS DOPPLER ULTRASOUND TECHNIQUE: Gray-scale sonography with graded compression, as well as color Doppler and duplex ultrasound were performed to evaluate the upper extremity deep venous system from the level of the subclavian vein and including the jugular, axillary, basilic, radial, ulnar and upper cephalic vein. Spectral Doppler was utilized to evaluate flow at rest and with distal augmentation maneuvers. COMPARISON:  None. FINDINGS: Contralateral Subclavian Vein: Respiratory  phasicity is normal and symmetric with the symptomatic side. No evidence of thrombus. Normal compressibility. Internal Jugular Vein: No evidence of thrombus. Normal compressibility, respiratory phasicity and response to augmentation. Subclavian Vein: No evidence of  thrombus. Normal compressibility, respiratory phasicity and response to augmentation. Axillary Vein: No evidence of thrombus. Normal compressibility, respiratory phasicity and response to augmentation. Cephalic Vein: No evidence of thrombus. Normal compressibility, respiratory phasicity and response to augmentation. Basilic Vein: No evidence of thrombus. Normal compressibility, respiratory phasicity and response to augmentation. Brachial Veins: No evidence of thrombus. Normal compressibility, respiratory phasicity and response to augmentation. Radial Veins: No evidence of thrombus. Normal compressibility, respiratory phasicity and response to augmentation. Ulnar Veins: No evidence of thrombus. Normal compressibility, respiratory phasicity and response to augmentation. Venous Reflux:  None visualized. Other Findings:  None visualized. IMPRESSION: No evidence of DVT within the left upper extremity. Electronically Signed   By: Davina Poke D.O.   On: 09/10/2019 17:01      Labs: BNP (last 3 results) No results for input(s): BNP in the last 8760 hours. Basic Metabolic Panel: Recent Labs  Lab 09/10/19 0014 09/10/19 0255 09/11/19 0428  NA 133* 136 142  K 4.6 5.1 4.2  CL 95* 104 109  CO2 19* 21* 28  GLUCOSE 573* 344* 74  BUN 37* 33* 20  CREATININE 2.12* 1.59* 1.00  CALCIUM 9.4 8.3* 8.8*  MG  --   --  2.3   Liver Function Tests: Recent Labs  Lab 09/10/19 0014  AST 19  ALT 23  ALKPHOS 81  BILITOT 2.2*  PROT 7.0  ALBUMIN 3.8   Recent Labs  Lab 09/10/19 0014  LIPASE 19   No results for input(s): AMMONIA in the last 168 hours. CBC: Recent Labs  Lab 09/10/19 0014 09/11/19 0428  WBC 13.6* 8.8  HGB 14.1 12.8*  HCT 39.8 37.7*  MCV 87.5 91.5  PLT 305 227   Cardiac Enzymes: No results for input(s): CKTOTAL, CKMB, CKMBINDEX, TROPONINI in the last 168 hours. BNP: Invalid input(s): POCBNP CBG: Recent Labs  Lab 09/10/19 1313 09/10/19 1430 09/10/19 1659 09/10/19 2229  09/11/19 0806  GLUCAP 76 77 116* 121* 99   D-Dimer No results for input(s): DDIMER in the last 72 hours. Hgb A1c Recent Labs    09/10/19 0014  HGBA1C 9.3*   Lipid Profile No results for input(s): CHOL, HDL, LDLCALC, TRIG, CHOLHDL, LDLDIRECT in the last 72 hours. Thyroid function studies No results for input(s): TSH, T4TOTAL, T3FREE, THYROIDAB in the last 72 hours.  Invalid input(s): FREET3 Anemia work up No results for input(s): VITAMINB12, FOLATE, FERRITIN, TIBC, IRON, RETICCTPCT in the last 72 hours. Urinalysis    Component Value Date/Time   COLORURINE STRAW (A) 09/10/2019 0115   APPEARANCEUR CLEAR (A) 09/10/2019 0115   LABSPEC 1.022 09/10/2019 0115   PHURINE 5.0 09/10/2019 0115   GLUCOSEU >=500 (A) 09/10/2019 0115   HGBUR SMALL (A) 09/10/2019 0115   BILIRUBINUR NEGATIVE 09/10/2019 0115   KETONESUR 80 (A) 09/10/2019 0115   PROTEINUR 30 (A) 09/10/2019 0115   UROBILINOGEN 1.0 05/12/2014 1159   NITRITE NEGATIVE 09/10/2019 0115   LEUKOCYTESUR NEGATIVE 09/10/2019 0115   Sepsis Labs Invalid input(s): PROCALCITONIN,  WBC,  LACTICIDVEN Microbiology Recent Results (from the past 240 hour(s))  Respiratory Panel by RT PCR (Flu A&B, Covid) - Nasopharyngeal Swab     Status: None   Collection Time: 09/10/19  1:17 AM   Specimen: Nasopharyngeal Swab  Result Value Ref Range Status   SARS Coronavirus 2 by RT  PCR NEGATIVE NEGATIVE Final    Comment: (NOTE) SARS-CoV-2 target nucleic acids are NOT DETECTED. The SARS-CoV-2 RNA is generally detectable in upper respiratoy specimens during the acute phase of infection. The lowest concentration of SARS-CoV-2 viral copies this assay can detect is 131 copies/mL. A negative result does not preclude SARS-Cov-2 infection and should not be used as the sole basis for treatment or other patient management decisions. A negative result may occur with  improper specimen collection/handling, submission of specimen other than nasopharyngeal swab,  presence of viral mutation(s) within the areas targeted by this assay, and inadequate number of viral copies (<131 copies/mL). A negative result must be combined with clinical observations, patient history, and epidemiological information. The expected result is Negative. Fact Sheet for Patients:  https://www.moore.com/ Fact Sheet for Healthcare Providers:  https://www.young.biz/ This test is not yet ap proved or cleared by the [place] FDA and  has been authorized for detection and/or diagnosis of SARS-CoV-2 by FDA under an Emergency Use Authorization (EUA). This EUA will remain  in effect (meaning this test can be used) for the duration of the COVID-19 declaration under Section 564(b)(1) of the Act, 21 U.S.C. section 360bbb-3(b)(1), unless the authorization is terminated or revoked sooner.    Influenza A by PCR NEGATIVE NEGATIVE Final   Influenza B by PCR NEGATIVE NEGATIVE Final    Comment: (NOTE) The Xpert Xpress SARS-CoV-2/FLU/RSV assay is intended as an aid in  the diagnosis of influenza from Nasopharyngeal swab specimens and  should not be used as a sole basis for treatment. Nasal washings and  aspirates are unacceptable for Xpert Xpress SARS-CoV-2/FLU/RSV  testing. Fact Sheet for Patients: https://www.moore.com/ Fact Sheet for Healthcare Providers: https://www.young.biz/ This test is not yet approved or cleared by the [place] FDA and  has been authorized for detection and/or diagnosis of SARS-CoV-2 by  FDA under an Emergency Use Authorization (EUA). This EUA will remain  in effect (meaning this test can be used) for the duration of the  Covid-19 declaration under Section 564(b)(1) of the Act, 21  U.S.C. section 360bbb-3(b)(1), unless the authorization is  terminated or revoked. Performed at [facility], [address] [place], [place] [address]      Total time spend on  discharging this patient, including the last patient exam, discussing the hospital stay, instructions for ongoing care as it relates to all pertinent caregivers, as well as preparing the medical discharge records, prescriptions, and/or referrals as applicable, is 40 minutes.    Darlin Priestly, MD  [facility] 09/11/2019, 10:25 AM  If 7PM-7AM, please contact night-coverage

## 2019-09-12 LAB — GLUCOSE, CAPILLARY: Glucose-Capillary: >600 mg/dL (ref 70–99)

## 2019-09-12 NOTE — Progress Notes (Signed)
Cardiology Office Note    Date:  09/18/2019   ID:  [name], DOB [birth year], MRN [id number]  PCP:  Patient, No Pcp Per  Cardiologist:  Lorine Bears, MD  Electrophysiologist:  None   Chief Complaint: Hospital follow-up  History of Present Illness:   [name] is a 34 y.o. male with history of type 1 diabetes, retinitis pigmentosa, mild cognitive delay, autonomic and peripheral neuropathy, HTN, hypothyroidism, and tachypalpitations who was recently admitted to the hospital 09/10/2019 through 09/11/2019 for DKA.  He was previously admitted in 10/2018 with left-sided chest pain.  He was found to have a mildly elevated troponin of 0.35 which remained flat and nontrending.  CTA of the chest showed no evidence of PE or aortic dissection along with no evidence of significant coronary artery calcification.  Echo showed an EF of 60 to 65%, normal LV diastolic function, normal RV systolic function and cavity size, trivial mitral regurgitation.  Lexiscan MPI showed no significant ischemia with an EF of 55 to 65% and was overall a low risk study.  No clear cause for his elevated troponin was identified with recommendation for diagnostic cath in the setting of recurrent symptoms.  He was last seen virtually in 06/2019 and was doing well from a cardiac perspective with recommendation for continued primary prevention.  There was mention of consideration of Zio patch for previously noted mildly tachycardic heart rates on chart biopsy.  More recently, he was admitted to [facility] from 4/19 through 4/20 with DKA without coma in the setting of missing insulin for 5 days.  No active cardiac issue was noted during this admission.  Cardiac enzymes were not cycled.  EKG was not obtained.  Thoracic imaging was not undertaken.  He was treated with insulin drip and continued on PTA medications.  He comes in today accompanied by his mother.  He is doing very well from a cardiac perspective.  He has not  had any further episodes of chest pain dating back to his hospital admission in 10/2018 as outlined above.  No dyspnea, palpitations, dizziness, presyncope, syncope, lower extremity swelling, abdominal distention, orthopnea, PND, or early satiety.  No falls.  He is not checking his blood pressure at home though does take his medications without issues.  He will be participating in [facility] later this summer.  He does not have any issues or concerns to discuss at this time.   Labs independently reviewed: 08/2019 - magnesium 2.3, Hgb 12.8, PLT 227, potassium 4.2, BUN 20, serum creatinine 1.0, albumin 3.8, AST/ALT normal, A1c 9.3 05/2019 - TSH normal TC 137, TG 96, HDL 46, LDL 73  Past Medical History:  Diagnosis Date   Angiopathy, diabetic (HCC)    Diabetes mellitus    Type 1, diagnosed age 66, with frequent DKA admissions, difficult to control due to MR   Diabetic nephropathy (HCC)    Started on Lisinopril 5 mg   Diabetic peripheral neuropathy (HCC)    Dysmorphic features    Goiter    Hypertension    Hypoglycemia associated with diabetes (HCC)    Hypothyroidism    Impaired cognition    mention of mental retardation   Mental retardation    Moderate or severe vision impairment, both eyes, impairment level not further specified    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   Thyroiditis, autoimmune     Past Surgical History:  Procedure Laterality Date   DENTAL SURGERY  Current Medications: Current Meds  Medication Sig   aspirin EC 81 MG tablet Take 1 tablet (81 mg total) by mouth daily.   atorvastatin (LIPITOR) 10 MG tablet TAKE 1 TABLET (10 MG TOTAL) BY MOUTH DAILY.   Insulin Lispro Prot & Lispro (HUMALOG MIX 75/25 KWIKPEN) (75-25) 100 UNIT/ML Kwikpen INJECT 28 UNITS OF INSULIN IN THE MORNING AND 24 UNITS IN THE EVENING   Insulin Pen Needle (PEN NEEDLES) 32G X 6 MM MISC 1 application by Does not apply route daily as needed. Use to inject insulin  6 times daily.   lisinopril (ZESTRIL) 10 MG tablet TAKE 1 TABLET BY MOUTH EVERY DAY   ONETOUCH VERIO test strip USE TO CHECK BLOOD SUGAR 3 TIMES DAILY AS DIRECTED   SYNTHROID 25 MCG tablet Take 1 tablet (25 mcg total) by mouth daily before breakfast.    Allergies:   Patient has no known allergies.   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   Drug use: No   Sexual activity: Never  Other Topics Concern   Not on file  Social History Narrative   Lives in [place] with parents. Patient has 2 siblings a brother with a cardiac condition and a sister who is healthy.   Patient is legally blind and thus cannot drive.    Notable for developmental delay and illiteracy. 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   Social Determinants of Health   Financial Resource Strain:    Difficulty of Paying Living Expenses:   Food Insecurity:    Worried About Programme researcher, broadcasting/film/video in the Last Year:    Barista in the Last Year:   Transportation Needs:    Freight forwarder (Medical):    Lack of Transportation (Non-Medical):   Physical Activity:    Days of Exercise per Week:    Minutes of Exercise per Session:   Stress:    Feeling of Stress :   Social Connections:    Frequency of Communication with Friends and Family:    Frequency of Social Gatherings with Friends and Family:    Attends Religious Services:    Active Member of Clubs or Organizations:    Attends Engineer, structural:    Marital Status:       Family History:  The patient's family history includes Diabetes in his cousin, maternal grandmother, and paternal grandmother; Retinitis pigmentosa in his mother; Vision loss in his father.  ROS:   Review of Systems  Constitutional: Negative for chills, diaphoresis, fever, malaise/fatigue and weight loss.  HENT: Negative for congestion.   Eyes: Negative for discharge and redness.  Respiratory: Negative for cough, sputum production, shortness of breath and wheezing.   Cardiovascular: Negative for chest pain, palpitations, orthopnea, claudication, leg swelling and PND.  Gastrointestinal: Negative for abdominal pain, heartburn, nausea and vomiting.  Musculoskeletal: Negative for falls and myalgias.  Skin: Negative for rash.  Neurological: Negative for dizziness, tingling, tremors, sensory change, speech change, focal  weakness, loss of consciousness and weakness.  Endo/Heme/Allergies: Does not bruise/bleed easily.  Psychiatric/Behavioral: Negative for substance abuse. The patient is not nervous/anxious.   All other systems reviewed and are negative.    EKGs/Labs/Other Studies Reviewed:    Studies reviewed were summarized above. The additional studies were reviewed today:  2D echo 10/2018: 1. The left ventricle has normal systolic function with an ejection  fraction of 60-65%. The cavity size was normal. Left ventricular diastolic  parameters were normal.  2. The right ventricle has normal systolic function. The cavity was  normal. There is no increase in right ventricular wall thickness.  __________  Eugenie Birks MPI 10/2018:  Blood pressure demonstrated a normal response to exercise.  There was no ST segment deviation noted during stress.  The study is normal.  This is a low risk study.  The left ventricular ejection fraction is normal (55-65%).   EKG:  EKG is ordered today.  The EKG ordered today demonstrates NSR, 80 bpm, no acute ST-T changes  Recent Labs: 06/15/2019: TSH  1.98 09/10/2019: ALT 23 09/11/2019: BUN 20; Creatinine, Ser 1.00; Hemoglobin 12.8; Magnesium 2.3; Platelets 227; Potassium 4.2; Sodium 142  Recent Lipid Panel    Component Value Date/Time   CHOL 137 06/15/2019 0953   TRIG 96 06/15/2019 0953   HDL 46 06/15/2019 0953   CHOLHDL 3.0 06/15/2019 0953   VLDL 16 11/06/2018 0111   LDLCALC 73 06/15/2019 0953    PHYSICAL EXAM:    VS:  BP 140/90 (BP Location: Left Arm, Patient Position: Sitting, Cuff Size: Normal)    Pulse 80    Ht 5\' 4"  (1.626 m)    Wt 146 lb 2 oz (66.3 kg)    SpO2 99%    BMI 25.08 kg/m   BMI: Body mass index is 25.08 kg/m.  Physical Exam  Constitutional: He is oriented to person, place, and time. He appears well-developed and well-nourished.  HENT:  Head: Normocephalic and atraumatic.  Eyes: Right eye exhibits no discharge. Left eye exhibits no discharge.  Neck: No JVD present.  Cardiovascular: Normal rate, regular rhythm, S1 normal, S2 normal and normal heart sounds. Exam reveals no distant heart sounds, no friction rub, no midsystolic click and no opening snap.  No murmur heard. Pulses:      Posterior tibial pulses are 2+ on the right side and 2+ on the left side.  Pulmonary/Chest: Effort normal and breath sounds normal. No respiratory distress. He has no decreased breath sounds. He has no wheezes. He has no rales. He exhibits no tenderness.  Abdominal: Soft. He exhibits no distension. There is no abdominal tenderness.  Musculoskeletal:        General: No edema.     Cervical back: Normal range of motion.  Neurological: He is alert and oriented to person, place, and time.  Skin: Skin is warm and dry. No cyanosis. Nails show no clubbing.  Psychiatric: He has a normal mood and affect. His speech is normal and behavior is normal. Judgment and thought content normal.    Wt Readings from Last 3 Encounters:  09/18/19 146 lb 2 oz (66.3 kg)  09/13/19 142 lb 6.4 oz (64.6 kg)  09/10/19 150 lb (68 kg)     ASSESSMENT & PLAN:     1. Elevated troponin: He was previously admitted to the hospital with left-sided chest pain and noted to have a mildly elevated and flat trending troponin with reassuring noninvasive work-up including echo and Lexiscan MPI as outlined above.  The exact etiology  of his mildly elevated troponin was uncertain with myocarditis being less likely given lack of infectious symptoms.  MI could not be completely excluded, particularly involving a small territory vessel given his normal noninvasive evaluation.  He continues to do very well from a cardiac perspective and has not had any symptoms concerning for angina dating back to his hospital admission in 09/2018.  He does not have any functional status limitations from a cardiac perspective.  Given he remains asymptomatic from a cardiac perspective we will continue to defer diagnostic LHC at this time.  Should he redevelop symptoms this can be revisited.  Letter was provided for the patient to participate in [facility] activities.  2. HTN: Blood pressure was mildly elevated at triage 140/90.  Recheck BP 122/84.  Continue lisinopril.  3. HLD: LDL of 73 from 05/2019.  Remains on atorvastatin 10 mg daily.   Disposition: F/u with Dr. Fletcher Anon or an APP in 6 months, sooner if needed.   Medication Adjustments/Labs and Tests Ordered: Current medicines are reviewed at length with the patient today.  Concerns regarding medicines are outlined above. Medication changes, Labs and Tests ordered today are summarized above and listed in the Patient Instructions accessible in Encounters.   Signed, Christell Faith, PA-C 09/18/2019 1:21 PM     [address] [place], [place] [address] [phone]

## 2019-09-13 ENCOUNTER — Ambulatory Visit (INDEPENDENT_AMBULATORY_CARE_PROVIDER_SITE_OTHER): Payer: Medicare Other | Admitting: "Endocrinology

## 2019-09-13 ENCOUNTER — Other Ambulatory Visit: Payer: Self-pay

## 2019-09-13 ENCOUNTER — Encounter (INDEPENDENT_AMBULATORY_CARE_PROVIDER_SITE_OTHER): Payer: Self-pay | Admitting: "Endocrinology

## 2019-09-13 VITALS — BP 128/80 | HR 80 | Wt 142.4 lb

## 2019-09-13 DIAGNOSIS — E10649 Type 1 diabetes mellitus with hypoglycemia without coma: Secondary | ICD-10-CM

## 2019-09-13 DIAGNOSIS — I249 Acute ischemic heart disease, unspecified: Secondary | ICD-10-CM | POA: Diagnosis not present

## 2019-09-13 DIAGNOSIS — E1065 Type 1 diabetes mellitus with hyperglycemia: Secondary | ICD-10-CM

## 2019-09-13 DIAGNOSIS — E063 Autoimmune thyroiditis: Secondary | ICD-10-CM

## 2019-09-13 DIAGNOSIS — E049 Nontoxic goiter, unspecified: Secondary | ICD-10-CM | POA: Diagnosis not present

## 2019-09-13 DIAGNOSIS — E1151 Type 2 diabetes mellitus with diabetic peripheral angiopathy without gangrene: Secondary | ICD-10-CM

## 2019-09-13 DIAGNOSIS — E860 Dehydration: Secondary | ICD-10-CM

## 2019-09-13 DIAGNOSIS — Z91199 Patient's noncompliance with other medical treatment and regimen due to unspecified reason: Secondary | ICD-10-CM

## 2019-09-13 DIAGNOSIS — E1142 Type 2 diabetes mellitus with diabetic polyneuropathy: Secondary | ICD-10-CM

## 2019-09-13 DIAGNOSIS — Z9119 Patient's noncompliance with other medical treatment and regimen: Secondary | ICD-10-CM

## 2019-09-13 LAB — POCT URINALYSIS DIPSTICK
Glucose, UA: POSITIVE — AB
Ketones, UA: NEGATIVE

## 2019-09-13 LAB — POCT GLUCOSE (DEVICE FOR HOME USE): POC Glucose: 406 mg/dl — AB (ref 70–99)

## 2019-09-13 NOTE — Patient Instructions (Signed)
Follow up visit in 3 months. Please bring in  [place] in one month for download.

## 2019-09-13 NOTE — Progress Notes (Signed)
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  HISTORY OF PRESENT ILLNESS:   [name] is a 34 y.o. Caucasian young man.  Tajon was accompanied by his mother.      1. I first consulted on [name] when he was admitted to the Pediatric Ward at the [facility] in May 2007.  A. The patient was diagnosed with T1DM at age 33 in about 2001 or 2002. We have few details about his medical care until 2007. We know that in about 2006 he saw a pediatric endocrinologist at [facility], [facility], [facility]. Unfortunately, the family chose not to return to see the endocrinologist. In about April of 2007, his primary care provider, Dr. Reed Breech of [facility], started the patient on an insulin pump. Unfortunately, neither the patient nor the family were capable of utilizing the pump's technology effectively.   B. On 10/02/2005 the patient was admitted to the [facility] for poorly controlled type 1 diabetes. I consulted on the patient at that time. 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 He did not have the insight or intelligence to perform the daily tasks of diabetes self-care independently or to utilize the insulin pump. It also appeared that there was not a great deal of adult supervision and support within his family. We tried to teach him and his family how to follow a multiple daily injection of insulin plan with Lantus and Novolog insulin, but that attempt failed rapidly. I changed his regimen to  Novolog Mix 70/30 insulin taken twice daily. He was to take 26 units each morning at breakfast and 14 units each evening at supper. I also gave him a sliding scale for Humalog lispro with different doses of insulin to be used at breakfast, lunch, supper, and bedtime in addition to his 70/30 insulin when his family members were available to check the blood sugars and give the additional insulin. The patient's family members agreed to supervise his care.  2. Unfortunately, [name] has frequently not had the adult supervision and support that he needed. In the last 12 years, the patient has been scheduled to return to our clinic for follow up visits approximately every 3 months, but has actually only returned to our clinic for follow up visits 1-4 times per year, although the family has done much better in the past three years. In 2012 he had 3 visits and was admitted once. In 2013 he had one admission in December, followed by a clinic visit later that month. In 2014 he had 4 clinic visits. In 2015 he had two clinic visits. In fairness, however, I should note that I was unavailable to see him for two months in 2015 due to my severe leg injury and post-operative recuperation. He had 4 visits in 2016 and 4 visits in 2017, but in 2018 he only had 3 visits. Today is his fourth visit in 2019. Since moving in with his sister and her family in January 2019, the adult supervision and support in [place]' life have substantially improved.  3. During the past 14 years [name] has had multiple medical problems:  A. T1DM: His hemoglobin A1c values have  been mostly in the 9.0-14.0% range. He has the following complications of diabetes: diabetic angiopathy, peripheral neuropathy, autonomic neuropathy, inappropriate sinus tachycardia, gastroparesis and postprandial abdominal bloating, chronic constipation, urinary microalbuminuria, and occasional but significant hypoglycemia. Due to health insurance restrictions he has taken Novolog  70/30 Mix insulin at some times and Humalog 75/25 Mix insulin at other times.   B. Autoimmune hypothyroidism: In November 2007, the patient was diagnosed with hypothyroidism secondary to Hashimoto's disease and was started on Synthroid at a dose of 25 mcg per day.   C. Hypertension: In November 2008, he was diagnosed with hypertension and started on lisinopril, 5 mg per day.   D. Hypercholesterolemia: In July 2014 he was diagnosed with hypercholesterolemia and started on atorvastatin, 10 mg/day.  E. Noncompliance/medical neglect/inadequate medical supervision: Unfortunately, [name] has frequently missed BG checks, doses of insulin, and doses of his oral medications. Parental/family supervision and support have varied in consistency and in quality throughout these years. However, since [name] has been living with [name] and her family, the supervision and support have been much better.  [name] had an episode of chest pain and pallor on 11/05/18. He was admitted to [facility] where he was diagnosed with acute coronary syndrome. That episode scared him and caused him to try to be somewhat more compliant with his DM care plan.   4. [name]' last [facility] clinic visit was on 06/15/2019.  At that visit we continued his Humalog Mix 75/25 insulin doses of 28 units in the mornings and 24 units in the evenings.    A. In the interim he had been healthy until 09/09/19 when he presented to the ED at [facility] with hyperglycemia, DKA, and dehydration. His CBG was 559. His serum glucose was 573, CO2 19, anion gap 19, BHOB 5.9% (ref 0.05-00.27), HbA1c 9.3%, urine glucose >500, urine ketones 80. He was treated with iv insulin per [facility]. He was discharged on 09/11/19. In retrospect, he had been visiting his grandmother in [place] for 3 days and had forgotten to bring his insulin with him. When he arrived home on 09/09/19 his mother recognized that he was sick and had severe ketonuria, so she gave him 29 units of 70/30 insulin and then  immediately took him to the ED.   B. BG control has still been "up and down". His morning BGs are usually pre-prandial. Later BGs have been mostly pre-prandial. Since his last visit he had not had many low BGs, but has been low 1-2 times since then.    C. He has not really been trying to eat better. He has had some physical activity.   D. He continues on his morning 75/25 insulin dose of 28 units and his evening dose of 24 units.  He says he is not missing many insulin doses, but his printout suggests otherwise.    E. His dentures are working well.   [name]' sister, [name], now supervises his care in her home most of the time.     G. He now has both Medicaid and Medicare coverage, however,  [place] Medicaid will no longer pay for medications. He receives his insulins and DM supplies through [facility].   H.  His skin lesions have healed up. He is not doing much itching and scratching.   I. He is supposed to be taking 25 mcg of Synthroid daily, 10 mg of lisinopril daily, and 10 mg of atorvastatin daily. He says that [name] is supervising him. He says he is being "more responsible  about taking my medicines".    5. Pertinent Review of Systems:  Constitutional: The patient feels "good overall", but still a little bit weak. However, when he becomes hypoglycemic or extremely hyperglycemic, he can still be very irritable and sometimes even combative.  Eyes: Vision is about the same. He is legally blind. He had his annual eye exam in October or November 2019. He was told that his diabetes is not interfering with his eyes.  Neck: The patient has no complaints of anterior neck swelling, soreness, tenderness, pressure, discomfort, or difficulty swallowing.  Heart: The patient has no other complaints of palpitations, irregular heat beats, chest pain, or chest pressure. Gastrointestinal: He does not usually have post-prandial bloating. He is no longer constipated. The patient has no other complaints of  excessive hunger, acid reflux, stomach aches or pains, diarrhea, or constipation. Legs: He no longer has pains in the right upper thigh. Muscle mass and strength seem normal. There are no other complaints of numbness, tingling, burning, or pain. No edema is noted. Feet: He says he no longer picks at his toe nails. There are no recent complaints of numbness, tingling, burning, or pain. No edema is noted. GU: He rarely has nocturia. Hypoglycemia: He has not had many low BGs in the past month.    .    6. BG meter review: No data   7 FreeStyle Libre download: His average daily SGs have varied from 100 to 276 when he is at home. Some days he takes his insulin twice daily, but sometimes only once. His SGs when he was at his grandmother's home were all >400. He has had several SGS <70, but he can't identify a pattern.   PAST MEDICAL, FAMILY, AND SOCIAL HISTORY:  Past Medical History:  Diagnosis Date  . Angiopathy, diabetic (HCC)   . Diabetes mellitus    Type 1, diagnosed age 82, with frequent DKA admissions, difficult to control due to MR  . Diabetic nephropathy (HCC)    Started on Lisinopril 5 mg  . Diabetic peripheral neuropathy (HCC)   . Dysmorphic features   . Goiter   . Hypertension   . Hypoglycemia associated with diabetes (HCC)   . Hypothyroidism   . Impaired cognition    mention of mental retardation  . Mental retardation   . Moderate or severe vision impairment, both eyes, impairment level not further specified   . 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 Thyroiditis, autoimmune     Family History  Problem Relation Age of Onset  . Vision loss Father   . Retinitis pigmentosa Mother   . Diabetes Maternal Grandmother        AODM  . Diabetes Paternal Grandmother        AODM  . Diabetes Cousin        Second cousin has juvenile-onset DM.     Current Outpatient Medications:  .  aspirin EC 81 MG tablet, Take 1 tablet (81 mg total) by mouth daily., Disp: 90  tablet, Rfl: 3 .  Insulin Lispro Prot & Lispro (HUMALOG MIX 75/25 KWIKPEN) (75-25) 100 UNIT/ML Kwikpen, INJECT 28 UNITS OF INSULIN IN THE MORNING AND 24 UNITS IN THE EVENING, Disp: 15 mL, Rfl: 5 .  lisinopril (ZESTRIL) 10 MG tablet, TAKE 1 TABLET BY MOUTH EVERY DAY, Disp: 90 tablet, Rfl: 1 .  SYNTHROID 25 MCG tablet, Take 1 tablet (25 mcg total) by mouth daily before breakfast., Disp: 90 tablet, Rfl: 1 .  atorvastatin (  LIPITOR) 10 MG tablet, TAKE 1 TABLET (10 MG TOTAL) BY MOUTH DAILY. (Patient not taking: No sig reported), Disp: 90 tablet, Rfl: 0 .  Insulin Pen Needle (PEN NEEDLES) 32G X 6 MM MISC, 1 application by Does not apply route daily as needed. Use to inject insulin 6 times daily. (Patient not taking: Reported on 09/13/2019), Disp: 200 each, Rfl: 5 .  ONETOUCH VERIO test strip, USE TO CHECK BLOOD SUGAR 3 TIMES DAILY AS DIRECTED (Patient not taking: Reported on 09/13/2019), Disp: 300 strip, Rfl: 1  Allergies as of 09/13/2019  . (No Known Allergies)    1. Work and Family: [name] remains unemployed. He is still living with his sister, [name], and her family in [place]. [name] stays at home and so can look after [name]. [name]' friend, Gerlene Burdock, also usually lives in the house, but is now helping to take care of [name]' grandfather in the grandfather's home. When they are together, [name] and Gerlene Burdock help each other out and exercise together. Mom has gone back to work. Mom is also trying to care for her mother and father, so she is pulled in many different directions.  2. Activities: [name] has not been very active.  3. Smoking, alcohol, or drugs: No tobacco or alcohol or drugs.  4. Primary Care Provider: None at present  REVIEW OF SYSTEMS: There are no other significant problems involving Zahi's other body systems.   Objective:  Vital Signs:  BP 128/80   Pulse 80   Wt 142 lb 6.4 oz (64.6 kg)   BMI 24.44 kg/m    Ht Readings from Last 3 Encounters:  09/10/19 5\' 4"  (1.626 m)   07/12/19 5\' 4"  (1.626 m)  06/15/19 5' 4.53" (1.639 m)   Wt Readings from Last 3 Encounters:  09/13/19 142 lb 6.4 oz (64.6 kg)  09/10/19 150 lb (68 kg)  07/12/19 150 lb (68 kg)   Body surface area is 1.71 meters squared.  Facility age limit for growth percentiles is 20 years. Facility age limit for growth percentiles is 20 years.  PHYSICAL EXAM:  Constitutional: The patient appears healthy, but his gait is still somewhat wide-based and shuffling due to his visual handicap. His weight has decreased by 8 pounds. He was fairly engaged and interactive at his level today. He still does not really remember much about checking his BGs and taking his insulins. His thought processes are still very concrete. His insight remains poor. Although he really does not understand or comprehend much about diabetes, about why he needs to take care of himself, and about what will happen to him if he does not take care of himself, this recent episode of DKA really scared him.   Face: The face is somewhat dysmorphic. His face is very similar to mom's face and [name]'s face. Eyes: There is no obvious arcus or proptosis. His eyes are slightly dry.  Mouth: The oropharynx appears normal. His geographic tongue has resolved. He no longer has any maxillary teeth, but he is wearing his new dentures. His mouth is fairly dry.    Neck: The neck appears to be visibly normal. No carotid bruits are noted. The thyroid gland has shrunk back to normal at about 20 grams today. The consistency of the thyroid gland is normal today. The thyroid gland is not tender to palpation. Lungs: The lungs are clear to auscultation. Air movement is good. Heart: Heart rate is rapid, but rhythm is regular. Heart sounds S1 and S2 are normal. I did not appreciate  any pathologic cardiac murmurs. Abdomen: The abdomen is normal in size, but he has more adipose tissue today. Bowel sounds are normal. There is no obvious hepatomegaly, splenomegaly, or other  mass effect.  Arms: Muscle size and bulk are normal for age. He has no new areas of excoriation on his arms. The older areas have healed.  Hands: There is no obvious tremor. Phalangeal and metacarpophalangeal joints are normal. Palmar muscles are normal. Palmar skin is normal. Palmar moisture is also normal. Nail beds are pallid. Legs: Muscles appear normal for age. No edema is present. Feet: Feet are normally formed. DP and PT pulses are trace. His left great toenail has healed from his prior stubbing injury.  Neurologic: Strength is normal for age in both the upper and lower extremities. Muscle tone is normal. Sensation to touch is normal in both the legs and feet.    LAB DATA:   Labs 09/13/19: CBG 406; urine glucose positive, urine ketones negative; He apparently did not take his AM insulin until later this afternoon.   Labs 09/10/19: HbA1c 9.3%  Labs 06/15/19: HbA1c 9.0%, CBG 196; TSH 1.98, free T4 1.2, free T3 3.4; CMP normal; CBC normal, cholesterol 137, triglycerides 96, HDL 46, LDL 73; urinary microalbumin/creatinine ratio 1,599  Labs 12/14/18: HbA1c 9.7%, CBG 304  Labs 08/01/18: HbA1c 8.9%, CBG 89  Labs 02/23/18: HbA1c 8.8%, CBG 201; TSH 5.00, free T4 1.1, free T3 3.2;   Labs 11/23/17: CBG 338  Labs 09/26/17: HbA1c 10.9%, CBG 364, urine glucose 2000, urine ketones negative  Labs 06/27/17: HbA1c 9.0%, CBG 134; TSH 1.49, free T4 1.2, free T3 3.1; CMP normal, except for glucose of 213; cholesterol 134, triglycerides 79, HDL 53, LDL 65; urinary microalbumin/creatinine ratio was 748   Labs 12/31/15: HbA1c 11.7%, CBG 145  Labs 09/29/16: HbA1c 10.2%, CBG 70  Labs 06/01/16: HbA1c >14%. CBG 288; TSH 2.65, free T4 1.2, free T3 2.7; cholesterol 201, triglycerides 106, HDL 54, LDL 127; CMP normal except glucose 130 and albumin 3.5.   Labs 01/05/16: HbA1c 11.0%  Labs 11/04/15: HbA1c 10.5%  Labs 09/12/15: HbA1c 10.3%; CMP normal except for glucose 149 and sodium 133; cholesterol 190, triglycerides  90, HDL 55, LDL 117; urinary microalbumin/creatinine ratio 190; TSH 3.09, free T4 1.3, free T3 3.8  Labs 07/16/15: HbA1c 10.6%, Urine dipstick shows trace ketones  Labs 04/09/15: HbA1c 10.4%  Labs 09/09/14: HbA1c 11.9%; TSH 1.822, free T4 1.02, and free T3 2.6; CMP normal except for glucose of 273; cholesterol 174, triglycerides 181, HDL 46, LDL 92; urinary microalbumin/creatinine ratio 139.2  Labs 06/19/14: HbA1c 10.7%  Labs 05/12/14: CMP normal  Labs 02/19/14: HbA1c 10.2%  Labs 07/31/13: HbA1c 8.5%; CMP normal, except glucose 269; microalbumin/creatinine ratio 51.6; TSH 1.885, free T4 1.31, free T3 3.4   Labs 7/018/14: CMP normal, except glucose of 14; cholesterol 202, triglycerides 85, HDL 58, LDL 127; urinary microalbumin/creatinine ratio 51.9; TSH 2.993, free T4 1.32, free T3 2.8  05/10/12: Creatinine 0.62, TSH 1.025,            Assessment and Plan:   ASSESSMENT:  1-2. Diabetes mellitus/hypoglycemia:   A. The patient's HbA1c is higher today and his CGM readings are higher overall now. He is missing too many insulin doses.   B. He has been having some nocturnal hypoglycemia associated with either not checking BGs at bedtime or not taking adequate bedtime snacks.   Delmar Landau needs continuing adult supervision.  3. Hypertension: Blood pressure is higher today. He may have missed some  lisinopril doses. He also has not been exercising.    4. Peripheral neuropathy: This problem has improved and is not evident today, but will certainly recur if he does not get his BGs under better control.   5. Angiopathy: His angiopathy is still fairly severe. He needs to walk daily. If he exercises on a regular basis and controls his BGs, the blood flow in his legs and feet will likely improve. 6. Goiter/thyroiditis: Thyroid gland has shrunk back to normal size today. The process of waxing and waning of thyroid gland size is c/w evolving Hashimoto's thyroiditis.   7. Hypothyroid: He was mid-range euthyroid  in April 2016, but borderline low in April 2017. His TSH in January 2018 was better, but still above the goal range of 1.0-2.0. His TSH in February 2019 was within the goal range. His TFTs in October 2019 were low, largely due to missing Synthroid doses. His TFTs in January 2021 were mid-euthyroid.  He needs to take his Synthroid daily.  8. Autonomic neuropathy, tachycardia, bloating, and gastroparesis: His heart rate is normal and he is not having any GI symptoms.   9. Combined hyperlipidemia: His lipids in February 2019 and again in January 2021 were normal on his current dose of atorvastatin.  10. Weight loss, unintentional: He has lost more weight, c/w underinsulinization.     11. Dehydration: He is mildly dehydrated today.  12. Medical neglect: [name] OhmChris' sister, [name], and her husband have really been trying to supervise [name] Ohmhris, although he often makes it difficult for them to do so. The chain of supervision broke down when he went to his paternal grandmother's home.   13. 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  14. Microalbuminuria/macroalbuminuria: His microalbumin/creatinine ratio in February 2019 was the highest that it has been in the prior four nears. His ratio in January 2021 was even higher. He needs better control of both his BGs and his BPs. He still needs to take his lisinopril every day.   15. Acute coronary syndrome: This event scared [name] Ohmhris. Since [name] OhmChris does not have a PCP, I [facility]. He has not had a recurrence. He missed his cardiology appointment, so needs to re-schedule.. 16. Pallor of nail beds: His CBC in January 2021 was normal. His CBC on 09/10/19 had normal RBC indices.   PLAN:  1. Diagnostic: CBG today. Reviewed CGM printout and his annual surveillance lab results. Bring in CGM in four weeks for download. Call cardiology to re-schedule.  2. Therapeutic:   A. Continue  the Humalog Mix 75/25 insulin dose in the mornings of 28  units. Continue  the dinner insulin dose to 24 units. However, if the morning BG is <100, reduce the morning insulin dose to 26 units. If the dinner BG is <100, reduce the dinner insulin dose to 23 units. If the BG at bedtime is <200, follow the Small column bedtime snack plan.    B. Take oral medications once a day, at whatever time the family can fit in the doses.   C. I asked that [name] directly supervise his BG checks, insulin dosing, and oral meds.  3. Patient education: We discussed all of the above. I asked [name] OhmChris again to please cooperate with his family members. 4. Follow-up: 3 months. Ensure that Avery DennisonHeather and/or mother can attend.   Level of Service: This visit lasted in excess of 95 minutes. More than 50% of the visit was devoted to counseling.  Molli KnockMichael Alicianna Litchford, MD 09/13/2019  4:34 PM

## 2019-09-18 ENCOUNTER — Other Ambulatory Visit: Payer: Self-pay

## 2019-09-18 ENCOUNTER — Encounter: Payer: Self-pay | Admitting: Physician Assistant

## 2019-09-18 ENCOUNTER — Ambulatory Visit (INDEPENDENT_AMBULATORY_CARE_PROVIDER_SITE_OTHER): Payer: Medicare Other | Admitting: Physician Assistant

## 2019-09-18 VITALS — BP 140/90 | HR 80 | Ht 64.0 in | Wt 146.1 lb

## 2019-09-18 DIAGNOSIS — I249 Acute ischemic heart disease, unspecified: Secondary | ICD-10-CM | POA: Diagnosis not present

## 2019-09-18 DIAGNOSIS — R778 Other specified abnormalities of plasma proteins: Secondary | ICD-10-CM

## 2019-09-18 DIAGNOSIS — I10 Essential (primary) hypertension: Secondary | ICD-10-CM | POA: Diagnosis not present

## 2019-09-18 DIAGNOSIS — E785 Hyperlipidemia, unspecified: Secondary | ICD-10-CM | POA: Diagnosis not present

## 2019-09-18 NOTE — Patient Instructions (Signed)
Medication Instructions:  Your physician recommends that you continue on your current medications as directed. Please refer to the Current Medication list given to you today.  *If you need a refill on your cardiac medications before your next appointment, please call your pharmacy*   Lab Work: None ordered If you have labs (blood work) drawn today and your tests are completely normal, you will receive your results only by: . MyChart Message (if you have MyChart) OR . A paper copy in the mail If you have any lab test that is abnormal or we need to change your treatment, we will call you to review the results.   Testing/Procedures: None ordered   Follow-Up: At [facility], you and your health needs are our priority.  As part of our continuing mission to provide you with exceptional heart care, we have created designated Provider Care Teams.  These Care Teams include your primary Cardiologist (physician) and Advanced Practice Providers (APPs -  Physician Assistants and Nurse Practitioners) who all work together to provide you with the care you need, when you need it.  We recommend signing up for the patient portal called "MyChart".  Sign up information is provided on this After Visit Summary.  MyChart is used to connect with patients for Virtual Visits (Telemedicine).  Patients are able to view lab/test results, encounter notes, upcoming appointments, etc.  Non-urgent messages can be sent to your provider as well.   To learn more about what you can do with MyChart, go to [email or web address].    Your next appointment:   6 month(s)  The format for your next appointment:   In Person  Provider:    You may see Muhammad Arida, MD or one of the following Advanced Practice Providers on your designated Care Team:    Sharrieff Berge, NP  Ryan Dunn, PA-C  Jacquelyn Visser, PA-C    Other Instructions N/A  

## 2019-09-24 ENCOUNTER — Ambulatory Visit: Payer: Medicare Other | Attending: Internal Medicine

## 2019-09-24 DIAGNOSIS — Z23 Encounter for immunization: Secondary | ICD-10-CM

## 2019-09-24 NOTE — Progress Notes (Signed)
   Covid-19 Vaccination Clinic  Name:  [name]    MRN: [id number] DOB: Mar 24, 1986  09/24/2019  [name] was observed post Covid-19 immunization for 15 minutes without incident. He was provided with Vaccine Information Sheet and instruction to access the V-Safe system.   [name] was instructed to call 911 with any severe reactions post vaccine: Marland Kitchen Difficulty breathing  . Swelling of face and throat  . A fast heartbeat  . A bad rash all over body  . Dizziness and weakness   Immunizations Administered    Name Date Dose VIS Date Route   Pfizer COVID-19 Vaccine 09/24/2019  8:53 AM 0.3 mL 07/18/2018 Intramuscular   Manufacturer: [employer], [employer]   Lot: [id number]   NDC: [address]-1

## 2019-10-07 ENCOUNTER — Other Ambulatory Visit (INDEPENDENT_AMBULATORY_CARE_PROVIDER_SITE_OTHER): Payer: Self-pay | Admitting: "Endocrinology

## 2019-10-07 DIAGNOSIS — E1065 Type 1 diabetes mellitus with hyperglycemia: Secondary | ICD-10-CM

## 2019-11-10 ENCOUNTER — Other Ambulatory Visit (INDEPENDENT_AMBULATORY_CARE_PROVIDER_SITE_OTHER): Payer: Self-pay | Admitting: "Endocrinology

## 2019-11-10 DIAGNOSIS — E063 Autoimmune thyroiditis: Secondary | ICD-10-CM

## 2019-11-11 ENCOUNTER — Other Ambulatory Visit (INDEPENDENT_AMBULATORY_CARE_PROVIDER_SITE_OTHER): Payer: Self-pay | Admitting: "Endocrinology

## 2019-11-16 ENCOUNTER — Telehealth (INDEPENDENT_AMBULATORY_CARE_PROVIDER_SITE_OTHER): Payer: Self-pay | Admitting: "Endocrinology

## 2019-11-16 MED ORDER — FREESTYLE LIBRE 14 DAY SENSOR MISC: 5 refills | Status: DC

## 2019-11-16 NOTE — Telephone Encounter (Signed)
  Who's calling (name and relationship to patient) : [name] (self)  Best contact number: [phone]  Provider they see: Dr. Fransico Michael  Reason for call: Needs Freestyle Libre sensors sent to pharmacy.    PRESCRIPTION REFILL ONLY  Name of prescription: Freestyle Libre sensors  Pharmacy: [id number] - [place],  - [address]

## 2019-11-16 NOTE — Telephone Encounter (Signed)
Refill sent to pharmacy.   

## 2019-11-30 DIAGNOSIS — E039 Hypothyroidism, unspecified: Secondary | ICD-10-CM | POA: Insufficient documentation

## 2019-11-30 DIAGNOSIS — E1065 Type 1 diabetes mellitus with hyperglycemia: Secondary | ICD-10-CM | POA: Diagnosis present

## 2020-01-03 ENCOUNTER — Ambulatory Visit (INDEPENDENT_AMBULATORY_CARE_PROVIDER_SITE_OTHER): Payer: Medicare Other | Admitting: "Endocrinology

## 2020-02-20 ENCOUNTER — Ambulatory Visit (INDEPENDENT_AMBULATORY_CARE_PROVIDER_SITE_OTHER): Payer: Medicare Other | Admitting: "Endocrinology

## 2020-02-21 ENCOUNTER — Other Ambulatory Visit (INDEPENDENT_AMBULATORY_CARE_PROVIDER_SITE_OTHER): Payer: Self-pay | Admitting: "Endocrinology

## 2020-02-21 DIAGNOSIS — E1065 Type 1 diabetes mellitus with hyperglycemia: Secondary | ICD-10-CM

## 2020-03-28 ENCOUNTER — Ambulatory Visit: Payer: Medicare Other | Admitting: Cardiovascular Disease

## 2020-03-31 ENCOUNTER — Encounter: Payer: Self-pay | Admitting: Cardiovascular Disease

## 2020-04-03 ENCOUNTER — Other Ambulatory Visit: Payer: Self-pay

## 2020-04-03 ENCOUNTER — Emergency Department (HOSPITAL_COMMUNITY)
Admission: EM | Admit: 2020-04-03 | Discharge: 2020-04-03 | Disposition: A | Discharge status: Home / Self Care | Payer: Medicare Other | Attending: Emergency Medicine | Admitting: Emergency Medicine

## 2020-04-03 ENCOUNTER — Encounter (HOSPITAL_COMMUNITY): Payer: Self-pay

## 2020-04-03 DIAGNOSIS — R739 Hyperglycemia, unspecified: Secondary | ICD-10-CM

## 2020-04-03 DIAGNOSIS — Z79899 Other long term (current) drug therapy: Secondary | ICD-10-CM | POA: Diagnosis not present

## 2020-04-03 DIAGNOSIS — E039 Hypothyroidism, unspecified: Secondary | ICD-10-CM | POA: Insufficient documentation

## 2020-04-03 DIAGNOSIS — E1040 Type 1 diabetes mellitus with diabetic neuropathy, unspecified: Secondary | ICD-10-CM | POA: Insufficient documentation

## 2020-04-03 DIAGNOSIS — E1065 Type 1 diabetes mellitus with hyperglycemia: Secondary | ICD-10-CM | POA: Insufficient documentation

## 2020-04-03 DIAGNOSIS — Z7982 Long term (current) use of aspirin: Secondary | ICD-10-CM | POA: Diagnosis not present

## 2020-04-03 DIAGNOSIS — R42 Dizziness and giddiness: Secondary | ICD-10-CM | POA: Diagnosis present

## 2020-04-03 DIAGNOSIS — I10 Essential (primary) hypertension: Secondary | ICD-10-CM | POA: Diagnosis not present

## 2020-04-03 LAB — BASIC METABOLIC PANEL
Anion gap: 7 (ref 5–15)
BUN: 16 mg/dL (ref 6–20)
CO2: 29 mmol/L (ref 22–32)
Calcium: 9.1 mg/dL (ref 8.9–10.3)
Chloride: 99 mmol/L (ref 98–111)
Creatinine, Ser: 1.44 mg/dL — ABNORMAL HIGH (ref 0.61–1.24)
GFR, Estimated: >60 mL/min (ref >60)
Glucose, Bld: 341 mg/dL — ABNORMAL HIGH (ref 70–99)
Potassium: 4.3 mmol/L (ref 3.5–5.1)
Sodium: 135 mmol/L (ref 135–145)

## 2020-04-03 LAB — URINALYSIS, ROUTINE W REFLEX MICROSCOPIC
Bacteria, UA: NONE SEEN
Bilirubin Urine: NEGATIVE
Glucose, UA: >=500 mg/dL — AB
Hgb urine dipstick: NEGATIVE
Ketones, ur: NEGATIVE mg/dL
Leukocytes,Ua: NEGATIVE
Nitrite: NEGATIVE
Protein, ur: 100 mg/dL — AB
Specific Gravity, Urine: 1.025 (ref 1.005–1.030)
pH: 5 (ref 5.0–8.0)

## 2020-04-03 LAB — CBC
HCT: 38.7 % — ABNORMAL LOW (ref 39.0–52.0)
Hemoglobin: 13.4 g/dL (ref 13.0–17.0)
MCH: 31.5 pg (ref 26.0–34.0)
MCHC: 34.6 g/dL (ref 30.0–36.0)
MCV: 90.8 fL (ref 80.0–100.0)
Platelets: 230 10*3/uL (ref 150–400)
RBC: 4.26 MIL/uL (ref 4.22–5.81)
RDW: 12.3 % (ref 11.5–15.5)
WBC: 7.4 10*3/uL (ref 4.0–10.5)
nRBC: 0 % (ref 0.0–0.2)

## 2020-04-03 LAB — CBG MONITORING, ED
Glucose-Capillary: 328 mg/dL — ABNORMAL HIGH (ref 70–99)
Glucose-Capillary: 155 mg/dL — ABNORMAL HIGH (ref 70–99)

## 2020-04-03 MED ORDER — LACTATED RINGERS IV BOLUS
1000.0000 mL | Freq: Once | INTRAVENOUS | Status: AC
  Administered 2020-04-03: 1000 mL via INTRAVENOUS

## 2020-04-03 NOTE — ED Notes (Signed)
Ambulated pt in the hallway. Pt exhibits strong steady gait, and denies feeling lightheaded or dizzy.

## 2020-04-03 NOTE — ED Notes (Signed)
Pt discharged ambulatory with family. All questions and concerns addressed. No complaints at this time.   

## 2020-04-03 NOTE — Discharge Instructions (Addendum)
Please take your insulin as prescribed

## 2020-04-03 NOTE — ED Triage Notes (Signed)
Pt arrives to ED w/ [facility] from home w/ c/o near syncope. Pt felt dizzy, lightheaded and then laid down and began to feel a little better. Hx DM1, CBG 419 w/ EMS. Pt is legally blind. EMS VSS. Pt received 250 mL bolus NS w/ EMS.
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
## 2020-04-22 DIAGNOSIS — Z91199 Patient's noncompliance with other medical treatment and regimen due to unspecified reason: Secondary | ICD-10-CM

## 2020-04-23 ENCOUNTER — Ambulatory Visit (INDEPENDENT_AMBULATORY_CARE_PROVIDER_SITE_OTHER): Payer: Medicare Other | Admitting: "Endocrinology

## 2020-04-23 NOTE — Progress Notes (Deleted)
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  HISTORY OF PRESENT ILLNESS:   [name] is a 34 y.o. Caucasian young man.  Tajon was accompanied by his mother.      1. I first consulted on [name] when he was admitted to the Pediatric Ward at the [facility] in May 2007.  A. The patient was diagnosed with T1DM at age 33 in about 2001 or 2002. We have few details about his medical care until 2007. We know that in about 2006 he saw a pediatric endocrinologist at [facility], [facility], [facility]. Unfortunately, the family chose not to return to see the endocrinologist. In about April of 2007, his primary care provider, Dr. Reed Breech of [facility], started the patient on an insulin pump. Unfortunately, neither the patient nor the family were capable of utilizing the pump's technology effectively.   B. On 10/02/2005 the patient was admitted to the [facility] for poorly controlled type 1 diabetes. I consulted on the patient at that time. 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 He did not have the insight or intelligence to perform the daily tasks of diabetes self-care independently or to utilize the insulin pump. It also appeared that there was not a great deal of adult supervision and support within his family. We tried to teach him and his family how to follow a multiple daily injection of insulin plan with Lantus and Novolog insulin, but that attempt failed rapidly. I changed his regimen to  Novolog Mix 70/30 insulin taken twice daily. He was to take 26 units each morning at breakfast and 14 units each evening at supper. I also gave him a sliding scale for Humalog lispro with different doses of insulin to be used at breakfast, lunch, supper, and bedtime in addition to his 70/30 insulin when his family members were available to check the blood sugars and give the additional insulin. The patient's family members agreed to supervise his care.  2. Unfortunately, [name] has frequently not had the adult supervision and support that he needed. In the last 12 years, the patient has been scheduled to return to our clinic for follow up visits approximately every 3 months, but has actually only returned to our clinic for follow up visits 1-4 times per year, although the family has done much better in the past three years. In 2012 he had 3 visits and was admitted once. In 2013 he had one admission in December, followed by a clinic visit later that month. In 2014 he had 4 clinic visits. In 2015 he had two clinic visits. In fairness, however, I should note that I was unavailable to see him for two months in 2015 due to my severe leg injury and post-operative recuperation. He had 4 visits in 2016 and 4 visits in 2017, but in 2018 he only had 3 visits. Today is his fourth visit in 2019. Since moving in with his sister and her family in January 2019, the adult supervision and support in [place]' life have substantially improved.  3. During the past 14 years [name] has had multiple medical problems:  A. T1DM: His hemoglobin A1c values have  been mostly in the 9.0-14.0% range. He has the following complications of diabetes: diabetic angiopathy, peripheral neuropathy, autonomic neuropathy, inappropriate sinus tachycardia, gastroparesis and postprandial abdominal bloating, chronic constipation, urinary microalbuminuria, and occasional but significant hypoglycemia. Due to health insurance restrictions he has taken Novolog  70/30 Mix insulin at some times and Humalog 75/25 Mix insulin at other times.   B. Autoimmune hypothyroidism: In November 2007, the patient was diagnosed with hypothyroidism secondary to Hashimoto's disease and was started on Synthroid at a dose of 25 mcg per day.   C. Hypertension: In November 2008, he was diagnosed with hypertension and started on lisinopril, 5 mg per day.   D. Hypercholesterolemia: In July 2014 he was diagnosed with hypercholesterolemia and started on atorvastatin, 10 mg/day.  E. Noncompliance/medical neglect/inadequate medical supervision: Unfortunately, [name] has frequently missed BG checks, doses of insulin, and doses of his oral medications. Parental/family supervision and support have varied in consistency and in quality throughout these years. However, since [name] has been living with [name] and her family, the supervision and support have been much better.  [name] had an episode of chest pain and pallor on 11/05/18. He was admitted to [facility] where he was diagnosed with acute coronary syndrome. That episode scared him and caused him to try to be somewhat more compliant with his DM care plan.   4. [name]' last [facility] clinic visit was on 09/13/2019.  At that visit we continued his Humalog Mix 75/25 insulin doses of 28 units in the mornings and 24 units in the evenings. He was supposed to return in 3 months, but did not.   A. In the interim he had been healthy until 09/09/19 when he presented to the ED at [facility] with hyperglycemia, DKA, and dehydration. His CBG was 559. His serum glucose was 573, CO2 19, anion gap 19, BHOB 5.9% (ref 0.05-00.27), HbA1c 9.3%, urine glucose >500, urine ketones 80. He was treated with iv insulin per [facility]. He was discharged on 09/11/19. In retrospect, he had been visiting his grandmother in [place] for 3 days and had forgotten to bring his insulin with him. When he arrived home on 09/09/19 his mother recognized that he was sick and had severe ketonuria, so  she gave him 29 units of 70/30 insulin and then immediately took him to the ED.   B. BG control has still been "up and down". His morning BGs are usually pre-prandial. Later BGs have been mostly pre-prandial. Since his last visit he had not had many low BGs, but has been low 1-2 times since then.    C. He has not really been trying to eat better. He has had some physical activity.   D. He continues on his morning 75/25 insulin dose of 28 units and his evening dose of 24 units.  He says he is not missing many insulin doses, but his printout suggests otherwise.    E. His dentures are working well.   [name]' sister, [name], now supervises his care in her home most of the time.     G. He now has both Medicaid and Medicare coverage, however, [place] Medicaid will no longer pay for medications. He receives his insulins and DM supplies through [facility].   H.  His skin lesions have healed up. He is not doing much itching and scratching.   I. He is supposed to be taking 25 mcg of Synthroid daily, 10 mg of lisinopril daily, and 10 mg of atorvastatin daily. He says that [employer]  is supervising him. He says he is being "more responsible about taking my medicines".    5. Pertinent Review of Systems:  Constitutional: The patient feels "good overall", but still a little bit weak. However, when he becomes hypoglycemic or extremely hyperglycemic, he can still be very irritable and sometimes even combative.  Eyes: Vision is about the same. He is legally blind. He had his annual eye exam in October or November 2019. He was told that his diabetes is not interfering with his eyes.  Neck: The patient has no complaints of anterior neck swelling, soreness, tenderness, pressure, discomfort, or difficulty swallowing.  Heart: The patient has no other complaints of palpitations, irregular heat beats, chest pain, or chest pressure. Gastrointestinal: He does not usually have post-prandial bloating. He is no longer  constipated. The patient has no other complaints of excessive hunger, acid reflux, stomach aches or pains, diarrhea, or constipation. Legs: He no longer has pains in the right upper thigh. Muscle mass and strength seem normal. There are no other complaints of numbness, tingling, burning, or pain. No edema is noted. Feet: He says he no longer picks at his toe nails. There are no recent complaints of numbness, tingling, burning, or pain. No edema is noted. GU: He rarely has nocturia. Hypoglycemia: He has not had many low BGs in the past month.    .    6. BG meter review: No data   7 FreeStyle Libre download: His average daily SGs have varied from 100 to 276 when he is at home. Some days he takes his insulin twice daily, but sometimes only once. His SGs when he was at his grandmother's home were all >400. He has had several SGS <70, but he can't identify a pattern.   PAST MEDICAL, FAMILY, AND SOCIAL HISTORY:  Past Medical History:  Diagnosis Date  . Angiopathy, diabetic (HCC)   . Diabetes mellitus    Type 1, diagnosed age 89, with frequent DKA admissions, difficult to control due to MR  . Diabetic nephropathy (HCC)    Started on Lisinopril 5 mg  . Diabetic peripheral neuropathy (HCC)   . Dysmorphic features   . Goiter   . Hypertension   . Hypoglycemia associated with diabetes (HCC)   . Hypothyroidism   . Impaired cognition    mention of mental retardation  . Mental retardation   . Moderate or severe vision impairment, both eyes, impairment level not further specified   . 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 Thyroiditis, autoimmune     Family History  Problem Relation Age of Onset  . Vision loss Father   . Retinitis pigmentosa Mother   . Diabetes Maternal Grandmother        AODM  . Diabetes Paternal Grandmother        AODM  . Diabetes Cousin        Second cousin has juvenile-onset DM.     Current Outpatient Medications:  .  aspirin EC 81 MG tablet, Take  1 tablet (81 mg total) by mouth daily., Disp: 90 tablet, Rfl: 3 .  atorvastatin (LIPITOR) 10 MG tablet, TAKE 1 TABLET (10 MG TOTAL) BY MOUTH DAILY. (Patient taking differently: Take 10 mg by mouth daily. ), Disp: 90 tablet, Rfl: 0 .  Continuous Blood Gluc Sensor (FREESTYLE LIBRE 14 DAY SENSOR) MISC, Change every 14 days, Disp: 3 each, Rfl: 5 .  ibuprofen (ADVIL) 200 MG tablet, Take 200 mg by mouth every 6 (six) hours  as needed for fever or moderate pain (throat pain)., Disp: , Rfl:  .  Insulin Lispro Prot & Lispro (HUMALOG MIX 75/25 KWIKPEN) (75-25) 100 UNIT/ML Kwikpen, INJECT 28 UNITS OF INSULIN IN THE MORNING AND 24 UNITS IN THE EVENING, Disp: 15 mL, Rfl: 5 .  Insulin Pen Needle (PEN NEEDLES) 32G X 6 MM MISC, 1 application by Does not apply route daily as needed. Use to inject insulin 6 times daily., Disp: 200 each, Rfl: 5 .  lisinopril (ZESTRIL) 10 MG tablet, TAKE 1 TABLET BY MOUTH EVERY DAY, Disp: 90 tablet, Rfl: 1 .  ONETOUCH VERIO test strip, USE TO CHECK BLOOD SUGAR 3 TIMES DAILY AS DIRECTED, Disp: 300 strip, Rfl: 1 .  SYNTHROID 25 MCG tablet, TAKE 1 TABLET BY MOUTH DAILY BEFORE BREAKFAST. (Patient taking differently: Take 25 mcg by mouth daily before breakfast. ), Disp: 90 tablet, Rfl: 1  Allergies as of 04/23/2020  . (No Known Allergies)    1. Work and Family: [name] remains unemployed. He is still living with his sister, [name], and her family in [place]. [name] stays at home and so can look after [name]. [name]' friend, Gerlene Burdock, also usually lives in the house, but is now helping to take care of [name]' grandfather in the grandfather's home. When they are together, [name] and Gerlene Burdock help each other out and exercise together. Mom has gone back to work. Mom is also trying to care for her mother and father, so she is pulled in many different directions.  2. Activities: [name] has not been very active.  3. Smoking, alcohol, or drugs: No tobacco or alcohol or drugs.  4. Primary Care  Provider: None at present  REVIEW OF SYSTEMS: There are no other significant problems involving Armaan's other body systems.   Objective:  Vital Signs:  There were no vitals taken for this visit.   Ht Readings from Last 3 Encounters:  04/03/20 5\' 4"  (1.626 m)  09/18/19 5\' 4"  (1.626 m)  09/10/19 5\' 4"  (1.626 m)   Wt Readings from Last 3 Encounters:  09/18/19 146 lb 2 oz (66.3 kg)  09/13/19 142 lb 6.4 oz (64.6 kg)  09/10/19 150 lb (68 kg)   There is no height or weight on file to calculate BSA.  Facility age limit for growth percentiles is 20 years. Facility age limit for growth percentiles is 20 years.  PHYSICAL EXAM:  Constitutional: The patient appears healthy, but his gait is still somewhat wide-based and shuffling due to his visual handicap. His weight has decreased by 8 pounds. He was fairly engaged and interactive at his level today. He still does not really remember much about checking his BGs and taking his insulins. His thought processes are still very concrete. His insight remains poor. Although he really does not understand or comprehend much about diabetes, about why he needs to take care of himself, and about what will happen to him if he does not take care of himself, this recent episode of DKA really scared him.   Face: The face is somewhat dysmorphic. His face is very similar to mom's face and [name]'s face. Eyes: There is no obvious arcus or proptosis. His eyes are slightly dry.  Mouth: The oropharynx appears normal. His geographic tongue has resolved. He no longer has any maxillary teeth, but he is wearing his new dentures. His mouth is fairly dry.    Neck: The neck appears to be visibly normal. No carotid bruits are noted. The thyroid gland has shrunk back to  normal at about 20 grams today. The consistency of the thyroid gland is normal today. The thyroid gland is not tender to palpation. Lungs: The lungs are clear to auscultation. Air movement is good. Heart:  Heart rate is rapid, but rhythm is regular. Heart sounds S1 and S2 are normal. I did not appreciate any pathologic cardiac murmurs. Abdomen: The abdomen is normal in size, but he has more adipose tissue today. Bowel sounds are normal. There is no obvious hepatomegaly, splenomegaly, or other mass effect.  Arms: Muscle size and bulk are normal for age. He has no new areas of excoriation on his arms. The older areas have healed.  Hands: There is no obvious tremor. Phalangeal and metacarpophalangeal joints are normal. Palmar muscles are normal. Palmar skin is normal. Palmar moisture is also normal. Nail beds are pallid. Legs: Muscles appear normal for age. No edema is present. Feet: Feet are normally formed. DP and PT pulses are trace. His left great toenail has healed from his prior stubbing injury.  Neurologic: Strength is normal for age in both the upper and lower extremities. Muscle tone is normal. Sensation to touch is normal in both the legs and feet.    LAB DATA:   Labs 09/13/19: CBG 406; urine glucose positive, urine ketones negative; He apparently did not take his AM insulin until later this afternoon.   Labs 09/10/19: HbA1c 9.3%  Labs 06/15/19: HbA1c 9.0%, CBG 196; TSH 1.98, free T4 1.2, free T3 3.4; CMP normal; CBC normal, cholesterol 137, triglycerides 96, HDL 46, LDL 73; urinary microalbumin/creatinine ratio 1,599  Labs 12/14/18: HbA1c 9.7%, CBG 304  Labs 08/01/18: HbA1c 8.9%, CBG 89  Labs 02/23/18: HbA1c 8.8%, CBG 201; TSH 5.00, free T4 1.1, free T3 3.2;   Labs 11/23/17: CBG 338  Labs 09/26/17: HbA1c 10.9%, CBG 364, urine glucose 2000, urine ketones negative  Labs 06/27/17: HbA1c 9.0%, CBG 134; TSH 1.49, free T4 1.2, free T3 3.1; CMP normal, except for glucose of 213; cholesterol 134, triglycerides 79, HDL 53, LDL 65; urinary microalbumin/creatinine ratio was 748   Labs 12/31/15: HbA1c 11.7%, CBG 145  Labs 09/29/16: HbA1c 10.2%, CBG 70  Labs 06/01/16: HbA1c >14%. CBG 288; TSH 2.65,  free T4 1.2, free T3 2.7; cholesterol 201, triglycerides 106, HDL 54, LDL 127; CMP normal except glucose 130 and albumin 3.5.   Labs 01/05/16: HbA1c 11.0%  Labs 11/04/15: HbA1c 10.5%  Labs 09/12/15: HbA1c 10.3%; CMP normal except for glucose 149 and sodium 133; cholesterol 190, triglycerides 90, HDL 55, LDL 117; urinary microalbumin/creatinine ratio 190; TSH 3.09, free T4 1.3, free T3 3.8  Labs 07/16/15: HbA1c 10.6%, Urine dipstick shows trace ketones  Labs 04/09/15: HbA1c 10.4%  Labs 09/09/14: HbA1c 11.9%; TSH 1.822, free T4 1.02, and free T3 2.6; CMP normal except for glucose of 273; cholesterol 174, triglycerides 181, HDL 46, LDL 92; urinary microalbumin/creatinine ratio 139.2  Labs 06/19/14: HbA1c 10.7%  Labs 05/12/14: CMP normal  Labs 02/19/14: HbA1c 10.2%  Labs 07/31/13: HbA1c 8.5%; CMP normal, except glucose 269; microalbumin/creatinine ratio 51.6; TSH 1.885, free T4 1.31, free T3 3.4   Labs 7/018/14: CMP normal, except glucose of 14; cholesterol 202, triglycerides 85, HDL 58, LDL 127; urinary microalbumin/creatinine ratio 51.9; TSH 2.993, free T4 1.32, free T3 2.8  05/10/12: Creatinine 0.62, TSH 1.025,            Assessment and Plan:   ASSESSMENT:  1-2. Diabetes mellitus/hypoglycemia:   A. The patient's HbA1c is higher today and his CGM readings are higher  overall now. He is missing too many insulin doses.   B. He has been having some nocturnal hypoglycemia associated with either not checking BGs at bedtime or not taking adequate bedtime snacks.   [name] needs continuing adult supervision.  3. Hypertension: Blood pressure is higher today. He may have missed some lisinopril doses. He also has not been exercising.    4. Peripheral neuropathy: This problem has improved and is not evident today, but will certainly recur if he does not get his BGs under better control.   5. Angiopathy: His angiopathy is still fairly severe. He needs to walk daily. If he exercises on a regular basis  and controls his BGs, the blood flow in his legs and feet will likely improve. 6. Goiter/thyroiditis: Thyroid gland has shrunk back to normal size today. The process of waxing and waning of thyroid gland size is c/w evolving Hashimoto's thyroiditis.   7. Hypothyroid: He was mid-range euthyroid in April 2016, but borderline low in April 2017. His TSH in January 2018 was better, but still above the goal range of 1.0-2.0. His TSH in February 2019 was within the goal range. His TFTs in October 2019 were low, largely due to missing Synthroid doses. His TFTs in January 2021 were mid-euthyroid.  He needs to take his Synthroid daily.  8. Autonomic neuropathy, tachycardia, bloating, and gastroparesis: His heart rate is normal and he is not having any GI symptoms.   9. Combined hyperlipidemia: His lipids in February 2019 and again in January 2021 were normal on his current dose of atorvastatin.  10. Weight loss, unintentional: He has lost more weight, c/w underinsulinization.     11. Dehydration: He is mildly dehydrated today.  12. Medical neglect: [name]' sister, [name], and her husband have really been trying to supervise [name], although he often makes it difficult for them to do so. The chain of supervision broke down when he went to his paternal grandmother's home.   13. 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  14. Microalbuminuria/macroalbuminuria: His microalbumin/creatinine ratio in February 2019 was the highest that it has been in the prior four nears. His ratio in January 2021 was even higher. He needs better control of both his BGs and his BPs. He still needs to take his lisinopril every day.   15. Acute coronary syndrome: This event scared [name]. Since [name] does not have a PCP, I referred him to [facility] for further E&M. He has not had a recurrence. He missed his cardiology appointment, so needs to re-schedule.. 16. Pallor of nail beds: His CBC in January 2021 was normal. His CBC on  09/10/19 had normal RBC indices.   PLAN:  1. Diagnostic: CBG today. Reviewed CGM printout and his annual surveillance lab results. Bring in CGM in four weeks for download. Call cardiology to re-schedule.  2. Therapeutic:   A. Continue  the Humalog Mix 75/25 insulin dose in the mornings of 28 units. Continue  the dinner insulin dose to 24 units. However, if the morning BG is <100, reduce the morning insulin dose to 26 units. If the dinner BG is <100, reduce the dinner insulin dose to 23 units. If the BG at bedtime is <200, follow the Small column bedtime snack plan.    B. Take oral medications once a day, at whatever time the family can fit in the doses.   C. I asked that [name] directly supervise his BG checks, insulin dosing, and oral meds.  3. Patient education: We discussed  all of the above. I asked [name] again to please cooperate with his family members. 4. Follow-up: 3 months. Ensure that [employer] and/or mother can attend.   Level of Service: This visit lasted in excess of 95 minutes. More than 50% of the visit was devoted to counseling.  Molli Knock, MD 04/23/2020 8:45 AM

## 2020-06-19 ENCOUNTER — Encounter (INDEPENDENT_AMBULATORY_CARE_PROVIDER_SITE_OTHER): Payer: Self-pay | Admitting: "Endocrinology

## 2020-06-27 ENCOUNTER — Ambulatory Visit (INDEPENDENT_AMBULATORY_CARE_PROVIDER_SITE_OTHER): Payer: Medicaid Other | Admitting: "Endocrinology

## 2020-07-12 ENCOUNTER — Other Ambulatory Visit (INDEPENDENT_AMBULATORY_CARE_PROVIDER_SITE_OTHER): Payer: Self-pay | Admitting: "Endocrinology

## 2020-07-12 DIAGNOSIS — E1065 Type 1 diabetes mellitus with hyperglycemia: Secondary | ICD-10-CM

## 2020-07-14 ENCOUNTER — Other Ambulatory Visit (INDEPENDENT_AMBULATORY_CARE_PROVIDER_SITE_OTHER): Payer: Self-pay

## 2020-07-14 ENCOUNTER — Telehealth (INDEPENDENT_AMBULATORY_CARE_PROVIDER_SITE_OTHER): Payer: Self-pay | Admitting: "Endocrinology

## 2020-07-14 DIAGNOSIS — E1065 Type 1 diabetes mellitus with hyperglycemia: Secondary | ICD-10-CM

## 2020-07-14 MED ORDER — INSULIN LISPRO PROT & LISPRO (75-25 MIX) 100 UNIT/ML KWIKPEN: PEN_INJECTOR | SUBCUTANEOUS | 1 refills | Status: DC

## 2020-07-14 NOTE — Telephone Encounter (Signed)
  Who's calling (name and relationship to patient) : Lawson Fiscal (mom)  Best contact number: [phone]  Provider they see: Dr. Fransico Michael  Reason for call: Patient is out of insulin and needs refill sent to pharmacy.    PRESCRIPTION REFILL ONLY  Name of prescription: Insulin Lispro Prot & Lispro (HUMALOG MIX 75/25 KWIKPEN) (75-25) 100 UNIT/ML Kwikpen  Pharmacy: [id number] - [place], [place] - [address]

## 2020-07-14 NOTE — Telephone Encounter (Signed)
Refill sent.

## 2020-08-14 ENCOUNTER — Ambulatory Visit (INDEPENDENT_AMBULATORY_CARE_PROVIDER_SITE_OTHER): Payer: Medicaid Other | Admitting: "Endocrinology

## 2020-08-14 NOTE — Progress Notes (Deleted)
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  HISTORY OF PRESENT ILLNESS:   [name] is a 35 y.o. Caucasian young man.  Banner was accompanied by his mother.      1. I first consulted on [name] when he was admitted to the Pediatric Ward at the [facility] in May 2007.  A. The patient was diagnosed with T1DM at age 49 in about 2001 or 2002. We have few details about his medical care until 2007. We know that in about 2006 he saw a pediatric endocrinologist at [facility], [facility], [facility]. Unfortunately, the family chose not to return to see the endocrinologist. In about April of 2007, his primary care provider, Dr. Reed Breech of [facility], started the patient on an insulin pump. Unfortunately, neither the patient nor the family were capable of utilizing the pump's technology effectively.   B. On 10/02/2005 the patient was admitted to the [facility] for poorly controlled type 1 diabetes. I consulted on the patient at that time. 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 He did not have the insight or intelligence to perform the daily tasks of diabetes self-care independently or to utilize the insulin pump. It also appeared that there was not a great deal of adult supervision and support within his family. We tried to teach him and his family how to follow a multiple daily injection of insulin plan with Lantus and Novolog insulin, but that attempt failed rapidly. I changed his regimen to  Novolog Mix 70/30 insulin taken twice daily. He was to take 26 units each morning at breakfast and 14 units each evening at supper. I also gave him a sliding scale for Humalog lispro with different doses of insulin to be used at breakfast, lunch, supper, and bedtime in addition to his 70/30 insulin when his family members were available to check the blood sugars and give the additional insulin. The patient's family members agreed to supervise his care.  2. Unfortunately, [name] has frequently not had the adult supervision and support that he needed. In the last 12 years, the patient has been scheduled to return to our clinic for follow up visits approximately every 3 months, but has actually only returned to our clinic for follow up visits 1-4 times per year, although the family has done much better in the past three years. In 2012 he had 3 visits and was admitted once. In 2013 he had one admission in December, followed by a clinic visit later that month. In 2014 he had 4 clinic visits. In 2015 he had two clinic visits. In fairness, however, I should note that I was unavailable to see him for two months in 2015 due to my severe leg injury and post-operative recuperation. He had 4 visits in 2016 and 4 visits in 2017, but in 2018 he only had 3 visits. Today is his fourth visit in 2019. Since moving in with his sister and her family in January 2019, the adult supervision and support in Nebo' life have substantially improved.  3. During the past 14 years [name] has had multiple medical problems:  A. T1DM: His hemoglobin A1c values have  been mostly in the 9.0-14.0% range. He has the following complications of diabetes: diabetic angiopathy, peripheral neuropathy, autonomic neuropathy, inappropriate sinus tachycardia, gastroparesis and postprandial abdominal bloating, chronic constipation, urinary microalbuminuria, and occasional but significant hypoglycemia. Due to health insurance restrictions he has taken Novolog  70/30 Mix insulin at some times and Humalog 75/25 Mix insulin at other times.   B. Autoimmune hypothyroidism: In November 2007, the patient was diagnosed with hypothyroidism secondary to Hashimoto's disease and was started on Synthroid at a dose of 25 mcg per day.   C. Hypertension: In November 2008, he was diagnosed with hypertension and started on lisinopril, 5 mg per day.   D. Hypercholesterolemia: In July 2014 he was diagnosed with hypercholesterolemia and started on atorvastatin, 10 mg/day.  E. Noncompliance/medical neglect/inadequate medical supervision: Unfortunately, [name] has frequently missed BG checks, doses of insulin, and doses of his oral medications. Parental/family supervision and support have varied in consistency and in quality throughout these years. However, since [name] has been living with [name] SetaHeather and her family, the supervision and support have been much better.  [name]. [name] had an episode of chest pain and pallor on 11/05/18. He was admitted to [facility] where he was diagnosed with acute coronary syndrome. That episode scared him and caused him to try to be somewhat more compliant with his DM care plan.   4. [name]' last [facility] clinic visit was on 09/13/2019.  At that visit we continued his Humalog Mix 75/25 insulin doses of 28 units in the mornings and 24 units in the evenings. He was supposed to return to clinic in 3 months, but did not. Appointments qere cancelled in August and September 2921. He was No Show in December 2021. Appointment in February 2022 was cancelled.   A. In the interim he had been healthy until 09/09/19 when he presented to the ED at [facility] with hyperglycemia, DKA, and dehydration. His CBG was 559. His serum glucose was 573, CO2 19, anion gap 19, BHOB 5.9% (ref 0.05-00.27), HbA1c 9.3%, urine glucose >500, urine ketones 80. He was treated with iv insulin per [facility]. He was discharged on 09/11/19. In retrospect, he had been visiting his grandmother in [place] for 3 days and  had forgotten to bring his insulin with him. When he arrived home on 09/09/19 his mother recognized that he was sick and had severe ketonuria, so she gave him 29 units of 70/30 insulin and then immediately took him to the ED.   B. BG control has still been "up and down". His morning BGs are usually pre-prandial. Later BGs have been mostly pre-prandial. Since his last visit he had not had many low BGs, but has been low 1-2 times since then.    C. He has not really been trying to eat better. He has had some physical activity.   D. He continues on his morning 75/25 insulin dose of 28 units and his evening dose of 24 units.  He says he is not missing many insulin doses, but his printout suggests otherwise.    E. His dentures are working well.   [name]. [name]' sister, [name] SetaHeather, now supervises his care in her home most of the time.     G. He now has both Medicaid and Medicare coverage, however, [place] Medicaid will no longer pay for medications. He receives his insulins and DM supplies through [facility].   H.  His skin lesions have healed up. He is not doing much itching and scratching.   I. He is supposed  to be taking 25 mcg of Synthroid daily, 10 mg of lisinopril daily, and 10 mg of atorvastatin daily. He says that [name] is supervising him. He says he is being "more responsible about taking my medicines".    5. Pertinent Review of Systems:  Constitutional: The patient feels "good overall", but still a little bit weak. However, when he becomes hypoglycemic or extremely hyperglycemic, he can still be very irritable and sometimes even combative.  Eyes: Vision is about the same. He is legally blind. He had his annual eye exam in October or November 2019. He was told that his diabetes is not interfering with his eyes.  Neck: The patient has no complaints of anterior neck swelling, soreness, tenderness, pressure, discomfort, or difficulty swallowing.  Heart: The patient has no other complaints of palpitations,  irregular heat beats, chest pain, or chest pressure. Gastrointestinal: He does not usually have post-prandial bloating. He is no longer constipated. The patient has no other complaints of excessive hunger, acid reflux, stomach aches or pains, diarrhea, or constipation. Legs: He no longer has pains in the right upper thigh. Muscle mass and strength seem normal. There are no other complaints of numbness, tingling, burning, or pain. No edema is noted. Feet: He says he no longer picks at his toe nails. There are no recent complaints of numbness, tingling, burning, or pain. No edema is noted. GU: He rarely has nocturia. Hypoglycemia: He has not had many low BGs in the past month.    .    6. BG meter review: No data   7 FreeStyle Libre download: His average daily SGs have varied from 100 to 276 when he is at home. Some days he takes his insulin twice daily, but sometimes only once. His SGs when he was at his grandmother's home were all >400. He has had several SGS <70, but he can't identify a pattern.   PAST MEDICAL, FAMILY, AND SOCIAL HISTORY:  Past Medical History:  Diagnosis Date  . Angiopathy, diabetic (HCC)   . Diabetes mellitus    Type 1, diagnosed age 89, with frequent DKA admissions, difficult to control due to MR  . Diabetic nephropathy (HCC)    Started on Lisinopril 5 mg  . Diabetic peripheral neuropathy (HCC)   . Dysmorphic features   . Goiter   . Hypertension   . Hypoglycemia associated with diabetes (HCC)   . Hypothyroidism   . Impaired cognition    mention of mental retardation  . Mental retardation   . Moderate or severe vision impairment, both eyes, impairment level not further specified   . 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 Thyroiditis, autoimmune     Family History  Problem Relation Age of Onset  . Vision loss Father   . Retinitis pigmentosa Mother   . Diabetes Maternal Grandmother        AODM  . Diabetes Paternal Grandmother        AODM   . Diabetes Cousin        Second cousin has juvenile-onset DM.     Current Outpatient Medications:  .  aspirin EC 81 MG tablet, Take 1 tablet (81 mg total) by mouth daily., Disp: 90 tablet, Rfl: 3 .  atorvastatin (LIPITOR) 10 MG tablet, TAKE 1 TABLET (10 MG TOTAL) BY MOUTH DAILY. (Patient taking differently: Take 10 mg by mouth daily. ), Disp: 90 tablet, Rfl: 0 .  Continuous Blood Gluc Sensor (FREESTYLE LIBRE 14 DAY SENSOR) MISC, Change every  14 days, Disp: 3 each, Rfl: 5 .  ibuprofen (ADVIL) 200 MG tablet, Take 200 mg by mouth every 6 (six) hours as needed for fever or moderate pain (throat pain)., Disp: , Rfl:  .  Insulin Lispro Prot & Lispro (HUMALOG MIX 75/25 KWIKPEN) (75-25) 100 UNIT/ML Kwikpen, INJECT 28 UNITS OF INSULIN IN THE MORNING AND 24 UNITS IN THE EVENING, Disp: 15 mL, Rfl: 1 .  Insulin Pen Needle (PEN NEEDLES) 32G X 6 MM MISC, 1 application by Does not apply route daily as needed. Use to inject insulin 6 times daily., Disp: 200 each, Rfl: 5 .  lisinopril (ZESTRIL) 10 MG tablet, TAKE 1 TABLET BY MOUTH EVERY DAY, Disp: 90 tablet, Rfl: 1 .  ONETOUCH VERIO test strip, USE TO CHECK BLOOD SUGAR 3 TIMES DAILY AS DIRECTED, Disp: 300 strip, Rfl: 1 .  SYNTHROID 25 MCG tablet, TAKE 1 TABLET BY MOUTH DAILY BEFORE BREAKFAST. (Patient taking differently: Take 25 mcg by mouth daily before breakfast. ), Disp: 90 tablet, Rfl: 1  Allergies as of 08/14/2020  . (No Known Allergies)    1. Work and Family: [name] remains unemployed. He is still living with his sister, [name], and her family in [place]. [name] stays at home and so can look after [name]. [name]' friend, [name], also usually lives in the house, but is now helping to take care of [name]' grandfather in the grandfather's home. When they are together, [name] and [name] help each other out and exercise together. Mom has gone back to work. Mom is also trying to care for her mother and father, so she is pulled in many different  directions.  2. Activities: [name] has not been very active.  3. Smoking, alcohol, or drugs: No tobacco or alcohol or drugs.  4. Primary Care Provider: None at present  REVIEW OF SYSTEMS: There are no other significant problems involving [name]'s other body systems.   Objective:  Vital Signs:  There were no vitals taken for this visit.   Ht Readings from Last 3 Encounters:  04/03/20  (1.626 m)  09/18/19  (1.626 m)  09/10/19  (1.626 m)   Wt Readings from Last 3 Encounters:  09/18/19 146 lb 2 oz (66.3 kg)  09/13/19 142 lb 6.4 oz (64.6 kg)  09/10/19 150 lb (68 kg)   There is no height or weight on file to calculate BSA.  Facility age limit for growth percentiles is 20 years. Facility age limit for growth percentiles is 20 years.  PHYSICAL EXAM:  Constitutional: The patient appears healthy, but his gait is still somewhat wide-based and shuffling due to his visual handicap. His weight has decreased by 8 pounds. He was fairly engaged and interactive at his level today. He still does not really remember much about checking his BGs and taking his insulins. His thought processes are still very concrete. His insight remains poor. Although he really does not understand or comprehend much about diabetes, about why he needs to take care of himself, and about what will happen to him if he does not take care of himself, this recent episode of DKA really scared him.   Face: The face is somewhat dysmorphic. His face is very similar to mom's face and [name]'s face. Eyes: There is no obvious arcus or proptosis. His eyes are slightly dry.  Mouth: The oropharynx appears normal. His geographic tongue has resolved. He no longer has any maxillary teeth, but he is wearing his new dentures. His mouth is fairly dry.  Neck: The neck appears to be visibly normal. No carotid bruits are noted. The thyroid gland has shrunk back to normal at about 20 grams today. The consistency of the thyroid  gland is normal today. The thyroid gland is not tender to palpation. Lungs: The lungs are clear to auscultation. Air movement is good. Heart: Heart rate is rapid, but rhythm is regular. Heart sounds S1 and S2 are normal. I did not appreciate any pathologic cardiac murmurs. Abdomen: The abdomen is normal in size, but he has more adipose tissue today. Bowel sounds are normal. There is no obvious hepatomegaly, splenomegaly, or other mass effect.  Arms: Muscle size and bulk are normal for age. He has no new areas of excoriation on his arms. The older areas have healed.  Hands: There is no obvious tremor. Phalangeal and metacarpophalangeal joints are normal. Palmar muscles are normal. Palmar skin is normal. Palmar moisture is also normal. Nail beds are pallid. Legs: Muscles appear normal for age. No edema is present. Feet: Feet are normally formed. DP and PT pulses are trace. His left great toenail has healed from his prior stubbing injury.  Neurologic: Strength is normal for age in both the upper and lower extremities. Muscle tone is normal. Sensation to touch is normal in both the legs and feet.    LAB DATA:   Labs 09/13/19: CBG 406; urine glucose positive, urine ketones negative; He apparently did not take his AM insulin until later this afternoon.   Labs 09/10/19: HbA1c 9.3%  Labs 06/15/19: HbA1c 9.0%, CBG 196; TSH 1.98, free T4 1.2, free T3 3.4; CMP normal; CBC normal, cholesterol 137, triglycerides 96, HDL 46, LDL 73; urinary microalbumin/creatinine ratio 1,599  Labs 12/14/18: HbA1c 9.7%, CBG 304  Labs 08/01/18: HbA1c 8.9%, CBG 89  Labs 02/23/18: HbA1c 8.8%, CBG 201; TSH 5.00, free T4 1.1, free T3 3.2;   Labs 11/23/17: CBG 338  Labs 09/26/17: HbA1c 10.9%, CBG 364, urine glucose 2000, urine ketones negative  Labs 06/27/17: HbA1c 9.0%, CBG 134; TSH 1.49, free T4 1.2, free T3 3.1; CMP normal, except for glucose of 213; cholesterol 134, triglycerides 79, HDL 53, LDL 65; urinary  microalbumin/creatinine ratio was 748   Labs 12/31/15: HbA1c 11.7%, CBG 145  Labs 09/29/16: HbA1c 10.2%, CBG 70  Labs 06/01/16: HbA1c >14%. CBG 288; TSH 2.65, free T4 1.2, free T3 2.7; cholesterol 201, triglycerides 106, HDL 54, LDL 127; CMP normal except glucose 130 and albumin 3.5.   Labs 01/05/16: HbA1c 11.0%  Labs 11/04/15: HbA1c 10.5%  Labs 09/12/15: HbA1c 10.3%; CMP normal except for glucose 149 and sodium 133; cholesterol 190, triglycerides 90, HDL 55, LDL 117; urinary microalbumin/creatinine ratio 190; TSH 3.09, free T4 1.3, free T3 3.8  Labs 07/16/15: HbA1c 10.6%, Urine dipstick shows trace ketones  Labs 04/09/15: HbA1c 10.4%  Labs 09/09/14: HbA1c 11.9%; TSH 1.822, free T4 1.02, and free T3 2.6; CMP normal except for glucose of 273; cholesterol 174, triglycerides 181, HDL 46, LDL 92; urinary microalbumin/creatinine ratio 139.2  Labs 06/19/14: HbA1c 10.7%  Labs 05/12/14: CMP normal  Labs 02/19/14: HbA1c 10.2%  Labs 07/31/13: HbA1c 8.5%; CMP normal, except glucose 269; microalbumin/creatinine ratio 51.6; TSH 1.885, free T4 1.31, free T3 3.4   Labs 7/018/14: CMP normal, except glucose of 14; cholesterol 202, triglycerides 85, HDL 58, LDL 127; urinary microalbumin/creatinine ratio 51.9; TSH 2.993, free T4 1.32, free T3 2.8  05/10/12: Creatinine 0.62, TSH 1.025,            Assessment and Plan:  ASSESSMENT:  1-2. Diabetes mellitus/hypoglycemia:   A. The patient's HbA1c is higher today and his CGM readings are higher overall now. He is missing too many insulin doses.   B. He has been having some nocturnal hypoglycemia associated with either not checking BGs at bedtime or not taking adequate bedtime snacks.   [name] needs continuing adult supervision.  3. Hypertension: Blood pressure is higher today. He may have missed some lisinopril doses. He also has not been exercising.    4. Peripheral neuropathy: This problem has improved and is not evident today, but will certainly recur if  he does not get his BGs under better control.   5. Angiopathy: His angiopathy is still fairly severe. He needs to walk daily. If he exercises on a regular basis and controls his BGs, the blood flow in his legs and feet will likely improve. 6. Goiter/thyroiditis: Thyroid gland has shrunk back to normal size today. The process of waxing and waning of thyroid gland size is c/w evolving Hashimoto's thyroiditis.   7. Hypothyroid: He was mid-range euthyroid in April 2016, but borderline low in April 2017. His TSH in January 2018 was better, but still above the goal range of 1.0-2.0. His TSH in February 2019 was within the goal range. His TFTs in October 2019 were low, largely due to missing Synthroid doses. His TFTs in January 2021 were mid-euthyroid.  He needs to take his Synthroid daily.  8. Autonomic neuropathy, tachycardia, bloating, and gastroparesis: His heart rate is normal and he is not having any GI symptoms.   9. Combined hyperlipidemia: His lipids in February 2019 and again in January 2021 were normal on his current dose of atorvastatin.  10. Weight loss, unintentional: He has lost more weight, c/w underinsulinization.     11. Dehydration: He is mildly dehydrated today.  12. Medical neglect: [name]' sister, [name], and her husband have really been trying to supervise [name], although he often makes it difficult for them to do so. The chain of supervision broke down when he went to his paternal grandmother's home.   13. 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  14. Microalbuminuria/macroalbuminuria: His microalbumin/creatinine ratio in February 2019 was the highest that it has been in the prior four nears. His ratio in January 2021 was even higher. He needs better control of both his BGs and his BPs. He still needs to take his lisinopril every day.   15. Acute coronary syndrome: This event scared [name]. Since [name] does not have a PCP, I referred him to [facility] for further E&M. He  has not had a recurrence. He missed his cardiology appointment, so needs to re-schedule.. 16. Pallor of nail beds: His CBC in January 2021 was normal. His CBC on 09/10/19 had normal RBC indices.   PLAN:  1. Diagnostic: CBG today. Reviewed CGM printout and his annual surveillance lab results. Bring in CGM in four weeks for download. Call cardiology to re-schedule.  2. Therapeutic:   A. Continue  the Humalog Mix 75/25 insulin dose in the mornings of 28 units. Continue  the dinner insulin dose to 24 units. However, if the morning BG is <100, reduce the morning insulin dose to 26 units. If the dinner BG is <100, reduce the dinner insulin dose to 23 units. If the BG at bedtime is <200, follow the Small column bedtime snack plan.    B. Take oral medications once a day, at whatever time the family can fit in the doses.   C.  I asked that [name] directly supervise his BG checks, insulin dosing, and oral meds.  3. Patient education: We discussed all of the above. I asked [name] again to please cooperate with his family members. 4. Follow-up: 3 months. Ensure that [employer] and/or mother can attend.   Level of Service: This visit lasted in excess of 95 minutes. More than 50% of the visit was devoted to counseling.  Molli Knock, MD 08/14/2020 8:29 AM

## 2020-09-25 NOTE — Progress Notes (Addendum)
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  HISTORY OF PRESENT ILLNESS:   [name] is a 35 y.o. Caucasian young man.  [name] was accompanied by his sister, [name], and her little boy.       1. I first consulted on [name] when he was admitted to the Pediatric Ward at the [facility] in May 2007.  A. The patient was diagnosed with T1DM at age 45 in about 2001 or 2002. We have few details about his medical care until 2007. We know that in about 2006 he saw a pediatric endocrinologist at [facility], [facility], [facility]. Unfortunately, the family chose not to return to see the endocrinologist. In about April of 2007, his primary care provider, Dr. Reed Breech of [facility], started the patient on an insulin pump. Unfortunately, neither the patient nor the family were capable of utilizing the pump's technology effectively.   B. On 10/02/2005 the patient was admitted to the [facility] for poorly controlled type 1 diabetes. I consulted on the patient at that time. 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 He did not have the insight or intelligence to perform the daily tasks of diabetes self-care independently or to utilize the insulin pump. It also appeared that there was not a great deal of adult supervision and support within his family. We tried to teach him and his family how to follow a multiple daily injection of insulin plan with Lantus and Novolog insulin, but that attempt failed  rapidly. I changed his regimen to Novolog Mix 70/30 insulin taken twice daily. He was to take 26 units each morning at breakfast and 14 units each evening at supper. I also gave him a sliding scale for Humalog lispro with different doses of insulin to be used at breakfast, lunch, supper, and bedtime in addition to his 70/30 insulin when his family members were available to check the blood sugars and give the additional insulin. The patient's family members agreed to supervise his care.  2. Unfortunately, [name] has frequently not had the adult supervision and support that he needed. In the last 15 years, the patient has been scheduled to return to our clinic for follow up visits approximately every 3 months, but has actually only returned to our clinic for follow up visits 1-4 times per year, although the family has done much better in the past three years. In 2012 he had 3 visits and was admitted once. In 2013 he had one admission in December, followed by a clinic visit later that month. In 2014 he had 4 clinic visits. In 2015 he had two clinic visits. In fairness, however, I should note that I was unavailable to see him for two months in 2015 due to my severe leg injury and post-operative recuperation. He had 4 visits in 2016 and 4 visits in 2017, but in 2018 he only had 3 visits. In 2019 he had 4 visits. In 2020 he had 2 visit. In 2021 he had two visits. After moving in with his sister and her family in January 2019, the adult supervision and support in [place] life had substantially improved, but in the  last year have deteriorated.   3. During the past 15 years [name] has had multiple medical problems:  A. T1DM: His hemoglobin A1c values have been mostly in the 9.0-14.0% range. He has the following complications of diabetes: diabetic angiopathy, peripheral neuropathy, autonomic neuropathy, inappropriate sinus tachycardia, gastroparesis and postprandial abdominal bloating, chronic constipation, urinary  microalbuminuria, and occasional but significant hypoglycemia. Due to health insurance restrictions he has taken Novolog 70/30 Mix insulin at some times and Humalog 75/25 Mix insulin at other times.   B. Autoimmune hypothyroidism: In November 2007, the patient was diagnosed with hypothyroidism secondary to Hashimoto's disease and was started on Synthroid at a dose of 25 mcg per day.   C. Hypertension: In November 2008, he was diagnosed with hypertension and started on lisinopril, 5 mg per day.   D. Hypercholesterolemia: In July 2014 he was diagnosed with hypercholesterolemia and started on atorvastatin, 10 mg/day.  E. Noncompliance/medical neglect/inadequate medical supervision: Unfortunately, [name] has frequently missed BG checks, doses of insulin, and doses of his oral medications. Parental/family supervision and support have varied in consistency and in quality throughout these years. However, since [name] has been living with [name] and her family, the supervision and support have been much better.  [name] had an episode of chest pain and pallor on 11/05/18. He was admitted to [facility] where he was diagnosed with acute coronary syndrome. That episode scared him and caused him to try to be somewhat more compliant with his DM care plan.   G. On 09/09/19 he presented to the ED at [facility] with hyperglycemia, DKA, and dehydration. His CBG was 559. His serum glucose was 573, CO2 19, anion gap 19, BHOB 5.9% (ref 0.05-00.27), HbA1c 9.3%, urine glucose >500, urine ketones 80. He was treated with iv insulin per [facility]. He was discharged on 09/11/19. In retrospect, he had been visiting his grandmother in [place] for 3 days and had forgotten to bring his insulin with him. When he arrived home on 09/09/19 his mother recognized that he was sick and had severe ketonuria, so she gave him 29 units of 70/30 insulin and then immediately took him to the ED.   4. [name]' last [facility] clinic visit was on 09/13/2019.  At that visit we  continued his Humalog Mix 75/25 insulin doses of 28 units in the mornings and 24 units in the evenings.    A. In the interim he had been fairly healthy until 04/03/20 when he presented to the ED at [facility] with hyperglycemia, near syncope, and dehydration. His CBG was 559. His serum glucose was 341, CO2 29, anion gap 7, creatinine 1.44, urine glucose >500. He was treated with iv fluids and discharged to home.   B. On 05/11/20 he was positive for SARS-COV-2. He had previously been vaccinated on 09/24/19.  C. He missed 4 appointments in the past year. Two were cancelled and two were No Shows.  D. BG control has still been "up and down", mainly up.   E. He has not really been trying to eat better. He has had little physical activity during the winter months .   F. He continues on his morning 75/25 insulin dose of 28 units and his evening dose of 24 units.  He says he is not missing many insulin doses, but his printout suggests otherwise.    G. His dentures are working well.   Rise Paganini' sister, [name], has not been supervising his care in her home as much in the past year, but has been  relying on him to tell her whet he is and isn't doing and to take his oral medications and his insulins on his own. . That method has not been working.      I. He has both Medicaid and Medicare coverage, however, [place] Medicaid will no longer pay for medications. He receives his insulins and DM supplies through [facility].   J.  His skin lesions have mostly healed up, but he has a new bug bite on the ulnar aspect of his left hand.   I. He is supposed to be taking 25 mcg of Synthroid daily, 10 mg of lisinopril daily, and 10 mg of atorvastatin daily. He had stopped doing so, but re-started yesterday morning. [name] has not been supervising him as much in the past year, but will do so now.   5. Pertinent Review of Systems:  Constitutional: The patient feels "good overall", but still a little bit weak at times. However, when  he becomes hypoglycemic or extremely hyperglycemic, he can still be very irritable and sometimes even combative.  Eyes: Vision is about the same. He is legally blind. He had his annual eye exam in October or November 2019. He was told that his diabetes is not interfering with his eyes. He will have a follow up appointment in June.  Neck: The patient has no complaints of anterior neck swelling, soreness, tenderness, pressure, discomfort, or difficulty swallowing.  Heart: The patient has no other complaints of palpitations, irregular heat beats, chest pain, or chest pressure. Gastrointestinal: He does not usually have post-prandial bloating. He is no longer constipated. The patient has no other complaints of excessive hunger, acid reflux, stomach aches or pains, diarrhea, or constipation. Legs: He no longer has pains in the right upper thigh. Muscle mass and strength seem normal. There are no other complaints of numbness, tingling, burning, or pain. No edema is noted. Hands: No problems Feet: He says he no longer picks at his toe nails. There are no recent complaints of numbness, tingling, burning, or pain. No edema is noted. GU: He occasionally has nocturia. Hypoglycemia: He has not had many low BGs in the past month.    .    6. BG meter review: We have data from the past two weeks. He checks BGs 0-5 times daily, average 1.7 times. His average BG is 471, range 83-Hi. He had one BG <100, an 83. He had no BGs in the 100s. He had three BGs in the 200s. He had two BG in the 300s. He had one BG in the 400s. He had four BGs in the 500s. He had 11 BGs that were HI (>600). The BGs in the 500s and HIs indicate that he is often missing his insulin doses.  7. FreeStyle Libre download:   A. His Libre kept coming off, so he is no longer using it.   B. In April 2021, his average daily SGs had varied from 100 to 276 when he was at home. Some days he took his insulin twice daily, but sometimes only once. His SGs when  he was at his grandmother's home were all >400. He has had several SGS <70, but he couldn't identify a pattern.   PAST MEDICAL, FAMILY, AND SOCIAL HISTORY:  Past Medical History:  Diagnosis Date  . Angiopathy, diabetic (HCC)   . Diabetes mellitus    Type 1, diagnosed age 23, with frequent DKA admissions, difficult to control due to MR  . Diabetic nephropathy (HCC)  Started on Lisinopril 5 mg  . Diabetic peripheral neuropathy (HCC)   . Dysmorphic features   . Goiter   . Hypertension   . Hypoglycemia associated with diabetes (HCC)   . Hypothyroidism   . Impaired cognition    mention of mental retardation  . Mental retardation   . Moderate or severe vision impairment, both eyes, impairment level not further specified   . 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 Thyroiditis, autoimmune     Family History  Problem Relation Age of Onset  . Vision loss Father   . Retinitis pigmentosa Mother   . Diabetes Maternal Grandmother        AODM  . Diabetes Paternal Grandmother        AODM  . Diabetes Cousin        Second cousin has juvenile-onset DM.     Current Outpatient Medications:  .  aspirin EC 81 MG tablet, Take 1 tablet (81 mg total) by mouth daily., Disp: 90 tablet, Rfl: 3 .  Insulin Lispro Prot & Lispro (HUMALOG MIX 75/25 KWIKPEN) (75-25) 100 UNIT/ML Kwikpen, INJECT 28 UNITS OF INSULIN IN THE MORNING AND 24 UNITS IN THE EVENING, Disp: 15 mL, Rfl: 1 .  lisinopril (ZESTRIL) 10 MG tablet, TAKE 1 TABLET BY MOUTH EVERY DAY, Disp: 90 tablet, Rfl: 1 .  ONETOUCH VERIO test strip, USE TO CHECK BLOOD SUGAR 3 TIMES DAILY AS DIRECTED, Disp: 300 strip, Rfl: 1 .  SYNTHROID 25 MCG tablet, TAKE 1 TABLET BY MOUTH DAILY BEFORE BREAKFAST. (Patient taking differently: Take 25 mcg by mouth daily before breakfast.), Disp: 90 tablet, Rfl: 1 .  atorvastatin (LIPITOR) 10 MG tablet, TAKE 1 TABLET (10 MG TOTAL) BY MOUTH DAILY. (Patient not taking: Reported on 09/26/2020), Disp: 90 tablet,  Rfl: 0 .  Continuous Blood Gluc Sensor (FREESTYLE LIBRE 14 DAY SENSOR) MISC, Change every 14 days (Patient not taking: Reported on 09/26/2020), Disp: 3 each, Rfl: 5 .  ibuprofen (ADVIL) 200 MG tablet, Take 200 mg by mouth every 6 (six) hours as needed for fever or moderate pain (throat pain). (Patient not taking: Reported on 09/26/2020), Disp: , Rfl:  .  Insulin Pen Needle (PEN NEEDLES) 32G X 6 MM MISC, 1 application by Does not apply route daily as needed. Use to inject insulin 6 times daily. (Patient not taking: Reported on 09/26/2020), Disp: 200 each, Rfl: 5  Allergies as of 09/26/2020  . (No Known Allergies)    1. Work and Family: [name] OhmChris remains unemployed. He is still living with his sister, Nelma RothmanHeather Crymes, and her family in [place]. [name] stays at home and so can look after [name] Ohmhris. [name] OhmChris' friend, Gerlene BurdockRichard, also usually lives in the house. When they are together, [name] OhmChris and Gerlene BurdockRichard help each other out, but have not been exercising much in he Winter months. Mom has gone back to work. Mom is also trying to care for her mother. Mom's father died in October 2021.   2. Activities: [name] OhmChris has not been very active.  3. Smoking, alcohol, or drugs: No tobacco or alcohol or drugs.  4. Primary Care Provider: He can't remember his new doctor's name.  REVIEW OF SYSTEMS: There are no other significant problems involving Murice's other body systems.   Objective:  Vital Signs:  BP 130/80 (BP Location: Right Arm, Patient Position: Sitting, Cuff Size: Normal)   Pulse 74   Wt 130 lb 6.4 oz (59.1 kg)   BMI 22.38 kg/m    Ht Readings  from Last 3 Encounters:  04/03/20  (1.626 m)  09/18/19  (1.626 m)  09/10/19  (1.626 m)   Wt Readings from Last 3 Encounters:  09/26/20 130 lb 6.4 oz (59.1 kg)  09/18/19 146 lb 2 oz (66.3 kg)  09/13/19 142 lb 6.4 oz (64.6 kg)   Body surface area is 1.63 meters squared.  Facility age limit for growth percentiles is 20 years. Facility age limit for growth  percentiles is 20 years.  PHYSICAL EXAM:  Constitutional: The patient appears healthy, but his gait is still somewhat wide-based and shuffling due to his visual handicap. His weight has decreased by 16 pounds. He was fairly engaged and interactive at his level today. He still does not really remember much about checking his BGs and taking his insulins. His thought processes are still very concrete. His insight remains poor. He really does not understand or comprehend much about diabetes, about why he needs to take care of himself, and about what will happen to him if he does not take care of himself.   Face: The face is somewhat dysmorphic. His face is very similar to mom's face and [name]'s face. Eyes: There is no obvious arcus or proptosis. His eyes are slightly dry.  Mouth: The oropharynx appears normal. His geographic tongue has resolved. He no longer has any maxillary teeth, but he is wearing his new dentures. His mouth is fairly moist.    Neck: The neck appears to be visibly normal. No carotid bruits are noted. The thyroid gland has shrunk back to normal at about 20 grams today. The consistency of the thyroid gland is normal today. The thyroid gland is not tender to palpation. Lungs: The lungs are clear to auscultation. Air movement is good. Heart: Heart rate is rapid, but rhythm is regular. Heart sounds S1 and S2 are normal. I did not appreciate any pathologic cardiac murmurs. Abdomen: The abdomen is normal in size, but he has more adipose tissue today. Bowel sounds are normal. There is no obvious hepatomegaly, splenomegaly, or other mass effect.  Arms: Muscle size and bulk are low for age. He has no new areas of excoriation on his arms. The older areas have healed.  Hands: There is no obvious tremor. Phalangeal and metacarpophalangeal joints are normal. Palmar muscles are normal. Palmar skin is normal. Palmar moisture is also normal. Nail beds are pallid. He has the area of the excoriated bite  on his left hand. The site is clean.  Legs: Muscles appear normal for age. No edema is present. Feet: Feet are normally formed. DP and PT pulses are trace. His left great toenail has healed from his prior stubbing injury.  Neurologic: Strength is normal for age in both the upper and lower extremities. Muscle tone is normal. Sensation to touch is normal in both the legs and feet.    LAB DATA:   Labs 09/26/20: HbA1c <14%, CBG 364  Labs 09/13/19: CBG 406; urine glucose positive, urine ketones negative; He apparently did not take his AM insulin until later this afternoon.   Labs 09/10/19: HbA1c 9.3%  Labs 06/15/19: HbA1c 9.0%, CBG 196; TSH 1.98, free T4 1.2, free T3 3.4; CMP normal; CBC normal, cholesterol 137, triglycerides 96, HDL 46, LDL 73; urinary microalbumin/creatinine ratio 1,599  Labs 12/14/18: HbA1c 9.7%, CBG 304  Labs 08/01/18: HbA1c 8.9%, CBG 89  Labs 02/23/18: HbA1c 8.8%, CBG 201; TSH 5.00, free T4 1.1, free T3 3.2;   Labs 11/23/17: CBG 338  Labs 09/26/17: HbA1c  10.9%, CBG 364, urine glucose 2000, urine ketones negative  Labs 06/27/17: HbA1c 9.0%, CBG 134; TSH 1.49, free T4 1.2, free T3 3.1; CMP normal, except for glucose of 213; cholesterol 134, triglycerides 79, HDL 53, LDL 65; urinary microalbumin/creatinine ratio was 748   Labs 12/31/15: HbA1c 11.7%, CBG 145  Labs 09/29/16: HbA1c 10.2%, CBG 70  Labs 06/01/16: HbA1c >14%. CBG 288; TSH 2.65, free T4 1.2, free T3 2.7; cholesterol 201, triglycerides 106, HDL 54, LDL 127; CMP normal except glucose 130 and albumin 3.5.   Labs 01/05/16: HbA1c 11.0%  Labs 11/04/15: HbA1c 10.5%  Labs 09/12/15: HbA1c 10.3%; CMP normal except for glucose 149 and sodium 133; cholesterol 190, triglycerides 90, HDL 55, LDL 117; urinary microalbumin/creatinine ratio 190; TSH 3.09, free T4 1.3, free T3 3.8  Labs 07/16/15: HbA1c 10.6%, Urine dipstick shows trace ketones  Labs 04/09/15: HbA1c 10.4%  Labs 09/09/14: HbA1c 11.9%; TSH 1.822, free T4 1.02, and free  T3 2.6; CMP normal except for glucose of 273; cholesterol 174, triglycerides 181, HDL 46, LDL 92; urinary microalbumin/creatinine ratio 139.2  Labs 06/19/14: HbA1c 10.7%  Labs 05/12/14: CMP normal  Labs 02/19/14: HbA1c 10.2%  Labs 07/31/13: HbA1c 8.5%; CMP normal, except glucose 269; microalbumin/creatinine ratio 51.6; TSH 1.885, free T4 1.31, free T3 3.4   Labs 7/018/14: CMP normal, except glucose of 14; cholesterol 202, triglycerides 85, HDL 58, LDL 127; urinary microalbumin/creatinine ratio 51.9; TSH 2.993, free T4 1.32, free T3 2.8  05/10/12: Creatinine 0.62, TSH 1.025,            Assessment and Plan:   ASSESSMENT:  1-2. Diabetes mellitus/hypoglycemia:   A. The patient's HbA1c is so high that it can't be measured. His BGs are too high. He is missing too many insulin doses.   Di Kindle needs continuing adult supervision of his  BG checks and insulin doses. He is incapable of taking care of himself consistently. 3. Hypertension: Blood pressure is higher today. He may have missed some lisinopril doses. He also has not been exercising. He needs adult supervision of all of his oral medication.  4. Peripheral neuropathy: This problem has improved and is not evident today, but will certainly recur if he does not get his BGs under better control.   5. Angiopathy: His angiopathy is still fairly severe. He needs to walk daily. If he exercises on a regular basis and controls his BGs, the blood flow in his legs and feet will likely improve. 6. Goiter/thyroiditis: Thyroid gland has shrunk back to normal size today. The process of waxing and waning of thyroid gland size is c/w evolving Hashimoto's thyroiditis.   7. Hypothyroid: He was mid-range euthyroid in April 2016, but borderline low in April 2017. His TSH in January 2018 was better, but still above the goal range of 1.0-2.0. His TSH in February 2019 was within the goal range. His TFTs in October 2019 were low, largely due to missing Synthroid doses.  His TFTs in January 2021 were mid-euthyroid.  He needs to take his Synthroid daily. He needs adult supervision.  8. Autonomic neuropathy, tachycardia, bloating, and gastroparesis: His heart rate is normal and he is not having any GI symptoms.   9. Combined hyperlipidemia: His lipids in February 2019 and again in January 2021 were normal on his current dose of atorvastatin.  10. Weight loss, unintentional: He has lost more weight, c/w underinsulinization.     11. Dehydration: He is not dehydrated today.  12. Medical neglect: [name]' sister, [name], and her  husband had been trying to supervise [name] in the past, but have not done very well for months. [name] agrees to resume supervising his BG checks at breakfast and at dinner, his oral pills once daily, and his insulin doses at breakfast and dinner.   13. 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  14. Microalbuminuria/macroalbuminuria: His microalbumin/creatinine ratio in February 2019 was the highest that it has been in the prior four nears. His ratio in January 2021 was even higher. He needs better control of both his BGs and his BPs. He still needs to take his lisinopril every day.   15. Acute coronary syndrome: This event scared [name]. Since [name] does not have a PCP, I referred him to [facility] for further E&M. He has not had a recurrence. He missed his cardiology appointment, so needs to re-schedule.. 16. Pallor of nail beds: His CBC in January 2021 was normal. His CBC on 09/10/19 had normal RBC indices.   PLAN:  1. Diagnostic: HbA1c and CBG today. Reviewed CGM printout and his annual surveillance lab results. Call me in one week with the BG numbers.  2. Therapeutic:   A. Continue  the Humalog Mix 75/25 insulin dose in the mornings of 28 units. Continue  the dinner insulin dose to 24 units. However, if the morning BG is <100, reduce the morning insulin dose to 26 units. If the dinner BG is <100, reduce the dinner insulin dose to  23 units. If the BG at bedtime is <200, follow the Small column bedtime snack plan.    B. Take oral medications once a day, at whatever time the family can fit in the doses.   C. I asked that [name] directly supervise his BG checks, insulin dosing, and oral meds.  3. Patient education: We discussed all of the above. I asked [name] again to please cooperate with his family members. 4. Follow-up: 2 months. Ensure that [employer] and/or mother can attend.   Level of Service: This visit lasted in excess of 55 minutes. More than 50% of the visit was devoted to counseling.  Molli Knock, MD 09/26/2020 11:33 AM   Addendum 09/30/20: This note was completed on 09/26/20, but could not be signed until today due to a POC lab test performed that day not being finally resulted until 09/30/20.  Molli Knock, MD, CDE

## 2020-09-26 ENCOUNTER — Ambulatory Visit (INDEPENDENT_AMBULATORY_CARE_PROVIDER_SITE_OTHER): Payer: Medicare Other | Admitting: "Endocrinology

## 2020-09-26 ENCOUNTER — Encounter (INDEPENDENT_AMBULATORY_CARE_PROVIDER_SITE_OTHER): Payer: Self-pay | Admitting: "Endocrinology

## 2020-09-26 ENCOUNTER — Other Ambulatory Visit: Payer: Self-pay

## 2020-09-26 VITALS — BP 130/80 | HR 74 | Wt 130.4 lb

## 2020-09-26 DIAGNOSIS — E1043 Type 1 diabetes mellitus with diabetic autonomic (poly)neuropathy: Secondary | ICD-10-CM

## 2020-09-26 DIAGNOSIS — E1065 Type 1 diabetes mellitus with hyperglycemia: Secondary | ICD-10-CM | POA: Diagnosis not present

## 2020-09-26 DIAGNOSIS — E860 Dehydration: Secondary | ICD-10-CM

## 2020-09-26 DIAGNOSIS — K141 Geographic tongue: Secondary | ICD-10-CM

## 2020-09-26 DIAGNOSIS — R634 Abnormal weight loss: Secondary | ICD-10-CM

## 2020-09-26 DIAGNOSIS — E1151 Type 2 diabetes mellitus with diabetic peripheral angiopathy without gangrene: Secondary | ICD-10-CM

## 2020-09-26 DIAGNOSIS — E063 Autoimmune thyroiditis: Secondary | ICD-10-CM | POA: Diagnosis not present

## 2020-09-26 DIAGNOSIS — E049 Nontoxic goiter, unspecified: Secondary | ICD-10-CM

## 2020-09-26 DIAGNOSIS — Z91199 Patient's noncompliance with other medical treatment and regimen due to unspecified reason: Secondary | ICD-10-CM

## 2020-09-26 DIAGNOSIS — Z620 Inadequate parental supervision and control: Secondary | ICD-10-CM

## 2020-09-26 DIAGNOSIS — E1142 Type 2 diabetes mellitus with diabetic polyneuropathy: Secondary | ICD-10-CM

## 2020-09-26 DIAGNOSIS — E10649 Type 1 diabetes mellitus with hypoglycemia without coma: Secondary | ICD-10-CM

## 2020-09-26 DIAGNOSIS — Z9119 Patient's noncompliance with other medical treatment and regimen: Secondary | ICD-10-CM | POA: Diagnosis not present

## 2020-09-26 LAB — POCT GLYCOSYLATED HEMOGLOBIN (HGB A1C): HbA1c POC (<> result, manual entry): >14 % (ref 4.0–5.6)

## 2020-09-26 LAB — POCT GLUCOSE (DEVICE FOR HOME USE): POC Glucose: 364 mg/dl — AB (ref 70–99)

## 2020-09-26 NOTE — Patient Instructions (Signed)
Follow up visit in 2 months. Call Wednesday with an update of his BGs.

## 2020-09-30 LAB — POCT URINALYSIS DIPSTICK: Ketones, UA: NEGATIVE

## 2020-10-12 ENCOUNTER — Other Ambulatory Visit (INDEPENDENT_AMBULATORY_CARE_PROVIDER_SITE_OTHER): Payer: Self-pay | Admitting: "Endocrinology

## 2020-10-12 DIAGNOSIS — E1065 Type 1 diabetes mellitus with hyperglycemia: Secondary | ICD-10-CM

## 2020-12-03 ENCOUNTER — Ambulatory Visit (INDEPENDENT_AMBULATORY_CARE_PROVIDER_SITE_OTHER): Payer: Medicaid Other | Admitting: "Endocrinology

## 2020-12-03 NOTE — Progress Notes (Deleted)
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  HISTORY OF PRESENT ILLNESS:   [name] is a 35 y.o. Caucasian young man.  [name] was accompanied by his sister, [name], and her little boy.       1. I first consulted on [name] when he was admitted to the Pediatric Ward at the [facility] in May 2007.  A. The patient was diagnosed with T1DM at age 45 in about 2001 or 2002. We have few details about his medical care until 2007. We know that in about 2006 he saw a pediatric endocrinologist at [facility], [facility], [facility]. Unfortunately, the family chose not to return to see the endocrinologist. In about April of 2007, his primary care provider, Dr. Reed [name] of [facility], started the patient on an insulin pump. Unfortunately, neither the patient nor the family were capable of utilizing the pump's technology effectively.   B. On 10/02/2005 the patient was admitted to the [facility] for poorly controlled type 1 diabetes. I consulted on the patient at that time. 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 He did not have the insight or intelligence to perform the daily tasks of diabetes self-care independently or to utilize the insulin pump. It also appeared that there was not a great deal of adult supervision and support within his family. We tried to teach him and his family how to follow a multiple daily injection of insulin plan with Lantus and Novolog insulin, but that attempt failed  rapidly. I changed his regimen to Novolog Mix 70/30 insulin taken twice daily. He was to take 26 units each morning at breakfast and 14 units each evening at supper. I also gave him a sliding scale for Humalog lispro with different doses of insulin to be used at breakfast, lunch, supper, and bedtime in addition to his 70/30 insulin when his family members were available to check the blood sugars and give the additional insulin. The patient's family members agreed to supervise his care.  2. Unfortunately, [name] has frequently not had the adult supervision and support that he needed. In the last 15 years, the patient has been scheduled to return to our clinic for follow up visits approximately every 3 months, but has actually only returned to our clinic for follow up visits 1-4 times per year, although the family has done much better in the past three years. In 2012 he had 3 visits and was admitted once. In 2013 he had one admission in December, followed by a clinic visit later that month. In 2014 he had 4 clinic visits. In 2015 he had two clinic visits. In fairness, however, I should note that I was unavailable to see him for two months in 2015 due to my severe leg injury and post-operative recuperation. He had 4 visits in 2016 and 4 visits in 2017, but in 2018 he only had 3 visits. In 2019 he had 4 visits. In 2020 he had 2 visit. In 2021 he had two visits. After moving in with his sister and her family in January 2019, the adult supervision and support in [place] life had substantially improved, but in the  last year have deteriorated.   3. During the past 15 years [name] has had multiple medical problems:  A. T1DM: His hemoglobin A1c values have been mostly in the 9.0-14.0% range. He has the following complications of diabetes: diabetic angiopathy, peripheral neuropathy, autonomic neuropathy, inappropriate sinus tachycardia, gastroparesis and postprandial abdominal bloating, chronic constipation, urinary  microalbuminuria, and occasional but significant hypoglycemia. Due to health insurance restrictions he has taken Novolog 70/30 Mix insulin at some times and Humalog 75/25 Mix insulin at other times.   B. Autoimmune hypothyroidism: In November 2007, the patient was diagnosed with hypothyroidism secondary to Hashimoto's disease and was started on Synthroid at a dose of 25 mcg per day.   C. Hypertension: In November 2008, he was diagnosed with hypertension and started on lisinopril, 5 mg per day.   D. Hypercholesterolemia: In July 2014 he was diagnosed with hypercholesterolemia and started on atorvastatin, 10 mg/day.  E. Noncompliance/medical neglect/inadequate medical supervision: Unfortunately, [name] has frequently missed BG checks, doses of insulin, and doses of his oral medications. Parental/family supervision and support have varied in consistency and in quality throughout these years. However, since [name] has been living with [name] SetaHeather and her family, the supervision and support have been much better.  [name] MeekerF. [name] had an episode of chest pain and pallor on 11/05/18. He was admitted to [facility] where he was diagnosed with acute coronary syndrome. That episode scared him and caused him to try to be somewhat more compliant with his DM care plan.   G. On 09/09/19 he presented to the ED at [facility] with hyperglycemia, DKA, and dehydration. His CBG was 559. His serum glucose was 573, CO2 19, anion gap 19, BHOB 5.9% (ref 0.05-00.27), HbA1c 9.3%, urine glucose >500, urine ketones 80. He was treated with iv insulin per [facility]. He was discharged on 09/11/19. In retrospect, he had been visiting his grandmother in [place] for 3 days and had forgotten to bring his insulin with him. When he arrived home on 09/09/19 his mother recognized that he was sick and had severe ketonuria, so she gave him 29 units of 70/30 insulin and then immediately took him to the ED.   H. On 04/03/20 he presented to the ED at [facility] with hyperglycemia, near  syncope, and dehydration. His CBG was 559. His serum glucose was 341, CO2 29, anion gap 7, creatinine 1.44, urine glucose >500. He was treated with iv fluids and discharged to home.   I. On 05/11/20 he was positive for SARS-COV-2. He had previously been vaccinated on 09/24/19.  4. [name]' last [facility] clinic visit was on 09/26/2020.  At that visit we continued his Humalog Mix 75/25 insulin doses of 28 units in the mornings and 24 units in the evenings.    A. In the interim he had been fairly healthy until   B.   C. He missed 4 appointments in the past year. Two were cancelled and two were No Shows.  D. BG control has still been "up and down", mainly up.   E. He has not really been trying to eat better. He has had little physical activity during the winter months .   F. He continues on his morning 75/25 insulin dose of 28 units and his evening dose of 24 units.  He says he is not missing many insulin doses, but his printout suggests otherwise.    G. His dentures are working well.   Rise PaganiniH. [name]' sister, [name] SetaHeather, has not been supervising his care in her home as much in  the past year, but has been relying on him to tell her whet he is and isn't doing and to take his oral medications and his insulins on his own. . That method has not been working.      I. He has both Medicaid and Medicare coverage, however, [place] Medicaid will no longer pay for medications. He receives his insulins and DM supplies through [facility]   J.  His skin lesions have mostly healed up, but he has a new bug bite on the ulnar aspect of his left hand.   I. He is supposed to be taking 25 mcg of Synthroid daily, 10 mg of lisinopril daily, and 10 mg of atorvastatin daily. He had stopped doing so, but re-started yesterday morning. [name] has not been supervising him as much in the past year, but will do so now.   5. Pertinent Review of Systems:  Constitutional: The patient feels "good overall", but still a little bit weak at times.  However, when he becomes hypoglycemic or extremely hyperglycemic, he can still be very irritable and sometimes even combative.  Eyes: Vision is about the same. He is legally blind. He had his annual eye exam in October or November 2019. He was told that his diabetes is not interfering with his eyes. He will have a follow up appointment in June.  Neck: The patient has no complaints of anterior neck swelling, soreness, tenderness, pressure, discomfort, or difficulty swallowing.  Heart: The patient has no other complaints of palpitations, irregular heat beats, chest pain, or chest pressure. Gastrointestinal: He does not usually have post-prandial bloating. He is no longer constipated. The patient has no other complaints of excessive hunger, acid reflux, stomach aches or pains, diarrhea, or constipation. Legs: He no longer has pains in the right upper thigh. Muscle mass and strength seem normal. There are no other complaints of numbness, tingling, burning, or pain. No edema is noted. Hands: No problems Feet: He says he no longer picks at his toe nails. There are no recent complaints of numbness, tingling, burning, or pain. No edema is noted. GU: He occasionally has nocturia. Hypoglycemia: He has not had many low BGs in the past month.    .    6. BG meter review: We have data from the past two weeks. He checks BGs 0-5 times daily, average 1.7 times. His average BG is 471, range 83-Hi. He had one BG <100, an 83. He had no BGs in the 100s. He had three BGs in the 200s. He had two BG in the 300s. He had one BG in the 400s. He had four BGs in the 500s. He had 11 BGs that were HI (>600). The BGs in the 500s and HIs indicate that he is often missing his insulin doses.  7. FreeStyle Libre download:   A. His Libre kept coming off, so he is no longer using it.   B. In April 2021, his average daily SGs had varied from 100 to 276 when he was at home. Some days he took his insulin twice daily, but sometimes only once.  His SGs when he was at his grandmother's home were all >400. He has had several SGS <70, but he couldn't identify a pattern.   PAST MEDICAL, FAMILY, AND SOCIAL HISTORY:  Past Medical History:  Diagnosis Date   Angiopathy, diabetic (HCC)    Diabetes mellitus    Type 1, diagnosed age 42, with frequent DKA admissions, difficult to control due to MR  Diabetic nephropathy (HCC)    Started on Lisinopril 5 mg   Diabetic peripheral neuropathy (HCC)    Dysmorphic features    Goiter    Hypertension    Hypoglycemia associated with diabetes (HCC)    Hypothyroidism    Impaired cognition    mention of mental retardation   Mental retardation    Moderate or severe vision impairment, both eyes, impairment level not further specified    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     Current Outpatient Medications:    aspirin EC 81 MG tablet, Take 1 tablet (81 mg total) by mouth daily., Disp: 90 tablet, Rfl: 3   atorvastatin (LIPITOR) 10 MG tablet, TAKE 1 TABLET (10 MG TOTAL) BY MOUTH DAILY. (Patient not taking: Reported on 09/26/2020), Disp: 90 tablet, Rfl: 0   Continuous Blood Gluc Sensor (FREESTYLE LIBRE 14 DAY SENSOR) MISC, Change every 14 days (Patient not taking: Reported on 09/26/2020), Disp: 3 each, Rfl: 5   ibuprofen (ADVIL) 200 MG tablet, Take 200 mg by mouth every 6 (six) hours as needed for fever or moderate pain (throat pain). (Patient not taking: Reported on 09/26/2020), Disp: , Rfl:    Insulin Lispro Prot & Lispro (HUMALOG MIX 75/25 KWIKPEN) (75-25) 100 UNIT/ML Kwikpen, INJECT 28 UNITS INTO THE SKIN IN THE MORNING AND 24 UNITS IN THE EVENING, Disp: 15 mL, Rfl: 4   Insulin Pen Needle (PEN NEEDLES)  32G X 6 MM MISC, 1 application by Does not apply route daily as needed. Use to inject insulin 6 times daily. (Patient not taking: Reported on 09/26/2020), Disp: 200 each, Rfl: 5   lisinopril (ZESTRIL) 10 MG tablet, TAKE 1 TABLET BY MOUTH EVERY DAY, Disp: 90 tablet, Rfl: 1   ONETOUCH VERIO test strip, USE TO CHECK BLOOD SUGAR 3 TIMES DAILY AS DIRECTED, Disp: 300 strip, Rfl: 1   SYNTHROID 25 MCG tablet, TAKE 1 TABLET BY MOUTH DAILY BEFORE BREAKFAST. (Patient taking differently: Take 25 mcg by mouth daily before breakfast.), Disp: 90 tablet, Rfl: 1  Allergies as of 12/03/2020   (No Known Allergies)    1. Work and Family: [name] remains unemployed. He is still living with his sister, [name], and her family in [place]. [name] stays at home and so can look after [name]. [name]' friend, [name], also usually lives in the house. When they are together, [name] and [name] help each other out, but have not been exercising much in he Winter months. Mom has gone back to work. Mom is also trying to care for her mother. Mom's father died in 12-Mar-2020.   2. Activities: [name] has not been very active.  3. Smoking, alcohol, or drugs: No tobacco or alcohol or drugs.  4. Primary Care Provider: He can't remember his new doctor's name.  REVIEW OF SYSTEMS: There are no other significant problems involving [name]'s other body systems.   Objective:  Vital Signs:  There were no vitals taken for this visit.   Ht Readings from Last 3 Encounters:  04/03/20 5\' 4"  (1.626 m)  09/18/19 5\' 4"  (1.626 m)  09/10/19  (1.626 m)   Wt Readings from Last 3 Encounters:  09/26/20 130 lb 6.4 oz (59.1 kg)  09/18/19 146 lb 2 oz (66.3 kg)  09/13/19 142 lb 6.4 oz (64.6 kg)   There is no height or weight on file to calculate BSA.  Facility age limit for growth percentiles is 20 years. Facility age limit for growth percentiles is 20 years.  PHYSICAL EXAM:  Constitutional: The patient appears healthy, but his  gait is still somewhat wide-based and shuffling due to his visual handicap. His weight has decreased by 16 pounds. He was fairly engaged and interactive at his level today. He still does not really remember much about checking his BGs and taking his insulins. His thought processes are still very concrete. His insight remains poor. He really does not understand or comprehend much about diabetes, about why he needs to take care of himself, and about what will happen to him if he does not take care of himself.   Face: The face is somewhat dysmorphic. His face is very similar to mom's face and [name]'s face. Eyes: There is no obvious arcus or proptosis. His eyes are slightly dry.  Mouth: The oropharynx appears normal. His geographic tongue has resolved. He no longer has any maxillary teeth, but he is wearing his new dentures. His mouth is fairly moist.    Neck: The neck appears to be visibly normal. No carotid bruits are noted. The thyroid gland has shrunk back to normal at about 20 grams today. The consistency of the thyroid gland is normal today. The thyroid gland is not tender to palpation. Lungs: The lungs are clear to auscultation. Air movement is good. Heart: Heart rate is rapid, but rhythm is regular. Heart sounds S1 and S2 are normal. I did not appreciate any pathologic cardiac murmurs. Abdomen: The abdomen is normal in size, but he has more adipose tissue today. Bowel sounds are normal. There is no obvious hepatomegaly, splenomegaly, or other mass effect.  Arms: Muscle size and bulk are low for age. He has no new areas of excoriation on his arms. The older areas have healed.  Hands: There is no obvious tremor. Phalangeal and metacarpophalangeal joints are normal. Palmar muscles are normal. Palmar skin is normal. Palmar moisture is also normal. Nail beds are pallid. He has the area of the excoriated bite on his left hand. The site is clean.  Legs: Muscles appear normal for age. No edema is  present. Feet: Feet are normally formed. DP and PT pulses are trace. His left great toenail has healed from his prior stubbing injury.  Neurologic: Strength is normal for age in both the upper and lower extremities. Muscle tone is normal. Sensation to touch is normal in both the legs and feet.    LAB DATA:   Labs 09/26/20: HbA1c <14%, CBG 364  Labs 09/13/19: CBG 406; urine glucose positive, urine ketones negative; He apparently did not take his AM insulin until later this afternoon.   Labs 09/10/19: HbA1c 9.3%  Labs 06/15/19: HbA1c 9.0%, CBG 196; TSH 1.98, free T4 1.2, free T3 3.4; CMP normal; CBC normal, cholesterol 137, triglycerides 96, HDL 46, LDL 73; urinary microalbumin/creatinine ratio 1,599  Labs 12/14/18: HbA1c 9.7%, CBG 304  Labs 08/01/18: HbA1c 8.9%, CBG 89  Labs 02/23/18: HbA1c 8.8%, CBG 201; TSH 5.00, free T4 1.1, free T3 3.2;   Labs 11/23/17: CBG 338  Labs 09/26/17: HbA1c 10.9%, CBG 364, urine glucose 2000, urine ketones negative  Labs 06/27/17: HbA1c  9.0%, CBG 134; TSH 1.49, free T4 1.2, free T3 3.1; CMP normal, except for glucose of 213; cholesterol 134, triglycerides 79, HDL 53, LDL 65; urinary microalbumin/creatinine ratio was 748   Labs 12/31/15: HbA1c 11.7%, CBG 145  Labs 09/29/16: HbA1c 10.2%, CBG 70  Labs 06/01/16: HbA1c >14%. CBG 288; TSH 2.65, free T4 1.2, free T3 2.7; cholesterol 201, triglycerides 106, HDL 54, LDL 127; CMP normal except glucose 130 and albumin 3.5.   Labs 01/05/16: HbA1c 11.0%  Labs 11/04/15: HbA1c 10.5%  Labs 09/12/15: HbA1c 10.3%; CMP normal except for glucose 149 and sodium 133; cholesterol 190, triglycerides 90, HDL 55, LDL 117; urinary microalbumin/creatinine ratio 190; TSH 3.09, free T4 1.3, free T3 3.8  Labs 07/16/15: HbA1c 10.6%, Urine dipstick shows trace ketones  Labs 04/09/15: HbA1c 10.4%  Labs 09/09/14: HbA1c 11.9%; TSH 1.822, free T4 1.02, and free T3 2.6; CMP normal except for glucose of 273; cholesterol 174, triglycerides 181, HDL 46,  LDL 92; urinary microalbumin/creatinine ratio 139.2  Labs 06/19/14: HbA1c 10.7%  Labs 05/12/14: CMP normal  Labs 02/19/14: HbA1c 10.2%  Labs 07/31/13: HbA1c 8.5%; CMP normal, except glucose 269; microalbumin/creatinine ratio 51.6; TSH 1.885, free T4 1.31, free T3 3.4   Labs 7/018/14: CMP normal, except glucose of 14; cholesterol 202, triglycerides 85, HDL 58, LDL 127; urinary microalbumin/creatinine ratio 51.9; TSH 2.993, free T4 1.32, free T3 2.8  05/10/12: Creatinine 0.62, TSH 1.025,            Assessment and Plan:   ASSESSMENT:  1-2. Diabetes mellitus/hypoglycemia:   A. The patient's HbA1c is so high that it can't be measured. His BGs are too high. He is missing too many insulin doses.   Di Kindle needs continuing adult supervision of his  BG checks and insulin doses. He is incapable of taking care of himself consistently. 3. Hypertension: Blood pressure is higher today. He may have missed some lisinopril doses. He also has not been exercising. He needs adult supervision of all of his oral medication.  4. Peripheral neuropathy: This problem has improved and is not evident today, but will certainly recur if he does not get his BGs under better control.   5. Angiopathy: His angiopathy is still fairly severe. He needs to walk daily. If he exercises on a regular basis and controls his BGs, the blood flow in his legs and feet will likely improve. 6. Goiter/thyroiditis: Thyroid gland has shrunk back to normal size today. The process of waxing and waning of thyroid gland size is c/w evolving Hashimoto's thyroiditis.   7. Hypothyroid: He was mid-range euthyroid in April 2016, but borderline low in April 2017. His TSH in January 2018 was better, but still above the goal range of 1.0-2.0. His TSH in February 2019 was within the goal range. His TFTs in October 2019 were low, largely due to missing Synthroid doses. His TFTs in January 2021 were mid-euthyroid.  He needs to take his Synthroid daily. He  needs adult supervision.  8. Autonomic neuropathy, tachycardia, bloating, and gastroparesis: His heart rate is normal and he is not having any GI symptoms.   9. Combined hyperlipidemia: His lipids in February 2019 and again in January 2021 were normal on his current dose of atorvastatin.  10. Weight loss, unintentional: He has lost more weight, c/w underinsulinization.     11. Dehydration: He is not dehydrated today.  12. Medical neglect: [name]' sister, [name], and her husband had been trying to supervise [name] in the past, but have not  done very well for months. [name] agrees to resume supervising his BG checks at breakfast and at dinner, his oral pills once daily, and his insulin doses at breakfast and dinner.   13. 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  14. Microalbuminuria/macroalbuminuria: His microalbumin/creatinine ratio in February 2019 was the highest that it has been in the prior four nears. His ratio in January 2021 was even higher. He needs better control of both his BGs and his BPs. He still needs to take his lisinopril every day.   15. Acute coronary syndrome: This event scared [name]. Since [name] does not have a PCP, I referred him to [facility] for further E&M. He has not had a recurrence. He missed his cardiology appointment, so needs to re-schedule.. 16. Pallor of nail beds: His CBC in January 2021 was normal. His CBC on 09/10/19 had normal RBC indices.   PLAN:  1. Diagnostic: HbA1c and CBG today. Reviewed CGM printout and his annual surveillance lab results. Call me in one week with the BG numbers.  2. Therapeutic:   A. Continue  the Humalog Mix 75/25 insulin dose in the mornings of 28 units. Continue  the dinner insulin dose to 24 units. However, if the morning BG is <100, reduce the morning insulin dose to 26 units. If the dinner BG is <100, reduce the dinner insulin dose to 23 units. If the BG at bedtime is <200, follow the Small column bedtime snack plan.     B. Take oral medications once a day, at whatever time the family can fit in the doses.   C. I asked that [name] directly supervise his BG checks, insulin dosing, and oral meds.  3. Patient education: We discussed all of the above. I asked [name] again to please cooperate with his family members. 4. Follow-up: 2 months. Ensure that [employer] and/or mother can attend.   Level of Service: This visit lasted in excess of 55 minutes. More than 50% of the visit was devoted to counseling.  Molli Knock, MD 12/03/2020 9:08 AM   Addendum 09/30/20: This note was completed on 09/26/20, but could not be signed until today due to a POC lab test performed that day not being finally resulted until 09/30/20.  Molli Knock, MD, CDE

## 2020-12-14 ENCOUNTER — Other Ambulatory Visit (INDEPENDENT_AMBULATORY_CARE_PROVIDER_SITE_OTHER): Payer: Self-pay | Admitting: "Endocrinology

## 2020-12-14 DIAGNOSIS — E063 Autoimmune thyroiditis: Secondary | ICD-10-CM

## 2020-12-17 ENCOUNTER — Other Ambulatory Visit (INDEPENDENT_AMBULATORY_CARE_PROVIDER_SITE_OTHER): Payer: Self-pay | Admitting: "Endocrinology

## 2020-12-17 ENCOUNTER — Telehealth (INDEPENDENT_AMBULATORY_CARE_PROVIDER_SITE_OTHER): Payer: Self-pay | Admitting: "Endocrinology

## 2020-12-17 DIAGNOSIS — E063 Autoimmune thyroiditis: Secondary | ICD-10-CM

## 2020-12-17 NOTE — Telephone Encounter (Signed)
Who's calling (name and relationship to patient) : [name] mom   Best contact number: [phone]  Provider they see: Dr. Fransico Michael  Reason for call:   Call ID:      PRESCRIPTION REFILL ONLY  Name of prescription: Atorvastatin Synthroid Lansets Lisinopril Test strips  Pharmacy: [facility] [place]

## 2020-12-18 NOTE — Progress Notes (Deleted)
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  HISTORY OF PRESENT ILLNESS:   [name] is a 35 y.o. Caucasian young man.  [name] was accompanied by his sister, [name], and her little boy.       1. I first consulted on [name] when he was admitted to the Pediatric Ward at the [facility] in May 2007.  A. The patient was diagnosed with T1DM at age 45 in about 2001 or 2002. We have few details about his medical care until 2007. We know that in about 2006 he saw a pediatric endocrinologist at [facility], [facility], [facility]. Unfortunately, the family chose not to return to see the endocrinologist. In about April of 2007, his primary care provider, Dr. Reed [name] of [facility], started the patient on an insulin pump. Unfortunately, neither the patient nor the family were capable of utilizing the pump's technology effectively.   B. On 10/02/2005 the patient was admitted to the [facility] for poorly controlled type 1 diabetes. I consulted on the patient at that time. 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 He did not have the insight or intelligence to perform the daily tasks of diabetes self-care independently or to utilize the insulin pump. It also appeared that there was not a great deal of adult supervision and support within his family. We tried to teach him and his family how to follow a multiple daily injection of insulin plan with Lantus and Novolog insulin, but that attempt failed  rapidly. I changed his regimen to Novolog Mix 70/30 insulin taken twice daily. He was to take 26 units each morning at breakfast and 14 units each evening at supper. I also gave him a sliding scale for Humalog lispro with different doses of insulin to be used at breakfast, lunch, supper, and bedtime in addition to his 70/30 insulin when his family members were available to check the blood sugars and give the additional insulin. The patient's family members agreed to supervise his care.  2. Unfortunately, [name] has frequently not had the adult supervision and support that he needed. In the last 15 years, the patient has been scheduled to return to our clinic for follow up visits approximately every 3 months, but has actually only returned to our clinic for follow up visits 1-4 times per year, although the family has done much better in the past three years. In 2012 he had 3 visits and was admitted once. In 2013 he had one admission in December, followed by a clinic visit later that month. In 2014 he had 4 clinic visits. In 2015 he had two clinic visits. In fairness, however, I should note that I was unavailable to see him for two months in 2015 due to my severe leg injury and post-operative recuperation. He had 4 visits in 2016 and 4 visits in 2017, but in 2018 he only had 3 visits. In 2019 he had 4 visits. In 2020 he had 2 visit. In 2021 he had two visits. After moving in with his sister and her family in January 2019, the adult supervision and support in [place] life had substantially improved, but in the  last year have deteriorated.   3. During the past 15 years [name] has had multiple medical problems:  A. T1DM: His hemoglobin A1c values have been mostly in the 9.0-14.0% range. He has the following complications of diabetes: diabetic angiopathy, peripheral neuropathy, autonomic neuropathy, inappropriate sinus tachycardia, gastroparesis and postprandial abdominal bloating, chronic constipation, urinary  microalbuminuria, and occasional but significant hypoglycemia. Due to health insurance restrictions he has taken Novolog 70/30 Mix insulin at some times and Humalog 75/25 Mix insulin at other times.   B. Autoimmune hypothyroidism: In November 2007, the patient was diagnosed with hypothyroidism secondary to Hashimoto's disease and was started on Synthroid at a dose of 25 mcg per day.   C. Hypertension: In November 2008, he was diagnosed with hypertension and started on lisinopril, 5 mg per day.   D. Hypercholesterolemia: In July 2014 he was diagnosed with hypercholesterolemia and started on atorvastatin, 10 mg/day.  E. Noncompliance/medical neglect/inadequate medical supervision: Unfortunately, [name] has frequently missed BG checks, doses of insulin, and doses of his oral medications. Parental/family supervision and support have varied in consistency and in quality throughout these years. However, since [name] has been living with [name] and her family, the supervision and support have been much better.  [name] MeekerF. [name] had an episode of chest pain and pallor on 11/05/18. He was admitted to [facility] where he was diagnosed with acute coronary syndrome. That episode scared him and caused him to try to be somewhat more compliant with his DM care plan.   G. On 09/09/19 he presented [facility] ED at [facility] with hyperglycemia, DKA, and dehydration. His CBG was 559. His serum glucose was 573, CO2 19, anion gap 19, BHOB 5.9% (ref 0.05-00.27), HbA1c 9.3%, urine glucose >500, urine ketones 80. He was treated with iv insulin per [facility]. He was discharged on 09/11/19. In retrospect, he had been visiting his grandmother in [place] for 3 days and had forgotten to bring his insulin with him. When he arrived home on 09/09/19 his mother recognized that he was sick and had severe ketonuria, so she gave him 29 units of 70/30 insulin and then immediately took him to the ED.   H. On 04/03/20 he [facility] with hyperglycemia, near  syncope, and dehydration. His CBG was 559. His serum glucose was 341, CO2 29, anion gap 7, creatinine 1.44, urine glucose >500. He was treated with iv fluids and discharged to home.   I. On 05/11/20 he was positive for SARS-COV-2. He had previously been vaccinated on 09/24/19.  4. [name]' last [facility] clinic visit was on 09/26/2020.  At that visit we continued his Humalog Mix 75/25 insulin doses of 28 units in the mornings and 24 units in the evenings.    A. In the interim he had been fairly healthy until   B.   C. He missed 4 appointments in the past year. Two were cancelled and two were No Shows.  D. BG control has still been "up and down", mainly up.   E. He has not really been trying to eat better. He has had little physical activity during the winter months .   F. He continues on his morning 75/25 insulin dose of 28 units and his evening dose of 24 units.  He says he is not missing many insulin doses, but his printout suggests otherwise.    G. His dentures are working well.   Rise PaganiniH. [name]' sister, [name], has not been supervising his care in her home as much in  the past year, but has been relying on him to tell her whet he is and isn't doing and to take his oral medications and his insulins on his own. . That method has not been working.      I. He has both Medicaid and Medicare coverage, however, [place] Medicaid will no longer pay for medications. He receives his insulins and DM supplies through [facility]   J.  His skin lesions have mostly healed up, but he has a new bug bite on the ulnar aspect of his left hand.   I. He is supposed to be taking 25 mcg of Synthroid daily, 10 mg of lisinopril daily, and 10 mg of atorvastatin daily. He had stopped doing so, but re-started yesterday morning. [name] Seta has not been supervising him as much in the past year, but will do so now.   5. Pertinent Review of Systems:  Constitutional: The patient feels "good overall", but still a little bit weak at times.  However, when he becomes hypoglycemic or extremely hyperglycemic, he can still be very irritable and sometimes even combative.  Eyes: Vision is about the same. He is legally blind. He had his annual eye exam in October or November 2019. He was told that his diabetes is not interfering with his eyes. He will have a follow up appointment in June.  Neck: The patient has no complaints of anterior neck swelling, soreness, tenderness, pressure, discomfort, or difficulty swallowing.  Heart: The patient has no other complaints of palpitations, irregular heat beats, chest pain, or chest pressure. Gastrointestinal: He does not usually have post-prandial bloating. He is no longer constipated. The patient has no other complaints of excessive hunger, acid reflux, stomach aches or pains, diarrhea, or constipation. Legs: He no longer has pains in the right upper thigh. Muscle mass and strength seem normal. There are no other complaints of numbness, tingling, burning, or pain. No edema is noted. Hands: No problems Feet: He says he no longer picks at his toe nails. There are no recent complaints of numbness, tingling, burning, or pain. No edema is noted. GU: He occasionally has nocturia. Hypoglycemia: He has not had many low BGs in the past month.    .    6. BG meter review: We have data from the past two weeks. He checks BGs 0-5 times daily, average 1.7 times. His average BG is 471, range 83-Hi. He had one BG <100, an 83. He had no BGs in the 100s. He had three BGs in the 200s. He had two BG in the 300s. He had one BG in the 400s. He had four BGs in the 500s. He had 11 BGs that were HI (>600). The BGs in the 500s and HIs indicate that he is often missing his insulin doses.  7. FreeStyle Libre download:   A. His Libre kept coming off, so he is no longer using it.   B. In April 2021, his average daily SGs had varied from 100 to 276 when he was at home. Some days he took his insulin twice daily, but sometimes only once.  His SGs when he was at his grandmother's home were all >400. He has had several SGS <70, but he couldn't identify a pattern.   PAST MEDICAL, FAMILY, AND SOCIAL HISTORY:  Past Medical History:  Diagnosis Date   Angiopathy, diabetic (HCC)    Diabetes mellitus    Type 1, diagnosed age 42, with frequent DKA admissions, difficult to control due to MR  Diabetic nephropathy (HCC)    Started on Lisinopril 5 mg   Diabetic peripheral neuropathy (HCC)    Dysmorphic features    Goiter    Hypertension    Hypoglycemia associated with diabetes (HCC)    Hypothyroidism    Impaired cognition    mention of mental retardation   Mental retardation    Moderate or severe vision impairment, both eyes, impairment level not further specified    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     Current Outpatient Medications:    aspirin EC 81 MG tablet, Take 1 tablet (81 mg total) by mouth daily., Disp: 90 tablet, Rfl: 3   atorvastatin (LIPITOR) 10 MG tablet, TAKE 1 TABLET (10 MG TOTAL) BY MOUTH DAILY. (Patient not taking: Reported on 09/26/2020), Disp: 90 tablet, Rfl: 0   BD PEN NEEDLE MICRO U/F 32G X 6 MM MISC, USE TO INJECT INSULIN 6 TIMES DAILY. AS NEEDED, Disp: 200 each, Rfl: 5   Continuous Blood Gluc Sensor (FREESTYLE LIBRE 14 DAY SENSOR) MISC, Change every 14 days (Patient not taking: Reported on 09/26/2020), Disp: 3 each, Rfl: 5   ibuprofen (ADVIL) 200 MG tablet, Take 200 mg by mouth every 6 (six) hours as needed for fever or moderate pain (throat pain). (Patient not taking: Reported on 09/26/2020), Disp: , Rfl:    Insulin Lispro Prot & Lispro (HUMALOG MIX 75/25 KWIKPEN) (75-25) 100 UNIT/ML Kwikpen, INJECT 28  UNITS INTO THE SKIN IN THE MORNING AND 24 UNITS IN THE EVENING, Disp: 15 mL, Rfl: 4   lisinopril (ZESTRIL) 10 MG tablet, TAKE 1 TABLET BY MOUTH EVERY DAY, Disp: 90 tablet, Rfl: 1   ONETOUCH VERIO test strip, USE TO CHECK BLOOD SUGAR 3 TIMES DAILY AS DIRECTED, Disp: 300 strip, Rfl: 1   SYNTHROID 25 MCG tablet, TAKE 1 TABLET BY MOUTH EVERY DAY BEFORE BREAKFAST, Disp: 90 tablet, Rfl: 1  Allergies as of 12/19/2020   (No Known Allergies)    1. Work and Family: [name] remains unemployed. He is still living with his sister, [name], and her family in [place]. [name] stays at home and so can look after [name]. [name]' friend, Gerlene Burdock, also usually lives in the house. When they are together, [name] and Gerlene Burdock help each other out, but have not been exercising much in he Winter months. Mom has gone back to work. Mom is also trying to care for her mother. Mom's father died in 27-Mar-2020.   2. Activities: [name] has not been very active.  3. Smoking, alcohol, or drugs: No tobacco or alcohol or drugs.  4. Primary Care Provider: He can't remember his new doctor's name.  REVIEW OF SYSTEMS: There are no other significant problems involving [place]'s other body systems.   Objective:  Vital Signs:  There were no vitals taken for this visit.   Ht Readings from Last 3 Encounters:  04/03/20 5\' 4"  (1.626 m)  09/18/19 5\' 4"  (1.626 m)  09/10/19 5\' 4"  (1.626 m)   Wt Readings from Last 3 Encounters:  09/26/20 130 lb 6.4 oz (59.1 kg)  09/18/19  146 lb 2 oz (66.3 kg)  09/13/19 142 lb 6.4 oz (64.6 kg)   There is no height or weight on file to calculate BSA.  Facility age limit for growth percentiles is 20 years. Facility age limit for growth percentiles is 20 years.  PHYSICAL EXAM:  Constitutional: The patient appears healthy, but his gait is still somewhat wide-based and shuffling due to his visual handicap. His weight has decreased by 16 pounds. He was fairly engaged and interactive at his level  today. He still does not really remember much about checking his BGs and taking his insulins. His thought processes are still very concrete. His insight remains poor. He really does not understand or comprehend much about diabetes, about why he needs to take care of himself, and about what will happen to him if he does not take care of himself.   Face: The face is somewhat dysmorphic. His face is very similar to mom's face and [name]'s face. Eyes: There is no obvious arcus or proptosis. His eyes are slightly dry.  Mouth: The oropharynx appears normal. His geographic tongue has resolved. He no longer has any maxillary teeth, but he is wearing his new dentures. His mouth is fairly moist.    Neck: The neck appears to be visibly normal. No carotid bruits are noted. The thyroid gland has shrunk back to normal at about 20 grams today. The consistency of the thyroid gland is normal today. The thyroid gland is not tender to palpation. Lungs: The lungs are clear to auscultation. Air movement is good. Heart: Heart rate is rapid, but rhythm is regular. Heart sounds S1 and S2 are normal. I did not appreciate any pathologic cardiac murmurs. Abdomen: The abdomen is normal in size, but he has more adipose tissue today. Bowel sounds are normal. There is no obvious hepatomegaly, splenomegaly, or other mass effect.  Arms: Muscle size and bulk are low for age. He has no new areas of excoriation on his arms. The older areas have healed.  Hands: There is no obvious tremor. Phalangeal and metacarpophalangeal joints are normal. Palmar muscles are normal. Palmar skin is normal. Palmar moisture is also normal. Nail beds are pallid. He has the area of the excoriated bite on his left hand. The site is clean.  Legs: Muscles appear normal for age. No edema is present. Feet: Feet are normally formed. DP and PT pulses are trace. His left great toenail has healed from his prior stubbing injury.  Neurologic: Strength is normal for age  in both the upper and lower extremities. Muscle tone is normal. Sensation to touch is normal in both the legs and feet.    LAB DATA:   Labs 09/26/20: HbA1c <14%, CBG 364  Labs 09/13/19: CBG 406; urine glucose positive, urine ketones negative; He apparently did not take his AM insulin until later this afternoon.   Labs 09/10/19: HbA1c 9.3%  Labs 06/15/19: HbA1c 9.0%, CBG 196; TSH 1.98, free T4 1.2, free T3 3.4; CMP normal; CBC normal, cholesterol 137, triglycerides 96, HDL 46, LDL 73; urinary microalbumin/creatinine ratio 1,599  Labs 12/14/18: HbA1c 9.7%, CBG 304  Labs 08/01/18: HbA1c 8.9%, CBG 89  Labs 02/23/18: HbA1c 8.8%, CBG 201; TSH 5.00, free T4 1.1, free T3 3.2;   Labs 11/23/17: CBG 338  Labs 09/26/17: HbA1c 10.9%, CBG 364, urine glucose 2000, urine ketones negative  Labs 06/27/17: HbA1c 9.0%, CBG 134; TSH 1.49, free T4 1.2, free T3 3.1; CMP normal, except for glucose of 213; cholesterol 134, triglycerides 79, HDL  53, LDL 65; urinary microalbumin/creatinine ratio was 748   Labs 12/31/15: HbA1c 11.7%, CBG 145  Labs 09/29/16: HbA1c 10.2%, CBG 70  Labs 06/01/16: HbA1c >14%. CBG 288; TSH 2.65, free T4 1.2, free T3 2.7; cholesterol 201, triglycerides 106, HDL 54, LDL 127; CMP normal except glucose 130 and albumin 3.5.   Labs 01/05/16: HbA1c 11.0%  Labs 11/04/15: HbA1c 10.5%  Labs 09/12/15: HbA1c 10.3%; CMP normal except for glucose 149 and sodium 133; cholesterol 190, triglycerides 90, HDL 55, LDL 117; urinary microalbumin/creatinine ratio 190; TSH 3.09, free T4 1.3, free T3 3.8  Labs 07/16/15: HbA1c 10.6%, Urine dipstick shows trace ketones  Labs 04/09/15: HbA1c 10.4%  Labs 09/09/14: HbA1c 11.9%; TSH 1.822, free T4 1.02, and free T3 2.6; CMP normal except for glucose of 273; cholesterol 174, triglycerides 181, HDL 46, LDL 92; urinary microalbumin/creatinine ratio 139.2  Labs 06/19/14: HbA1c 10.7%  Labs 05/12/14: CMP normal  Labs 02/19/14: HbA1c 10.2%  Labs 07/31/13: HbA1c 8.5%; CMP  normal, except glucose 269; microalbumin/creatinine ratio 51.6; TSH 1.885, free T4 1.31, free T3 3.4   Labs 7/018/14: CMP normal, except glucose of 14; cholesterol 202, triglycerides 85, HDL 58, LDL 127; urinary microalbumin/creatinine ratio 51.9; TSH 2.993, free T4 1.32, free T3 2.8  05/10/12: Creatinine 0.62, TSH 1.025,            Assessment and Plan:   ASSESSMENT:  1-2. Diabetes mellitus/hypoglycemia:   A. The patient's HbA1c is so high that it can't be measured. His BGs are too high. He is missing too many insulin doses.   Di Kindle needs continuing adult supervision of his  BG checks and insulin doses. He is incapable of taking care of himself consistently. 3. Hypertension: Blood pressure is higher today. He may have missed some lisinopril doses. He also has not been exercising. He needs adult supervision of all of his oral medication.  4. Peripheral neuropathy: This problem has improved and is not evident today, but will certainly recur if he does not get his BGs under better control.   5. Angiopathy: His angiopathy is still fairly severe. He needs to walk daily. If he exercises on a regular basis and controls his BGs, the blood flow in his legs and feet will likely improve. 6. Goiter/thyroiditis: Thyroid gland has shrunk back to normal size today. The process of waxing and waning of thyroid gland size is c/w evolving Hashimoto's thyroiditis.   7. Hypothyroid: He was mid-range euthyroid in April 2016, but borderline low in April 2017. His TSH in January 2018 was better, but still above the goal range of 1.0-2.0. His TSH in February 2019 was within the goal range. His TFTs in October 2019 were low, largely due to missing Synthroid doses. His TFTs in January 2021 were mid-euthyroid.  He needs to take his Synthroid daily. He needs adult supervision.  8. Autonomic neuropathy, tachycardia, bloating, and gastroparesis: His heart rate is normal and he is not having any GI symptoms.   9. Combined  hyperlipidemia: His lipids in February 2019 and again in January 2021 were normal on his current dose of atorvastatin.  10. Weight loss, unintentional: He has lost more weight, c/w underinsulinization.     11. Dehydration: He is not dehydrated today.  12. Medical neglect: [name]' sister, [name] Seta, and her husband had been trying to supervise [name] in the past, but have not done very well for months. [name] agrees to resume supervising his BG checks at breakfast and at dinner, his oral pills once daily,  and his insulin doses at breakfast and dinner.   13. 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  14. Microalbuminuria/macroalbuminuria: His microalbumin/creatinine ratio in February 2019 was the highest that it has been in the prior four nears. His ratio in January 2021 was even higher. He needs better control of both his BGs and his BPs. He still needs to take his lisinopril every day.   15. Acute coronary syndrome: This event scared [name]. Since [name] does not have a PCP, I referred him to [facility] for further E&M. He has not had a recurrence. He missed his cardiology appointment, so needs to re-schedule.. 16. Pallor of nail beds: His CBC in January 2021 was normal. His CBC on 09/10/19 had normal RBC indices.   PLAN:  1. Diagnostic: HbA1c and CBG today. Reviewed CGM printout and his annual surveillance lab results. Call me in one week with the BG numbers.  2. Therapeutic:   A. Continue  the Humalog Mix 75/25 insulin dose in the mornings of 28 units. Continue  the dinner insulin dose to 24 units. However, if the morning BG is <100, reduce the morning insulin dose to 26 units. If the dinner BG is <100, reduce the dinner insulin dose to 23 units. If the BG at bedtime is <200, follow the Small column bedtime snack plan.    B. Take oral medications once a day, at whatever time the family can fit in the doses.   C. I asked that [name] directly supervise his BG checks, insulin dosing, and  oral meds.  3. Patient education: We discussed all of the above. I asked [name] again to please cooperate with his family members. 4. Follow-up: 2 months. Ensure that [employer] and/or mother can attend.   Level of Service: This visit lasted in excess of 55 minutes. More than 50% of the visit was devoted to counseling.  Molli Knock, MD 12/18/2020 10:16 PM   Addendum 09/30/20: This note was completed on 09/26/20, but could not be signed until today due to a POC lab test performed that day not being finally resulted until 09/30/20.  Molli Knock, MD, CDE

## 2020-12-19 ENCOUNTER — Ambulatory Visit (INDEPENDENT_AMBULATORY_CARE_PROVIDER_SITE_OTHER): Payer: Medicaid Other | Admitting: "Endocrinology

## 2020-12-19 ENCOUNTER — Other Ambulatory Visit (INDEPENDENT_AMBULATORY_CARE_PROVIDER_SITE_OTHER): Payer: Self-pay

## 2020-12-19 DIAGNOSIS — E063 Autoimmune thyroiditis: Secondary | ICD-10-CM

## 2020-12-19 DIAGNOSIS — E1065 Type 1 diabetes mellitus with hyperglycemia: Secondary | ICD-10-CM

## 2020-12-19 MED ORDER — ATORVASTATIN CALCIUM 10 MG PO TABS: 10.0000 mg | ORAL_TABLET | Freq: Every day | ORAL | 0 refills | Status: DC

## 2020-12-19 MED ORDER — BD PEN NEEDLE MICRO U/F 32G X 6 MM MISC: 0 refills | Status: DC

## 2020-12-19 MED ORDER — ONETOUCH VERIO VI STRP: ORAL_STRIP | 0 refills | Status: DC

## 2020-12-19 MED ORDER — LISINOPRIL 10 MG PO TABS: 10.0000 mg | ORAL_TABLET | Freq: Every day | ORAL | 0 refills | Status: DC

## 2020-12-19 MED ORDER — SYNTHROID 25 MCG PO TABS: ORAL_TABLET | ORAL | 0 refills | Status: DC

## 2021-01-10 ENCOUNTER — Other Ambulatory Visit (INDEPENDENT_AMBULATORY_CARE_PROVIDER_SITE_OTHER): Payer: Self-pay | Admitting: "Endocrinology

## 2021-01-10 DIAGNOSIS — E1065 Type 1 diabetes mellitus with hyperglycemia: Secondary | ICD-10-CM

## 2021-01-10 DIAGNOSIS — E063 Autoimmune thyroiditis: Secondary | ICD-10-CM

## 2021-02-03 ENCOUNTER — Other Ambulatory Visit (INDEPENDENT_AMBULATORY_CARE_PROVIDER_SITE_OTHER): Payer: Self-pay | Admitting: "Endocrinology

## 2021-02-03 DIAGNOSIS — E1065 Type 1 diabetes mellitus with hyperglycemia: Secondary | ICD-10-CM

## 2021-02-03 DIAGNOSIS — E063 Autoimmune thyroiditis: Secondary | ICD-10-CM

## 2021-02-04 ENCOUNTER — Other Ambulatory Visit (INDEPENDENT_AMBULATORY_CARE_PROVIDER_SITE_OTHER): Payer: Self-pay | Admitting: "Endocrinology

## 2021-02-04 DIAGNOSIS — E063 Autoimmune thyroiditis: Secondary | ICD-10-CM

## 2021-02-04 DIAGNOSIS — E1065 Type 1 diabetes mellitus with hyperglycemia: Secondary | ICD-10-CM

## 2021-02-19 ENCOUNTER — Other Ambulatory Visit (INDEPENDENT_AMBULATORY_CARE_PROVIDER_SITE_OTHER): Payer: Self-pay | Admitting: "Endocrinology

## 2021-02-19 DIAGNOSIS — E063 Autoimmune thyroiditis: Secondary | ICD-10-CM

## 2021-02-19 DIAGNOSIS — E1065 Type 1 diabetes mellitus with hyperglycemia: Secondary | ICD-10-CM

## 2021-02-24 ENCOUNTER — Emergency Department (HOSPITAL_COMMUNITY)
Admission: EM | Admit: 2021-02-24 | Discharge: 2021-02-25 | Disposition: A | Discharge status: Home / Self Care | Payer: Medicare Other | Attending: Emergency Medicine | Admitting: Emergency Medicine

## 2021-02-24 ENCOUNTER — Encounter (HOSPITAL_COMMUNITY): Payer: Self-pay | Admitting: Emergency Medicine

## 2021-02-24 DIAGNOSIS — R42 Dizziness and giddiness: Secondary | ICD-10-CM | POA: Diagnosis not present

## 2021-02-24 DIAGNOSIS — Z5321 Procedure and treatment not carried out due to patient leaving prior to being seen by health care provider: Secondary | ICD-10-CM | POA: Insufficient documentation

## 2021-02-24 DIAGNOSIS — R7309 Other abnormal glucose: Secondary | ICD-10-CM | POA: Diagnosis not present

## 2021-02-24 LAB — CBC
HCT: 40.2 % (ref 39.0–52.0)
Hemoglobin: 14 g/dL (ref 13.0–17.0)
MCH: 31.7 pg (ref 26.0–34.0)
MCHC: 34.8 g/dL (ref 30.0–36.0)
MCV: 91 fL (ref 80.0–100.0)
Platelets: 231 10*3/uL (ref 150–400)
RBC: 4.42 MIL/uL (ref 4.22–5.81)
RDW: 11.6 % (ref 11.5–15.5)
WBC: 9.3 10*3/uL (ref 4.0–10.5)
nRBC: 0 % (ref 0.0–0.2)

## 2021-02-24 LAB — BASIC METABOLIC PANEL
Anion gap: 8 (ref 5–15)
BUN: 29 mg/dL — ABNORMAL HIGH (ref 6–20)
CO2: 27 mmol/L (ref 22–32)
Calcium: 9.2 mg/dL (ref 8.9–10.3)
Chloride: 94 mmol/L — ABNORMAL LOW (ref 98–111)
Creatinine, Ser: 1.39 mg/dL — ABNORMAL HIGH (ref 0.61–1.24)
GFR, Estimated: >60 mL/min (ref >60)
Glucose, Bld: 530 mg/dL (ref 70–99)
Potassium: 4.9 mmol/L (ref 3.5–5.1)
Sodium: 129 mmol/L — ABNORMAL LOW (ref 135–145)

## 2021-02-24 LAB — URINALYSIS, ROUTINE W REFLEX MICROSCOPIC
Bacteria, UA: NONE SEEN
Bilirubin Urine: NEGATIVE
Glucose, UA: >=500 mg/dL — AB
Ketones, ur: 5 mg/dL — AB
Leukocytes,Ua: NEGATIVE
Nitrite: NEGATIVE
Protein, ur: 100 mg/dL — AB
Specific Gravity, Urine: 1.026 (ref 1.005–1.030)
pH: 5 (ref 5.0–8.0)

## 2021-02-24 LAB — CBG MONITORING, ED: Glucose-Capillary: 530 mg/dL (ref 70–99)

## 2021-02-24 NOTE — ED Triage Notes (Signed)
Patient BIB [facility] from work after he started to feel dizzy at work. CBG with EMS reads "high".   BP 121/88 Hr 93 SpO2 100% on room air RR 20 97.49F

## 2021-02-24 NOTE — ED Notes (Signed)
Called pt for vitals 3x, no response.

## 2021-02-24 NOTE — ED Provider Notes (Signed)
Emergency Medicine Provider Triage Evaluation Note  [name] , a 35 y.o. male  was evaluated in triage.  Pt is a type I diabetic and was at work with presyncopal and syncopal episodes.  Felt dizzy prior to "blacking out."  Sugars have been up and down at home. On insulin at home, took it at lunch time.  Review of Systems  Positive: Dizzy, syncope Negative: NV, CP, SOB  Physical Exam  BP 114/81 (BP Location: Left Arm)   Pulse 100   Temp 98.2 F (36.8 C) (Oral)   Resp 16   SpO2 100%  Gen:   Awake, no distress   Resp:  Normal effort  MSK:   Moves extremities without difficulty  Other:    Medical Decision Making  Medically screening exam initiated at 1:03 PM.  Appropriate orders placed.  [name] was informed that the remainder of the evaluation will be completed by another provider, this initial triage assessment does not replace that evaluation, and the importance of remaining in the ED until their evaluation is complete.     [name] Benders, PA-C 02/24/21 1309    Rozelle Logan, [place] 02/24/21 1430

## 2021-02-24 NOTE — ED Notes (Signed)
Called pt for vitals, no response.

## 2021-03-01 NOTE — Progress Notes (Signed)
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  HISTORY OF PRESENT ILLNESS:   [name] is a 35 y.o. Caucasian young man.  [name] was accompanied by his sister, [name].       1. I first consulted on [name] when he was admitted to the Pediatric Ward at the [facility] in May 2007.  A. The patient was diagnosed with T1DM at age 65 in about 2001 or 2002. We have few details about his medical care until 2007. We know that in about 2006 he saw a pediatric endocrinologist at [facility], [facility], [facility]. Unfortunately, the family chose not to return to see the endocrinologist. In about April of 2007, his primary care provider, Dr. Reed [name] of [facility], started the patient on an insulin pump. Unfortunately, neither the patient nor the family were capable of utilizing the pump's technology effectively.   B. On 10/02/2005 the patient was admitted to the [facility] for poorly controlled type 1 diabetes. I consulted on the patient at that time. 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 He did not have the insight or intelligence to perform the daily tasks of diabetes self-care independently or to utilize the insulin pump. It also appeared that there was not a great deal of adult supervision and support within his family. We tried to teach him and his family how to follow a multiple daily injection of insulin plan with Lantus and Novolog insulin, but that attempt failed rapidly. I changed  his regimen to Novolog Mix 70/30 insulin taken twice daily. He was to take 26 units each morning at breakfast and 14 units each evening at supper. I also gave him a sliding scale for Humalog lispro with different doses of insulin to be used at breakfast, lunch, supper, and bedtime in addition to his 70/30 insulin when his family members were available to check the blood sugars and give the additional insulin. The patient's family members agreed to supervise his care.  2. Unfortunately, [name] has frequently not had the adult supervision and support that he needed. In the last 15 years, the patient has been scheduled to return to our clinic for follow up visits approximately every 3 months, but has actually only returned to our clinic for follow up visits 1-4 times per year, although the family has done much better in the past three years. In 2012 he had 3 visits and was admitted once. In 2013 he had one admission in December, followed by a clinic visit later that month. In 2014 he had 4 clinic visits. In 2015 he had two clinic visits. In fairness, however, I should note that I was unavailable to see him for two months in 2015 due to my severe leg injury and post-operative recuperation. He had 4 visits in 2016 and 4 visits in 2017, but in 2018 he only had 3 visits. In 2019 he had 4 visits. In 2020 he had 2 visit. In 2021 he had two visits. After moving in with his sister and her family in January 2019, the adult supervision and support in [name]' life had substantially improved, but in the last year have deteriorated.  3. Clinical course: During the past 15 years [name] has had multiple medical problems:  A. T1DM: His hemoglobin A1c values have been mostly in the 9.0-14.0% range. He has the following complications of diabetes: diabetic angiopathy, peripheral neuropathy, autonomic neuropathy, inappropriate sinus tachycardia, gastroparesis and postprandial abdominal bloating, chronic constipation, urinary  microalbuminuria, and occasional but significant hypoglycemia. Due to health insurance restrictions he has taken Novolog 70/30 Mix insulin at some times and Humalog 75/25 Mix insulin at other times.   B. Autoimmune hypothyroidism: In November 2007, the patient was diagnosed with hypothyroidism secondary to Hashimoto's disease and was started on Synthroid at a dose of 25 mcg per day.   C. Hypertension: In November 2008, he was diagnosed with hypertension and started on lisinopril, 5 mg per day.   D. Hypercholesterolemia: In July 2014 he was diagnosed with hypercholesterolemia and started on atorvastatin, 10 mg/day.  E. Noncompliance/medical neglect/inadequate medical supervision: Unfortunately, [name] has frequently missed BG checks, doses of insulin, and doses of his oral medications. Parental/family supervision and support have varied in consistency and in quality throughout these years. However, since [name] has been living with [name] and her family, the supervision and support have been much better.  [name] had an episode of chest pain and pallor on 11/05/18. He was admitted to [name] where he was diagnosed with acute coronary syndrome. That episode scared him and caused him to try to be somewhat more compliant with his DM care plan.   G. On 09/09/19 he presented to the ED at [facility] with hyperglycemia, DKA, and dehydration. His CBG was 559. His serum glucose was 573, CO2 19, anion gap 19, BHOB 5.9% (ref 0.05-00.27), HbA1c 9.3%, urine glucose >500, urine ketones 80. He was treated with iv insulin per [facility]. He was discharged on 09/11/19. In retrospect, he had been visiting his grandmother in [place] for 3 days and had forgotten to bring his insulin with him. When he arrived home on 09/09/19 his mother recognized that he was sick and had severe ketonuria, so she gave him 29 units of 70/30 insulin and then immediately took him to the ED.   H. On 05/11/20 he was positive for SARS-COV-2. He had previously been  vaccinated on 09/24/19.  4. [name]' last [facility] clinic visit was on 09/26/2020.  At that visit we continued his Humalog Mix 75/25 insulin doses of 28 units in the mornings and 24 units in the evenings. He was suppose to return to see me in 2 months, but did not. He was a No Show for two appointments in July 2022.  A. In the interim he had been fairly healthy until last Tuesday, 02/25/20, when he felt weak and dizzy and his BG was Hi (>600). He was taken to the [facility].  Serum glucose was 530. Serum sodium decreased to 129 and creatinine increased to 1.38. Urine glucose was > 500, ketones 5. He was released without treatment, perhaps because the family took him home before the evaluation was completed.    B. Because [name] is more involved with her own children, she can't pay as much attention to [name] as she wants on weekdays.   C. On weekdays, [name] is supervised by his mother, who is also working to support the family.   D. BG control has still been "up and down", mainly up.   E. He has not really been trying to eat better. He has had little physical activity during the past several months.    F. He continues on  his morning 75/25 insulin dose of 28 units and his evening dose of 24 units.  He says he is not missing many insulin doses, but his printout suggests otherwise.    G. His dentures are working well.   H. He has both Medicaid and Medicare coverage, however, [place] Medicaid will no longer pay for medications. He receives his insulins and DM supplies through [facility].   I.  His skin lesions have healed up.   [name] says that [name] is receiving his 25 mcg of Synthroid daily, 10 mg of lisinopril daily, and 10 mg of atorvastatin daily.   K. He had repeated problems with his Josephine Igo coming out, so he stopped using it.    5. Pertinent Review of Systems:  Constitutional: The patient feels "good overall". However, when he becomes hypoglycemic or extremely hyperglycemic, he can still be very irritable  and sometimes even combative.  Eyes: Vision is about the same. He is legally blind. He had his annual eye exam in June 2022. He was told that his diabetes is not interfering with his eyes, but he does have small cataracts.  Neck: The patient has no complaints of anterior neck swelling, soreness, tenderness, pressure, discomfort, or difficulty swallowing.  Heart: The patient has no other complaints of palpitations, irregular heat beats, chest pain, or chest pressure. Gastrointestinal: He does not usually have post-prandial bloating. He is no longer constipated. The patient has no other complaints of excessive hunger, acid reflux, stomach aches or pains, diarrhea, or constipation. Hands: No problems Legs: Muscle mass and strength seem normal. There are no other complaints of numbness, tingling, burning, or pain. No edema is noted. Feet: He says he no longer picks at his toe nails. There are no recent complaints of numbness, tingling, burning, or pain. No edema is noted. GU: He occasionally has nocturia. Hypoglycemia: He has not had many low BGs in the past month.    .    6. BG meter review: The dates were wrong, so we could not obtain any usable data. When I reviewed the meter, the BGs varied form 71 to HI, mostly >300.   PAST MEDICAL, FAMILY, AND SOCIAL HISTORY:  Past Medical History:  Diagnosis Date   Angiopathy, diabetic (HCC)    Diabetes mellitus    Type 1, diagnosed age 31, with frequent DKA admissions, difficult to control due to MR   Diabetic nephropathy (HCC)    Started on Lisinopril 5 mg   Diabetic peripheral neuropathy (HCC)    Dysmorphic features    Goiter    Hypertension    Hypoglycemia associated with diabetes (HCC)    Hypothyroidism    Impaired cognition    mention of mental retardation   Mental retardation    Moderate or severe vision impairment, both eyes, impairment level not further specified    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   Thyroiditis,  autoimmune     Family History  Problem Relation Age of Onset   Vision loss Father    Retinitis pigmentosa Mother    Diabetes Maternal Grandmother        AODM   Diabetes Paternal Grandmother        AODM   Diabetes Cousin        Second cousin has juvenile-onset DM.     Current Outpatient Medications:    aspirin EC 81 MG tablet, Take 1 tablet (81 mg total) by mouth daily., Disp: 90 tablet, Rfl: 3  atorvastatin (LIPITOR) 10 MG tablet, TAKE 1 TABLET BY MOUTH EVERY DAY, Disp: 90 tablet, Rfl: 1   glucose blood (ONETOUCH VERIO) test strip, Check blood sugar 8 times a day, Disp: 300 strip, Rfl: 1   Insulin Lispro Prot & Lispro (HUMALOG MIX 75/25 KWIKPEN) (75-25) 100 UNIT/ML Kwikpen, INJECT 28 UNITS INTO THE SKIN IN THE MORNING AND 24 UNITS IN THE EVENING, Disp: 15 mL, Rfl: 4   Insulin Pen Needle (BD PEN NEEDLE MICRO U/F) 32G X 6 MM MISC, USE TO INJECT INSULIN 6 TIMES DAILY. AS NEEDED, Disp: 200 each, Rfl: 0   lisinopril (ZESTRIL) 10 MG tablet, TAKE 1 TABLET BY MOUTH EVERY DAY, Disp: 90 tablet, Rfl: 1   SYNTHROID 25 MCG tablet, TAKE 1 TABLET BY MOUTH EVERY DAY BEFORE BREAKFAST, Disp: 90 tablet, Rfl: 1   Continuous Blood Gluc Sensor (FREESTYLE LIBRE 14 DAY SENSOR) MISC, Change every 14 days (Patient not taking: No sig reported), Disp: 3 each, Rfl: 5   ibuprofen (ADVIL) 200 MG tablet, Take 200 mg by mouth every 6 (six) hours as needed for fever or moderate pain (throat pain). (Patient not taking: No sig reported), Disp: , Rfl:   Allergies as of 03/02/2021   (No Known Allergies)    1. Work and Family: [name] is working 5 days a week, 8 hours per day. [name]' friend, [name], also usually lives in the house. When they are together, [name] and [name] help each other out, but have not been exercising much recently. Mom has gone back to work. Mom is also trying to care for her mother. Mom's father died in Apr 06, 2020.   2. Activities: [name] has not been very active.  3. Smoking, alcohol, or drugs: No  tobacco or alcohol or drugs.  4. Primary Care Provider: He can't remember his new doctor's name.  REVIEW OF SYSTEMS: There are no other significant problems involving Viliami's other body systems.   Objective:  Vital Signs:  BP 112/70 (BP Location: Right Arm, Patient Position: Sitting, Cuff Size: Normal)   Pulse 76   Wt 135 lb (61.2 kg)   BMI 23.17 kg/m    Ht Readings from Last 3 Encounters:  04/03/20  (1.626 m)  09/18/19  (1.626 m)  09/10/19  (1.626 m)   Wt Readings from Last 3 Encounters:  03/02/21 135 lb (61.2 kg)  09/26/20 130 lb 6.4 oz (59.1 kg)  09/18/19 146 lb 2 oz (66.3 kg)   Body surface area is 1.66 meters squared.  Facility age limit for growth percentiles is 20 years. Facility age limit for growth percentiles is 20 years.  PHYSICAL EXAM:  Constitutional: The patient appears healthy, but his gait is still somewhat wide-based and shuffling due to his visual handicap. His weight has increased by 5 pounds. He was fairly engaged and interactive at his level today. He still does not really remember much about checking his BGs and taking his insulins. His thought processes are still very concrete, but are somewhat better today. His insight remains poor. He really does not understand or comprehend much about diabetes, about why he needs to take care of himself, and about what will happen to him if he does not take care of himself.   Face: The face is somewhat dysmorphic. His face is very similar to mom's face and [name]'s face. Eyes: There is no obvious arcus or proptosis. His eyes are moist.  Mouth: The oropharynx appears normal. His geographic tongue has resolved. He no longer has any  maxillary teeth, but he is wearing his new dentures. His mouth is fairly moist.    Neck: The neck appears to be visibly normal. No carotid bruits are noted. The thyroid gland has shrunk back to normal at about 20 grams today. The consistency of the thyroid gland is normal  today. The thyroid gland is not tender to palpation. Lungs: The lungs are clear to auscultation. Air movement is good. Heart: Heart rate is rapid, but rhythm is regular. Heart sounds S1 and S2 are normal. I did not appreciate any pathologic cardiac murmurs. Abdomen: The abdomen is normal in size. Bowel sounds are normal. There is no obvious hepatomegaly, splenomegaly, or other mass effect.  Arms: Muscle size and bulk are low for age. He has no new areas of excoriation on his arms. The older areas have healed.  Hands: There is no obvious tremor. Phalangeal and metacarpophalangeal joints are normal. Palmar muscles are normal. Palmar skin is normal. Palmar moisture is also normal. Nail beds are pallid.  Legs: Muscles appear normal for age. No edema is present. Feet: Feet are normally formed. DP and PT pulses are trace.  Neurologic: Strength is normal for age in both the upper and lower extremities. Muscle tone is normal. Sensation to touch is normal in both the legs and feet.    LAB DATA:   Labs 03/02/21: HbA1c 12.9%, CBG 486; urine ketones negative  Labs 09/26/20: HbA1c >14%, CBG 364  Labs 09/13/19: CBG 406; urine glucose positive, urine ketones negative; He apparently did not take his AM insulin until later this afternoon.   Labs 09/10/19: HbA1c 9.3%  Labs 06/15/19: HbA1c 9.0%, CBG 196; TSH 1.98, free T4 1.2, free T3 3.4; CMP normal; CBC normal, cholesterol 137, triglycerides 96, HDL 46, LDL 73; urinary microalbumin/creatinine ratio 1,599  Labs 12/14/18: HbA1c 9.7%, CBG 304  Labs 08/01/18: HbA1c 8.9%, CBG 89  Labs 02/23/18: HbA1c 8.8%, CBG 201; TSH 5.00, free T4 1.1, free T3 3.2;   Labs 11/23/17: CBG 338  Labs 09/26/17: HbA1c 10.9%, CBG 364, urine glucose 2000, urine ketones negative  Labs 06/27/17: HbA1c 9.0%, CBG 134; TSH 1.49, free T4 1.2, free T3 3.1; CMP normal, except for glucose of 213; cholesterol 134, triglycerides 79, HDL 53, LDL 65; urinary microalbumin/creatinine ratio was 748    Labs 12/31/15: HbA1c 11.7%, CBG 145  Labs 09/29/16: HbA1c 10.2%, CBG 70  Labs 06/01/16: HbA1c >14%. CBG 288; TSH 2.65, free T4 1.2, free T3 2.7; cholesterol 201, triglycerides 106, HDL 54, LDL 127; CMP normal except glucose 130 and albumin 3.5.   Labs 01/05/16: HbA1c 11.0%  Labs 11/04/15: HbA1c 10.5%  Labs 09/12/15: HbA1c 10.3%; CMP normal except for glucose 149 and sodium 133; cholesterol 190, triglycerides 90, HDL 55, LDL 117; urinary microalbumin/creatinine ratio 190; TSH 3.09, free T4 1.3, free T3 3.8  Labs 07/16/15: HbA1c 10.6%, Urine dipstick shows trace ketones  Labs 04/09/15: HbA1c 10.4%  Labs 09/09/14: HbA1c 11.9%; TSH 1.822, free T4 1.02, and free T3 2.6; CMP normal except for glucose of 273; cholesterol 174, triglycerides 181, HDL 46, LDL 92; urinary microalbumin/creatinine ratio 139.2  Labs 06/19/14: HbA1c 10.7%  Labs 05/12/14: CMP normal  Labs 02/19/14: HbA1c 10.2%  Labs 07/31/13: HbA1c 8.5%; CMP normal, except glucose 269; microalbumin/creatinine ratio 51.6; TSH 1.885, free T4 1.31, free T3 3.4   Labs 7/018/14: CMP normal, except glucose of 14; cholesterol 202, triglycerides 85, HDL 58, LDL 127; urinary microalbumin/creatinine ratio 51.9; TSH 2.993, free T4 1.32, free T3 2.8  05/10/12: Creatinine 0.62, TSH  1.025,            Assessment and Plan:   ASSESSMENT:  1-2. Diabetes mellitus/hypoglycemia:   A. The patient's HbA1c is better, but still far too high. We can't download his BG meter today, so we can't understand what his BGs are how his insulin doses affect his BGs. He is still probably missing too many insulin doses.   Di Kindle needs continuing adult supervision of his  BG checks and insulin doses. He is incapable of consistently and intelligently taking care of himself and his T1DM.  3. Hypertension: Blood pressure is higher today. He may have missed some lisinopril doses. He also has not been exercising. He needs adult supervision of all of his oral medication.  4.  Peripheral neuropathy: This problem has improved and is not evident today, but will certainly recur if he does not get his BGs under better control.   5. Angiopathy: His angiopathy is still fairly severe. He needs to walk daily. If he exercises on a regular basis and controls his BGs, the blood flow in his legs and feet will likely improve. 6. Goiter/thyroiditis: Thyroid gland has shrunk back to top-normal size today. The process of waxing and waning of thyroid gland size is c/w evolving Hashimoto's thyroiditis.   7. Hypothyroid: He was mid-range euthyroid in April 2016, but borderline low in April 2017. His TSH in January 2018 was better, but still above the goal range of 1.0-2.0. His TSH in February 2019 was within the goal range. His TFTs in October 2019 were low, largely due to missing Synthroid doses. His TFTs in January 2021 were mid-euthyroid.  He needs to take his Synthroid daily. He needs adult supervision.  8. Autonomic neuropathy, tachycardia, bloating, and gastroparesis: His heart rate is normal and he is not having any GI symptoms.   9. Combined hyperlipidemia: His lipids in February 2019 and again in January 2021 were normal on his current dose of atorvastatin.  10. Weight loss, unintentional: He has re-gained weight, c/w taking more insulin than at his last visit.      11. Dehydration: He is not dehydrated today.  12. Medical neglect: [name]' sister, [name], and her husband had been trying to supervise [name] in the past, but have not done as well as they had done in the past. [name] had agreed to supervising most of his BG checks at breakfast and at dinner, his oral pills once daily, and his insulin doses at breakfast and dinner.  However, there are times when [name] is so involved with her own family that she can't adequately supervise [name]. Fortunately, [name] is doing somewhat better;  13. 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  14. Microalbuminuria/macroalbuminuria: His  microalbumin/creatinine ratio in February 2019 was the highest that it has been in the prior four nears. His ratio in January 2021 was even higher. He needs better control of both his BGs and his BPs. He still needs to take his lisinopril every day.   15. Acute coronary syndrome: This event scared [name]. Since [name] does not have a PCP, I referred him to [facility] for further E&M. He has not had a recurrence. He missed his cardiology appointment, so needs to re-schedule.. 16. Pallor of nail beds: His CBC in January 2021 was normal. His CBC on 09/10/19 had normal RBC indices.   PLAN:  1. Diagnostic: HbA1c and CBG today. I ordered annual surveillance tests today.  Call me in one week with the BG numbers.  2. Therapeutic:   A. Continue  the Humalog Mix 75/25 insulin dose in the mornings of 28 units. Continue  the dinner insulin dose of 24 units. However, if the morning BG is <100, reduce the morning insulin dose to 26 units. If the dinner BG is <100, reduce the dinner insulin dose to 23 units. If the BG at bedtime is <200, follow the Small column bedtime snack plan.    B. Take oral medications once a day, at whatever time the family can fit in the doses.   C. I asked that [name] directly supervise his BG checks, insulin dosing, and oral meds.  3. Patient education: We discussed all of the above. I asked [name] again to please cooperate with his family members. 4. Follow-up: 2 months. Ensure that [employer] and/or mother can attend.   Level of Service: This visit lasted in excess of 6 minutes. More than 50% of the visit was devoted to counseling.  Molli Knock, MD 03/02/2021 2:46 PM

## 2021-03-02 ENCOUNTER — Encounter (INDEPENDENT_AMBULATORY_CARE_PROVIDER_SITE_OTHER): Payer: Self-pay | Admitting: "Endocrinology

## 2021-03-02 ENCOUNTER — Ambulatory Visit (INDEPENDENT_AMBULATORY_CARE_PROVIDER_SITE_OTHER): Payer: Medicare Other | Admitting: "Endocrinology

## 2021-03-02 ENCOUNTER — Other Ambulatory Visit: Payer: Self-pay

## 2021-03-02 VITALS — BP 112/70 | HR 76 | Wt 135.0 lb

## 2021-03-02 DIAGNOSIS — E860 Dehydration: Secondary | ICD-10-CM

## 2021-03-02 DIAGNOSIS — R000 Tachycardia, unspecified: Secondary | ICD-10-CM

## 2021-03-02 DIAGNOSIS — E1043 Type 1 diabetes mellitus with diabetic autonomic (poly)neuropathy: Secondary | ICD-10-CM

## 2021-03-02 DIAGNOSIS — G609 Hereditary and idiopathic neuropathy, unspecified: Secondary | ICD-10-CM

## 2021-03-02 DIAGNOSIS — E1065 Type 1 diabetes mellitus with hyperglycemia: Secondary | ICD-10-CM | POA: Diagnosis not present

## 2021-03-02 DIAGNOSIS — K141 Geographic tongue: Secondary | ICD-10-CM

## 2021-03-02 DIAGNOSIS — E049 Nontoxic goiter, unspecified: Secondary | ICD-10-CM | POA: Diagnosis not present

## 2021-03-02 DIAGNOSIS — I10 Essential (primary) hypertension: Secondary | ICD-10-CM

## 2021-03-02 DIAGNOSIS — E10649 Type 1 diabetes mellitus with hypoglycemia without coma: Secondary | ICD-10-CM

## 2021-03-02 DIAGNOSIS — E063 Autoimmune thyroiditis: Secondary | ICD-10-CM

## 2021-03-02 DIAGNOSIS — E782 Mixed hyperlipidemia: Secondary | ICD-10-CM

## 2021-03-02 DIAGNOSIS — R634 Abnormal weight loss: Secondary | ICD-10-CM

## 2021-03-02 LAB — POCT GLUCOSE (DEVICE FOR HOME USE): POC Glucose: 486 mg/dl — AB (ref 70–99)

## 2021-03-02 LAB — POCT URINALYSIS DIPSTICK: Ketones, UA: NEGATIVE

## 2021-03-02 LAB — POCT GLYCOSYLATED HEMOGLOBIN (HGB A1C): Hemoglobin A1C: 12.9 % — AB (ref 4.0–5.6)

## 2021-03-02 NOTE — Patient Instructions (Signed)
Follow up visit in 2 months.  

## 2021-03-03 DIAGNOSIS — I249 Acute ischemic heart disease, unspecified: Secondary | ICD-10-CM | POA: Diagnosis present

## 2021-03-03 LAB — COMPREHENSIVE METABOLIC PANEL
AG Ratio: 1.4 (calc) (ref 1.0–2.5)
ALT: 18 U/L (ref 9–46)
AST: 15 U/L (ref 10–40)
Albumin: 3.5 g/dL — ABNORMAL LOW (ref 3.6–5.1)
Alkaline phosphatase (APISO): 98 U/L (ref 36–130)
BUN/Creatinine Ratio: 22 (calc) (ref 6–22)
BUN: 26 mg/dL — ABNORMAL HIGH (ref 7–25)
CO2: 30 mmol/L (ref 20–32)
Calcium: 9.1 mg/dL (ref 8.6–10.3)
Chloride: 100 mmol/L (ref 98–110)
Creat: 1.18 mg/dL (ref 0.60–1.26)
Globulin: 2.5 g/dL (calc) (ref 1.9–3.7)
Glucose, Bld: 367 mg/dL — ABNORMAL HIGH (ref 65–139)
Potassium: 4.9 mmol/L (ref 3.5–5.3)
Sodium: 136 mmol/L (ref 135–146)
Total Bilirubin: 0.3 mg/dL (ref 0.2–1.2)
Total Protein: 6 g/dL — ABNORMAL LOW (ref 6.1–8.1)

## 2021-03-03 LAB — MICROALBUMIN / CREATININE URINE RATIO
Creatinine, Urine: 53 mg/dL (ref 20–320)
Microalb Creat Ratio: 1026 mcg/mg creat — ABNORMAL HIGH (ref <30)
Microalb, Ur: 54.4 mg/dL

## 2021-03-03 LAB — LIPID PANEL
Cholesterol: 160 mg/dL (ref <200)
HDL: 57 mg/dL (ref >=40)
LDL Cholesterol (Calc): 86 mg/dL (calc)
Non-HDL Cholesterol (Calc): 103 mg/dL (calc) (ref <130)
Total CHOL/HDL Ratio: 2.8 (calc) (ref <5.0)
Triglycerides: 83 mg/dL (ref <150)

## 2021-03-03 LAB — TSH: TSH: 2.34 mIU/L (ref 0.40–4.50)

## 2021-03-03 LAB — T4, FREE: Free T4: 1.1 ng/dL (ref 0.8–1.8)

## 2021-03-03 LAB — T3, FREE: T3, Free: 3.2 pg/mL (ref 2.3–4.2)

## 2021-03-06 ENCOUNTER — Telehealth (INDEPENDENT_AMBULATORY_CARE_PROVIDER_SITE_OTHER): Payer: Self-pay

## 2021-03-06 NOTE — Telephone Encounter (Signed)
-----   Message from David Stall, MD sent at 03/04/2021 12:16 PM EDT ----- Thyroid tests were normal.  CMP was abnormal, with glucose of 367, BUN 26, ans albumin 3.5. The elevated BUN shows he was somewhat dehydrated due to peeing out so much sugar and water. The mildly low albumin shows that he has been losing too much protein in the urine due to having such high blood sugars.  Cholesterols and triglycerides were normal. Urine protein was very high, indicating kidney damage due to high sugars.

## 2021-03-06 NOTE — Telephone Encounter (Signed)
Called. LVM with call back number.  

## 2021-03-16 ENCOUNTER — Telehealth (INDEPENDENT_AMBULATORY_CARE_PROVIDER_SITE_OTHER): Payer: Self-pay | Admitting: "Endocrinology

## 2021-03-16 NOTE — Telephone Encounter (Signed)
Who's calling (name and relationship to patient) : [name]   Best contact number: [phone]  Provider they see: Dr. Fransico Michael  Reason for call: Would like to hear back about lab results.   Call ID:      PRESCRIPTION REFILL ONLY  Name of prescription:  Pharmacy:

## 2021-03-16 NOTE — Telephone Encounter (Signed)
  Who's calling (name and relationship to patient) :  Berna Bue Best contact [phone]  Provider they see: Dr. Fransico Michael  Reason for call: Returning Call for voice message was left concerning Patient lab result      PRESCRIPTION REFILL ONLY  Name of prescription:  Pharmacy:

## 2021-03-17 ENCOUNTER — Telehealth (INDEPENDENT_AMBULATORY_CARE_PROVIDER_SITE_OTHER): Payer: Self-pay | Admitting: "Endocrinology

## 2021-03-17 NOTE — Telephone Encounter (Signed)
Called patient yesterday and today and left messages. LVM with call back number.

## 2021-03-20 NOTE — Telephone Encounter (Signed)
LVM to return call.

## 2021-03-25 NOTE — Telephone Encounter (Signed)
error 

## 2021-04-02 ENCOUNTER — Ambulatory Visit (HOSPITAL_COMMUNITY)
Admission: EM | Admit: 2021-04-02 | Discharge: 2021-04-02 | Disposition: A | Discharge status: Home / Self Care | Payer: Medicare Other | Attending: Family Medicine | Admitting: Family Medicine

## 2021-04-02 ENCOUNTER — Encounter (HOSPITAL_COMMUNITY): Payer: Self-pay | Admitting: Emergency Medicine

## 2021-04-02 ENCOUNTER — Other Ambulatory Visit: Payer: Self-pay

## 2021-04-02 DIAGNOSIS — Z794 Long term (current) use of insulin: Secondary | ICD-10-CM | POA: Insufficient documentation

## 2021-04-02 DIAGNOSIS — E1021 Type 1 diabetes mellitus with diabetic nephropathy: Secondary | ICD-10-CM | POA: Diagnosis not present

## 2021-04-02 DIAGNOSIS — Z20822 Contact with and (suspected) exposure to covid-19: Secondary | ICD-10-CM | POA: Insufficient documentation

## 2021-04-02 DIAGNOSIS — R509 Fever, unspecified: Secondary | ICD-10-CM | POA: Insufficient documentation

## 2021-04-02 DIAGNOSIS — E1042 Type 1 diabetes mellitus with diabetic polyneuropathy: Secondary | ICD-10-CM | POA: Insufficient documentation

## 2021-04-02 DIAGNOSIS — J069 Acute upper respiratory infection, unspecified: Secondary | ICD-10-CM | POA: Diagnosis not present

## 2021-04-02 DIAGNOSIS — E063 Autoimmune thyroiditis: Secondary | ICD-10-CM | POA: Insufficient documentation

## 2021-04-02 DIAGNOSIS — R531 Weakness: Secondary | ICD-10-CM | POA: Diagnosis not present

## 2021-04-02 DIAGNOSIS — R059 Cough, unspecified: Secondary | ICD-10-CM | POA: Insufficient documentation

## 2021-04-02 LAB — POC INFLUENZA A AND B ANTIGEN (URGENT CARE ONLY)
INFLUENZA A ANTIGEN, POC: NEGATIVE
INFLUENZA B ANTIGEN, POC: NEGATIVE

## 2021-04-02 MED ORDER — OSELTAMIVIR PHOSPHATE 75 MG PO CAPS: 75.0000 mg | ORAL_CAPSULE | Freq: Two times a day (BID) | ORAL | 0 refills | Status: DC
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
## 2021-04-02 NOTE — ED Triage Notes (Signed)
Pt is present today with fever, cough, and weakness. Pt sx started yesterday

## 2021-04-03 LAB — SARS CORONAVIRUS 2 (TAT 6-24 HRS): SARS Coronavirus 2: NEGATIVE

## 2021-05-05 ENCOUNTER — Ambulatory Visit (INDEPENDENT_AMBULATORY_CARE_PROVIDER_SITE_OTHER): Payer: Medicaid Other | Admitting: "Endocrinology

## 2021-05-07 ENCOUNTER — Other Ambulatory Visit (INDEPENDENT_AMBULATORY_CARE_PROVIDER_SITE_OTHER): Payer: Self-pay | Admitting: "Endocrinology

## 2021-05-07 ENCOUNTER — Ambulatory Visit (INDEPENDENT_AMBULATORY_CARE_PROVIDER_SITE_OTHER): Payer: Medicaid Other | Admitting: "Endocrinology

## 2021-05-07 DIAGNOSIS — E1065 Type 1 diabetes mellitus with hyperglycemia: Secondary | ICD-10-CM

## 2021-06-02 NOTE — Progress Notes (Signed)
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  HISTORY OF PRESENT ILLNESS:   [name] is a 36 y.o. Caucasian young man.  [name] was accompanied by his aunt, [name]. .       1. I first consulted on [name] when he was admitted to the Pediatric Ward at the [facility] in May 2007.  A. The patient was diagnosed with T1DM at age 36 in about 2001 or 2002. We have few details about his medical care until 2007. We know that in about 2006 he saw a pediatric endocrinologist at [facility], [facility], [facility]. Unfortunately, the family chose not to return to see the endocrinologist. In about April of 2007, his primary care provider, Dr. Waymon Amato of [facility], started the patient on an insulin pump. Unfortunately, neither the patient nor the family were capable of utilizing the pump's technology effectively.   B. On 10/02/2005 the patient was admitted to the [facility] for poorly controlled type 1 diabetes. I consulted on the patient at that time. 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 He did not have the insight or intelligence to perform the daily tasks of diabetes self-care independently or to utilize the insulin pump. It also appeared that there was not a great deal of adult supervision and support within his family. We tried to teach him and his family how to follow a multiple daily injection of insulin plan with Lantus and Novolog insulin, but that attempt failed rapidly. I changed his  regimen to Novolog Mix 70/30 insulin taken twice daily. He was to take 26 units each morning at breakfast and 14 units each evening at supper. I also gave him a sliding scale for Humalog lispro with different doses of insulin to be used at breakfast, lunch, supper, and bedtime in addition to his 70/30 insulin when his family members were available to check the blood sugars and give the additional insulin. The patient's family members agreed to supervise his care.  2. Unfortunately, [name] has frequently not had the adult supervision and support that he needed. In the last 15 years, the patient has been scheduled to return to our clinic for follow up visits approximately every 3 months, but has actually only returned to our clinic for follow up visits 1-4 times per year, although the family has done much better in the past three years. In 2012 he had 3 visits and was admitted once. In 2013 he had one admission in December, followed by a clinic visit later that month. In 2014 he had 4 clinic visits. In 2015 he had two clinic visits. In fairness, however, I should note that I was unavailable to see him for two months in 2015 due to my severe leg injury and post-operative recuperation. He had 4 visits in 2016 and 4 visits in 2017, but in 2018 he only had 3 visits. In 2019 he had 4 visits. In 2020 he had 2 visit. In 2021 he had two visits. After moving in with his sister and her family in January 2019, the adult supervision and support in [place]' life had substantially improved, but in the last year have  deteriorated.   3. Clinical course: During the past 15 years [name] has had multiple medical problems:  A. T1DM: His hemoglobin A1c values have been mostly in the 9.0-14.0% range. He has the following complications of diabetes: diabetic angiopathy, peripheral neuropathy, autonomic neuropathy, inappropriate sinus tachycardia, gastroparesis and postprandial abdominal bloating, chronic constipation, urinary  microalbuminuria, and occasional but significant hypoglycemia. Due to health insurance restrictions he has taken Novolog 70/30 Mix insulin at some times and Humalog 75/25 Mix insulin at other times.   B. Autoimmune hypothyroidism: In November 2007, the patient was diagnosed with hypothyroidism secondary to Hashimoto's disease and was started on Synthroid at a dose of 25 mcg per day.   C. Hypertension: In November 2008, he was diagnosed with hypertension and started on lisinopril, 5 mg per day.   D. Hypercholesterolemia: In July 2014 he was diagnosed with hypercholesterolemia and started on atorvastatin, 10 mg/day.  E. Noncompliance/medical neglect/inadequate medical supervision: Unfortunately, [name] has frequently missed BG checks, doses of insulin, and doses of his oral medications. Parental/family supervision and support have varied in consistency and in quality throughout these years. However, since [name] has been living with Nira Conn and her family, the supervision and support have been much better.  Marta Antu had an episode of chest pain and pallor on 11/05/18. He was admitted to [facility] where he was diagnosed with acute coronary syndrome. That episode scared him and caused him to try to be somewhat more compliant with his DM care plan.   G. On 09/09/19 he presented to the ED at [facility] with hyperglycemia, DKA, and dehydration. His CBG was 559. His serum glucose was 573, CO2 19, anion gap 19, BHOB 5.9% (ref 0.05-00.27), HbA1c 9.3%, urine glucose >500, urine ketones 80. He was treated with iv insulin per [facility]. He was discharged on 09/11/19. In retrospect, he had been visiting his grandmother in [place] for 3 days and had forgotten to bring his insulin with him. When he arrived home on 09/09/19 his mother recognized that he was sick and had severe ketonuria, so she gave him 29 units of 70/30 insulin and then immediately took him to the ED.   H. On 05/11/20 he was positive for SARS-COV-2. He had previously been  vaccinated on 09/24/19.  4. [name]' last [facility] clinic visit was on 03/02/2021.  At that visit we continued his Humalog Mix 75/25 insulin doses of 28 units in the mornings and 24 units in the evenings.   A. In the interim he had been fairly healthy until he developed more visual blurring. His last eye exam was last year.    Allie Bossier is sometimes supervised by his sister, Nira Conn Crimes, and sometimes by his mother. It appears that he is still not receiving enough supervision.  C. BGs have been "up and down", mainly up.   D. He has not really been trying to eat better.  E. He has had little physical activity during the past several months.    F. He continues on his morning 75/25 insulin dose of 28 units and his evening dose of 24 units.  He says he is not missing many insulin doses, but his printout suggests otherwise.    G. His dentures are working well.   H. He has both Medicaid and Medicare coverage, however, [place] Medicaid will no longer pay for medications. He receives his insulins and DM supplies through [facility].   Tillie Fantasia still scratches his skin frequently.   Evelena Asa is receiving his 58  mcg of Synthroid daily, 10 mg of lisinopril daily, and 10 mg of atorvastatin daily, when he takes them.   K. He no longer uses the [place] due to it coming out when he bumped into things.     5. Pertinent Review of Systems:  Constitutional: The patient feels "fine overall". However, when he becomes hypoglycemic or extremely hyperglycemic, he can still be very irritable and sometimes even combative.  Eyes: As above. Vision is worse. He is legally blind. He had his annual eye exam in June 2022. He was told that his diabetes is not interfering with his eyes, but he does have small cataracts.  Neck: The patient has no complaints of anterior neck swelling, soreness, tenderness, pressure, discomfort, or difficulty swallowing.  Heart: The patient has no other complaints of palpitations, irregular heat beats,  chest pain, or chest pressure. Gastrointestinal: He does not usually have post-prandial bloating. He is no longer constipated. The patient has no other complaints of excessive hunger, acid reflux, stomach aches or pains, diarrhea, or constipation. Hands: No problems Legs: Muscle mass and strength seem normal. There are no other complaints of numbness, tingling, burning, or pain. No edema is noted. Feet: He says he no longer picks at his toe nails. There are no recent complaints of numbness, tingling, burning, or pain. No edema is noted. GU: He occasionally has nocturia. Hypoglycemia: He had a 53 today at 5 AM. He has not had many low BGs in the past month.    .    6. BG meter review: Average BG was 389. He had two BGs <70: a 50 and a 56. He had one BG in zone, a 136. All of his other BGs varied from 257-HI. He has had 7 HI's in the past two weeks.    PAST MEDICAL, FAMILY, AND SOCIAL HISTORY:  Past Medical History:  Diagnosis Date   Angiopathy, diabetic ([place])    Diabetes mellitus    Type 1, diagnosed age 52, with frequent DKA admissions, difficult to control due to MR   Diabetic nephropathy ([place])    Started on Lisinopril 5 mg   Diabetic peripheral neuropathy ([facility])    Dysmorphic features    Goiter    Hypertension    Hypoglycemia associated with diabetes ([facility])    Hypothyroidism    Impaired cognition    mention of mental retardation   Mental retardation    Moderate or severe vision impairment, both eyes, impairment level not further specified    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     Current Outpatient Medications:    aspirin EC 81 MG tablet, Take 1 tablet (81 mg total) by mouth  daily., Disp: 90 tablet, Rfl: 3   atorvastatin (LIPITOR) 10 MG tablet, TAKE 1 TABLET BY MOUTH EVERY DAY, Disp: 90 tablet, Rfl: 1   glucose blood (ONETOUCH VERIO) test strip, Check blood sugar 8 times a day, Disp: 300 strip, Rfl: 1   Insulin Lispro Prot & Lispro (HUMALOG MIX 75/25 KWIKPEN) (75-25) 100 UNIT/ML Kwikpen, INJECT 28 UNITS INTO THE SKIN IN THE  MORNING AND 24 UNITS IN THE EVENING, Disp: 15 mL, Rfl: 5   Insulin Pen Needle (BD PEN NEEDLE MICRO U/F) 32G X 6 MM MISC, USE TO INJECT INSULIN 6 TIMES DAILY. AS NEEDED, Disp: 200 each, Rfl: 0   lisinopril (ZESTRIL) 10 MG tablet, TAKE 1 TABLET BY MOUTH EVERY DAY, Disp: 90 tablet, Rfl: 1   SYNTHROID 25 MCG tablet, TAKE 1 TABLET BY MOUTH EVERY DAY BEFORE BREAKFAST, Disp: 90 tablet, Rfl: 1   Continuous Blood Gluc Sensor (FREESTYLE LIBRE 14 DAY SENSOR) MISC, Change every 14 days (Patient not taking: No sig reported), Disp: 3 each, Rfl: 5   ibuprofen (ADVIL) 200 MG tablet, Take 200 mg by mouth every 6 (six) hours as needed for fever or moderate pain (throat pain). (Patient not taking: No sig reported), Disp: , Rfl:    oseltamivir (TAMIFLU) 75 MG capsule, Take 1 capsule (75 mg total) by mouth every 12 (twelve) hours. (Patient not taking: Reported on 06/03/2021), Disp: 10 capsule, Rfl: 0  Allergies as of 06/03/2021   (No Known Allergies)    1. Work and Family: [name] is working 5 days a week, 8 hours per day. [name]' friend, Delfino Lovett, also usually lives in the house. When they are together, [name] and Delfino Lovett help each other out, but have not been exercising much recently. Mom has gone back to work. Mom is also trying to care for her mother.   2. Activities: [name] has not been very active.  3. Smoking, alcohol, or drugs: No tobacco or alcohol or drugs.  4. Primary Care Provider: He can't remember his new doctor's name.  REVIEW OF SYSTEMS: There are no other significant problems involving Karis's other body systems.   Objective:  Vital Signs:  BP  128/74 (BP Location: Right Arm, Patient Position: Sitting, Cuff Size: Normal)    Pulse 84    Wt 134 lb 9.6 oz (61.1 kg)    BMI 23.10 kg/m    Ht Readings from Last 3 Encounters:  04/03/20 5\' 4"  (1.626 m)  09/18/19 5\' 4"  (1.626 m)  09/10/19 5\' 4"  (1.626 m)   Wt Readings from Last 3 Encounters:  06/03/21 134 lb 9.6 oz (61.1 kg)  03/02/21 135 lb (61.2 kg)  09/26/20 130 lb 6.4 oz (59.1 kg)   Body surface area is 1.66 meters squared.  Facility age limit for growth percentiles is 20 years. Facility age limit for growth percentiles is 20 years.  PHYSICAL EXAM:  Constitutional: The patient appears healthy, but his vision is worse and his gait is more wide-based and shuffling due to his visual handicap. His weight has decreased by 6.4 ounces. He was fairly engaged and interactive at his level today. He still does not really remember much about checking his BGs and taking his insulins. His thought processes are still very concrete. His insight remains poor. He really does not understand or comprehend much about diabetes, about why he needs to take care of himself, and about what will happen to him if he does not take care of himself.   Face: The face is somewhat dysmorphic. His face is very similar to mom's face and [name]'s face. Eyes: There is no obvious arcus or proptosis. His eyes are moist.  Mouth: The oropharynx appears normal. His geographic tongue has resolved. He no longer has any maxillary teeth, but he is wearing his new dentures. His mouth is fairly moist.    Neck: The neck appears to be visibly normal. No carotid bruits are noted. The thyroid  gland has shrunk back to normal at about 20 grams today. The consistency of the thyroid gland is normal today. The thyroid gland is not tender to palpation. Lungs: The lungs are clear to auscultation. Air movement is good. Heart: Heart rate is rapid, but rhythm is regular. Heart sounds S1 and S2 are normal. I did not appreciate any pathologic  cardiac murmurs. Abdomen: The abdomen is normal in size. Bowel sounds are normal. There is no obvious hepatomegaly, splenomegaly, or other mass effect.  Arms: Muscle size and bulk are low for age. He has several new areas of excoriation on his arms. The older areas have healed.  Hands: There is no obvious tremor. Phalangeal and metacarpophalangeal joints are normal. Palmar muscles are normal. Palmar skin is normal. Palmar moisture is also normal. Nail beds are pallid.  Legs: Muscles appear normal for age. No edema is present. Feet: Feet are normally formed. DP and PT pulses are trace. On the right and faint 1+ on the left.   Neurologic: Strength is low-normal for age in both the upper and lower extremities. Muscle tone is normal. Sensation to touch is normal in both the legs and feet.    LAB DATA:   Labs 06/03/21: HbA1c 13.8%, CBG 274  Labs 03/02/21: HbA1c 12.9%, CBG 486; urine ketones negative; TSH 2.34, free T4 1.1, free T3 3.2; CMP normal, except glucose 367, BUN 26, albumin 3.5 (ref 3.6-5.1); cholesterol 137, triglycerides 83, HDL 46, LDL 86; urinary microalbumin/creatinine ratio 1,026  Labs 09/26/20: HbA1c >14%, CBG 364  Labs 09/13/19: CBG 406; urine glucose positive, urine ketones negative; He apparently did not take his AM insulin until later this afternoon.   Labs 09/10/19: HbA1c 9.3%  Labs 06/15/19: HbA1c 9.0%, CBG 196; TSH 1.98, free T4 1.2, free T3 3.4; CMP normal; CBC normal, cholesterol 137, triglycerides 96, HDL 46, LDL 73; urinary microalbumin/creatinine ratio 1,599  Labs 12/14/18: HbA1c 9.7%, CBG 304  Labs 08/01/18: HbA1c 8.9%, CBG 89  Labs 02/23/18: HbA1c 8.8%, CBG 201; TSH 5.00, free T4 1.1, free T3 3.2;   Labs 11/23/17: CBG 338  Labs 09/26/17: HbA1c 10.9%, CBG 364, urine glucose 2000, urine ketones negative  Labs 06/27/17: HbA1c 9.0%, CBG 134; TSH 1.49, free T4 1.2, free T3 3.1; CMP normal, except for glucose of 213; cholesterol 134, triglycerides 79, HDL 53, LDL 65; urinary  microalbumin/creatinine ratio was 748   Labs 12/31/15: HbA1c 11.7%, CBG 145  Labs 09/29/16: HbA1c 10.2%, CBG 70  Labs 06/01/16: HbA1c >14%. CBG 288; TSH 2.65, free T4 1.2, free T3 2.7; cholesterol 201, triglycerides 106, HDL 54, LDL 127; CMP normal except glucose 130 and albumin 3.5.   Labs 01/05/16: HbA1c 11.0%  Labs 11/04/15: HbA1c 10.5%  Labs 09/12/15: HbA1c 10.3%; CMP normal except for glucose 149 and sodium 133; cholesterol 190, triglycerides 90, HDL 55, LDL 117; urinary microalbumin/creatinine ratio 190; TSH 3.09, free T4 1.3, free T3 3.8  Labs 07/16/15: HbA1c 10.6%, Urine dipstick shows trace ketones  Labs 04/09/15: HbA1c 10.4%  Labs 09/09/14: HbA1c 11.9%; TSH 1.822, free T4 1.02, and free T3 2.6; CMP normal except for glucose of 273; cholesterol 174, triglycerides 181, HDL 46, LDL 92; urinary microalbumin/creatinine ratio 139.2  Labs 06/19/14: HbA1c 10.7%  Labs 05/12/14: CMP normal  Labs 02/19/14: HbA1c 10.2%  Labs 07/31/13: HbA1c 8.5%; CMP normal, except glucose 269; microalbumin/creatinine ratio 51.6; TSH 1.885, free T4 1.31, free T3 3.4   Labs 7/018/14: CMP normal, except glucose of 14; cholesterol 202, triglycerides 85, HDL 58, LDL 127; urinary  microalbumin/creatinine ratio 51.9; TSH 2.993, free T4 1.32, free T3 2.8  05/10/12: Creatinine 0.62, TSH 1.025,            Assessment and Plan:   ASSESSMENT:  1-2. Diabetes mellitus/hypoglycemia:   A. The patient's HbA1c is worse. Most of his BGs are too high. It appears that [name] is frequently missing his insulin doses or not taking the full doses.   B.  Some od his BGs are low. When he takes insulin and does not eat enough, the BGs can drop.  Delmar Landau needs continuing adult supervision of his  BG checks and insulin doses. He is incapable of consistently and intelligently taking care of himself and his T1DM.  3. Hypertension: Blood pressure is good today. He may have missed some lisinopril doses. He also has not been exercising. He  needs adult supervision of all of his oral medication.  4. Peripheral neuropathy: This problem has improved and is not evident today, but can certainly recur if he does not get his BGs under better control.   5. Angiopathy: His angiopathy is still fairly severe. He needs to walk daily. If he exercises on a regular basis and controls his BGs, the blood flow in his legs and feet will likely improve. 6. Goiter/thyroiditis: Thyroid gland has shrunk back to top-normal size today. The process of waxing and waning of thyroid gland size is c/w evolving Hashimoto's thyroiditis.   7. Hypothyroid:  A. His TFTs have fluctuated through the years, in part due to whether or not he was taking his thyroid hormone.  B. His TFTs in October 2022 were normal, but lower. He needs to take his Synthroid daily. He needs adult supervision.  8. Autonomic neuropathy, tachycardia, bloating, and gastroparesis: His heart rate is normal and he is not having any GI symptoms.   9. Combined hyperlipidemia: His lipids in February 2019 , in January 2021, and October 2022 were normal on his current dose of atorvastatin.  10. Weight loss, unintentional: He has lost a few ounces, presumably due to underinsulinization.      11. Dehydration: He is not dehydrated today.  12. Medical neglect: [name]' sister, Nira Conn, and [name]' mother have been trying to supervise [name], but have not done as well as they had done in the past.  13. 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  14. Microalbuminuria/macroalbuminuria: His microalbumin/creatinine ratio in February 2019 was the highest that it has been in the prior four nears. His ratio in January 2021 was even higher. His ratio in October 2022 was lower, but still too high. He needs better control of both his BGs and his BPs. He still needs to take his lisinopril every day.   15. Acute coronary syndrome: This event scared [name]. Since [name] does not have a PCP, I referred him to [facility] for further E&M. He has not had a recurrence. He missed his cardiology appointment, so needs to re-schedule. 16. Pallor of nail beds: His CBC in January 2021 was normal. His CBC on 09/10/19 had normal RBC indices.   PLAN:  1. Diagnostic: HbA1c and CBG today. Family will call me in two weeks with BG data. 2. Therapeutic:   A. Continue  the Humalog Mix 75/25 insulin dose in the mornings of 28 units. Continue  the dinner insulin dose of 24 units. However, if the morning BG is <100, reduce the morning insulin dose to 26 units. If the dinner BG is <100, reduce the dinner insulin dose to  23 units. If the BG at bedtime is <200, follow the Small column bedtime snack plan.    B. Take oral medications once a day, at whatever time the family can fit in the doses.   C. I asked that Nira Conn and mother directly supervise his BG checks, insulin dosing, and oral meds.  3. Patient education: We discussed all of the above. I asked [name] again to please cooperate with his family members. 4. Follow-up: 2 months. Ensure that [employer] and/or mother can attend.   Level of Service: This visit lasted in excess of 60 minutes. More than 50% of the visit was devoted to counseling.  Tillman Sers, MD 06/03/2021 2:00 PM

## 2021-06-03 ENCOUNTER — Ambulatory Visit (INDEPENDENT_AMBULATORY_CARE_PROVIDER_SITE_OTHER): Payer: Medicare Other | Admitting: "Endocrinology

## 2021-06-03 ENCOUNTER — Other Ambulatory Visit: Payer: Self-pay

## 2021-06-03 ENCOUNTER — Telehealth: Payer: Self-pay

## 2021-06-03 ENCOUNTER — Encounter (INDEPENDENT_AMBULATORY_CARE_PROVIDER_SITE_OTHER): Payer: Self-pay | Admitting: "Endocrinology

## 2021-06-03 VITALS — BP 128/74 | HR 84 | Wt 134.6 lb

## 2021-06-03 DIAGNOSIS — E063 Autoimmune thyroiditis: Secondary | ICD-10-CM

## 2021-06-03 DIAGNOSIS — E10649 Type 1 diabetes mellitus with hypoglycemia without coma: Secondary | ICD-10-CM | POA: Diagnosis not present

## 2021-06-03 DIAGNOSIS — R809 Proteinuria, unspecified: Secondary | ICD-10-CM

## 2021-06-03 DIAGNOSIS — E1065 Type 1 diabetes mellitus with hyperglycemia: Secondary | ICD-10-CM

## 2021-06-03 DIAGNOSIS — E1043 Type 1 diabetes mellitus with diabetic autonomic (poly)neuropathy: Secondary | ICD-10-CM

## 2021-06-03 DIAGNOSIS — I10 Essential (primary) hypertension: Secondary | ICD-10-CM

## 2021-06-03 DIAGNOSIS — E049 Nontoxic goiter, unspecified: Secondary | ICD-10-CM | POA: Diagnosis not present

## 2021-06-03 DIAGNOSIS — E782 Mixed hyperlipidemia: Secondary | ICD-10-CM

## 2021-06-03 DIAGNOSIS — E1142 Type 2 diabetes mellitus with diabetic polyneuropathy: Secondary | ICD-10-CM

## 2021-06-03 DIAGNOSIS — K141 Geographic tongue: Secondary | ICD-10-CM

## 2021-06-03 LAB — POCT GLYCOSYLATED HEMOGLOBIN (HGB A1C): Hemoglobin A1C: 13.8 % — AB (ref 4.0–5.6)

## 2021-06-03 LAB — POCT GLUCOSE (DEVICE FOR HOME USE): Glucose Fasting, POC: 274 mg/dL — AB (ref 70–99)

## 2021-06-03 NOTE — Telephone Encounter (Signed)
error 

## 2021-06-03 NOTE — Patient Instructions (Signed)
Follow up visit in two months. Please take 28 units of the Novolog mix 75/25 in the mornings and 24 units in the evenings.

## 2021-06-05 ENCOUNTER — Telehealth (INDEPENDENT_AMBULATORY_CARE_PROVIDER_SITE_OTHER): Payer: Self-pay | Admitting: "Endocrinology

## 2021-06-05 NOTE — Telephone Encounter (Signed)
Who's calling (name and relationship to patient) : [name] mom   Best contact number: [phone]  Provider they see: Dr. Fransico Michael  Reason for call: Needs help getting into eye doctor sooner.   Call ID:      PRESCRIPTION REFILL ONLY  Name of prescription:  Pharmacy:

## 2021-06-08 NOTE — Telephone Encounter (Signed)
Spoke with Lawson Fiscal. She said that Dr Fransico Michael wants [place] to see Dr Dione Booze. Lawson Fiscal stated that Dawit has an appointment but she doesn't know when. She asked if I could call and get a sooner appointment. I let her know to call the Dr and ask them to put her on a waiting list to get in sooner if someone cancels. Mom agreed.

## 2021-07-21 ENCOUNTER — Other Ambulatory Visit (INDEPENDENT_AMBULATORY_CARE_PROVIDER_SITE_OTHER): Payer: Self-pay | Admitting: "Endocrinology

## 2021-07-21 DIAGNOSIS — E1065 Type 1 diabetes mellitus with hyperglycemia: Secondary | ICD-10-CM

## 2021-07-21 DIAGNOSIS — E063 Autoimmune thyroiditis: Secondary | ICD-10-CM

## 2021-08-04 NOTE — Progress Notes (Signed)
Subjective:  ?Patient Name: [name] Date of Birth: [birth year]  MRN: [id number] ? 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 ? ?HISTORY OF PRESENT ILLNESS:  ? ?[name] is a 36 y.o. Caucasian gentleman. ? ?[name] was accompanied by his father, Maxmilian Grannan.  .      ? ?1. I first consulted on [name] when he was admitted to the Pediatric Ward at the [facility] in May 2007. ? A. The patient was diagnosed with T1DM at age 27 in about 2001 or 2002. We have few details about his medical care until 2007. We know that in about 2006 he saw a pediatric endocrinologist at [facility], [facility], [facility]. Unfortunately, the family chose not to return to see the endocrinologist. In about April of 2007, his primary care provider, Dr. Waymon Amato of [facility], started the patient on an insulin pump. Unfortunately, neither the patient nor the family were capable of utilizing the pump's technology effectively.  ? B. On 10/02/2005 the patient was admitted to the [facility] for poorly controlled type 1 diabetes. I consulted on the patient at that time. 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 He did not have the insight or intelligence to perform the daily tasks of diabetes self-care independently or to utilize the insulin pump. It also appeared that there was not a great deal of adult supervision and support within his family. We tried to teach him and his family how to follow a multiple daily injection of insulin plan with Lantus and Novolog insulin, but that attempt failed rapidly. I changed  his regimen to Novolog Mix 70/30 insulin taken twice daily. He was to take 26 units each morning at breakfast and 14 units each evening at supper. I also gave him a sliding scale for Humalog lispro with different doses of insulin to be used at breakfast, lunch, supper, and bedtime in addition to his 70/30 insulin when his family members were available to check the blood sugars and give the additional insulin. The patient's family members agreed to supervise his care. ? ?2. Unfortunately, [name] has frequently not had the adult supervision and support that he needed. In the last 15 years, the patient has been scheduled to return to our clinic for follow up visits approximately every 3 months, but has actually only returned to our clinic for follow up visits 1-4 times per year, although the family has done much better in the past three years. In 2012 he had 3 visits and was admitted once. In 2013 he had one admission in December, followed by a clinic visit later that month. In 2014 he had 4 clinic visits. In 2015 he had two clinic visits. In fairness, however, I should note that I was unavailable to see him for two months in 2015 due to my severe leg injury and post-operative recuperation. He had 4 visits in 2016 and 4 visits in 2017, but in 2018 he only had 3 visits. In 2019 he had 4 visits. In 2020 he had 2 visit. In 2021 he had two visits. After moving in with his sister and her family in January 2019, the adult supervision and support in [place]' life had substantially improved, but in the last year have  deteriorated.  ? ?3. Clinical course: During the past 15 years [name] has had multiple medical problems: ? A. T1DM: His hemoglobin A1c values have been mostly in the 9.0-14.0% range. He has the following complications of diabetes: diabetic angiopathy, peripheral neuropathy, autonomic neuropathy, inappropriate sinus tachycardia, gastroparesis and postprandial abdominal bloating, chronic constipation, urinary  microalbuminuria, and occasional but significant hypoglycemia. Due to health insurance restrictions he has taken Novolog 70/30 Mix insulin at some times and Humalog 75/25 Mix insulin at other times.  ? B. Autoimmune hypothyroidism: In November 2007, the patient was diagnosed with hypothyroidism secondary to Hashimoto's disease and was started on Synthroid at a dose of 25 mcg per day.  ? C. Hypertension: In November 2008, he was diagnosed with hypertension and started on lisinopril, 5 mg per day.  ? D. Hypercholesterolemia: In July 2014 he was diagnosed with hypercholesterolemia and started on atorvastatin, 10 mg/day. ? E. Noncompliance/medical neglect/inadequate medical supervision: Unfortunately, [name] has frequently missed BG checks, doses of insulin, and doses of his oral medications. Parental/family supervision and support have varied in consistency and in quality throughout these years. However, since [name] has been living with Nira Conn and her family, the supervision and support have been much better. ? F. [name] had an episode of chest pain and pallor on 11/05/18. He was admitted to [facility] where he was diagnosed with acute coronary syndrome. That episode scared him and caused him to try to be somewhat more compliant with his DM care plan.  ? G. On 09/09/19 he presented to the ED at [facility] with hyperglycemia, DKA, and dehydration. His CBG was 559. His serum glucose was 573, CO2 19, anion gap 19, BHOB 5.9% (ref 0.05-00.27), HbA1c 9.3%, urine glucose >500, urine ketones 80. He was treated with iv insulin per [facility]. He was discharged on 09/11/19. In retrospect, he had been visiting his grandmother in [place] for 3 days and had forgotten to bring his insulin with him. When he arrived home on 09/09/19 his mother recognized that he was sick and had severe ketonuria, so she gave him 29 units of 70/30 insulin and then immediately took him to the ED.  ? H. On 05/11/20 he was positive for SARS-COV-2. He had previously been  vaccinated on 09/24/19. ? ?4. [name]' last [facility] clinic visit was on 06/03/2021.  At that visit we continued his Humalog Mix 75/25 insulin doses of 28 units in the mornings and 24 units in the evenings. I asked the family to supervise his BG checks and insulin doses more carefully and reliably.  ? A. In the interim he has been fairly healthy. His vision has improved and is not as blurry. He has his next follow up eye exam scheduled for June 2023.  ? B. [name] is sometimes supervised by his sister, Nira Conn Crimes, and sometimes by his mother. It appears that he is now receiving more supervision. ? C. BGs have been "a lot better". He is having more low BGs than high BGs.   ? D. He has not been eating much.  He is not drinking many regular sodas  ?E. He has been walking more outside.    ? F. He continues on his morning 75/25 insulin dose of 28 units and his evening dose of 24 units.  He says he is not missing many insulin doses.   ? G. His dentures are working well.  ? H. He has both Medicaid and Medicare coverage, however, [place] Medicaid will no longer pay for medications. He receives his  insulins and DM supplies through [facility].  ? I.  [name] no longer scratches his skin frequently.  ? J. [name] is receiving his 25 mcg of Synthroid daily, 10 mg of lisinopril daily, and 10 mg of atorvastatin daily, when he takes them.  ? K. He no longer uses the Kathryn due to it coming out when he bumped into things.    ? ?5. Pertinent Review of Systems:  ?Constitutional: The patient feels "good overall". However, when he becomes hypoglycemic or extremely hyperglycemic, he can still be very irritable and sometimes even combative.  ?Eyes: As above. Vision is better. He is legally blind. He had his annual eye exam in June 2022. He was told that his diabetes is not interfering with his eyes, but he does have small cataracts. He will have his next exam in June 2023.  ?Neck: The patient has no complaints of anterior neck swelling,  soreness, tenderness, pressure, discomfort, or difficulty swallowing.  ?Heart: The patient has no other complaints of palpitations, irregular heat beats, chest pain, or chest pressure. ?Gastrointestinal: He does not [place]

## 2021-08-05 ENCOUNTER — Ambulatory Visit (INDEPENDENT_AMBULATORY_CARE_PROVIDER_SITE_OTHER): Payer: Medicare Other | Admitting: "Endocrinology

## 2021-08-05 ENCOUNTER — Encounter (INDEPENDENT_AMBULATORY_CARE_PROVIDER_SITE_OTHER): Payer: Self-pay | Admitting: "Endocrinology

## 2021-08-05 ENCOUNTER — Other Ambulatory Visit: Payer: Self-pay

## 2021-08-05 VITALS — BP 140/70 | HR 72 | Wt 140.8 lb

## 2021-08-05 DIAGNOSIS — E782 Mixed hyperlipidemia: Secondary | ICD-10-CM

## 2021-08-05 DIAGNOSIS — E10649 Type 1 diabetes mellitus with hypoglycemia without coma: Secondary | ICD-10-CM | POA: Diagnosis not present

## 2021-08-05 DIAGNOSIS — K141 Geographic tongue: Secondary | ICD-10-CM

## 2021-08-05 DIAGNOSIS — E049 Nontoxic goiter, unspecified: Secondary | ICD-10-CM

## 2021-08-05 DIAGNOSIS — I249 Acute ischemic heart disease, unspecified: Secondary | ICD-10-CM

## 2021-08-05 DIAGNOSIS — E1065 Type 1 diabetes mellitus with hyperglycemia: Secondary | ICD-10-CM | POA: Diagnosis not present

## 2021-08-05 DIAGNOSIS — E1043 Type 1 diabetes mellitus with diabetic autonomic (poly)neuropathy: Secondary | ICD-10-CM

## 2021-08-05 DIAGNOSIS — E063 Autoimmune thyroiditis: Secondary | ICD-10-CM

## 2021-08-05 DIAGNOSIS — R634 Abnormal weight loss: Secondary | ICD-10-CM

## 2021-08-05 DIAGNOSIS — R809 Proteinuria, unspecified: Secondary | ICD-10-CM

## 2021-08-05 DIAGNOSIS — R231 Pallor: Secondary | ICD-10-CM

## 2021-08-05 LAB — POCT GLUCOSE (DEVICE FOR HOME USE): POC Glucose: 248 mg/dl — AB (ref 70–99)

## 2021-08-05 NOTE — Patient Instructions (Signed)
Follow up visit in 3 months.   At [facility], we are committed to providing exceptional care. You will receive a patient satisfaction survey through text or email regarding your visit today. Your opinion is important to me. Comments are appreciated.   

## 2021-08-06 LAB — CBC WITH DIFFERENTIAL/PLATELET
Absolute Monocytes: 400 cells/uL (ref 200–950)
Basophils Absolute: 22 cells/uL (ref 0–200)
Basophils Relative: 0.4 %
Eosinophils Absolute: 167 cells/uL (ref 15–500)
Eosinophils Relative: 3.1 %
HCT: 39.3 % (ref 38.5–50.0)
Hemoglobin: 12.9 g/dL — ABNORMAL LOW (ref 13.2–17.1)
Lymphs Abs: 1625 cells/uL (ref 850–3900)
MCH: 31 pg (ref 27.0–33.0)
MCHC: 32.8 g/dL (ref 32.0–36.0)
MCV: 94.5 fL (ref 80.0–100.0)
MPV: 10.4 fL (ref 7.5–12.5)
Monocytes Relative: 7.4 %
Neutro Abs: 3186 cells/uL (ref 1500–7800)
Neutrophils Relative %: 59 %
Platelets: 211 10*3/uL (ref 140–400)
RBC: 4.16 10*6/uL — ABNORMAL LOW (ref 4.20–5.80)
RDW: 11.8 % (ref 11.0–15.0)
Total Lymphocyte: 30.1 %
WBC: 5.4 10*3/uL (ref 3.8–10.8)

## 2021-08-06 LAB — HEMOGLOBIN A1C
Hgb A1c MFr Bld: 9 % of total Hgb — ABNORMAL HIGH (ref <5.7)
Mean Plasma Glucose: 212 mg/dL
eAG (mmol/L): 11.7 mmol/L

## 2021-08-06 LAB — IRON: Iron: 102 ug/dL (ref 50–180)

## 2021-08-10 ENCOUNTER — Ambulatory Visit: Payer: Medicare Other | Admitting: Physician Assistant

## 2021-08-21 ENCOUNTER — Other Ambulatory Visit (INDEPENDENT_AMBULATORY_CARE_PROVIDER_SITE_OTHER): Payer: Self-pay | Admitting: "Endocrinology

## 2021-08-21 DIAGNOSIS — E063 Autoimmune thyroiditis: Secondary | ICD-10-CM

## 2021-08-21 DIAGNOSIS — E1065 Type 1 diabetes mellitus with hyperglycemia: Secondary | ICD-10-CM

## 2021-08-25 ENCOUNTER — Other Ambulatory Visit (INDEPENDENT_AMBULATORY_CARE_PROVIDER_SITE_OTHER): Payer: Self-pay | Admitting: "Endocrinology

## 2021-08-25 DIAGNOSIS — E063 Autoimmune thyroiditis: Secondary | ICD-10-CM

## 2021-08-25 DIAGNOSIS — E1065 Type 1 diabetes mellitus with hyperglycemia: Secondary | ICD-10-CM

## 2021-09-09 NOTE — Progress Notes (Signed)
? ?Cardiology Office Note   ? ?Date:  09/14/2021  ? ?ID:  [name], DOB August 15, 1985, MRN [id number] ? ?PCP:  Margot Ables, MD  ?Cardiologist:  Lorine Bears, MD  ?Electrophysiologist:  None  ? ?Chief Complaint: Follow up ? ?History of Present Illness:  ? ?[name] is a 36 y.o. male with history of elevated troponin, type 1 diabetes, retinitis pigmentosa, mild cognitive delay, autonomic and peripheral neuropathy, HTN, hypothyroidism, and tachypalpitations who presents for follow up of elevated troponin. ?  ?He was admitted in 10/2018 with left-sided chest pain.  He was found to have a mildly elevated troponin of 0.35 which remained flat and nontrending.  CTA of the chest showed no evidence of PE or aortic dissection along with no evidence of significant coronary artery calcification.  Echo showed an EF of 60 to 65%, normal LV diastolic function, normal RV systolic function and cavity size, trivial mitral regurgitation.  Lexiscan MPI showed no significant ischemia with an EF of 55 to 65% and was overall a low risk study.  No clear cause for his elevated troponin was identified with recommendation for diagnostic cath in the setting of recurrent symptoms.  He was last seen virtually in 06/2019 and was doing well from a cardiac perspective with recommendation for continued primary prevention.  There was mention of consideration of Zio patch for previously noted mildly tachycardic heart rates on chart biopsy. ? ?He was last seen in 08/2019 for follow up after a recent admission for DKA without coma in the setting of missing insulin for several days.  He was without angina or decompensation at that time.  ?  ?He comes in accompanied by his aunt today and is doing well from a cardiac perspective.  He is without symptoms of angina or decompensation.  No palpitations, presyncope, or syncope.  Since he was last seen, he did have some dizziness, though this was attributed to abnormal blood  sugars.  Since he has been more adherent to his insulin regimen, this dizziness has resolved.  He is remaining active without cardiac limitation.  He plans on hiking later this summer with his family and is excited for this.  He does not have any active cardiac issues or concerns at this time. ? ? ?Labs independently reviewed: ?07/2021 - HGB 12.9, A1c 9.0 ?02/2021 - TSH normal, TC 160, TG 83, HDL 57, LDL 86, BUN 26, SCr 1.18, potassium 4.9, albumin 3.5, AST/ALT normal ? ?Past Medical History:  ?Diagnosis Date  ? Angiopathy, diabetic (HCC)   ? Diabetes mellitus   ? Type 1, diagnosed age 64, with frequent DKA admissions, difficult to control due to MR  ? Diabetic nephropathy (HCC)   ? Started on Lisinopril 5 mg  ? Diabetic peripheral neuropathy (HCC)   ? Dysmorphic features   ? Goiter   ? Hypertension   ? Hypoglycemia associated with diabetes (HCC)   ? Hypothyroidism   ? Impaired cognition   ? mention of mental retardation  ? Mental retardation   ? Moderate or severe vision impairment, both eyes, impairment level not further specified   ? Retinitis pigmentosa   ? familial - mother also has it  ? Thyroiditis, autoimmune   ? ? ?Past Surgical History:  ?Procedure Laterality Date  ? DENTAL SURGERY    ? ? ?Current Medications: ?Current Meds  ?Medication Sig  ? aspirin EC 81 MG tablet Take 1 tablet (81 mg total) by mouth daily.  ? atorvastatin (LIPITOR) 10 MG tablet TAKE 1  TABLET BY MOUTH EVERY DAY  ? Continuous Blood Gluc Sensor (FREESTYLE LIBRE 14 DAY SENSOR) MISC Change every 14 days  ? glucose blood (ONETOUCH VERIO) test strip CHECK BLOOD SUGAR 8 TIMES DAILY  ? ibuprofen (ADVIL) 200 MG tablet Take 200 mg by mouth every 6 (six) hours as needed for fever or moderate pain (throat pain).  ? Insulin Lispro Prot & Lispro (HUMALOG MIX 75/25 KWIKPEN) (75-25) 100 UNIT/ML Kwikpen INJECT 28 UNITS INTO THE SKIN IN THE MORNING AND 24 UNITS IN THE EVENING  ? Insulin Pen Needle (BD PEN NEEDLE MICRO U/F) 32G X 6 MM MISC USE TO INJECT  INSULIN 6 TIMES DAILY. AS NEEDED  ? lisinopril (ZESTRIL) 10 MG tablet TAKE 1 TABLET BY MOUTH EVERY DAY  ? oseltamivir (TAMIFLU) 75 MG capsule Take 1 capsule (75 mg total) by mouth every 12 (twelve) hours.  ? SYNTHROID 25 MCG tablet TAKE 1 TABLET BY MOUTH EVERY DAY BEFORE BREAKFAST  ? ? ?Allergies:   Patient has no known allergies.  ? ?Social History  ? ?Socioeconomic History  ? Marital status: Single  ?  Spouse name: Not on file  ? Number of children: Not on file  ? Years of education: 5712  ? Highest education level: Not on file  ?Occupational History  ?  Employer: [employer]  ?  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 Smoking status: Never  ? Smokeless tobacco: Never  ?Vaping Use  ? Vaping Use: Never used  ?Substance and Sexual Activity  ? Alcohol use: No  ?  Comment: Rarely consumes alcohol, perhaps once or twice per year.  ? Drug use: No  ? Sexual activity: Never  ?Other Topics Concern  ? Not on file  ?Social History Narrative  ? Lives in [place] with parents. Patient has 2 siblings a brother with a cardiac condition and a sister who is healthy.  ? Patient is legally blind and thus cannot drive.   ? Notable for developmental delay and illiteracy. 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  ? ?Social Determinants of Health  ? ?Financial Resource Strain: Not on file  ?Food Insecurity: Not on file  ?Transportation Needs: Not on file  ?Physical Activity: Not on file  ?Stress: Not on file  ?Social Connections: Not on file  ?  ? ?Family History:  ?The patient's family history includes Diabetes in his cousin, maternal grandmother, and paternal grandmother; Retinitis pigmentosa in his mother; Vision loss in his father. ? ?ROS:   ?12 point review of systems is negative unless otherwise noted in the HPI ? ? ?EKGs/Labs/Other Studies Reviewed:   ? ?Studies reviewed were summarized above. The additional studies were reviewed today: ? ?2D echo 10/2018: ?1. The left ventricle has normal systolic  function with an ejection  ?fraction of 60-65%. The cavity size was normal. Left ventricular diastolic  ?parameters were normal.  ? 2. The right ventricle has normal systolic function. The cavity was  ?normal. There is no increase in right ventricular wall thickness.  ?__________ ?  ?Lexiscan MPI 10/2018: ?Blood pressure demonstrated a normal response to exercise. ?There was no ST segment deviation noted during stress. ?The study is normal. ?This is a low risk study. ?The left ventricular ejection fraction is normal (55-65%). ? ? ?EKG:  EKG is ordered today.  The EKG ordered today demonstrates NSR, 85 bpm, no acute ST-T changes ? ?Recent Labs: ?03/02/2021: ALT 18; BUN 26; Creat 1.18; Potassium 4.9; Sodium 136; TSH 2.34 ?08/05/2021: Hemoglobin  12.9; Platelets 211  ?Recent Lipid Panel ?   ?Component Value Date/Time  ? CHOL 160 03/02/2021 1510  ? TRIG 83 03/02/2021 1510  ? HDL 57 03/02/2021 1510  ? CHOLHDL 2.8 03/02/2021 1510  ? VLDL 16 11/06/2018 0111  ? LDLCALC 86 03/02/2021 1510  ? ? ?PHYSICAL EXAM:   ? ?VS:  BP 118/80   Pulse 85   Ht 5\' 4"  (1.626 m)   Wt 136 lb (61.7 kg)   SpO2 98%   BMI 23.34 kg/m?   BMI: Body mass index is 23.34 kg/m?. ? ?Physical Exam ?Vitals reviewed.  ?Constitutional:   ?   Appearance: He is well-developed.  ?HENT:  ?   Head: Normocephalic and atraumatic.  ?Eyes:  ?   General:     ?   Right eye: No discharge.     ?   Left eye: No discharge.  ?Neck:  ?   Vascular: No JVD.  ?Cardiovascular:  ?   Rate and Rhythm: Normal rate and regular rhythm.  ?   Pulses:     ?     Posterior tibial pulses are 2+ on the right side and 2+ on the left side.  ?   Heart sounds: Normal heart sounds, S1 normal and S2 normal. Heart sounds not distant. No midsystolic click and no opening snap. No murmur heard. ?  No friction rub.  ?Pulmonary:  ?   Effort: Pulmonary effort is normal. No respiratory distress.  ?   Breath sounds: Normal breath sounds. No decreased breath sounds, wheezing or rales.  ?Chest:  ?   Chest  wall: No tenderness.  ?Abdominal:  ?   General: There is no distension.  ?   Palpations: Abdomen is soft.  ?   Tenderness: There is no abdominal tenderness.  ?Musculoskeletal:  ?   Cervical back: Normal range of motion.

## 2021-09-14 ENCOUNTER — Encounter: Payer: Self-pay | Admitting: Physician Assistant

## 2021-09-14 ENCOUNTER — Ambulatory Visit (INDEPENDENT_AMBULATORY_CARE_PROVIDER_SITE_OTHER): Payer: Medicare Other | Admitting: Physician Assistant

## 2021-09-14 VITALS — BP 118/80 | HR 85 | Ht 64.0 in | Wt 136.0 lb

## 2021-09-14 DIAGNOSIS — I10 Essential (primary) hypertension: Secondary | ICD-10-CM

## 2021-09-14 DIAGNOSIS — E785 Hyperlipidemia, unspecified: Secondary | ICD-10-CM

## 2021-09-14 DIAGNOSIS — R778 Other specified abnormalities of plasma proteins: Secondary | ICD-10-CM

## 2021-09-14 NOTE — Patient Instructions (Signed)
Medication Instructions:  No changes at this time.   *If you need a refill on your cardiac medications before your next appointment, please call your pharmacy*   Lab Work: None  If you have labs (blood work) drawn today and your tests are completely normal, you will receive your results only by: MyChart Message (if you have MyChart) OR A paper copy in the mail If you have any lab test that is abnormal or we need to change your treatment, we will call you to review the results.   Testing/Procedures: None   Follow-Up: At [facility], you and your health needs are our priority.  As part of our continuing mission to provide you with exceptional heart care, we have created designated Provider Care Teams.  These Care Teams include your primary Cardiologist (physician) and Advanced Practice Providers (APPs -  Physician Assistants and Nurse Practitioners) who all work together to provide you with the care you need, when you need it.   Your next appointment:   1 year(s)  The format for your next appointment:   In Person  Provider:   Muhammad Arida, MD or Ryan Dunn, PA-C        Important Information About Sugar       

## 2021-10-07 ENCOUNTER — Other Ambulatory Visit (INDEPENDENT_AMBULATORY_CARE_PROVIDER_SITE_OTHER): Payer: Self-pay | Admitting: "Endocrinology

## 2021-10-07 DIAGNOSIS — E1065 Type 1 diabetes mellitus with hyperglycemia: Secondary | ICD-10-CM

## 2021-10-27 ENCOUNTER — Ambulatory Visit (INDEPENDENT_AMBULATORY_CARE_PROVIDER_SITE_OTHER): Payer: Medicare Other | Admitting: "Endocrinology

## 2021-11-09 ENCOUNTER — Other Ambulatory Visit (INDEPENDENT_AMBULATORY_CARE_PROVIDER_SITE_OTHER): Payer: Self-pay | Admitting: "Endocrinology

## 2021-11-09 DIAGNOSIS — E063 Autoimmune thyroiditis: Secondary | ICD-10-CM

## 2021-11-09 DIAGNOSIS — E1065 Type 1 diabetes mellitus with hyperglycemia: Secondary | ICD-10-CM

## 2021-12-23 ENCOUNTER — Ambulatory Visit (INDEPENDENT_AMBULATORY_CARE_PROVIDER_SITE_OTHER): Payer: Medicare Other | Admitting: "Endocrinology

## 2021-12-23 NOTE — Progress Notes (Deleted)
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  HISTORY OF PRESENT ILLNESS:   [name] is a 36 y.o. Caucasian gentleman.  [name] was accompanied by his father, [name].  .       1. I first consulted on [name] when he was admitted to the Pediatric Ward at the [facility] in May 2007.  A. The patient was diagnosed with T1DM at age 20 in about 2001 or 2002. We have few details about his medical care until 2007. We know that in about 2006 he saw a pediatric endocrinologist at [facility], [facility], [facility]. Unfortunately, the family chose not to return to see the endocrinologist. In about April of 2007, his primary care provider, [name] of [facility], started the patient on an insulin pump. Unfortunately, neither the patient nor the family were capable of utilizing the pump's technology effectively.   B. On 10/02/2005 the patient was admitted to the [facility] for poorly controlled type 1 diabetes. I consulted on the patient at that time. 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 He did not have the insight or intelligence to perform the daily tasks of diabetes self-care independently or to utilize the insulin pump. It also appeared that there was not a great deal of adult supervision and support within his family. We tried to teach him and his family how to follow a multiple daily injection of insulin plan with Lantus and Novolog insulin, but that attempt failed rapidly. I changed  his regimen to Novolog Mix 70/30 insulin taken twice daily. He was to take 26 units each morning at breakfast and 14 units each evening at supper. I also gave him a sliding scale for Humalog lispro with different doses of insulin to be used at breakfast, lunch, supper, and bedtime in addition to his 70/30 insulin when his family members were available to check the blood sugars and give the additional insulin. The patient's family members agreed to supervise his care.  2. Unfortunately, [name] has frequently not had the adult supervision and support that he needed. In the last 15 years, the patient has been scheduled to return to our clinic for follow up visits approximately every 3 months, but has actually only returned to our clinic for follow up visits 1-4 times per year, although the family has done much better in the past three years. In 2012 he had 3 visits and was admitted once. In 2013 he had one admission in December, followed by a clinic visit later that month. In 2014 he had 4 clinic visits. In 2015 he had two clinic visits. In fairness, however, I should note that I was unavailable to see him for two months in 2015 due to my severe leg injury and post-operative recuperation. He had 4 visits in 2016 and 4 visits in 2017, but in 2018 he only had 3 visits. In 2019 he had 4 visits. In 2020 he had 2 visit. In 2021 he had two visits. After moving in with his sister and her family in January 2019, the adult supervision and support in [place]' life had substantially improved, but in the last year have  deteriorated.   3. Clinical course: During the past 15 years [name] has had multiple medical problems:  A. T1DM: His hemoglobin A1c values have been mostly in the 9.0-14.0% range. He has the following complications of diabetes: diabetic angiopathy, peripheral neuropathy, autonomic neuropathy, inappropriate sinus tachycardia, gastroparesis and postprandial abdominal bloating, chronic constipation, urinary  microalbuminuria, and occasional but significant hypoglycemia. Due to health insurance restrictions he has taken Novolog 70/30 Mix insulin at some times and Humalog 75/25 Mix insulin at other times.   B. Autoimmune hypothyroidism: In November 2007, the patient was diagnosed with hypothyroidism secondary to Hashimoto's disease and was started on Synthroid at a dose of 25 mcg per day.   C. Hypertension: In November 2008, he was diagnosed with hypertension and started on lisinopril, 5 mg per day.   D. Hypercholesterolemia: In July 2014 he was diagnosed with hypercholesterolemia and started on atorvastatin, 10 mg/day.  E. Noncompliance/medical neglect/inadequate medical supervision: Unfortunately, [name] has frequently missed BG checks, doses of insulin, and doses of his oral medications. Parental/family supervision and support have varied in consistency and in quality throughout these years. However, since [name] has been living with [name] and her family, the supervision and support have been much better.  [name] had an episode of chest pain and pallor on 11/05/18. He was admitted to [facility] where he was diagnosed with acute coronary syndrome. That episode scared him and caused him to try to be somewhat more compliant with his DM care plan.   G. On 09/09/19 he presented to the ED at [facility] with hyperglycemia, DKA, and dehydration. His CBG was 559. His serum glucose was 573, CO2 19, anion gap 19, BHOB 5.9% (ref 0.05-00.27), HbA1c 9.3%, urine glucose >500, urine ketones 80. He was treated with iv insulin per [facility]. He was discharged on 09/11/19. In retrospect, he had been visiting his grandmother in [place] for 3 days and had forgotten to bring his insulin with him. When he arrived home on 09/09/19 his mother recognized that he was sick and had severe ketonuria, so she gave him 29 units of 70/30 insulin and then immediately took him to the ED.   H. On 05/11/20 he was positive for SARS-COV-2. He had previously been  vaccinated on 09/24/19.  4. [name]' last [facility] clinic visit was on 08/05/2021.  At that visit we continued his Humalog Mix 75/25 insulin doses of 28 units in the mornings and 24 units in the evenings. I asked the family to supervise his BG checks and insulin doses more carefully and reliably.   A. In the interim he has been fairly healthy. His vision has improved and is not as blurry. He has his next follow up eye exam scheduled for June 2023.   B. [name] is sometimes supervised by his sister, [name], and sometimes by his mother. It appears that he is now receiving more supervision.  C. BGs have been "a lot better". He is having more low BGs than high BGs.    D. He has not been eating much.  He is not drinking many regular sodas  E. He has been walking more outside.     F. He continues on his morning 75/25 insulin dose of 28 units and his evening dose of 24 units.  He says he is not missing many insulin doses.    G. His dentures are working well.   H. He has both Medicaid and Medicare coverage, however, [place] [employer] will no longer pay for medications. He receives his  insulins and DM supplies [facility].   Sherrie GeorgeI.  [name] no longer scratches his skin frequently.   Ronita HippsJ. [name] is receiving his 25 mcg of Synthroid daily, 10 mg of lisinopril daily, and 10 mg of atorvastatin daily, when he takes them.   K. He no longer uses the [place] due to it coming out when he bumped into things.     5. Pertinent Review of Systems:  Constitutional: The patient feels "good overall". However, when he becomes hypoglycemic or extremely hyperglycemic, he can still be very irritable and sometimes even combative.  Eyes: As above. Vision is better. He is legally blind. He had his annual eye exam in June 2022. He was told that his diabetes is not interfering with his eyes, but he does have small cataracts. He will have his next exam in June 2023.  Neck: The patient has no complaints of anterior neck swelling,  soreness, tenderness, pressure, discomfort, or difficulty swallowing.  Heart: The patient has no other complaints of palpitations, irregular heat beats, chest pain, or chest pressure. Gastrointestinal: He does not usually have post-prandial bloating. He is no longer constipated. The patient has no other complaints of excessive hunger, acid reflux, stomach aches or pains, diarrhea, or constipation. Hands: No problems Legs: Muscle mass and strength seem normal. There are no other complaints of numbness, tingling, burning, or pain. No edema is noted. Feet: He says he no longer picks at his toe nails. There are no recent complaints of numbness, tingling, burning, or pain. No edema is noted. GU: He occasionally has nocturia. Hypoglycemia: He has had more low BGs in the past month.    .    6. BG meter review:  A. We have data from the past two weeks. Average BG was 198, compared with 389 at his last visit. BG range was 37-486, compared with 50-Hi at his last visit. Marland Kitchen. He had seven BGs <70: ranging from 37-69. Four of those low BGs occurred on the same afternoon. He had three BGS >400, ranging from 430-468.  B. Today he had 35% of his BGs in range, 53% above range, and 12% below range.    PAST MEDICAL, FAMILY, AND SOCIAL HISTORY:  Past Medical History:  Diagnosis Date   Angiopathy, diabetic (HCC)    Diabetes mellitus    Type 1, diagnosed age 36, with frequent DKA admissions, difficult to control due to MR   Diabetic nephropathy (HCC)    Started on Lisinopril 5 mg   Diabetic peripheral neuropathy (HCC)    Dysmorphic features    Goiter    Hypertension    Hypoglycemia associated with diabetes (HCC)    Hypothyroidism    Impaired cognition    mention of mental retardation   Mental retardation    Moderate or severe vision impairment, both eyes, impairment level not further specified    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   Thyroiditis, autoimmune     Family History  Problem  Relation Age of Onset   Vision loss Father    Retinitis pigmentosa Mother    Diabetes Maternal Grandmother        AODM   Diabetes Paternal Grandmother        AODM   Diabetes Cousin        Second cousin has juvenile-onset DM.     Current Outpatient Medications:    aspirin EC 81 MG tablet, Take 1 tablet (81 mg total) by mouth daily., Disp: 90 tablet,  Rfl: 3   atorvastatin (LIPITOR) 10 MG tablet, TAKE 1 TABLET BY MOUTH EVERY DAY, Disp: 90 tablet, Rfl: 1   Continuous Blood Gluc Sensor (FREESTYLE LIBRE 14 DAY SENSOR) MISC, Change every 14 days, Disp: 3 each, Rfl: 5   ibuprofen (ADVIL) 200 MG tablet, Take 200 mg by mouth every 6 (six) hours as needed for fever or moderate pain (throat pain)., Disp: , Rfl:    Insulin Lispro Prot & Lispro (HUMALOG MIX 75/25 KWIKPEN) (75-25) 100 UNIT/ML Kwikpen, INJECT 28 UNITS INTO THE SKIN IN THE MORNING AND 24 UNITS IN THE EVENING, Disp: 15 mL, Rfl: 5   Insulin Pen Needle (BD PEN NEEDLE MICRO U/F) 32G X 6 MM MISC, USE TO INJECT INSULIN 6 TIMES DAILY. AS NEEDED, Disp: 200 each, Rfl: 0   lisinopril (ZESTRIL) 10 MG tablet, TAKE 1 TABLET BY MOUTH EVERY DAY, Disp: 90 tablet, Rfl: 1   ONETOUCH VERIO test strip, CHECK BLOOD SUGAR 8 TIMES DAILY, Disp: 300 strip, Rfl: 5   oseltamivir (TAMIFLU) 75 MG capsule, Take 1 capsule (75 mg total) by mouth every 12 (twelve) hours., Disp: 10 capsule, Rfl: 0   SYNTHROID 25 MCG tablet, TAKE 1 TABLET BY MOUTH EVERY DAY BEFORE BREAKFAST, Disp: 90 tablet, Rfl: 1  Allergies as of 12/23/2021   (No Known Allergies)    1. Work and Family: [name] is working 5 days a week, 8 hours per day. [name]' friend, Gerlene Burdock, also usually lives in the house. When they are together, [name] and Gerlene Burdock help each other out, but have not been exercising much recently. Mom has gone back to work. Mom is also trying to care for her mother.   2. Activities: [name] has not been very active.  3. Smoking, alcohol, or drugs: No tobacco or alcohol or drugs.  4.  Primary Care Provider: He can't remember his new doctor's name.  REVIEW OF SYSTEMS: There are no other significant problems involving [name]'s other body systems.   Objective:  Vital Signs:  There were no vitals taken for this visit.   Ht Readings from Last 3 Encounters:  09/14/21  (1.626 m)  04/03/20  (1.626 m)  09/18/19  (1.626 m)   Wt Readings from Last 3 Encounters:  09/14/21 136 lb (61.7 kg)  08/05/21 140 lb 12.8 oz (63.9 kg)  06/03/21 134 lb 9.6 oz (61.1 kg)   There is no height or weight on file to calculate BSA.  Facility age limit for growth %iles is 20 years. Facility age limit for growth %iles is 20 years.  PHYSICAL EXAM:  Constitutional: The patient appears healthy, but his vision is still poor and his gait is more wide-based and shuffling due to his visual handicap. His weight has increased by 6 pounds. He was fairly engaged and interactive at his level today. He still does not really remember much about checking his BGs and taking his insulins. His thought processes are still very concrete. His insight remains poor. He really does not understand or comprehend much about diabetes, about why he needs to take care of himself, and about what will happen to him if he does not take care of himself.   Face: The face is somewhat dysmorphic. His face is very similar to mom's face and [name]'s face. Eyes: There is no obvious arcus or proptosis. His eyes are moist.  Mouth: The oropharynx appears normal. His geographic tongue has resolved. He no longer has any maxillary teeth, but he is wearing his new dentures. His  mouth is fairly moist.    Neck: The neck appears to be visibly normal. No carotid bruits are noted. The thyroid is a bit larger at 21 grams today. The consistency of the thyroid gland is normal today. The thyroid gland is not tender to palpation. Lungs: The lungs are clear to auscultation. Air movement is good. Heart: Heart rate is rapid, but rhythm  is regular. Heart sounds S1 and S2 are normal. I did not appreciate any pathologic cardiac murmurs. Abdomen: The abdomen is normal in size. Bowel sounds are normal. There is no obvious hepatomegaly, splenomegaly, or other mass effect.  Arms: Muscle size and bulk are low for age. He has several new areas of excoriation on his arms. The older areas have healed.  Hands: There is no obvious tremor. Phalangeal and metacarpophalangeal joints are normal. Palmar muscles are normal. Palmar skin is normal. Palmar moisture is also normal. Nail beds are mildly pallid.  Legs: Muscles appear normal for age. No edema is present. Feet: Feet are normally formed. DP and PT pulses are faint 1+ bilaterally.    Neurologic: Strength is low-normal for age in both the upper and lower extremities. Muscle tone is normal. Sensation to touch is normal in both the legs and feet.    LAB DATA:   Labs 08/05/21: HbA1c 9.0%, CBG 248; CBC abnormal with RBC 4.16 (ref 4.20-5.80) and Hgb 12.9 (ref 13.2-17.1) , iron 102 (ref 50-180)  Labs 06/03/21: HbA1c 13.8%, CBG 274  Labs 03/02/21: HbA1c 12.9%, CBG 486; urine ketones negative; TSH 2.34, free T4 1.1, free T3 3.2; CMP normal, except glucose 367, BUN 26, albumin 3.5 (ref 3.6-5.1); cholesterol 137, triglycerides 83, HDL 46, LDL 86; urinary microalbumin/creatinine ratio 1,026  Labs 09/26/20: HbA1c >14%, CBG 364  Labs 09/13/19: CBG 406; urine glucose positive, urine ketones negative; He apparently did not take his AM insulin until later this afternoon.   Labs 09/10/19: HbA1c 9.3%  Labs 06/15/19: HbA1c 9.0%, CBG 196; TSH 1.98, free T4 1.2, free T3 3.4; CMP normal; CBC normal, cholesterol 137, triglycerides 96, HDL 46, LDL 73; urinary microalbumin/creatinine ratio 1,599  Labs 12/14/18: HbA1c 9.7%, CBG 304  Labs 08/01/18: HbA1c 8.9%, CBG 89  Labs 02/23/18: HbA1c 8.8%, CBG 201; TSH 5.00, free T4 1.1, free T3 3.2;   Labs 11/23/17: CBG 338  Labs 09/26/17: HbA1c 10.9%, CBG 364, urine glucose  2000, urine ketones negative  Labs 06/27/17: HbA1c 9.0%, CBG 134; TSH 1.49, free T4 1.2, free T3 3.1; CMP normal, except for glucose of 213; cholesterol 134, triglycerides 79, HDL 53, LDL 65; urinary microalbumin/creatinine ratio was 748   Labs 12/31/15: HbA1c 11.7%, CBG 145  Labs 09/29/16: HbA1c 10.2%, CBG 70  Labs 06/01/16: HbA1c >14%. CBG 288; TSH 2.65, free T4 1.2, free T3 2.7; cholesterol 201, triglycerides 106, HDL 54, LDL 127; CMP normal except glucose 130 and albumin 3.5.   Labs 01/05/16: HbA1c 11.0%  Labs 11/04/15: HbA1c 10.5%  Labs 09/12/15: HbA1c 10.3%; CMP normal except for glucose 149 and sodium 133; cholesterol 190, triglycerides 90, HDL 55, LDL 117; urinary microalbumin/creatinine ratio 190; TSH 3.09, free T4 1.3, free T3 3.8  Labs 07/16/15: HbA1c 10.6%, Urine dipstick shows trace ketones  Labs 04/09/15: HbA1c 10.4%  Labs 09/09/14: HbA1c 11.9%; TSH 1.822, free T4 1.02, and free T3 2.6; CMP normal except for glucose of 273; cholesterol 174, triglycerides 181, HDL 46, LDL 92; urinary microalbumin/creatinine ratio 139.2  Labs 06/19/14: HbA1c 10.7%  Labs 05/12/14: CMP normal  Labs 02/19/14: HbA1c 10.2%  Labs  07/31/13: HbA1c 8.5%; CMP normal, except glucose 269; microalbumin/creatinine ratio 51.6; TSH 1.885, free T4 1.31, free T3 3.4   Labs 7/018/14: CMP normal, except glucose of 14; cholesterol 202, triglycerides 85, HDL 58, LDL 127; urinary microalbumin/creatinine ratio 51.9; TSH 2.993, free T4 1.32, free T3 2.8  05/10/12: Creatinine 0.62, TSH 1.025,            Assessment and Plan:   ASSESSMENT:  1-2. Diabetes mellitus/hypoglycemia:   A. The patient's BG control is much improved due to taking his insulins more reliably and to taking in fewer carbs, especially liquid carbs. Most of his BGs are still too high, but he has more normal BGs and fewer higher  BGs. It appears that [name] is frequently missing his insulin doses or not taking the full doses.   B.  He is having more  frequent low BGs, but 4/7 occurred on the same day. Unfortunately, neither he nor dad can remember what happened that day. When he takes insulin and does not eat enough, the BGs can drop.  [name] is now receiving more adult supervision of his BG checks and insulin doses. He is incapable of consistently and intelligently taking care of himself and his T1DM.  3. Hypertension: Blood pressure is good today.  He needs adult supervision of all of his oral medication.  4. Peripheral neuropathy: This problem has improved and is not evident today, but can certainly recur if he does not get his BGs under better control.   5. Angiopathy: His angiopathy is still fairly severe. He needs to walk daily. If he exercises on a regular basis and controls his BGs, the blood flow in his legs and feet will likely improve. 6. Goiter/thyroiditis: Thyroid gland has increased in size again, c/w a new flare up of thyroiditis. The process of waxing and waning of thyroid gland size is c/w evolving Hashimoto's thyroiditis.   7. Hypothyroid:  A. His TFTs have fluctuated through the years, in part due to whether or not he was taking his thyroid hormone.  B. His TFTs in October 2022 were normal, but lower. He needs to take his Synthroid daily. He needs adult supervision.  8. Autonomic neuropathy, tachycardia, bloating, and gastroparesis: His heart rate is normal and he is not having any GI symptoms.   9. Combined hyperlipidemia: His lipids in February 2019 , in January 2021, and October 2022 were normal on his current dose of atorvastatin.  10. Weight loss, unintentional: He has gained weight paralleling his increase in taking insulin.    11. Dehydration: He is not dehydrated today.  12. Medical neglect: [name]' family is doing better.  13. 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  14. Microalbuminuria/macroalbuminuria: His microalbumin/creatinine ratio in February 2019 was the highest that it has been in the prior four nears.  His ratio in January 2021 was even higher. His ratio in October 2022 was lower, but still too high. He needs better control of both his BGs and his BPs. He still needs to take his lisinopril every day.   15. Acute coronary syndrome: This event scared [name]. Since [name] does not have a PCP, I referred him to [facility] for further E&M. He has not had a recurrence. He missed his cardiology appointment, so needs to re-schedule. 16. Pallor of nail beds: His CBC in January 2021 was normal. His CBC on 09/10/19 had normal RBC indices. We need to check his CBC and iron  PLAN:  1. Diagnostic: HbA1c, CBG, CBC,  and iron today. Family will call me in two weeks with BG data. 2. Therapeutic:   A. Continue  the Humalog Mix 75/25 insulin dose in the mornings of 28 units. Continue  the dinner insulin dose of 24 units. However, if the morning BG is <100, reduce the morning insulin dose to 26 units. If the dinner BG is <100, reduce the dinner insulin dose to 23 units. If the BG at bedtime is <200, follow the Small column bedtime snack plan.    B. Take oral medications once a day, at whatever time the family can fit in the doses.   C. I asked that [name] and mother directly supervise his BG checks, insulin dosing, and oral meds.  3. Patient education: We discussed all of the above. I asked [name] again to please cooperate with his family members. 4. Follow-up: 3 months. Ensure that [employer] and/or mother can attend.   Level of Service: This visit lasted in excess of 60 minutes. More than 50% of the visit was devoted to counseling.  Molli Knock, MD 12/23/2021 12:00 AM

## 2021-12-24 ENCOUNTER — Encounter (INDEPENDENT_AMBULATORY_CARE_PROVIDER_SITE_OTHER): Payer: Self-pay

## 2022-01-04 ENCOUNTER — Telehealth (INDEPENDENT_AMBULATORY_CARE_PROVIDER_SITE_OTHER): Payer: Self-pay

## 2022-01-04 NOTE — Telephone Encounter (Signed)
Lvm with call back number

## 2022-01-04 NOTE — Telephone Encounter (Signed)
-----   Message from Michael J Brennan, MD sent at 01/03/2022  5:25 PM EDT ----- CBC was abnormal in that the RBC count ad hemoglobin were low, possibly c/w the diagnosis of anemia due to chronic disease. . Iron was normal.   Clinical staff: Please order a CBC, iron, B12 level, B1 level,  CMP, TSH, free T4 , free T3, lipid panel, and urinary microalbumin/creatinine test to be done ideally 1-2 weeks before his next visit on 01/19/22. If they can't come in early for lab tests, we will do the tests that day. Thanks. Dr. Brennan  

## 2022-01-08 ENCOUNTER — Encounter (INDEPENDENT_AMBULATORY_CARE_PROVIDER_SITE_OTHER): Payer: Self-pay

## 2022-01-08 ENCOUNTER — Telehealth (INDEPENDENT_AMBULATORY_CARE_PROVIDER_SITE_OTHER): Payer: Self-pay

## 2022-01-08 NOTE — Telephone Encounter (Signed)
LVM with call back number.

## 2022-01-08 NOTE — Telephone Encounter (Signed)
-----   Message from David Stall, MD sent at 01/03/2022  5:25 PM EDT ----- CBC was abnormal in that the RBC count ad hemoglobin were low, possibly c/w the diagnosis of anemia due to chronic disease. . Iron was normal.   Clinical staff: Please order a CBC, iron, B12 level, B1 level,  CMP, TSH, free T4 , free T3, lipid panel, and urinary microalbumin/creatinine test to be done ideally 1-2 weeks before his next visit on 01/19/22. If they can't come in early for lab tests, we will do the tests that day. Thanks. Dr. Fransico Michael

## 2022-01-18 NOTE — Progress Notes (Signed)
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  HISTORY OF PRESENT ILLNESS:   [name] is a 36 y.o. Caucasian gentleman.  [name] was accompanied by his [name].        1. I first consulted on [name] when he was admitted to the Pediatric Ward at the [facility] in May 2007.  A. The patient was diagnosed with T1DM at age 72 in about 2001 or 2002. We have few details about his medical care until 2007. We know that in about 2006 he saw a pediatric endocrinologist at [facility], [facility], [facility]. Unfortunately, the family chose not to return to see the endocrinologist. In about April of 2007, his primary care provider, Dr. Reed Breech of [facility], started the patient on an insulin pump. Unfortunately, neither the patient nor the family were capable of utilizing the pump's technology effectively.   B. On 10/02/2005 the patient was admitted to the [facility] for poorly controlled type 1 diabetes. I consulted on the patient at that time. 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 He did not have the insight or intelligence to perform the daily tasks of diabetes self-care independently or to utilize the insulin pump. It also appeared that there was not a great deal of adult supervision and support within his family. We tried to teach him and his family how to follow a multiple daily injection of insulin plan with Lantus and Novolog insulin, but that attempt failed rapidly. I changed his regimen to  Novolog Mix 70/30 insulin taken twice daily. He was to take 26 units each morning at breakfast and 14 units each evening at supper. I also gave him a sliding scale for Humalog lispro with different doses of insulin to be used at breakfast, lunch, supper, and bedtime in addition to his 70/30 insulin when his family members were available to check the blood sugars and give the additional insulin. The patient's family members agreed to supervise his care.  2. Unfortunately, [name] has frequently not had the adult supervision and support that he needed. In the last 15 years, the patient has been scheduled to return to our clinic for follow up visits approximately every 3 months, but has actually only returned to our clinic for follow up visits 1-4 times per year, although the family has done much better in the past three years. In 2012 he had 3 visits and was admitted once. In 2013 he had one admission in December, followed by a clinic visit later that month. In 2014 he had 4 clinic visits. In 2015 he had two clinic visits. In fairness, however, I should note that I was unavailable to see him for two months in 2015 due to my severe leg injury and post-operative recuperation. He had 4 visits in 2016 and 4 visits in 2017, but in 2018 he only had 3 visits. In 2019 he had 4 visits. In 2020 he had 2 visit. In 2021 he had two visits. After moving in with his sister and her family in January 2019, the adult supervision and support in [place] life had substantially improved, but in the last year have deteriorated.  3. Clinical course: During the past 16 years [name] has had multiple medical problems:  A. T1DM: His hemoglobin A1c values have been mostly in the 9.0-14.0% range. He has the following complications of diabetes: diabetic angiopathy, peripheral neuropathy, autonomic neuropathy, inappropriate sinus tachycardia, gastroparesis and postprandial abdominal bloating, chronic constipation, urinary microalbuminuria, and  occasional but significant hypoglycemia. Due to health insurance restrictions he has taken Novolog 70/30 Mix insulin at some times and Humalog 75/25 Mix insulin at other times.   B. Autoimmune hypothyroidism: In November 2007, the patient was diagnosed with hypothyroidism secondary to Hashimoto's disease and was started on Synthroid at a dose of 25 mcg per day.   C. Hypertension: In November 2008, he was diagnosed with hypertension and started on lisinopril, 5 mg per day.   D. Hypercholesterolemia: In July 2014 he was diagnosed with hypercholesterolemia and started on atorvastatin, 10 mg/day.  E. Noncompliance/medical neglect/inadequate medical supervision: Unfortunately, [name] has frequently missed BG checks, doses of insulin, and doses of his oral medications. Parental/family supervision and support have varied in consistency and in quality throughout these years. However, since [name] has been living with [name] and her family, the supervision and support have been much better.  [name] had an episode of chest pain and pallor on 11/05/18. He was admitted to [facility] where he was diagnosed with acute coronary syndrome. That episode scared him and caused him to try to be somewhat more compliant with his DM care plan.   G. On 09/09/19 he presented to the ED at [facility] with hyperglycemia, DKA, and dehydration. His CBG was 559. His serum glucose was 573, CO2 19, anion gap 19, BHOB 5.9% (ref 0.05-00.27), HbA1c 9.3%, urine glucose >500, urine ketones 80. He was treated with iv insulin per [facility]. He was discharged on 09/11/19. In retrospect, he had been visiting his grandmother in [place] for 3 days and had forgotten to bring his insulin with him. When he arrived home on 09/09/19 his [name] recognized that he was sick and had severe ketonuria, so she gave him 29 units of 70/30 insulin and then immediately took him to the ED.   H. On 05/11/20 he was positive for SARS-COV-2. He had previously been vaccinated on  09/24/19.  4. [name]' last [facility] clinic visit was on 08/05/2021.  At that visit we continued his Humalog Mix 75/25 insulin doses of 28 units in the mornings and 24 units in the evenings. I asked the family to supervise his BG checks and insulin doses more carefully and reliably.  A. In the interim he has been fairly healthy.   [name] is sometimes supervised by his sister, [name], and sometimes by his [name]. It appears that he is now receiving more supervision.  C. BGs have been "up and down like a roller coaster, usually lower late at night and in the mornings, and higher during the day.   D. He has not been eating much.  He is not drinking many regular sodas  E. He has been walking and using a Actuary.    F. He continues on his morning 75/25 insulin dose of 28 units and his evening dose of 24 units.  He says he is not missing many insulin doses.    G. His dentures are working well.   H. He has both Medicaid and Medicare coverage, however, [place] Medicaid will no longer pay for medications. He receives his insulins and DM supplies through [facility].   Sherrie George no  longer scratches his skin frequently.   [name] is receiving his 25 mcg of Synthroid daily, 10 mg of lisinopril daily, and 10 mg of atorvastatin daily, but he sometimes forgets. when he takes them.   K. He no longer uses the [place] due to it coming out when he bumped into things.    L He had a cardiology consult by Mr. Eula ListenRyan Dunn, PA, on 09/14/21.  [name]' BP was 118/80. His ECG was normal. His lipid panel results were essentially normal. He will have a follow up visit in 12 months.  5. Pertinent Review of Systems:  Constitutional: The patient feels "good overall". Mom says that he has more energy, is less tired, and is more physically active. However, when he becomes hypoglycemic or extremely hyperglycemic, he can still be very irritable and sometimes even combative.  Eyes: As above. Vision may be unchanged or a bit  worse. better. He is legally blind. He had his annual eye exam in June 2022. He was told that his diabetes is not interfering with his eyes, but he does have small cataracts. He will have his next exam in September 2023.  Neck: The patient has no complaints of anterior neck swelling, soreness, tenderness, pressure, discomfort, or difficulty swallowing.  Heart: As above. [name] has no other complaints of palpitations, irregular heat beats, chest pain, or chest pressure. Gastrointestinal: He does not usually have post-prandial bloating. He is no longer constipated. The patient has no other complaints of excessive hunger, acid reflux, stomach aches or pains, diarrhea, or constipation. Hands: No problems Legs: Muscle mass and strength seem normal. There are no other complaints of numbness, tingling, burning, or pain. No edema is noted. Feet: He says he no longer picks at his toe nails. There are no recent complaints of numbness, tingling, burning, or pain. No edema is noted. GU: He occasionally has nocturia in the early mornings  Hypoglycemia: He has had more low BGs in the past month, a 46 at 2 AM this morning.    .    6. BG meter review:  A. We have data from the past two weeks. He checks BGs 2-5 times per day, average 3 times. Average BG was 214, compared with 198 at his last visit. BG range was 46-470, compared with 37-486 at his last visit. Morning BGs vary from 46-281, but most are <100. He had six BGs <70, 5 of which occurred during the middle of the night or at breakfast and 1 of which occurred at dinner. He had six BGS >400, all at dinner. If he is physically active during the day, however, his BGs may be quite normal at dinner.   B. Today he had 27% of his BGs in range, 54% above range, and 19% below range,compared with  35% of his BGs in range, 53% above range, and 12% below range.    PAST MEDICAL, FAMILY, AND SOCIAL HISTORY:  Past Medical History:  Diagnosis Date   Angiopathy, diabetic (HCC)     Diabetes mellitus    Type 1, diagnosed age 36, with frequent DKA admissions, difficult to control due to MR   Diabetic nephropathy (HCC)    Started on Lisinopril 5 mg   Diabetic peripheral neuropathy (HCC)    Dysmorphic features    Goiter    Hypertension    Hypoglycemia associated with diabetes (HCC)    Hypothyroidism    Impaired cognition    mention of mental retardation   Mental retardation    Moderate or  severe vision impairment, both eyes, impairment level not further specified    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     Current Outpatient Medications:    aspirin EC 81 MG tablet, Take 1 tablet (81 mg total) by mouth daily., Disp: 90 tablet, Rfl: 3   atorvastatin (LIPITOR) 10 MG tablet, TAKE 1 TABLET BY MOUTH EVERY DAY, Disp: 90 tablet, Rfl: 1   Continuous Blood Gluc Sensor (FREESTYLE LIBRE 14 DAY SENSOR) MISC, Change every 14 days, Disp: 3 each, Rfl: 5   ibuprofen (ADVIL) 200 MG tablet, Take 200 mg by mouth every 6 (six) hours as needed for fever or moderate pain (throat pain)., Disp: , Rfl:    Insulin Lispro Prot & Lispro (HUMALOG MIX 75/25 KWIKPEN) (75-25) 100 UNIT/ML Kwikpen, INJECT 28 UNITS INTO THE SKIN IN THE MORNING AND 24 UNITS IN THE EVENING, Disp: 15 mL, Rfl: 5   Insulin Pen Needle (BD PEN NEEDLE MICRO U/F) 32G X 6 MM MISC, USE TO INJECT INSULIN 6 TIMES DAILY. AS NEEDED, Disp: 200 each, Rfl: 0   lisinopril (ZESTRIL) 10 MG tablet, TAKE 1 TABLET BY MOUTH EVERY DAY, Disp: 90 tablet, Rfl: 1   ONETOUCH VERIO test strip, CHECK BLOOD SUGAR 8 TIMES DAILY, Disp: 300 strip, Rfl: 5   oseltamivir (TAMIFLU) 75 MG capsule, Take 1 capsule (75 mg total) by mouth every 12 (twelve)  hours., Disp: 10 capsule, Rfl: 0   SYNTHROID 25 MCG tablet, TAKE 1 TABLET BY MOUTH EVERY DAY BEFORE BREAKFAST, Disp: 90 tablet, Rfl: 1  Allergies as of 01/19/2022   (No Known Allergies)    1. Work and Family: [name] is working 5 days a week, 8 hours per day, at [employer]. [name]' friend, [name], also usually lives in the house. When they are together, [name] and [name] help each other out, but have not been exercising much recently. Mom has gone back to work. Mom is also trying to care for her [name].   2. Activities: [name] has not been very active.  3. Smoking, alcohol, or drugs: No tobacco or alcohol or drugs.  4. Primary Care Provider: He will be moving to [facility].   REVIEW OF SYSTEMS: There are no other significant problems involving Kourtney's other body systems.   Objective:  Vital Signs:  BP 122/78   Pulse 81   Wt 139 lb 6.4 oz (63.2 kg)   BMI 23.93 kg/m    Ht Readings from Last 3 Encounters:  09/14/21  (1.626 m)  04/03/20  (1.626 m)  09/18/19  (1.626 m)   Wt Readings from Last 3 Encounters:  01/19/22 139 lb 6.4 oz (63.2 kg)  09/14/21 136 lb (61.7 kg)  08/05/21 140 lb 12.8 oz (63.9 kg)   Body surface area is 1.69 meters squared.  Facility age limit for growth %iles is 20 years. Facility age limit for growth %iles is 20 years.  PHYSICAL EXAM:  Constitutional: The patient appears healthy, but his vision is still poor and his gait is more wide-based and  shuffling due to his visual handicap. His weight has decreased by 1 pound. He was fairly engaged and interactive at his level today. He still does not really understand or remember much about checking his BGs and taking his insulins. His thought processes are still very concrete. His insight remains poor. He really does not understand or comprehend much about diabetes, about why he needs to take care of himself, and about what will happen to him if he does not  take care of himself.   Face: The face is somewhat dysmorphic. His face is very similar to mom's face and [name]'s face. Eyes: There is no obvious arcus or proptosis. His eyes are moist. His vision appears to be worse, manifested by his difficulty in walking and his need to hold onto walls in order to walk forward. Mouth: The oropharynx appears normal. His geographic tongue has resolved. He no longer has any maxillary teeth, but he is wearing his new dentures. His mouth is fairly moist.    Neck: The neck appears to be visibly normal. No carotid bruits are noted. The thyroid is again a bit enlarged, but smaller, at 20+ grams today. The consistency of the thyroid gland is normal today. The thyroid gland is not tender to palpation. Lungs: The lungs are clear to auscultation. Air movement is good. Heart: As above. Heart rate is rapid, but rhythm is regular. Heart sounds S1 and S2 are normal. I did not appreciate any pathologic cardiac murmurs. Abdomen: The abdomen is normal in size. Bowel sounds are normal. There is no obvious hepatomegaly, splenomegaly, or other mass effect.  Arms: Muscle size and bulk are low for age. He has several new areas of excoriation on his arms. The older areas have healed.  Hands: There is no obvious tremor. Phalangeal and metacarpophalangeal joints are normal. Palmar muscles are normal. Palmar skin is normal. Palmar moisture is also normal. Nail beds are mildly pallid.  Legs: Muscles appear normal for age. No edema is present. Feet: Feet are normally formed. DP and PT pulses are faint bilaterally.    Neurologic: Strength is low-normal for age in both the upper and lower extremities. Muscle tone is normal. Sensation to touch is normal in both the legs and feet.    LAB DATA:   Labs 01/19/22: HbA1c 9.3%, CBG 104  Labs 08/05/21: HbA1c 9.0%, CBG 248; CBC abnormal with RBC 4.16 (ref 4.20-5.80) and Hgb 12.9 (ref 13.2-17.1) , iron 102 (ref 50-180)  Labs 06/03/21: HbA1c 13.8%, CBG  274  Labs 03/02/21: HbA1c 12.9%, CBG 486; urine ketones negative; TSH 2.34, free T4 1.1, free T3 3.2; CMP normal, except glucose 367, BUN 26, albumin 3.5 (ref 3.6-5.1); cholesterol 137, triglycerides 83, HDL 46, LDL 86; urinary microalbumin/creatinine ratio 1,026  Labs 09/26/20: HbA1c >14%, CBG 364  Labs 09/13/19: CBG 406; urine glucose positive, urine ketones negative; He apparently did not take his AM insulin until later this afternoon.   Labs 09/10/19: HbA1c 9.3%  Labs 06/15/19: HbA1c 9.0%, CBG 196; TSH 1.98, free T4 1.2, free T3 3.4; CMP normal; CBC normal, cholesterol 137, triglycerides 96, HDL 46, LDL 73; urinary microalbumin/creatinine ratio 1,599  Labs 12/14/18: HbA1c 9.7%, CBG 304  Labs 08/01/18: HbA1c 8.9%, CBG 89  Labs 02/23/18: HbA1c 8.8%, CBG 201; TSH 5.00, free T4 1.1, free T3 3.2;   Labs 11/23/17: CBG 338  Labs 09/26/17: HbA1c 10.9%, CBG 364, urine glucose 2000, urine ketones negative  Labs 06/27/17: HbA1c 9.0%, CBG 134; TSH 1.49, free T4 1.2, free T3 3.1;  CMP normal, except for glucose of 213; cholesterol 134, triglycerides 79, HDL 53, LDL 65; urinary microalbumin/creatinine ratio was 748   Labs 12/31/15: HbA1c 11.7%, CBG 145  Labs 09/29/16: HbA1c 10.2%, CBG 70  Labs 06/01/16: HbA1c >14%. CBG 288; TSH 2.65, free T4 1.2, free T3 2.7; cholesterol 201, triglycerides 106, HDL 54, LDL 127; CMP normal except glucose 130 and albumin 3.5.   Labs 01/05/16: HbA1c 11.0%  Labs 11/04/15: HbA1c 10.5%  Labs 09/12/15: HbA1c 10.3%; CMP normal except for glucose 149 and sodium 133; cholesterol 190, triglycerides 90, HDL 55, LDL 117; urinary microalbumin/creatinine ratio 190; TSH 3.09, free T4 1.3, free T3 3.8  Labs 07/16/15: HbA1c 10.6%, Urine dipstick shows trace ketones  Labs 04/09/15: HbA1c 10.4%  Labs 09/09/14: HbA1c 11.9%; TSH 1.822, free T4 1.02, and free T3 2.6; CMP normal except for glucose of 273; cholesterol 174, triglycerides 181, HDL 46, LDL 92; urinary microalbumin/creatinine ratio  139.2  Labs 06/19/14: HbA1c 10.7%  Labs 05/12/14: CMP normal  Labs 02/19/14: HbA1c 10.2%  Labs 07/31/13: HbA1c 8.5%; CMP normal, except glucose 269; microalbumin/creatinine ratio 51.6; TSH 1.885, free T4 1.31, free T3 3.4   Labs 7/018/14: CMP normal, except glucose of 14; cholesterol 202, triglycerides 85, HDL 58, LDL 127; urinary microalbumin/creatinine ratio 51.9; TSH 2.993, free T4 1.32, free T3 2.8  05/10/12: Creatinine 0.62, TSH 1.025,            Assessment and Plan:   ASSESSMENT:  1-2. Diabetes mellitus/hypoglycemia:   A. The patient's BG control is much improved compared to 2022, but is higher now than in March 2023. [name] is having more BGs >400 in the late afternoons and evenings, and more BGS <70 in the mornings. However, when he is physically active during the day his BGs are pretty good during the afternoons and evenings.   [name] needs more insulin in the mornings and less insulin in the evenings, during most days Monday-Friday. However when he is more active on weekends, he needs 1-2 units less insulin in the mornings.  [name] is now receiving more adult supervision of his BG checks and insulin doses. He is incapable of consistently and intelligently taking care of himself and his T1DM.  3. Hypertension: Blood pressure is good today.  He needs adult supervision of all of his oral medication.  4. Peripheral neuropathy: This problem has improved and is not evident today, but can certainly recur if he does not get his BGs under better control.   5. Angiopathy: His angiopathy is still fairly severe. He needs to walk daily. If he exercises on a regular basis and controls his BGs, the blood flow in his legs and feet will likely improve. 6. Goiter/thyroiditis: Thyroid gland has decreased in size a bit, c/w less thyroiditis. The process of waxing and waning of thyroid gland size is c/w evolving Hashimoto's thyroiditis.   7. Hypothyroid:  A. His TFTs have fluctuated through the  years, in part due to whether or not he was taking his thyroid hormone.  B. His TFTs in October 2022 were normal, but lower. He needs to take his Synthroid daily. He needs adult supervision.  8. Autonomic neuropathy, tachycardia, bloating, and gastroparesis: His heart rate is normal and he is not having any GI symptoms.   9. Combined hyperlipidemia: His lipids in February 2019 , in January 2021, and October 2022 were normal on his current dose of atorvastatin.  10. Weight loss, unintentional: He has  lost one pound.  11. Dehydration: He is not dehydrated today.  12. Medical neglect: [name]' family is doing better.  13. 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  14. Microalbuminuria/microalbuminuria: His microalbumin/creatinine ratio in February 2019 was the highest that it has been in the prior four nears. His ratio in January 2021 was even higher. His ratio in October 2022 was lower, but still too high. He needs better control of both his BGs and his BPs. He still needs to take his lisinopril every day.   15. Acute coronary syndrome: As above. This event scared [name]. Since [name] does not have a PCP, I referred him to [facility] for further E&M. He has not had a recurrence. He missed his cardiology appointment, so needs to re-schedule. 16. Pallor of nail beds: His CBC in January 2021 was normal. His CBC on 09/10/19 had normal RBC indices. In march 2023 his RBC count and hemoglobin were a bit low, but his iron was quite good at 102. He appears to have mild anemia of chronic disease.  PLAN:  1. Diagnostic: HbA1c, CBG, TFTs today. Family will call me in two weeks with BG data. 2. Therapeutic:   A. Change  the Humalog Mix 75/25 insulin dose in the mornings to 30 units. Change  the dinner insulin dose to 22 units. However, if the morning BG is <100, reduce the morning insulin dose to 28 units. If the dinner BG is <100, reduce the dinner insulin dose to 20 units. If the BG at bedtime is  <200, follow the Small column bedtime snack plan.    B. Take oral medications once a day, at whatever time the family can fit in the doses.   C. I asked that [name] directly supervise his BG checks, insulin dosing, and oral meds.  3. Patient education: We discussed all of the above. I asked [name] again to please cooperate with his family members. 4. Follow-up: 3 months if possible at [facility].   Level of Service: This visit lasted in excess of 55 minutes. More than 50% of the visit was devoted to counseling.  Molli Knock, MD 01/19/2022 11:41 AM

## 2022-01-19 ENCOUNTER — Encounter (INDEPENDENT_AMBULATORY_CARE_PROVIDER_SITE_OTHER): Payer: Self-pay | Admitting: "Endocrinology

## 2022-01-19 ENCOUNTER — Ambulatory Visit (INDEPENDENT_AMBULATORY_CARE_PROVIDER_SITE_OTHER): Payer: Medicare Other | Admitting: "Endocrinology

## 2022-01-19 VITALS — BP 122/78 | HR 81 | Wt 139.4 lb

## 2022-01-19 DIAGNOSIS — E10649 Type 1 diabetes mellitus with hypoglycemia without coma: Secondary | ICD-10-CM

## 2022-01-19 DIAGNOSIS — E782 Mixed hyperlipidemia: Secondary | ICD-10-CM

## 2022-01-19 DIAGNOSIS — I10 Essential (primary) hypertension: Secondary | ICD-10-CM

## 2022-01-19 DIAGNOSIS — E1065 Type 1 diabetes mellitus with hyperglycemia: Secondary | ICD-10-CM | POA: Diagnosis not present

## 2022-01-19 DIAGNOSIS — E063 Autoimmune thyroiditis: Secondary | ICD-10-CM | POA: Diagnosis not present

## 2022-01-19 DIAGNOSIS — R809 Proteinuria, unspecified: Secondary | ICD-10-CM

## 2022-01-19 DIAGNOSIS — E049 Nontoxic goiter, unspecified: Secondary | ICD-10-CM

## 2022-01-19 DIAGNOSIS — H547 Unspecified visual loss: Secondary | ICD-10-CM

## 2022-01-19 DIAGNOSIS — D638 Anemia in other chronic diseases classified elsewhere: Secondary | ICD-10-CM

## 2022-01-19 LAB — POCT GLYCOSYLATED HEMOGLOBIN (HGB A1C): Hemoglobin A1C: 9.3 % — AB (ref 4.0–5.6)

## 2022-01-19 LAB — POCT GLUCOSE (DEVICE FOR HOME USE): POC Glucose: 104 mg/dl — AB (ref 70–99)

## 2022-01-19 NOTE — Patient Instructions (Addendum)
No further follow up here.  Please increase the morning dose of Humalog Mix 75/25 to 30 units on most workdays, but reduce the dose to 28 units if the morning BG is <100 or if he plans to be more physically active during the day, for example, on weekends and holidays.  Please reduce the Humalog 75/25 dose in the evening to 22 units. If the dinner BG is <100, please reduce the dinner insulin dose to 20 units.   At [facility], we are committed to providing exceptional care. You will receive a patient satisfaction survey through text or email regarding your visit today. Your opinion is important to me. Comments are appreciated.

## 2022-01-20 LAB — LIPID PANEL
Cholesterol: 203 mg/dL — ABNORMAL HIGH (ref <200)
HDL: 58 mg/dL (ref >=40)
LDL Cholesterol (Calc): 113 mg/dL (calc) — ABNORMAL HIGH
Non-HDL Cholesterol (Calc): 145 mg/dL (calc) — ABNORMAL HIGH (ref <130)
Total CHOL/HDL Ratio: 3.5 (calc) (ref <5.0)
Triglycerides: 196 mg/dL — ABNORMAL HIGH (ref <150)

## 2022-01-20 LAB — CBC WITH DIFFERENTIAL/PLATELET
Absolute Monocytes: 309 cells/uL (ref 200–950)
Basophils Absolute: 32 cells/uL (ref 0–200)
Basophils Relative: 0.5 %
Eosinophils Absolute: 151 cells/uL (ref 15–500)
Eosinophils Relative: 2.4 %
HCT: 40.7 % (ref 38.5–50.0)
Hemoglobin: 13.7 g/dL (ref 13.2–17.1)
Lymphs Abs: 1266 cells/uL (ref 850–3900)
MCH: 32 pg (ref 27.0–33.0)
MCHC: 33.7 g/dL (ref 32.0–36.0)
MCV: 95.1 fL (ref 80.0–100.0)
MPV: 10.9 fL (ref 7.5–12.5)
Monocytes Relative: 4.9 %
Neutro Abs: 4542 cells/uL (ref 1500–7800)
Neutrophils Relative %: 72.1 %
Platelets: 215 10*3/uL (ref 140–400)
RBC: 4.28 10*6/uL (ref 4.20–5.80)
RDW: 12.4 % (ref 11.0–15.0)
Total Lymphocyte: 20.1 %
WBC: 6.3 10*3/uL (ref 3.8–10.8)

## 2022-01-20 LAB — COMPREHENSIVE METABOLIC PANEL
AG Ratio: 1.4 (calc) (ref 1.0–2.5)
ALT: 14 U/L (ref 9–46)
AST: 17 U/L (ref 10–40)
Albumin: 3.3 g/dL — ABNORMAL LOW (ref 3.6–5.1)
Alkaline phosphatase (APISO): 60 U/L (ref 36–130)
BUN: 17 mg/dL (ref 7–25)
CO2: 28 mmol/L (ref 20–32)
Calcium: 9 mg/dL (ref 8.6–10.3)
Chloride: 105 mmol/L (ref 98–110)
Creat: 1.12 mg/dL (ref 0.60–1.26)
Globulin: 2.3 g/dL (calc) (ref 1.9–3.7)
Glucose, Bld: 209 mg/dL — ABNORMAL HIGH (ref 65–139)
Potassium: 5 mmol/L (ref 3.5–5.3)
Sodium: 141 mmol/L (ref 135–146)
Total Bilirubin: 0.4 mg/dL (ref 0.2–1.2)
Total Protein: 5.6 g/dL — ABNORMAL LOW (ref 6.1–8.1)

## 2022-01-20 LAB — IRON: Iron: 72 ug/dL (ref 50–180)

## 2022-01-20 LAB — MICROALBUMIN / CREATININE URINE RATIO
Creatinine, Urine: 118 mg/dL (ref 20–320)
Microalb Creat Ratio: 3267 mcg/mg creat — ABNORMAL HIGH (ref <30)
Microalb, Ur: 385.5 mg/dL

## 2022-01-20 LAB — T3, FREE: T3, Free: 3.1 pg/mL (ref 2.3–4.2)

## 2022-01-20 LAB — TSH: TSH: 5.04 mIU/L — ABNORMAL HIGH (ref 0.40–4.50)

## 2022-01-20 LAB — T4, FREE: Free T4: 1 ng/dL (ref 0.8–1.8)

## 2022-02-13 ENCOUNTER — Other Ambulatory Visit (INDEPENDENT_AMBULATORY_CARE_PROVIDER_SITE_OTHER): Payer: Self-pay | Admitting: "Endocrinology

## 2022-02-13 DIAGNOSIS — E063 Autoimmune thyroiditis: Secondary | ICD-10-CM

## 2022-02-13 DIAGNOSIS — E1065 Type 1 diabetes mellitus with hyperglycemia: Secondary | ICD-10-CM

## 2022-02-23 ENCOUNTER — Encounter (INDEPENDENT_AMBULATORY_CARE_PROVIDER_SITE_OTHER): Payer: Self-pay

## 2022-03-03 ENCOUNTER — Telehealth (INDEPENDENT_AMBULATORY_CARE_PROVIDER_SITE_OTHER): Payer: Self-pay | Admitting: "Endocrinology

## 2022-03-03 NOTE — Telephone Encounter (Signed)
  Name of who is calling:Lori   Caller's Relationship to Patient:mother   Best contact number:[phone]  Provider they see:Dr.Brennan   Reason for call:caller stated that she needed a call back to see if Leith is being referred out to adult Endo or will see a provider in office for now? Please advise      PRESCRIPTION REFILL ONLY  Name of prescription:  Pharmacy:

## 2022-03-08 NOTE — Telephone Encounter (Signed)
Attempted to call. LVM with call back number,. Patient will need to start with another provider at another practice.

## 2022-03-25 ENCOUNTER — Ambulatory Visit (INDEPENDENT_AMBULATORY_CARE_PROVIDER_SITE_OTHER): Payer: Medicare Other | Admitting: Pediatric Endocrinology

## 2022-04-07 ENCOUNTER — Telehealth (INDEPENDENT_AMBULATORY_CARE_PROVIDER_SITE_OTHER): Payer: Self-pay

## 2022-04-07 NOTE — Telephone Encounter (Signed)
  Name of who is calling:Lori   Caller's Relationship to Patient:mother   Best contact number:[phone]   Provider they see:Dr.Badik   Reason for call:caller stated that she called to schedule a appointment with the endocrinologist at [facility] clinic however they stated that they are not accepting new patients and do not have a referral. Mom asked for a call back.      PRESCRIPTION REFILL ONLY  Name of prescription:  Pharmacy:

## 2022-04-09 NOTE — Telephone Encounter (Signed)
Notes from [facility] clinic say they will reach out to patient. They do not need a referral or notes.

## 2022-04-28 ENCOUNTER — Other Ambulatory Visit: Payer: Self-pay

## 2022-04-28 ENCOUNTER — Emergency Department
Admission: EM | Admit: 2022-04-28 | Discharge: 2022-04-28 | Disposition: A | Discharge status: Home / Self Care | Payer: Medicare Other | Attending: Emergency Medicine | Admitting: Emergency Medicine

## 2022-04-28 ENCOUNTER — Emergency Department (HOSPITAL_COMMUNITY)
Admission: EM | Admit: 2022-04-28 | Discharge: 2022-04-28 | Discharge status: Against Medical Advice | Payer: Medicare Other | Attending: Emergency Medicine | Admitting: Emergency Medicine

## 2022-04-28 DIAGNOSIS — R112 Nausea with vomiting, unspecified: Secondary | ICD-10-CM | POA: Diagnosis present

## 2022-04-28 DIAGNOSIS — Z20822 Contact with and (suspected) exposure to covid-19: Secondary | ICD-10-CM | POA: Diagnosis not present

## 2022-04-28 DIAGNOSIS — J101 Influenza due to other identified influenza virus with other respiratory manifestations: Secondary | ICD-10-CM | POA: Diagnosis not present

## 2022-04-28 DIAGNOSIS — R1110 Vomiting, unspecified: Secondary | ICD-10-CM | POA: Diagnosis present

## 2022-04-28 DIAGNOSIS — E1065 Type 1 diabetes mellitus with hyperglycemia: Secondary | ICD-10-CM | POA: Diagnosis not present

## 2022-04-28 DIAGNOSIS — R739 Hyperglycemia, unspecified: Secondary | ICD-10-CM

## 2022-04-28 DIAGNOSIS — Z5321 Procedure and treatment not carried out due to patient leaving prior to being seen by health care provider: Secondary | ICD-10-CM | POA: Insufficient documentation

## 2022-04-28 LAB — CBC
HCT: 37.5 % — ABNORMAL LOW (ref 39.0–52.0)
Hemoglobin: 12.3 g/dL — ABNORMAL LOW (ref 13.0–17.0)
MCH: 31.7 pg (ref 26.0–34.0)
MCHC: 32.8 g/dL (ref 30.0–36.0)
MCV: 96.6 fL (ref 80.0–100.0)
Platelets: 170 10*3/uL (ref 150–400)
RBC: 3.88 MIL/uL — ABNORMAL LOW (ref 4.22–5.81)
RDW: 12.5 % (ref 11.5–15.5)
WBC: 5.9 10*3/uL (ref 4.0–10.5)
nRBC: 0 % (ref 0.0–0.2)

## 2022-04-28 LAB — BASIC METABOLIC PANEL
Anion gap: 12 (ref 5–15)
BUN: 34 mg/dL — ABNORMAL HIGH (ref 6–20)
CO2: 22 mmol/L (ref 22–32)
Calcium: 8 mg/dL — ABNORMAL LOW (ref 8.9–10.3)
Chloride: 101 mmol/L (ref 98–111)
Creatinine, Ser: 1.51 mg/dL — ABNORMAL HIGH (ref 0.61–1.24)
GFR, Estimated: >60 mL/min (ref >60)
Glucose, Bld: 356 mg/dL — ABNORMAL HIGH (ref 70–99)
Potassium: 4.8 mmol/L (ref 3.5–5.1)
Sodium: 135 mmol/L (ref 135–145)

## 2022-04-28 LAB — COMPREHENSIVE METABOLIC PANEL
ALT: 19 U/L (ref 0–44)
AST: 23 U/L (ref 15–41)
Albumin: 2.2 g/dL — ABNORMAL LOW (ref 3.5–5.0)
Alkaline Phosphatase: 57 U/L (ref 38–126)
Anion gap: 9 (ref 5–15)
BUN: 31 mg/dL — ABNORMAL HIGH (ref 6–20)
CO2: 25 mmol/L (ref 22–32)
Calcium: 8.2 mg/dL — ABNORMAL LOW (ref 8.9–10.3)
Chloride: 100 mmol/L (ref 98–111)
Creatinine, Ser: 1.81 mg/dL — ABNORMAL HIGH (ref 0.61–1.24)
GFR, Estimated: 50 mL/min — ABNORMAL LOW (ref >60)
Glucose, Bld: 332 mg/dL — ABNORMAL HIGH (ref 70–99)
Potassium: 4.8 mmol/L (ref 3.5–5.1)
Sodium: 134 mmol/L — ABNORMAL LOW (ref 135–145)
Total Bilirubin: 0.8 mg/dL (ref 0.3–1.2)
Total Protein: 5.4 g/dL — ABNORMAL LOW (ref 6.5–8.1)

## 2022-04-28 LAB — URINALYSIS, ROUTINE W REFLEX MICROSCOPIC
Bilirubin Urine: NEGATIVE
Glucose, UA: >=500 mg/dL — AB
Ketones, ur: 20 mg/dL — AB
Leukocytes,Ua: NEGATIVE
Nitrite: NEGATIVE
Protein, ur: >=300 mg/dL — AB
Specific Gravity, Urine: 1.024 (ref 1.005–1.030)
pH: 5 (ref 5.0–8.0)

## 2022-04-28 LAB — RESP PANEL BY RT-PCR (FLU A&B, COVID) ARPGX2
Influenza A by PCR: POSITIVE — AB
Influenza B by PCR: NEGATIVE
SARS Coronavirus 2 by RT PCR: NEGATIVE

## 2022-04-28 LAB — CBG MONITORING, ED
Glucose-Capillary: 315 mg/dL — ABNORMAL HIGH (ref 70–99)
Glucose-Capillary: 388 mg/dL — ABNORMAL HIGH (ref 70–99)
Glucose-Capillary: 309 mg/dL — ABNORMAL HIGH (ref 70–99)
Glucose-Capillary: 302 mg/dL — ABNORMAL HIGH (ref 70–99)

## 2022-04-28 LAB — LIPASE, BLOOD: Lipase: 37 U/L (ref 11–51)

## 2022-04-28 MED ORDER — ONDANSETRON 4 MG PO TBDP: 4.0000 mg | ORAL_TABLET | Freq: Three times a day (TID) | ORAL | 0 refills | Status: AC | PRN

## 2022-04-28 MED ORDER — ONDANSETRON HCL 4 MG/2ML IJ SOLN
4.0000 mg | Freq: Once | INTRAMUSCULAR | Status: AC
  Administered 2022-04-28: 4 mg via INTRAVENOUS
  Filled 2022-04-28: qty 2

## 2022-04-28 MED ORDER — LACTATED RINGERS IV BOLUS
1000.0000 mL | Freq: Once | INTRAVENOUS | Status: AC
  Administered 2022-04-28: 1000 mL via INTRAVENOUS

## 2022-04-28 MED ORDER — INSULIN ASPART 100 UNIT/ML IJ SOLN
5.0000 [IU] | Freq: Once | INTRAMUSCULAR | Status: AC
  Administered 2022-04-28: 5 [IU] via INTRAVENOUS
  Filled 2022-04-28: qty 1

## 2022-04-28 NOTE — ED Triage Notes (Addendum)
Pt comes with c/o N/V that started last night.

## 2022-04-28 NOTE — Inpatient Diabetes Management (Signed)
Inpatient Diabetes Program Recommendations  AACE/ADA: New Consensus Statement on Inpatient Glycemic Control   Target Ranges:  Prepandial:   less than 140 mg/dL      Peak postprandial:   less than 180 mg/dL (1-2 hours)      Critically ill patients:  140 - 180 mg/dL    Latest Reference Range & Units 04/28/22 07:25  Glucose-Capillary 70 - 99 mg/dL 938 (H)    Latest Reference Range & Units 04/28/22 07:42  CO2 22 - 32 mmol/L 25  Glucose 70 - 99 mg/dL 101 (H)  Anion gap 5 - 15  9   Review of Glycemic Control  Diabetes history: DM1 (does NOT make any insulin; requires basal, correction, and carb coverage insulin) Outpatient Diabetes medications: Humalog 75/25 28 units QAM, Humalog 75/25 24 units QPM, FreeStyle Libre CGM Current orders for Inpatient glycemic control: None; in ED  Inpatient Diabetes Program Recommendations:    Insulin: If patient is admitted, please consider ordering Semglee 19 units Q24H (based on 63.2 kg x 0.3 units), CBGs Q4H, and Novolog 0-9 units Q4H.  NOTE: Patient is currently in the ED with nausea and vomiting. Noted in reviewing chart that patient has Type 1 DM and was seeing Dr. Fransico Michael (Pediatric Endocrinologist) and last seen on 01/19/22. Per office note on 01/19/22, patient was asked to take "Humalog Mix 75/25 insulin dose in the mornings to 30 units. Change  the dinner insulin dose to 22 units. However, if the morning BG is <100, reduce the morning insulin dose to 28 units. If the dinner BG is <100, reduce the dinner insulin dose to 20 units. If the BG at bedtime is <200, follow the Small column bedtime snack plan." Dr. Fransico Michael has retired and patient was asked to make appointment with [facility] to establish care there.   Thanks, [name] Penner, RN, MSN, CDCES Diabetes Coordinator Inpatient Diabetes Program [phone] (Team Pager from 8am to 5pm)

## 2022-04-28 NOTE — ED Triage Notes (Signed)
Pt. Stated, N/V and unable to keep anything down for the last 2 days.

## 2022-04-28 NOTE — ED Notes (Signed)
Pt left stated he has been waiting too long.

## 2022-04-28 NOTE — ED Notes (Signed)
Pt presents to ED from [facility] cone from being told he "had an acute kidney injury". Pt states been sick since Thursday. Pt states he has had N/V since then.

## 2022-04-28 NOTE — Discharge Instructions (Addendum)
Keep an eye on his sugars and make sure you are giving him his diabetes medicine.  He has the flu which is most likely why he is feeling dehydrated.  If he develops worsening symptoms please return to the ER or he develop any other concerns

## 2022-04-28 NOTE — ED Provider Notes (Signed)
[facility] Provider Note    Event Date/Time   First MD Initiated Contact with Patient 04/28/22 1122     (approximate)   History   Emesis   HPI  [name] is a 36 y.o. male with a history of type 1 diabetes who comes in with concerns for dehydration.  Patient was seen at [facility] noted to have AKI no evidence of DKA.  He did not take his diabetes meds this morning however.  Child comes in with the mother who is reporting child's not been feeling well for the past couple days.  He has had nausea, vomiting decreased p.o. intake.  Denies any abdominal pain, testicle pain, urinary symptoms.  Denies any significant shortness of breath or sore throat.  They report having blood work done in the just this morning but given there was a long wait at [facility] they decided to come here instead.   Physical Exam   Triage Vital Signs: ED Triage Vitals  Enc Vitals Group     BP 04/28/22 1018 (!) 134/95     Pulse Rate 04/28/22 1018 96     Resp 04/28/22 1018 19     Temp 04/28/22 1018 98.9 F (37.2 C)     Temp src --      SpO2 04/28/22 1018 97 %     Weight --      Height --      Head Circumference --      Peak Flow --      Pain Score 04/28/22 1008 0     Pain Loc --      Pain Edu? --      Excl. in GC? --     Most recent vital signs: Vitals:   04/28/22 1018  BP: (!) 134/95  Pulse: 96  Resp: 19  Temp: 98.9 F (37.2 C)  SpO2: 97%     General: Awake, no distress.  CV:  Good peripheral perfusion.  Resp:  Normal effort.  Abd:  No distention.  Soft and nontender Other:  Clear lungs. Oropharynx slightly red but no pus noted.  Uvula midline   ED Results / Procedures / Treatments   Labs (all labs ordered are listed, but only abnormal results are displayed) Labs Reviewed  RESP PANEL BY RT-PCR (FLU A&B, COVID) ARPGX2 - Abnormal; Notable for the following components:      Result Value   Influenza A by PCR POSITIVE (*)    All other components  within normal limits  GROUP A STREP BY PCR  BASIC METABOLIC PANEL      PROCEDURES:  Critical Care performed: No  Procedures   MEDICATIONS ORDERED IN ED: Medications  lactated ringers bolus 1,000 mL (1,000 mLs Intravenous Bolus 04/28/22 1245)  ondansetron (ZOFRAN) injection 4 mg (4 mg Intravenous Given 04/28/22 1245)  lactated ringers bolus 1,000 mL (1,000 mLs Intravenous Bolus 04/28/22 1244)     IMPRESSION / MDM / ASSESSMENT AND PLAN / ED COURSE  I reviewed the triage vital signs and the nursing notes.   Patient's presentation is most consistent with acute presentation with potential threat to life or bodily function.   I reviewed patient's labs where his glucose was 332 creatinine was 1.81 anion gap was normal.  CBC was reassuring with stable hemoglobin.  Urine with small amount of ketones. This is concerning for dehydration I suspect related to COVID, flu.  No other symptoms urine without evidence of UTI.  No shortness of breath.  No signs of strep on examination but given some mild sore throat patient strep test was ordered.  Patient be given IV fluids and IV Zofran and will recheck  Patient reevaluated feels much better.  Will do p.o. challenge and recheck of glucose.  His glucose was elevated but he does report that he has not taken his insulin today.  Given worsening glucose from this morning will get BMP to evaluate for DKA sicne this AM-- will give 5 IV insulin.   Patient handed off pending these results.  I did discuss with family about the pros and cons of Tamiflu but given patient is already 4 days out they have opted to want to hold off on Tamiflu.  We will send home with Zofran.  Patient strep test was not sent down for given patient is flu a positive and no evidence of strep on examination I have very low suspicion  FINAL CLINICAL IMPRESSION(S) / ED DIAGNOSES   Final diagnoses:  Influenza A  Hyperglycemia     Rx / DC Orders   ED Discharge Orders     None         Note:  This document was prepared using Dragon voice recognition software and may include unintentional dictation errors.   Concha Se, MD 04/28/22 1409

## 2022-05-01 ENCOUNTER — Encounter: Payer: Self-pay | Admitting: Emergency Medicine

## 2022-05-01 ENCOUNTER — Emergency Department
Admission: EM | Admit: 2022-05-01 | Discharge: 2022-05-01 | Disposition: A | Discharge status: Home / Self Care | Payer: Medicare Other | Attending: Emergency Medicine | Admitting: Emergency Medicine

## 2022-05-01 DIAGNOSIS — J111 Influenza due to unidentified influenza virus with other respiratory manifestations: Secondary | ICD-10-CM | POA: Diagnosis present

## 2022-05-01 DIAGNOSIS — R739 Hyperglycemia, unspecified: Secondary | ICD-10-CM

## 2022-05-01 DIAGNOSIS — R112 Nausea with vomiting, unspecified: Secondary | ICD-10-CM

## 2022-05-01 DIAGNOSIS — E1165 Type 2 diabetes mellitus with hyperglycemia: Secondary | ICD-10-CM | POA: Insufficient documentation

## 2022-05-01 LAB — COMPREHENSIVE METABOLIC PANEL
ALT: 20 U/L (ref 0–44)
AST: 24 U/L (ref 15–41)
Albumin: 2.1 g/dL — ABNORMAL LOW (ref 3.5–5.0)
Alkaline Phosphatase: 73 U/L (ref 38–126)
Anion gap: 8 (ref 5–15)
BUN: 32 mg/dL — ABNORMAL HIGH (ref 6–20)
CO2: 23 mmol/L (ref 22–32)
Calcium: 8.2 mg/dL — ABNORMAL LOW (ref 8.9–10.3)
Chloride: 102 mmol/L (ref 98–111)
Creatinine, Ser: 1.67 mg/dL — ABNORMAL HIGH (ref 0.61–1.24)
GFR, Estimated: 55 mL/min — ABNORMAL LOW (ref >60)
Glucose, Bld: 375 mg/dL — ABNORMAL HIGH (ref 70–99)
Potassium: 5.1 mmol/L (ref 3.5–5.1)
Sodium: 133 mmol/L — ABNORMAL LOW (ref 135–145)
Total Bilirubin: 1 mg/dL (ref 0.3–1.2)
Total Protein: 5.9 g/dL — ABNORMAL LOW (ref 6.5–8.1)

## 2022-05-01 LAB — CBC WITH DIFFERENTIAL/PLATELET
Abs Immature Granulocytes: 0.06 10*3/uL (ref 0.00–0.07)
Basophils Absolute: 0 10*3/uL (ref 0.0–0.1)
Basophils Relative: 0 %
Eosinophils Absolute: 0 10*3/uL (ref 0.0–0.5)
Eosinophils Relative: 0 %
HCT: 37.6 % — ABNORMAL LOW (ref 39.0–52.0)
Hemoglobin: 12.3 g/dL — ABNORMAL LOW (ref 13.0–17.0)
Immature Granulocytes: 1 %
Lymphocytes Relative: 9 %
Lymphs Abs: 1.1 10*3/uL (ref 0.7–4.0)
MCH: 31.3 pg (ref 26.0–34.0)
MCHC: 32.7 g/dL (ref 30.0–36.0)
MCV: 95.7 fL (ref 80.0–100.0)
Monocytes Absolute: 0.7 10*3/uL (ref 0.1–1.0)
Monocytes Relative: 5 %
Neutro Abs: 10.8 10*3/uL — ABNORMAL HIGH (ref 1.7–7.7)
Neutrophils Relative %: 85 %
Platelets: 227 10*3/uL (ref 150–400)
RBC: 3.93 MIL/uL — ABNORMAL LOW (ref 4.22–5.81)
RDW: 12.2 % (ref 11.5–15.5)
WBC: 12.6 10*3/uL — ABNORMAL HIGH (ref 4.0–10.5)
nRBC: 0 % (ref 0.0–0.2)

## 2022-05-01 LAB — CBG MONITORING, ED
Glucose-Capillary: 355 mg/dL — ABNORMAL HIGH (ref 70–99)
Glucose-Capillary: 328 mg/dL — ABNORMAL HIGH (ref 70–99)

## 2022-05-01 LAB — LIPASE, BLOOD: Lipase: 33 U/L (ref 11–51)

## 2022-05-01 MED ORDER — ACETAMINOPHEN 500 MG PO TABS
1000.0000 mg | ORAL_TABLET | Freq: Once | ORAL | Status: AC
  Administered 2022-05-01: 1000 mg via ORAL
  Filled 2022-05-01: qty 2

## 2022-05-01 MED ORDER — LACTATED RINGERS IV BOLUS
1000.0000 mL | Freq: Once | INTRAVENOUS | Status: AC
  Administered 2022-05-01: 1000 mL via INTRAVENOUS

## 2022-05-01 NOTE — ED Provider Triage Note (Signed)
Emergency Medicine Provider Triage Evaluation Note  [name] , a 36 y.o. male  was evaluated in triage.  Pt complains of emesis and feeling worse.  He was diagnosed with flu 2 days ago and he is a type I diabetic.Marland Kitchen  Review of Systems  Positive:  Negative:   Physical Exam  BP (!) 144/90 (BP Location: Left Arm)   Pulse 100   Temp 99 F (37.2 C) (Oral)   Resp 20   Ht 5\' 2"  (1.575 m)   Wt 66.4 kg   SpO2 100%   BMI 26.77 kg/m  Gen:   Awake, no distress   Resp:  Normal effort  MSK:   Moves extremities without difficulty  Other:    Medical Decision Making  Medically screening exam initiated at 3:30 AM.  Appropriate orders placed.  [name] was informed that the remainder of the evaluation will be completed by another provider, this initial triage assessment does not replace that evaluation, and the importance of remaining in the ED until their evaluation is complete.  DKA rule out.   [name] Savoy, MD 05/01/22 [phone]

## 2022-05-01 NOTE — ED Triage Notes (Signed)
Pt presents via POV with complaints of flu-sx. Pt states he tested positive on Wednesday and since that time he has had generalized bodyaches and fatigue. He notes taking Tylenol with mild improvement to his sx. Of note, pt is a T1DM - he endorses intermittent nausea. Denies CP or SOB.  [id number]

## 2022-05-01 NOTE — ED Provider Notes (Signed)
[facility] Provider Note    None    (approximate)   History   Influenza   HPI  [name] is a 36 y.o. male who presents to the ED for evaluation of Influenza   I reviewed ED visit from a couple days ago where he was seen for similar syndrome.  Diagnosed with influenza A, hyperglycemia but no DKA and was discharged after fluids and a small IV insulin bolus.  He presents to the ED with similar symptoms of myalgias, nausea, poor appetite and generalized malaise.  Reports these might seem a little bit worse and more to get checked out.  Reports he does not feel like that he is in DKA.   Physical Exam   Triage Vital Signs: ED Triage Vitals  Enc Vitals Group     BP 05/01/22 0325 (!) 144/90     Pulse Rate 05/01/22 0325 100     Resp 05/01/22 0325 20     Temp 05/01/22 0325 99 F (37.2 C)     Temp Source 05/01/22 0325 Oral     SpO2 05/01/22 0325 100 %     Weight 05/01/22 0326 146 lb 6.2 oz (66.4 kg)     Height 05/01/22 0326 5\' 2"  (1.575 m)     Head Circumference --      Peak Flow --      Pain Score 05/01/22 0326 3     Pain Loc --      Pain Edu? --      Excl. in GC? --     Most recent vital signs: Vitals:   05/01/22 0325 05/01/22 0437  BP: (!) 144/90 128/79  Pulse: 100 89  Resp: 20 18  Temp: 99 F (37.2 C)   SpO2: 100% 100%    General: Awake, no distress.  Pleasant and conversational. CV:  Good peripheral perfusion.  Resp:  Normal effort.  Abd:  No distention.  Soft and benign throughout. MSK:  No deformity noted.  Neuro:  No focal deficits appreciated. Other:     ED Results / Procedures / Treatments   Labs (all labs ordered are listed, but only abnormal results are displayed) Labs Reviewed  CBC WITH DIFFERENTIAL/PLATELET - Abnormal; Notable for the following components:      Result Value   WBC 12.6 (*)    RBC 3.93 (*)    Hemoglobin 12.3 (*)    HCT 37.6 (*)    Neutro Abs 10.8 (*)    All other components within  normal limits  COMPREHENSIVE METABOLIC PANEL - Abnormal; Notable for the following components:   Sodium 133 (*)    Glucose, Bld 375 (*)    BUN 32 (*)    Creatinine, Ser 1.67 (*)    Calcium 8.2 (*)    Total Protein 5.9 (*)    Albumin 2.1 (*)    GFR, Estimated 55 (*)    All other components within normal limits  CBG MONITORING, ED - Abnormal; Notable for the following components:   Glucose-Capillary 355 (*)    All other components within normal limits  CBG MONITORING, ED - Abnormal; Notable for the following components:   Glucose-Capillary 328 (*)    All other components within normal limits  LIPASE, BLOOD    EKG   RADIOLOGY   Official radiology report(s): No results found.  PROCEDURES and INTERVENTIONS:  Procedures  Medications  lactated ringers bolus 1,000 mL (0 mLs Intravenous Stopped 05/01/22 0438)  acetaminophen (TYLENOL) tablet 1,000 mg (  1,000 mg Oral Given 05/01/22 0334)     IMPRESSION / MDM / ASSESSMENT AND PLAN / ED COURSE  I reviewed the triage vital signs and the nursing notes.  Differential diagnosis includes, but is not limited to, viral syndrome, DKA, hyperglycemia, sepsis  {Patient presents with symptoms of an acute illness or injury that is potentially life-threatening.  Type I diabetic returns with generalized myalgias and malaise in the setting of a recent diagnosis of influenza A.  Found to be hyperglycemic without signs of DKA, similar to his previous ED visit.  Feeling much better after IV fluids and tolerating p.o. intake with some improvement of his hyperglycemia.  Suitable for outpatient management.  I considered observation admission for this patient.  Clinical Course as of 05/01/22 0449  Sat May 01, 2022  0423 Reassessed.  Feeling better.  His mother is now at the bedside and reports he does seem better now.  We discussed his workup and no signs of DKA.  We discussed management of the flu at home as well as return precautions for the ED.   Answered questions. [DS]    Clinical Course User Index [DS] Delton Prairie, MD     FINAL CLINICAL IMPRESSION(S) / ED DIAGNOSES   Final diagnoses:  Influenza  Nausea and vomiting, unspecified vomiting type  Hyperglycemia     Rx / DC Orders   ED Discharge Orders     None        Note:  This document was prepared using Dragon voice recognition software and may include unintentional dictation errors.   Delton Prairie, MD 05/01/22 [phone]

## 2022-05-05 ENCOUNTER — Encounter (HOSPITAL_COMMUNITY): Payer: Self-pay | Admitting: Emergency Medicine

## 2022-05-05 ENCOUNTER — Other Ambulatory Visit: Payer: Self-pay

## 2022-05-05 ENCOUNTER — Ambulatory Visit (HOSPITAL_COMMUNITY)
Admission: EM | Admit: 2022-05-05 | Discharge: 2022-05-05 | Disposition: A | Discharge status: Home / Self Care | Payer: Medicare Other | Attending: Emergency Medicine | Admitting: Emergency Medicine

## 2022-05-05 DIAGNOSIS — E1065 Type 1 diabetes mellitus with hyperglycemia: Secondary | ICD-10-CM

## 2022-05-05 DIAGNOSIS — J069 Acute upper respiratory infection, unspecified: Secondary | ICD-10-CM

## 2022-05-05 LAB — CBG MONITORING, ED: Glucose-Capillary: 368 mg/dL — ABNORMAL HIGH (ref 70–99)

## 2022-05-05 MED ORDER — PROMETHAZINE-DM 6.25-15 MG/5ML PO SYRP: 5.0000 mL | ORAL_SOLUTION | Freq: Every evening | ORAL | 0 refills | Status: DC | PRN

## 2022-05-05 MED ORDER — AMOXICILLIN-POT CLAVULANATE 875-125 MG PO TABS: 1.0000 | ORAL_TABLET | Freq: Two times a day (BID) | ORAL | 0 refills | Status: AC

## 2022-05-05 MED ORDER — ACETAMINOPHEN 325 MG PO TABS
ORAL_TABLET | ORAL | Status: AC
  Filled 2022-05-05: qty 2

## 2022-05-05 MED ORDER — BENZONATATE 100 MG PO CAPS: 100.0000 mg | ORAL_CAPSULE | Freq: Three times a day (TID) | ORAL | 0 refills | Status: DC

## 2022-05-05 MED ORDER — ACETAMINOPHEN 325 MG PO TABS
650.0000 mg | ORAL_TABLET | Freq: Once | ORAL | Status: AC
  Administered 2022-05-05: 650 mg via ORAL

## 2022-05-05 NOTE — Discharge Instructions (Signed)
Your symptoms at this time are most likely being prolonged by bacteria and even though it initially began as a virus, as it has been 10 days without signs of improvement we will provide an antibiotic  Take Augmentin every morning and every evening for 10 days, ideally you will start to see improvement in about 48 hours  You may use Tessalon Perles every 8 hours to help calm your coughing  You may use cough syrup at bedtime to allow for rest, be mindful this will make you drowsy, look on [employer]com for coupon for medicine  CBG in office is 368, on evaluation of chart, no elevated it is no change  You can take Tylenol and/or Ibuprofen as needed for fever reduction and pain relief.   For cough: honey 1/2 to 1 teaspoon (you can dilute the honey in water or another fluid).  You can also use guaifenesin and dextromethorphan for cough. You can use a humidifier for chest congestion and cough.  If you don't have a humidifier, you can sit in the bathroom with the hot shower running.      For sore throat: try warm salt water gargles, cepacol lozenges, throat spray, warm tea or water with lemon/honey, popsicles or ice, or OTC cold relief medicine for throat discomfort.   For congestion: take a daily anti-histamine like Zyrtec, Claritin, and a oral decongestant, such as pseudoephedrine.  You can also use Flonase 1-2 sprays in each nostril daily.   It is important to stay hydrated: drink plenty of fluids (water, gatorade/powerade/pedialyte, juices, or teas) to keep your throat moisturized and help further relieve irritation/discomfort.
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
## 2022-05-05 NOTE — ED Triage Notes (Signed)
04/25/2022 patient started not feeling well.    On Tuesday he was seen at [facility] and told he has the flu, not DKA  Went back on Thursday, told he was not in DKA  Patient attempted to go to work today, but around 2:00 pm, called mother and complained of body hurting, nausea.  Patient is still coughing.  Has not checked glucose today

## 2022-06-15 ENCOUNTER — Telehealth (INDEPENDENT_AMBULATORY_CARE_PROVIDER_SITE_OTHER): Payer: Self-pay | Admitting: "Endocrinology

## 2022-06-15 NOTE — Telephone Encounter (Signed)
Who is calling: Kendal Raffo (Mother)  Contact info: [phone]  Provider: Sherrlyn Hock.  Reason for calling: Referral was sent to [facility] endcrinology and the patients mother wants to make sure that it was sent to their office.

## 2022-07-06 ENCOUNTER — Telehealth (INDEPENDENT_AMBULATORY_CARE_PROVIDER_SITE_OTHER): Payer: Self-pay | Admitting: "Endocrinology

## 2022-07-06 NOTE — Telephone Encounter (Signed)
  Name of who is calling: Lori  Caller's Relationship to Patient: Mom  Best contact number: [phone]  Provider they see:  Reason for call: Mom called and stated that Harrell Gave doesn't have his appointment with adult ENDO until 08/26/22. Mom wants to know if someone could call in a prescription to be refilled enough to last him until his appt in April. Mom is requesting a callback.      PRESCRIPTION REFILL ONLY  Name of prescription: HUMALOG 70/30  Pharmacy: [facility]

## 2022-07-06 NOTE — Telephone Encounter (Signed)
Spoke with mom. Let her know legally we can not send in anything for the patient due to him being an adult and we dont have a provider here that signs or sees adult patients. That they will need to call his PCP for anything until his appointment. Mom verbally understood.

## 2022-07-08 ENCOUNTER — Encounter (HOSPITAL_COMMUNITY): Payer: Self-pay | Admitting: *Deleted

## 2022-07-08 ENCOUNTER — Ambulatory Visit (HOSPITAL_COMMUNITY)
Admission: EM | Admit: 2022-07-08 | Discharge: 2022-07-08 | Disposition: A | Discharge status: Home / Self Care | Payer: 59 | Attending: Family Medicine | Admitting: Family Medicine

## 2022-07-08 DIAGNOSIS — E1065 Type 1 diabetes mellitus with hyperglycemia: Secondary | ICD-10-CM

## 2022-07-08 DIAGNOSIS — I10 Essential (primary) hypertension: Secondary | ICD-10-CM | POA: Diagnosis not present

## 2022-07-08 DIAGNOSIS — E063 Autoimmune thyroiditis: Secondary | ICD-10-CM | POA: Diagnosis not present

## 2022-07-08 MED ORDER — BD PEN NEEDLE MICRO U/F 32G X 6 MM MISC: 1 refills | Status: DC

## 2022-07-08 MED ORDER — INSULIN LISPRO PROT & LISPRO (75-25 MIX) 100 UNIT/ML KWIKPEN: PEN_INJECTOR | SUBCUTANEOUS | 3 refills | Status: DC

## 2022-07-08 NOTE — ED Triage Notes (Signed)
Pt needs refill of Humalog 75/25 [name] he has aged out of his peds endo and has an upcoming appt at [facility] 08/26/2022 but they can't see him before then.  He had a eye surgery scheduled today, they wouldn't perform due to blood sugar being over 400. Pt states he went home and took his insulin.

## 2022-07-08 NOTE — Discharge Instructions (Signed)
Your blood pressure was noted to be elevated during your visit today. If you are currently taking medication for high blood pressure, please ensure you are taking this as directed. If you do not have a history of high blood pressure and your blood pressure remains persistently elevated, you may need to begin taking a medication at some point. You may return here within the next few days to recheck if unable to see your primary care provider or if you do not have a one.  BP (!) 158/121 (BP Location: Right Arm)   Pulse 96   Temp 97.9 F (36.6 C) (Oral)   Resp 18   SpO2 99%   BP Readings from Last 3 Encounters:  07/08/22 (!) 158/121  05/05/22 139/79  05/01/22 128/79

## 2022-07-08 NOTE — ED Provider Notes (Signed)
[place]   [id number] 07/08/22 Arrival Time: [id number]  ASSESSMENT & PLAN:  1. Uncontrolled type 1 diabetes mellitus with hyperglycemia ([place])   2. Type 1 diabetes mellitus with hyperglycemia (HCC)   3. Thyroiditis, autoimmune   4. Elevated blood pressure reading in office with diagnosis of hypertension     Meds ordered this encounter  Medications   Insulin Lispro Prot & Lispro (HUMALOG MIX 75/25 KWIKPEN) (75-25) 100 UNIT/ML Kwikpen    Sig: INJECT 28 UNITS INTO THE SKIN IN THE MORNING AND 24 UNITS IN THE EVENING    Dispense:  15 mL    Refill:  3    DX Code Needed  NEEDS FUTURE REFILLS.   Insulin Pen Needle (BD PEN NEEDLE MICRO U/F) 32G X 6 MM MISC    Sig: USE TO INJECT INSULIN 6 TIMES DAILY. AS NEEDED    Dispense:  200 each    Refill:  1   Has planned new PCP f/u in 08/2022. May return here to recheck CBG and BP at any time. Reports CBG > 400 this morning.    Discharge Instructions      Your blood pressure was noted to be elevated during your visit today. If you are currently taking medication for high blood pressure, please ensure you are taking this as directed. If you do not have a history of high blood pressure and your blood pressure remains persistently elevated, you may need to begin taking a medication at some point. You may return here within the next few days to recheck if unable to see your primary care provider or if you do not have a one.  BP (!) 158/121 (BP Location: Right Arm)   Pulse 96   Temp 97.9 F (36.6 C) (Oral)   Resp 18   SpO2 99%   BP Readings from Last 3 Encounters:  07/08/22 (!) 158/121  05/05/22 139/79  05/01/22 128/79         Reviewed expectations re: course of current medical issues. Questions answered. Outlined signs and symptoms indicating need for more acute intervention. Patient verbalized understanding. After Visit Summary given.   SUBJECTIVE: History from: patient. [name] is a 37 y.o. male who  presents requesting medication refill for insulin. No current concerns. Increased blood pressure noted today. Reports that he is treated for HTN.  He reports taking medications as instructed, no chest pain on exertion, no dyspnea on exertion, no swelling of ankles, no orthostatic dizziness or lightheadedness, no orthopnea or paroxysmal nocturnal dyspnea, and no palpitations.   Current medical problems include: Past Medical History:  Diagnosis Date   Angiopathy, diabetic ([place])    Diabetes mellitus    Type 1, diagnosed age 77, with frequent DKA admissions, difficult to control due to MR   Diabetic nephropathy ([place])    Started on Lisinopril 5 mg   Diabetic peripheral neuropathy (HCC)    Dysmorphic features    Goiter    Hypertension    Hypoglycemia associated with diabetes ([place])    Hypothyroidism    Impaired cognition    mention of mental retardation   Mental retardation    Moderate or severe vision impairment, both eyes, impairment level not further specified    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   Thyroiditis, autoimmune       OBJECTIVE:  Vitals:   07/08/22 1205  BP: (!) 158/121  Pulse: 96  Resp: 18  Temp: 97.9 F (36.6 C)  TempSrc: Oral  SpO2: 99%    General appearance: alert; no distress HENT: normocephalic; atraumatic Neck: supple  Heart: regular  Psychological: alert and cooperative; normal mood and affect   No Known Allergies  Social History   Socioeconomic History   Marital status: Single    Spouse name: Not on file   Number of children: Not on file   Years of education: 12   Highest education level: Not on file  Occupational History    Employer: [employer]    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   Drug use: No   Sexual activity: Never  Other Topics Concern   Not on file  Social History Narrative   Lives in [place] with parents. Patient has 2 siblings a brother with a cardiac condition and a sister who is healthy.   Patient is legally blind and thus cannot drive.    Notable for developmental delay and illiteracy. 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   Social Determinants of Health   Financial Resource Strain: Not on file  Food Insecurity: Not on file  Transportation Needs: Not on file  Physical Activity: Not on file  Stress: Not on file  Social Connections: Not on file  Intimate Partner Violence: Not on file   Family History  Problem Relation Age of Onset   Vision loss Father    Retinitis pigmentosa Mother    Diabetes Maternal Grandmother        AODM   Diabetes Paternal Grandmother        AODM   Diabetes Cousin        Second cousin has juvenile-onset DM.   Past Surgical History:  Procedure Laterality Date   DENTAL SURGERY        Vanessa Kick, MD 07/08/22 1620

## 2022-07-30 ENCOUNTER — Emergency Department (HOSPITAL_COMMUNITY)
Admission: EM | Admit: 2022-07-30 | Discharge: 2022-07-30 | Disposition: A | Discharge status: Home / Self Care | Payer: 59 | Attending: Emergency Medicine | Admitting: Emergency Medicine

## 2022-07-30 ENCOUNTER — Encounter (HOSPITAL_COMMUNITY): Payer: Self-pay

## 2022-07-30 ENCOUNTER — Other Ambulatory Visit: Payer: Self-pay

## 2022-07-30 DIAGNOSIS — E039 Hypothyroidism, unspecified: Secondary | ICD-10-CM | POA: Diagnosis not present

## 2022-07-30 DIAGNOSIS — E1040 Type 1 diabetes mellitus with diabetic neuropathy, unspecified: Secondary | ICD-10-CM | POA: Insufficient documentation

## 2022-07-30 DIAGNOSIS — Z7982 Long term (current) use of aspirin: Secondary | ICD-10-CM | POA: Diagnosis not present

## 2022-07-30 DIAGNOSIS — R739 Hyperglycemia, unspecified: Secondary | ICD-10-CM

## 2022-07-30 DIAGNOSIS — E860 Dehydration: Secondary | ICD-10-CM | POA: Insufficient documentation

## 2022-07-30 DIAGNOSIS — Z794 Long term (current) use of insulin: Secondary | ICD-10-CM | POA: Insufficient documentation

## 2022-07-30 DIAGNOSIS — Z79899 Other long term (current) drug therapy: Secondary | ICD-10-CM | POA: Insufficient documentation

## 2022-07-30 DIAGNOSIS — E1065 Type 1 diabetes mellitus with hyperglycemia: Secondary | ICD-10-CM | POA: Diagnosis not present

## 2022-07-30 DIAGNOSIS — R55 Syncope and collapse: Secondary | ICD-10-CM

## 2022-07-30 DIAGNOSIS — I10 Essential (primary) hypertension: Secondary | ICD-10-CM | POA: Diagnosis not present

## 2022-07-30 LAB — CBC WITH DIFFERENTIAL/PLATELET
Abs Immature Granulocytes: 0.01 10*3/uL (ref 0.00–0.07)
Basophils Absolute: 0 10*3/uL (ref 0.0–0.1)
Basophils Relative: 1 %
Eosinophils Absolute: 0.1 10*3/uL (ref 0.0–0.5)
Eosinophils Relative: 3 %
HCT: 43.2 % (ref 39.0–52.0)
Hemoglobin: 15 g/dL (ref 13.0–17.0)
Immature Granulocytes: 0 %
Lymphocytes Relative: 23 %
Lymphs Abs: 1.2 10*3/uL (ref 0.7–4.0)
MCH: 31.6 pg (ref 26.0–34.0)
MCHC: 34.7 g/dL (ref 30.0–36.0)
MCV: 91.1 fL (ref 80.0–100.0)
Monocytes Absolute: 0.3 10*3/uL (ref 0.1–1.0)
Monocytes Relative: 6 %
Neutro Abs: 3.4 10*3/uL (ref 1.7–7.7)
Neutrophils Relative %: 67 %
Platelets: 224 10*3/uL (ref 150–400)
RBC: 4.74 MIL/uL (ref 4.22–5.81)
RDW: 12.4 % (ref 11.5–15.5)
WBC: 5 10*3/uL (ref 4.0–10.5)
nRBC: 0 % (ref 0.0–0.2)

## 2022-07-30 LAB — COMPREHENSIVE METABOLIC PANEL
ALT: 15 U/L (ref 0–44)
AST: 16 U/L (ref 15–41)
Albumin: 3.1 g/dL — ABNORMAL LOW (ref 3.5–5.0)
Alkaline Phosphatase: 100 U/L (ref 38–126)
Anion gap: 7 (ref 5–15)
BUN: 30 mg/dL — ABNORMAL HIGH (ref 6–20)
CO2: 27 mmol/L (ref 22–32)
Calcium: 8.8 mg/dL — ABNORMAL LOW (ref 8.9–10.3)
Chloride: 97 mmol/L — ABNORMAL LOW (ref 98–111)
Creatinine, Ser: 1.24 mg/dL (ref 0.61–1.24)
GFR, Estimated: >60 mL/min (ref >60)
Glucose, Bld: 498 mg/dL — ABNORMAL HIGH (ref 70–99)
Potassium: 4.8 mmol/L (ref 3.5–5.1)
Sodium: 131 mmol/L — ABNORMAL LOW (ref 135–145)
Total Bilirubin: 0.6 mg/dL (ref 0.3–1.2)
Total Protein: 6.9 g/dL (ref 6.5–8.1)

## 2022-07-30 LAB — URINALYSIS, ROUTINE W REFLEX MICROSCOPIC
Bacteria, UA: NONE SEEN
Bilirubin Urine: NEGATIVE
Glucose, UA: >=500 mg/dL — AB
Ketones, ur: NEGATIVE mg/dL
Leukocytes,Ua: NEGATIVE
Nitrite: NEGATIVE
Protein, ur: 100 mg/dL — AB
Specific Gravity, Urine: 1.025 (ref 1.005–1.030)
pH: 6 (ref 5.0–8.0)

## 2022-07-30 LAB — CBG MONITORING, ED
Glucose-Capillary: 556 mg/dL (ref 70–99)
Glucose-Capillary: 339 mg/dL — ABNORMAL HIGH (ref 70–99)

## 2022-07-30 LAB — BLOOD GAS, VENOUS
Acid-Base Excess: 3.3 mmol/L — ABNORMAL HIGH (ref 0.0–2.0)
Bicarbonate: 30.6 mmol/L — ABNORMAL HIGH (ref 20.0–28.0)
O2 Saturation: 36.9 %
Patient temperature: 37
pCO2, Ven: 58 mmHg (ref 44–60)
pH, Ven: 7.33 (ref 7.25–7.43)
pO2, Ven: <31 mmHg — CL (ref 32–45)

## 2022-07-30 LAB — BETA-HYDROXYBUTYRIC ACID: Beta-Hydroxybutyric Acid: 0.16 mmol/L (ref 0.05–0.27)

## 2022-07-30 MED ORDER — LACTATED RINGERS IV BOLUS
1000.0000 mL | Freq: Once | INTRAVENOUS | Status: AC
  Administered 2022-07-30: 1000 mL via INTRAVENOUS

## 2022-07-30 NOTE — ED Provider Notes (Signed)
[place] EMERGENCY DEPARTMENT AT [facility] Provider Note   CSN: [id number] Arrival date & time: 07/30/22  [id number]     History  Chief Complaint  Patient presents with   Weakness   Hyperglycemia    [name] is a 37 y.o. male.  HPI       37yo male with history of type I DM, peripheral neuropathy,hypertension, hypothyroidism/autoimmune thyroiditis, retinitis pigmentosa who presents with concern for near syncopal episode.  Reports his glucose was high this morning, he took 10 units of his insulin as he was not anticipating eating breakfast at work, went to work.  He had a bowel movement and began to feel lightheaded, exited the restroom continue to feel lightheaded told his supervisor and lay down with his feet up.  He reports feeling lightheaded like he was going to pass out for a few minutes.  He feels improved now.  He denies any associated chest pain, shortness of breath, nausea, vomiting, headache, numbness, weakness, facial droop.  Denies recent fever, cough, dysuria, medication changes, LOC or bloody stools.  Reports his sugars run high off and on.  Reports he had a prior episode of DKA associated with forgetting his insulin when he went to his grandparents house, and did feel lightheaded at that time.  Since then, he has tried to be more adherent to his insulin regimen. His vision is poor at baseline, reports he does not have peripheral vision at baseline.     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[place])    Diabetes mellitus    Type 1, diagnosed age 79, with frequent DKA admissions, difficult to control due to MR   Diabetic nephropathy ([place])    Started on Lisinopril 5 mg   Diabetic peripheral neuropathy ([facility])    Dysmorphic features    Goiter    Hypertension    Hypoglycemia associated with diabetes ([place])    Hypothyroidism    Impaired cognition    mention of mental retardation   Mental retardation     Moderate or severe vision impairment, both eyes, impairment level not further specified    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 Authorizing Provider  aspirin EC 81 MG tablet Take 1 tablet (81 mg total) by mouth daily. 12/25/18   Wellington Hampshire, MD  atorvastatin (LIPITOR) 10 MG tablet TAKE 1 TABLET BY MOUTH EVERY DAY 08/21/21   Sherrlyn Hock, MD  Insulin Lispro Prot & Lispro (HUMALOG MIX 75/25 KWIKPEN) (75-25) 100 UNIT/ML Kwikpen INJECT 28 UNITS INTO THE SKIN IN THE MORNING AND 24 UNITS IN THE EVENING 07/08/22   Vanessa Kick, MD  Insulin Pen Needle (BD PEN NEEDLE MICRO U/F) 32G X 6 MM MISC USE TO INJECT INSULIN 6 TIMES DAILY. AS NEEDED 07/08/22   Vanessa Kick, MD  lisinopril (ZESTRIL) 10 MG tablet TAKE 1 TABLET BY MOUTH EVERY DAY 02/15/22   Sherrlyn Hock, MD  [facility] VERIO test strip CHECK BLOOD SUGAR 8 TIMES DAILY 11/09/21   Sherrlyn Hock, MD  SYNTHROID 25 MCG tablet TAKE 1 TABLET BY MOUTH EVERY DAY BEFORE BREAKFAST 08/25/21   Sherrlyn Hock, MD      Allergies    Patient has no known allergies.    Review of Systems   Review of Systems  Physical  Exam Updated Vital Signs BP (!) 139/95 (BP Location: Right Arm)   Pulse 79   Temp 97.9 F (36.6 C) (Oral)   Resp 18   Ht '5\' 2"'$  (1.575 m)   Wt 59.9 kg   SpO2 100%   BMI 24.14 kg/m  Physical Exam Vitals and nursing note reviewed.  Constitutional:      General: He is not in acute distress.    Appearance: Normal appearance. He is well-developed. He is not ill-appearing or diaphoretic.  HENT:     Head: Normocephalic and atraumatic.  Eyes:     General: Visual field deficit: reports baseline.     Extraocular Movements: Extraocular movements intact.     Conjunctiva/sclera: Conjunctivae normal.     Pupils: Pupils are equal, round, and reactive to light.  Cardiovascular:     Rate and  Rhythm: Normal rate and regular rhythm.     Pulses: Normal pulses.     Heart sounds: Normal heart sounds. No murmur heard.    No friction rub. No gallop.  Pulmonary:     Effort: Pulmonary effort is normal. No respiratory distress.     Breath sounds: Normal breath sounds. No wheezing or rales.  Abdominal:     General: There is no distension.     Palpations: Abdomen is soft.     Tenderness: There is no abdominal tenderness. There is no guarding.  Musculoskeletal:        General: No swelling or tenderness.     Cervical back: Normal range of motion.  Skin:    General: Skin is warm and dry.     Findings: No erythema or rash.  Neurological:     General: No focal deficit present.     Mental Status: He is alert and oriented to person, place, and time.     GCS: GCS eye subscore is 4. GCS verbal subscore is 5. GCS motor subscore is 6.     Cranial Nerves: No cranial nerve deficit, dysarthria or facial asymmetry.     Sensory: No sensory deficit.     Motor: No weakness or tremor.     Coordination: Coordination normal. Finger-Nose-Finger Test normal.     ED Results / Procedures / Treatments   Labs (all labs ordered are listed, but only abnormal results are displayed) Labs Reviewed  COMPREHENSIVE METABOLIC PANEL - Abnormal; Notable for the following components:      Result Value   Sodium 131 (*)    Chloride 97 (*)    Glucose, Bld 498 (*)    BUN 30 (*)    Calcium 8.8 (*)    Albumin 3.1 (*)    All other components within normal limits  URINALYSIS, ROUTINE W REFLEX MICROSCOPIC - Abnormal; Notable for the following components:   Color, Urine STRAW (*)    Glucose, UA >=500 (*)    Hgb urine dipstick SMALL (*)    Protein, ur 100 (*)    All other components within normal limits  BLOOD GAS, VENOUS - Abnormal; Notable for the following components:   pO2, Ven <31 (*)    Bicarbonate 30.6 (*)    Acid-Base Excess 3.3 (*)    All other components within normal limits  CBG MONITORING, ED -  Abnormal; Notable for the following components:   Glucose-Capillary 556 (*)    All other components within normal limits  CBG MONITORING, ED - Abnormal; Notable for the following components:   Glucose-Capillary 339 (*)    All other components within normal limits  CBC WITH DIFFERENTIAL/PLATELET  BETA-HYDROXYBUTYRIC ACID    EKG EKG Interpretation  Date/Time:  Friday July 30 2022 09:37:08 EST Ventricular Rate:  84 PR Interval:  148 QRS Duration: 85 QT Interval:  374 QTC Calculation: 443 R Axis:   80 Text Interpretation: Sinus rhythm No significant change since last tracing Confirmed by Gareth Morgan [phone]) on 07/30/2022 9:49:32 AM  Radiology No results found.  Procedures Procedures    Medications Ordered in ED Medications  lactated ringers bolus 1,000 mL (0 mLs Intravenous Stopped 07/30/22 1251)    ED Course/ Medical Decision Making/ A&P                               37yo male with history of type I DM, peripheral neuropathy,hypertension, hypothyroidism/autoimmune thyroiditis, retinitis pigmentosa who presents with concern for near syncopal episode.  Differential diagnosis includes anemia, electrolyte abnormality, cardiac abnormality/arrhythmia, infection, other toxic/metabolic abnormalities including DKA, dehydration, vasovagal.  No headache or new focal neurologic concerns on history or exam to suggest CVA, ICH or other central etiology.  No chest pain or shortness of breath and have low suspicion for ACS, PE, aortic dissection, pneumothorax, tamponade.  EKG was completed and personally about interpreted by me showed a normal sinus rhythm without other acute ST changes.  Labs are completed and personally about interpreted by me shows hyperglycemia without DKA, no sign of UTI, no anemia, no clinically significant electrolyte abnormality.  Given IV fluid for hyperglycemia with improvement to 339.   Suspect hyperglycemia due to different adherence to insulin regimen,  taking 10 instead of 28 today. Likely near-syncope vasovagal in setting of BM and exacerbated by dehydration in setting of hyperglycemia.  Feels improved after hydration.  Recommend close PCP follow up.          Final Clinical Impression(s) / ED Diagnoses Final diagnoses:  Dehydration  Near syncope  Hyperglycemia    Rx / DC Orders ED Discharge Orders     None         Gareth Morgan, MD 07/30/22 2125

## 2022-07-30 NOTE — ED Triage Notes (Addendum)
C/o generalized weakness. Was at work when he started feeling faint, checked cbg was 571. Took 15 units humalog this morning. Medics gave 300cc NS en route. T1DM

## 2022-07-30 NOTE — ED Notes (Signed)
Pt has urinal at bedside 

## 2022-08-23 ENCOUNTER — Emergency Department (HOSPITAL_COMMUNITY)
Admission: EM | Admit: 2022-08-23 | Discharge: 2022-08-23 | Disposition: A | Discharge status: Home / Self Care | Payer: 59 | Attending: Emergency Medicine | Admitting: Emergency Medicine

## 2022-08-23 DIAGNOSIS — I10 Essential (primary) hypertension: Secondary | ICD-10-CM | POA: Insufficient documentation

## 2022-08-23 DIAGNOSIS — Z7982 Long term (current) use of aspirin: Secondary | ICD-10-CM | POA: Diagnosis not present

## 2022-08-23 DIAGNOSIS — R42 Dizziness and giddiness: Secondary | ICD-10-CM | POA: Diagnosis present

## 2022-08-23 DIAGNOSIS — Z79899 Other long term (current) drug therapy: Secondary | ICD-10-CM | POA: Insufficient documentation

## 2022-08-23 DIAGNOSIS — Z794 Long term (current) use of insulin: Secondary | ICD-10-CM | POA: Diagnosis not present

## 2022-08-23 DIAGNOSIS — E1065 Type 1 diabetes mellitus with hyperglycemia: Secondary | ICD-10-CM | POA: Insufficient documentation

## 2022-08-23 DIAGNOSIS — R739 Hyperglycemia, unspecified: Secondary | ICD-10-CM

## 2022-08-23 LAB — COMPREHENSIVE METABOLIC PANEL
ALT: 17 U/L (ref 0–44)
AST: 15 U/L (ref 15–41)
Albumin: 3.1 g/dL — ABNORMAL LOW (ref 3.5–5.0)
Alkaline Phosphatase: 65 U/L (ref 38–126)
Anion gap: 5 (ref 5–15)
BUN: 30 mg/dL — ABNORMAL HIGH (ref 6–20)
CO2: 27 mmol/L (ref 22–32)
Calcium: 9 mg/dL (ref 8.9–10.3)
Chloride: 100 mmol/L (ref 98–111)
Creatinine, Ser: 1.45 mg/dL — ABNORMAL HIGH (ref 0.61–1.24)
GFR, Estimated: >60 mL/min (ref >60)
Glucose, Bld: 335 mg/dL — ABNORMAL HIGH (ref 70–99)
Potassium: 5 mmol/L (ref 3.5–5.1)
Sodium: 132 mmol/L — ABNORMAL LOW (ref 135–145)
Total Bilirubin: 0.6 mg/dL (ref 0.3–1.2)
Total Protein: 6.3 g/dL — ABNORMAL LOW (ref 6.5–8.1)

## 2022-08-23 LAB — URINALYSIS, ROUTINE W REFLEX MICROSCOPIC
Bacteria, UA: NONE SEEN
Bilirubin Urine: NEGATIVE
Glucose, UA: >=500 mg/dL — AB
Ketones, ur: NEGATIVE mg/dL
Leukocytes,Ua: NEGATIVE
Nitrite: NEGATIVE
Protein, ur: >=300 mg/dL — AB
Specific Gravity, Urine: 1.022 (ref 1.005–1.030)
pH: 5 (ref 5.0–8.0)

## 2022-08-23 LAB — CBC WITH DIFFERENTIAL/PLATELET
Abs Immature Granulocytes: 0.01 10*3/uL (ref 0.00–0.07)
Basophils Absolute: 0 10*3/uL (ref 0.0–0.1)
Basophils Relative: 0 %
Eosinophils Absolute: 0.1 10*3/uL (ref 0.0–0.5)
Eosinophils Relative: 2 %
HCT: 38.4 % — ABNORMAL LOW (ref 39.0–52.0)
Hemoglobin: 13.3 g/dL (ref 13.0–17.0)
Immature Granulocytes: 0 %
Lymphocytes Relative: 29 %
Lymphs Abs: 1.3 10*3/uL (ref 0.7–4.0)
MCH: 31.7 pg (ref 26.0–34.0)
MCHC: 34.6 g/dL (ref 30.0–36.0)
MCV: 91.6 fL (ref 80.0–100.0)
Monocytes Absolute: 0.3 10*3/uL (ref 0.1–1.0)
Monocytes Relative: 7 %
Neutro Abs: 2.8 10*3/uL (ref 1.7–7.7)
Neutrophils Relative %: 62 %
Platelets: 221 10*3/uL (ref 150–400)
RBC: 4.19 MIL/uL — ABNORMAL LOW (ref 4.22–5.81)
RDW: 12.2 % (ref 11.5–15.5)
WBC: 4.5 10*3/uL (ref 4.0–10.5)
nRBC: 0 % (ref 0.0–0.2)

## 2022-08-23 LAB — BLOOD GAS, VENOUS
Acid-Base Excess: 4.3 mmol/L — ABNORMAL HIGH (ref 0.0–2.0)
Bicarbonate: 31.5 mmol/L — ABNORMAL HIGH (ref 20.0–28.0)
O2 Saturation: 30.7 %
Patient temperature: 37
pCO2, Ven: 57 mmHg (ref 44–60)
pH, Ven: 7.35 (ref 7.25–7.43)
pO2, Ven: <31 mmHg — CL (ref 32–45)

## 2022-08-23 LAB — BETA-HYDROXYBUTYRIC ACID: Beta-Hydroxybutyric Acid: 0.36 mmol/L — ABNORMAL HIGH (ref 0.05–0.27)

## 2022-08-23 LAB — CBG MONITORING, ED
Glucose-Capillary: 352 mg/dL — ABNORMAL HIGH (ref 70–99)
Glucose-Capillary: 293 mg/dL — ABNORMAL HIGH (ref 70–99)
Glucose-Capillary: 185 mg/dL — ABNORMAL HIGH (ref 70–99)
Glucose-Capillary: 89 mg/dL (ref 70–99)

## 2022-08-23 MED ORDER — DEXTROSE 50 % IV SOLN: 0.0000 mL | INTRAVENOUS | Status: DC | PRN

## 2022-08-23 MED ORDER — DEXTROSE IN LACTATED RINGERS 5 % IV SOLN: INTRAVENOUS | Status: DC

## 2022-08-23 MED ORDER — INSULIN REGULAR(HUMAN) IN NACL 100-0.9 UT/100ML-% IV SOLN
INTRAVENOUS | Status: DC
  Administered 2022-08-23: 7 [IU]/h via INTRAVENOUS
  Filled 2022-08-23: qty 100

## 2022-08-23 MED ORDER — LACTATED RINGERS IV SOLN: INTRAVENOUS | Status: DC

## 2022-08-23 NOTE — Discharge Instructions (Addendum)
Please follow-up with your endocrinologist regarding recent symptoms and ER visit.  Please continue to take your insulin as prescribed and take in fluids as I suspect your dehydration today as will cause your sugars to rise.  If symptoms worsen please return to ER.

## 2022-08-23 NOTE — Inpatient Diabetes Management (Signed)
Inpatient Diabetes Program Recommendations  AACE/ADA: New Consensus Statement on Inpatient Glycemic Control (2015)  Target Ranges:  Prepandial:   less than 140 mg/dL      Peak postprandial:   less than 180 mg/dL (1-2 hours)      Critically ill patients:  140 - 180 mg/dL   Lab Results  Component Value Date   GLUCAP 185 (H) 08/23/2022   HGBA1C 9.3 (A) 01/19/2022    Review of Glycemic Control  Diabetes history: DM1 Outpatient Diabetes medications: Humalog 75/25 28 in am and 24 in pm Current orders for Inpatient glycemic control: IV insulin per [employer] for hyperglycemia  HgbA1C - 9.3% on 01/19/22 BHB - 0.36 - slightly elevated  Inpatient Diabetes Program Recommendations:    Need updated HgbA1C  Spoke with pt at bedside. States his mother gave him a lower dose than usual before he went to work today so he wouldn't go low. Said he has hypos occasionally at work, treats with juice/soda.  Said he takes 75/25 30 in am and 22 in pm. Discussed hypoglycemia s/s and treatment.  Pt states he will be discharged from ED. Mother is not here.  To f/u with Endo.   Thank you. Lorenda Peck, RD, LDN, [place] Inpatient Diabetes Coordinator [phone]

## 2022-08-23 NOTE — ED Notes (Signed)
Provided patient with sandwich, cheese, and water

## 2022-08-23 NOTE — ED Triage Notes (Signed)
Pt BIBA from work at [employer] for dizziness due to elevated blood sugar. Mother assists with insulin, not sure how much he normally takes. Been high since this morning.  138/86 HR 82 RR 16 100% RA CBG 365

## 2022-08-23 NOTE — ED Provider Notes (Signed)
[place] EMERGENCY DEPARTMENT AT [facility] Provider Note   CSN: [id number] Arrival date & time: 08/23/22  1010     History  Chief Complaint  Patient presents with   Hyperglycemia   Dizziness    [name] is a 37 y.o. male history of type 1 diabetes with DKA, hypertension presented with dizziness and elevated blood sugars that began today at work.  Patient said he woke up this morning and sugars were in the 400s and his mom gave him 40 units of Humalog and he went to work.  Patient began to experience dizziness in which he felt unstable.  But did not fall or hit his head.  Patient states he has not been taking fluids and began experiencing dizziness when he went from a sitting to a standing position that resolved when he sat back down at work and that was when EMS was called.  EMS stated his blood sugar was 365.  Patient only takes 30 units.  Patient usually takes in the morning is not on any long-acting insulin.  Patient had chest pain, shortness of breath, abdominal pain, dysuria, fever/chills, recent illness, sick contacts, cough  Home Medications Prior to Admission medications   Medication Sig Start Date End Date Taking? Authorizing Provider  aspirin EC 81 MG tablet Take 1 tablet (81 mg total) by mouth daily. 12/25/18   Iran Ouch, MD  atorvastatin (LIPITOR) 10 MG tablet TAKE 1 TABLET BY MOUTH EVERY DAY 08/21/21   David Stall, MD  Insulin Lispro Prot & Lispro (HUMALOG MIX 75/25 KWIKPEN) (75-25) 100 UNIT/ML Kwikpen INJECT 28 UNITS INTO THE SKIN IN THE MORNING AND 24 UNITS IN THE EVENING 07/08/22   Mardella Layman, MD  Insulin Pen Needle (BD PEN NEEDLE MICRO U/F) 32G X 6 MM MISC USE TO INJECT INSULIN 6 TIMES DAILY. AS NEEDED 07/08/22   Mardella Layman, MD  lisinopril (ZESTRIL) 10 MG tablet TAKE 1 TABLET BY MOUTH EVERY DAY 02/15/22   David Stall, MD  [facility] VERIO test strip CHECK BLOOD SUGAR 8 TIMES DAILY 11/09/21   David Stall, MD  SYNTHROID  25 MCG tablet TAKE 1 TABLET BY MOUTH EVERY DAY BEFORE BREAKFAST 08/25/21   David Stall, MD      Allergies    Patient has no known allergies.    Review of Systems   Review of Systems  Neurological:  Positive for dizziness.  See HPI  Physical Exam Updated Vital Signs BP (!) 140/95   Pulse 91   Temp 97.9 F (36.6 C) (Oral)   Resp 20   SpO2 100%  Physical Exam Vitals reviewed.  Constitutional:      General: He is not in acute distress. HENT:     Head: Normocephalic and atraumatic.  Eyes:     Extraocular Movements: Extraocular movements intact.     Conjunctiva/sclera: Conjunctivae normal.     Pupils: Pupils are equal, round, and reactive to light.  Cardiovascular:     Rate and Rhythm: Normal rate and regular rhythm.     Pulses: Normal pulses.     Heart sounds: Normal heart sounds.     Comments: 2+ bilateral radial/dorsalis pedis pulses with regular rate Pulmonary:     Effort: Pulmonary effort is normal. No respiratory distress.     Breath sounds: Normal breath sounds.  Abdominal:     Palpations: Abdomen is soft.     Tenderness: There is no abdominal tenderness. There is no guarding or rebound.  Musculoskeletal:  General: Normal range of motion.     Cervical back: Normal range of motion and neck supple.     Comments: 5 out of 5 bilateral grip/leg extension strength  Skin:    General: Skin is warm and dry.     Capillary Refill: Capillary refill takes less than 2 seconds.  Neurological:     General: No focal deficit present.     Mental Status: He is alert and oriented to person, place, and time.     Comments: Sensation intact in all 4 limbs  Psychiatric:        Mood and Affect: Mood normal.     ED Results / Procedures / Treatments   Labs (all labs ordered are listed, but only abnormal results are displayed) Labs Reviewed  COMPREHENSIVE METABOLIC PANEL - Abnormal; Notable for the following components:      Result Value   Sodium 132 (*)    Glucose, Bld  335 (*)    BUN 30 (*)    Creatinine, Ser 1.45 (*)    Total Protein 6.3 (*)    Albumin 3.1 (*)    All other components within normal limits  CBC WITH DIFFERENTIAL/PLATELET - Abnormal; Notable for the following components:   RBC 4.19 (*)    HCT 38.4 (*)    All other components within normal limits  BLOOD GAS, VENOUS - Abnormal; Notable for the following components:   pO2, Ven <31 (*)    Bicarbonate 31.5 (*)    Acid-Base Excess 4.3 (*)    All other components within normal limits  URINALYSIS, ROUTINE W REFLEX MICROSCOPIC - Abnormal; Notable for the following components:   Glucose, UA >=500 (*)    Hgb urine dipstick SMALL (*)    Protein, ur >=300 (*)    All other components within normal limits  BETA-HYDROXYBUTYRIC ACID - Abnormal; Notable for the following components:   Beta-Hydroxybutyric Acid 0.36 (*)    All other components within normal limits  CBG MONITORING, ED - Abnormal; Notable for the following components:   Glucose-Capillary 352 (*)    All other components within normal limits  CBG MONITORING, ED - Abnormal; Notable for the following components:   Glucose-Capillary 293 (*)    All other components within normal limits  CBG MONITORING, ED - Abnormal; Notable for the following components:   Glucose-Capillary 185 (*)    All other components within normal limits  CBG MONITORING, ED    EKG None  Radiology No results found.  Procedures Procedures    Medications Ordered in ED Medications  insulin regular, human (MYXREDLIN) 100 units/ 100 mL infusion (0 Units/hr Intravenous Stopped 08/23/22 1445)  lactated ringers infusion (0 mLs Intravenous Stopped 08/23/22 1330)  dextrose 5 % in lactated ringers infusion (0 mLs Intravenous Stopped 08/23/22 1445)  dextrose 50 % solution 0-50 mL (has no administration in time range)    ED Course/ Medical Decision Making/ A&P                             Medical Decision Making Amount and/or Complexity of Data Reviewed Labs:  ordered.  Risk Prescription drug management.   [name] 37 y.o. presented today for hyperglycemia. Working DDx that I considered at this time includes, but not limited to, hyperglycemic state, HHS/DKA, dehydration, acidosis, electrolyte abnormalities, viral illness.  R/o DDx: Acidosis, electrolyte imbalance, viral illness, HHS/DKA: These are considered less likely due to history of present illness and physical exam  findings  Review of prior external notes: 07/30/2022 ED  Unique Tests and My Interpretation:  CMP: Glucose 335, increased creatinine 1.45 CBC: Unremarkable Beta-hydroxybutyrate: 0.36 VBG: Not acidotic CBG: 352 UA: Glucose over 500  Discussion with Independent Historian: None  Discussion of Management of Tests: None  Risk: Low: based on diagnostic testing/clinical impression and treatment plan  Risk Stratification Score: None  Plan: Patient presented for elevated glucose and dizziness. On exam patient was no acute distress and stable vitals.  Patient stated he was not feeling dizzy at this time.  When patient sat up patient stated he felt unstable which resolved after lying back down.  I have high suspicion with patient's history and physical exam that patient's dizziness is most likely related to orthostatic hypotension he has not taken many fluids and it is positional and that resolves by going supine.  Labs will be ordered along with fluid and patient will be monitored.  Stable at this time.  On recheck patient's glucose has not dropped to 292 and patient stated his symptoms are improving.  Patient stated that he is able to give a urine and so the nurse was notified to assist him.  Patient stable at this time.  On recheck patient's glucose has dropped to 89 and so patient will be given a sandwich to help his sugar back up little bit.  I suspect patient's elevated glucose today was related to patient not taking in fluids.  Patient's labs at this time are  largely unremarkable and patient's glucose is trending down but the CBGs.  Patient is endorsing improvement of symptoms and pending urinalysis patient will be discharged.  Patient's urine was negative for UTI.  Patient be discharged with endocrinology follow-up.  Patient's endocrinologist and on the system but I spoke with the patient about following up with his endocrinologist and the importance of rechecking his insulin as he may need a long-acting insulin as well.  I spoke to the patient about taking plenty of fluids as I suspect his hypoglycemia today was due to not taking fluids.  Patient was given return precautions. Patient stable for discharge at this time.  Patient verbalized understanding of plan.        Final Clinical Impression(s) / ED Diagnoses Final diagnoses:  Hyperglycemia    Rx / DC Orders ED Discharge Orders     None         Remi Deter 08/23/22 1500    Bethann Berkshire, MD 08/25/22 1106

## 2022-08-30 ENCOUNTER — Ambulatory Visit: Payer: 59 | Admitting: Nurse Practitioner

## 2022-11-26 ENCOUNTER — Encounter (INDEPENDENT_AMBULATORY_CARE_PROVIDER_SITE_OTHER): Payer: Self-pay

## 2023-04-13 NOTE — Progress Notes (Addendum)
 HPI: [name] is seen today for f/u of diabetes.  He lives with his sister.  He is a 37 y.o. male who was diagnosed with diabetes at the age of 37 in 73 or 2002.  He was on a pump in the past but could not manage it due to cognitive issues.  He has tried the [place] in the past but it kept coming off.  I first saw him in 4/24.  At that time I tried to start him on the OP5.  He got the supplies but has not start it.  His grandmother is here with him today.  He is currently on Humalog  75/25, 45-50 units daily (20-30 units with his first meal based on sugar and 25 units with supper).   He uses the Dexcom for CGM.  It was not able to be downloaded. Review of his diet shows that he avoids concentrated carbs.  He denies numbness/tingling/burning in his feet.  He is concerned about his diabetes.     ROS:  No CP. No SOB.  Medical History: Past Medical History:  Diagnosis Date   Diabetes mellitus type I (CMS/HHS-HCC)    Hypertension    Myocardiopathy (CMS/HHS-HCC)    Retinitis pigmentosa 1997    Surgical History: Past Surgical History:  Procedure Laterality Date   CATARACT EXTRACTION      Social History:  reports that he has never smoked. He has never used smokeless tobacco. He reports that he does not use drugs. 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     Family History: family history includes Diabetes type II in his maternal grandmother and paternal grandmother; Heart disease in his mother; Retinopathy of prematurity in an other family member.  Medications: Current Outpatient Medications  Medication Sig Dispense Refill   atorvastatin  (LIPITOR) 10 MG tablet Take 1 tablet (10 mg total) by mouth once daily 90 tablet 4   BD ULTRA-FINE MICRO PEN NEEDLE 32 gauge x 1/4 needle 1 each 2 (two) times daily Up to 6 shots per day. 200 each 4   blood glucose diagnostic (ONETOUCH VERIO TEST STRIPS) test strip 1 each (1 strip total) 3 (three) times daily 300 each 4   DEXCOM G6  SENSOR Devi Use to monitor blood sugar.  Replace every 10 days 9 each 3   DEXCOM G6 TRANSMITTER Devi Use to monitor blood sugar.  Replace every 3 months 1 each 4   HUMALOG  MIX 75-25 KWIKPEN 100 unit/mL (75-25) pen injector 34-36 units before breakfast.  24-26 units before supper.  Max daily dose 62 units 45 mL 4   insulin  LISPRO (ADMELOG , HUMALOG ) injection (concentration 100 units/mL) up to 65 units daily in pump 20 mL 5   lisinopriL  (ZESTRIL ) 10 MG tablet Take 1 tablet (10 mg total) by mouth once daily 90 tablet 4   SYNTHROID  25 mcg tablet Take 1 tablet (25 mcg total) by mouth every morning before breakfast (0630) 90 tablet 4   insulin  pmp cart,aut,G6/7,cntr (OMNIPOD 5 G6-G7 INTRO KT,GEN5,) Crtg Inject 1 each subcutaneously every third day (Patient not taking: Reported on 04/13/2023) 1 each 0   insulin  pump cart,auto,BT-cntr (OMNIPOD 5 G6 INTRO KIT, GEN 5,) Crtg INJECT 1 EACH SUBCUTANEOUSLY EVERY THIRD DAY (Patient not taking: Reported on 04/13/2023) 10 each 12   No current facility-administered medications for this visit.    Allergies: No Known Allergies  Physical Exam: Vitals:   04/13/23 1409  BP: (!) 140/90  Pulse: 96  SpO2: 99%  Weight: 61.4 kg (135 lb  6 oz)  Height: 165.1 cm (5' 5)     Body mass index is 22.53 kg/m. GENERAL: Pleasant, well-appearing male in no distress.    Physical exam otherwise deferred due to coronavirus precautions.   Labs: 09/26/2020:  A1c > 14.  03/04/2021:  A1c = 12.9.  Chol = 160/83/57/86  06/03/2021:  A1c = 13.8 01/19/2022:  A1c = 9.3.  MA = 3267. Chol = 203/169/58/113.  TSH = 5.04.  FT4 = 1.0.  FT3 = 3.1.   08/23/2022:  K/Cr/Ca = 5.0/1.45/9.0.  LFTs nl.  08/26/2022:  A1c = 13.  K/Cr/Ca= 4.5/1.2/8.7.  Chol=192/180/60.5/96.  LFTs nl except alb=3.1.  TSH=3.588.  01/05/2023:  A1c = 11.6 04/13/2023:  A1c = 13.2  Assessment/Plan: 1.  Type 1 Diabetes.  His A1c today is 13.2.  I told them to call the reps to schedule training so he can start on  the OP5 as soon as he can.  I gave him instructions.  I encouraged lifestyle modifications.  I will start him on ~1.1 u/hr for his basal, 1/8 carbs, 1/25 correction.  He doesn't carb count so will have him put in 40 g for breakfast/lunch and 60 with supper.  20 g if he has a snack.  2.  HTN/CKD associated with diabetes.  His BP is ok today on Lisinopril  10 mg daily.  Consider increasing his lisinopril .  3.  HLD associated with diabetes.  His LDL was 96 in 4/24 on Lipitor 10 mg daily.   4.  Hypothyroidism. His TSH in 4/24 was nl on LT4 25 mcg daily.   5.  Neuropathy.  His old records say neuropathy.  We did not discuss this today.  I will plan to ask him about this next visit.  6.  CP.  He was admitted in 6/20 with CP and mildly elevated Troponin levels.  Noninvasive evaluation was negative so cardiology is following him.  Will only plan to do a cath if symptoms recur.    7.  Cognitive issues.  This makes treating his diabetes difficult.  I am hopeful that if we just give him a fixed bolus amount with meals, he will be able to handle the omnipod pump and the automation will help him.  8.  Prophylaxis.  I will plan a foot exam next visit.  He sees Dr. Octavia in [place] [phone]) and says he saw him in 4/24.   9.  He will return to clinic in 3-4 months.  07/20/23:  No show-[place]   This note is partially prepared by Sherrilee Patient, Scribe, in the presence of and acting as the scribe of Dr. Debby Breaker , MD.    [facility], MD

## 2023-09-07 ENCOUNTER — Encounter (HOSPITAL_COMMUNITY): Payer: Self-pay

## 2023-09-07 ENCOUNTER — Inpatient Hospital Stay (HOSPITAL_COMMUNITY)
Admission: EM | Admit: 2023-09-07 | Discharge: 2023-09-09 | DRG: 638 | Disposition: A | Discharge status: Home / Self Care | Payer: MEDICAID | Attending: Internal Medicine | Admitting: Internal Medicine

## 2023-09-07 ENCOUNTER — Ambulatory Visit (HOSPITAL_COMMUNITY)
Admission: EM | Admit: 2023-09-07 | Discharge: 2023-09-07 | Disposition: A | Discharge status: Home / Self Care | Payer: Self-pay | Attending: Emergency Medicine | Admitting: Emergency Medicine

## 2023-09-07 ENCOUNTER — Other Ambulatory Visit: Payer: Self-pay

## 2023-09-07 DIAGNOSIS — E1042 Type 1 diabetes mellitus with diabetic polyneuropathy: Secondary | ICD-10-CM | POA: Diagnosis present

## 2023-09-07 DIAGNOSIS — N179 Acute kidney failure, unspecified: Secondary | ICD-10-CM

## 2023-09-07 DIAGNOSIS — D631 Anemia in chronic kidney disease: Secondary | ICD-10-CM | POA: Diagnosis present

## 2023-09-07 DIAGNOSIS — Z91199 Patient's noncompliance with other medical treatment and regimen due to unspecified reason: Secondary | ICD-10-CM

## 2023-09-07 DIAGNOSIS — I129 Hypertensive chronic kidney disease with stage 1 through stage 4 chronic kidney disease, or unspecified chronic kidney disease: Secondary | ICD-10-CM | POA: Diagnosis present

## 2023-09-07 DIAGNOSIS — E1022 Type 1 diabetes mellitus with diabetic chronic kidney disease: Secondary | ICD-10-CM | POA: Diagnosis present

## 2023-09-07 DIAGNOSIS — Z79890 Hormone replacement therapy (postmenopausal): Secondary | ICD-10-CM

## 2023-09-07 DIAGNOSIS — Z7982 Long term (current) use of aspirin: Secondary | ICD-10-CM

## 2023-09-07 DIAGNOSIS — Z79899 Other long term (current) drug therapy: Secondary | ICD-10-CM

## 2023-09-07 DIAGNOSIS — E8809 Other disorders of plasma-protein metabolism, not elsewhere classified: Secondary | ICD-10-CM | POA: Diagnosis present

## 2023-09-07 DIAGNOSIS — F79 Unspecified intellectual disabilities: Secondary | ICD-10-CM | POA: Diagnosis present

## 2023-09-07 DIAGNOSIS — R739 Hyperglycemia, unspecified: Secondary | ICD-10-CM

## 2023-09-07 DIAGNOSIS — E860 Dehydration: Secondary | ICD-10-CM | POA: Diagnosis present

## 2023-09-07 DIAGNOSIS — E0781 Sick-euthyroid syndrome: Secondary | ICD-10-CM | POA: Diagnosis present

## 2023-09-07 DIAGNOSIS — E1165 Type 2 diabetes mellitus with hyperglycemia: Secondary | ICD-10-CM

## 2023-09-07 DIAGNOSIS — Z821 Family history of blindness and visual loss: Secondary | ICD-10-CM

## 2023-09-07 DIAGNOSIS — E785 Hyperlipidemia, unspecified: Secondary | ICD-10-CM | POA: Diagnosis present

## 2023-09-07 DIAGNOSIS — Z794 Long term (current) use of insulin: Secondary | ICD-10-CM

## 2023-09-07 DIAGNOSIS — E1010 Type 1 diabetes mellitus with ketoacidosis without coma: Principal | ICD-10-CM

## 2023-09-07 DIAGNOSIS — E1110 Type 2 diabetes mellitus with ketoacidosis without coma: Secondary | ICD-10-CM | POA: Diagnosis present

## 2023-09-07 DIAGNOSIS — N1831 Chronic kidney disease, stage 3a: Secondary | ICD-10-CM | POA: Diagnosis present

## 2023-09-07 DIAGNOSIS — Z833 Family history of diabetes mellitus: Secondary | ICD-10-CM

## 2023-09-07 LAB — CBC
HCT: 34.2 % — ABNORMAL LOW (ref 39.0–52.0)
Hemoglobin: 11.8 g/dL — ABNORMAL LOW (ref 13.0–17.0)
MCH: 31.6 pg (ref 26.0–34.0)
MCHC: 34.5 g/dL (ref 30.0–36.0)
MCV: 91.7 fL (ref 80.0–100.0)
Platelets: 286 10*3/uL (ref 150–400)
RBC: 3.73 MIL/uL — ABNORMAL LOW (ref 4.22–5.81)
RDW: 12 % (ref 11.5–15.5)
WBC: 6.8 10*3/uL (ref 4.0–10.5)
nRBC: 0 % (ref 0.0–0.2)

## 2023-09-07 LAB — URINALYSIS, ROUTINE W REFLEX MICROSCOPIC
Bacteria, UA: NONE SEEN
Bilirubin Urine: NEGATIVE
Glucose, UA: >=500 mg/dL — AB
Ketones, ur: 20 mg/dL — AB
Leukocytes,Ua: NEGATIVE
Nitrite: NEGATIVE
Protein, ur: 100 mg/dL — AB
Specific Gravity, Urine: 1.023 (ref 1.005–1.030)
pH: 6 (ref 5.0–8.0)

## 2023-09-07 LAB — CBG MONITORING, ED
Glucose-Capillary: >600 mg/dL (ref 70–99)
Glucose-Capillary: >600 mg/dL (ref 70–99)
Glucose-Capillary: >600 mg/dL (ref 70–99)
Glucose-Capillary: >600 mg/dL (ref 70–99)
Glucose-Capillary: >600 mg/dL (ref 70–99)
Glucose-Capillary: >600 mg/dL (ref 70–99)
Glucose-Capillary: >600 mg/dL (ref 70–99)
Glucose-Capillary: >600 mg/dL (ref 70–99)

## 2023-09-07 LAB — I-STAT VENOUS BLOOD GAS, ED
Acid-base deficit: 6 mmol/L — ABNORMAL HIGH (ref 0.0–2.0)
Bicarbonate: 19.7 mmol/L — ABNORMAL LOW (ref 20.0–28.0)
Calcium, Ion: 1.1 mmol/L — ABNORMAL LOW (ref 1.15–1.40)
HCT: 42 % (ref 39.0–52.0)
Hemoglobin: 14.3 g/dL (ref 13.0–17.0)
O2 Saturation: 88 %
Potassium: 6 mmol/L — ABNORMAL HIGH (ref 3.5–5.1)
Sodium: 117 mmol/L — CL (ref 135–145)
TCO2: 21 mmol/L — ABNORMAL LOW (ref 22–32)
pCO2, Ven: 39.5 mmHg — ABNORMAL LOW (ref 44–60)
pH, Ven: 7.306 (ref 7.25–7.43)
pO2, Ven: 59 mmHg — ABNORMAL HIGH (ref 32–45)

## 2023-09-07 LAB — POCT URINALYSIS DIP (MANUAL ENTRY)
Bilirubin, UA: NEGATIVE
Glucose, UA: 500 mg/dL — AB
Leukocytes, UA: NEGATIVE
Nitrite, UA: NEGATIVE
Protein Ur, POC: >=300 mg/dL — AB
Spec Grav, UA: 1.01
Urobilinogen, UA: 0.2 U/dL
pH, UA: 5.5

## 2023-09-07 LAB — I-STAT CHEM 8, ED
BUN: 49 mg/dL — ABNORMAL HIGH (ref 6–20)
Calcium, Ion: 1.13 mmol/L — ABNORMAL LOW (ref 1.15–1.40)
Chloride: 91 mmol/L — ABNORMAL LOW (ref 98–111)
Creatinine, Ser: 1.9 mg/dL — ABNORMAL HIGH (ref 0.61–1.24)
Glucose, Bld: >700 mg/dL (ref 70–99)
HCT: 43 % (ref 39.0–52.0)
Hemoglobin: 14.6 g/dL (ref 13.0–17.0)
Potassium: 5.6 mmol/L — ABNORMAL HIGH (ref 3.5–5.1)
Sodium: 119 mmol/L — CL (ref 135–145)
TCO2: 19 mmol/L — ABNORMAL LOW (ref 22–32)

## 2023-09-07 LAB — COMPREHENSIVE METABOLIC PANEL WITH GFR
ALT: 18 U/L (ref 0–44)
AST: 16 U/L (ref 15–41)
Albumin: 2.2 g/dL — ABNORMAL LOW (ref 3.5–5.0)
Alkaline Phosphatase: 113 U/L (ref 38–126)
Anion gap: 21 — ABNORMAL HIGH (ref 5–15)
BUN: 41 mg/dL — ABNORMAL HIGH (ref 6–20)
CO2: 13 mmol/L — ABNORMAL LOW (ref 22–32)
Calcium: 8.8 mg/dL — ABNORMAL LOW (ref 8.9–10.3)
Chloride: 87 mmol/L — ABNORMAL LOW (ref 98–111)
Creatinine, Ser: 2.28 mg/dL — ABNORMAL HIGH (ref 0.61–1.24)
GFR, Estimated: 37 mL/min — ABNORMAL LOW (ref >60)
Glucose, Bld: 1045 mg/dL (ref 70–99)
Potassium: 5.4 mmol/L — ABNORMAL HIGH (ref 3.5–5.1)
Sodium: 121 mmol/L — ABNORMAL LOW (ref 135–145)
Total Bilirubin: 0.9 mg/dL (ref 0.0–1.2)
Total Protein: 5.5 g/dL — ABNORMAL LOW (ref 6.5–8.1)

## 2023-09-07 LAB — POCT FASTING CBG KUC MANUAL ENTRY: POCT Glucose (KUC): 600 mg/dL — AB (ref 70–99)

## 2023-09-07 MED ORDER — INSULIN REGULAR(HUMAN) IN NACL 100-0.9 UT/100ML-% IV SOLN
INTRAVENOUS | Status: DC
  Filled 2023-09-07: qty 100

## 2023-09-07 MED ORDER — LACTATED RINGERS IV BOLUS
20.0000 mL/kg | Freq: Once | INTRAVENOUS | Status: AC
  Administered 2023-09-07: 1198 mL via INTRAVENOUS

## 2023-09-07 MED ORDER — INSULIN REGULAR(HUMAN) IN NACL 100-0.9 UT/100ML-% IV SOLN
INTRAVENOUS | Status: DC
  Administered 2023-09-07: 6 [IU]/h via INTRAVENOUS
  Administered 2023-09-08: 0.9 [IU]/h via INTRAVENOUS

## 2023-09-07 MED ORDER — ENOXAPARIN SODIUM 40 MG/0.4ML IJ SOSY
40.0000 mg | PREFILLED_SYRINGE | INTRAMUSCULAR | Status: DC
  Administered 2023-09-07 – 2023-09-08 (×2): 40 mg via SUBCUTANEOUS
  Filled 2023-09-07 (×2): qty 0.4

## 2023-09-07 MED ORDER — DEXTROSE IN LACTATED RINGERS 5 % IV SOLN: INTRAVENOUS | Status: AC

## 2023-09-07 MED ORDER — PROCHLORPERAZINE EDISYLATE 10 MG/2ML IJ SOLN: 5.0000 mg | Freq: Four times a day (QID) | INTRAMUSCULAR | Status: DC | PRN

## 2023-09-07 MED ORDER — DEXTROSE 50 % IV SOLN: 0.0000 mL | INTRAVENOUS | Status: DC | PRN

## 2023-09-07 MED ORDER — DEXTROSE IN LACTATED RINGERS 5 % IV SOLN: INTRAVENOUS | Status: DC

## 2023-09-07 MED ORDER — LACTATED RINGERS IV SOLN: INTRAVENOUS | Status: AC

## 2023-09-07 MED ORDER — LACTATED RINGERS IV SOLN: INTRAVENOUS | Status: DC

## 2023-09-07 NOTE — ED Triage Notes (Signed)
 Pt is a type 1 diabetic. Family member states he hasn't had his sugar checked in over a week and has been noncompliant with meds. Patient c/o sleepiness and vomiting.

## 2023-09-07 NOTE — ED Provider Triage Note (Signed)
 Emergency Medicine Provider Triage Evaluation Note  [name] , a 38 y.o. male  was evaluated in triage.  Pt complains of high blood sugar.  Patient states he has been forgetting to take his insulin recently.  Recently got a new which is noncompatible with his Dexcom.  Endorses increased thirst as well as intermittent nausea and vomiting for the past couple of days.  Review of Systems  Positive: Hyperglycemia, increased thirst Negative: Fevers, abdominal pain  Physical Exam  BP (!) 146/95   Pulse (!) 108   Temp 97.9 F (36.6 C)   Resp 18   Ht 5\' 2"  (1.575 m)   Wt 59.9 kg   SpO2 100%   BMI 24.15 kg/m  Gen:   Awake, no distress   Resp:  Normal effort  MSK:   Moves extremities without difficulty  Other:    Medical Decision Making  Medically screening exam initiated at 2:25 PM.  Appropriate orders placed.  [name] was informed that the remainder of the evaluation will be completed by another provider, this initial triage assessment does not replace that evaluation, and the importance of remaining in the ED until their evaluation is complete.    Sonnie Dusky, PA-C 09/07/23 1426

## 2023-09-07 NOTE — ED Notes (Signed)
 Patient is being discharged from the Urgent Care and sent to the Emergency Department via POV . Per [place]  G., NP, patient is in need of higher level of care due to high blood sugar. Patient is aware and verbalizes understanding of plan of care.  Vitals:   09/07/23 1334  BP: (!) 160/96  Pulse: 95  Resp: 18  Temp: 98 F (36.7 C)  SpO2: 98%

## 2023-09-07 NOTE — Discharge Instructions (Signed)
 Head to [facility] Emergency Department for further evaluation, your blood sugar was over the readable limit of 600. This is very dangerous and can be deadly.

## 2023-09-07 NOTE — ED Notes (Signed)
Water given  

## 2023-09-07 NOTE — ED Triage Notes (Signed)
 Pt states had his blood sugar checked and the glucometer read over 600 per family. Pt c/o increased thirst, N/V today.

## 2023-09-07 NOTE — H&P (Signed)
 History and Physical    [name] ZOX:[id number] DOB: Dec 30, 1985 DOA: 09/07/2023  PCP: Pcp, No   Patient coming from: Home   Chief Complaint: N/V, polydipsia  HPI: [name] is a 38 y.o. male with medical history significant for type 1 diabetes mellitus and hypothyroidism who presents with nausea, vomiting, polydipsia, and lethargy in the setting of nonadherence with his insulin for the past week.  Patient reports that he has not used insulin or checked his sugars in close to a week now.  He has offered various explanations of this but states that he has insulin and all of the supplies that he needs available at home.  He has developed polydipsia, nausea, and was vomiting this morning.  He denies any associated abdominal pain, fever, or chills.  ED Course: Upon arrival to the ED, patient is found to be afebrile and saturating well on room air with mild tachycardia and elevated blood pressure.  Glucose is >700, serum bicarbonate is 19, anion gap not yet resulted, and ketonuria is noted.  Patient was given a bolus of LR and started on IV insulin in the ED.  Review of Systems:  All other systems reviewed and apart from HPI, are negative.  Past Medical History:  Diagnosis Date   Angiopathy, diabetic (HCC)    Diabetes mellitus    Type 1, diagnosed age 21, with frequent DKA admissions, difficult to control due to MR   Diabetic nephropathy (HCC)    Started on Lisinopril 5 mg   Diabetic peripheral neuropathy (HCC)    Dysmorphic features    Goiter    Hypertension    Hypoglycemia associated with diabetes (HCC)    Hypothyroidism    Impaired cognition    mention of mental retardation   Mental retardation    Moderate or severe vision impairment, both eyes, impairment level not further specified    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   Thyroiditis, autoimmune     Past Surgical History:  Procedure Laterality Date   DENTAL SURGERY     EYE  SURGERY      Social History:   reports that he has never smoked. He has never used smokeless tobacco. He reports that he does not drink alcohol and does not use drugs.  No Known Allergies  Family History  Problem Relation Age of Onset   Vision loss Father    Retinitis pigmentosa Mother    Diabetes Maternal Grandmother        AODM   Diabetes Paternal Grandmother        AODM   Diabetes Cousin        Second cousin has juvenile-onset DM.     Prior to Admission medications   Medication Sig Start Date End Date Taking? Authorizing Provider  aspirin EC 81 MG tablet Take 1 tablet (81 mg total) by mouth daily. 12/25/18   Iran Ouch, MD  atorvastatin (LIPITOR) 10 MG tablet TAKE 1 TABLET BY MOUTH EVERY DAY 08/21/21   David Stall, MD  Insulin Lispro Prot & Lispro (HUMALOG MIX 75/25 KWIKPEN) (75-25) 100 UNIT/ML Kwikpen INJECT 28 UNITS INTO THE SKIN IN THE MORNING AND 24 UNITS IN THE EVENING 07/08/22   Mardella Layman, MD  Insulin Pen Needle (BD PEN NEEDLE MICRO U/F) 32G X 6 MM MISC USE TO INJECT INSULIN 6 TIMES DAILY. AS NEEDED 07/08/22   Mardella Layman, MD  lisinopril (ZESTRIL) 10 MG tablet TAKE 1 TABLET BY MOUTH EVERY DAY  02/15/22   Williemae Harsh, MD  [facility] VERIO test strip CHECK BLOOD SUGAR 8 TIMES DAILY 11/09/21   Williemae Harsh, MD  SYNTHROID 25 MCG tablet TAKE 1 TABLET BY MOUTH EVERY DAY BEFORE BREAKFAST 08/25/21   Williemae Harsh, MD    Physical Exam: Vitals:   09/07/23 1412 09/07/23 1412 09/07/23 1806  BP:  (!) 146/95 (!) 150/96  Pulse:  (!) 108 90  Resp:  18 17  Temp:  97.9 F (36.6 C) 97.8 F (36.6 C)  TempSrc:   Oral  SpO2:  100% 100%  Weight: 59.9 kg    Height: 5\' 2"  (1.575 m)       Constitutional: NAD, no pallor or diaphoresis   Eyes: PERTLA, lids and conjunctivae normal ENMT: Mucous membranes are dry. Posterior pharynx clear of any exudate or lesions.   Neck: supple, no masses  Respiratory: no wheezing, no crackles. No accessory muscle use.   Cardiovascular: S1 & S2 heard, regular rate and rhythm. No extremity edema. No significant JVD. Abdomen: No distension, no tenderness, soft. Bowel sounds active.  Musculoskeletal: no clubbing / cyanosis. No joint deformity upper and lower extremities.   Skin: no significant rashes, lesions, ulcers. Warm, dry, well-perfused. Neurologic: CN 2-12 grossly intact. Moving all extremities. Alert and oriented.  Psychiatric: Calm. Cooperative.    Labs and Imaging on Admission: I have personally reviewed following labs and imaging studies  CBC: Recent Labs  Lab 09/07/23 1452 09/07/23 1456  HGB 14.6 14.3  HCT 43.0 42.0   Basic Metabolic Panel: Recent Labs  Lab 09/07/23 1452 09/07/23 1456  NA 119* 117*  K 5.6* 6.0*  CL 91*  --   GLUCOSE >700*  --   BUN 49*  --   CREATININE 1.90*  --    GFR: Estimated Creatinine Clearance: 41.9 mL/min (A) (by C-G formula based on SCr of 1.9 mg/dL (H)). Liver Function Tests: No results for input(s): "AST", "ALT", "ALKPHOS", "BILITOT", "PROT", "ALBUMIN" in the last 168 hours. No results for input(s): "LIPASE", "AMYLASE" in the last 168 hours. No results for input(s): "AMMONIA" in the last 168 hours. Coagulation Profile: No results for input(s): "INR", "PROTIME" in the last 168 hours. Cardiac Enzymes: No results for input(s): "CKTOTAL", "CKMB", "CKMBINDEX", "TROPONINI" in the last 168 hours. BNP (last 3 results) No results for input(s): "PROBNP" in the last 8760 hours. HbA1C: No results for input(s): "HGBA1C" in the last 72 hours. CBG: Recent Labs  Lab 09/07/23 1415 09/07/23 1826  GLUCAP >600* >600*   Lipid Profile: No results for input(s): "CHOL", "HDL", "LDLCALC", "TRIG", "CHOLHDL", "LDLDIRECT" in the last 72 hours. Thyroid Function Tests: No results for input(s): "TSH", "T4TOTAL", "FREET4", "T3FREE", "THYROIDAB" in the last 72 hours. Anemia Panel: No results for input(s): "VITAMINB12", "FOLATE", "FERRITIN", "TIBC", "IRON", "RETICCTPCT" in  the last 72 hours. Urine analysis:    Component Value Date/Time   COLORURINE STRAW (A) 09/07/2023 1417   APPEARANCEUR CLEAR 09/07/2023 1417   LABSPEC 1.023 09/07/2023 1417   PHURINE 6.0 09/07/2023 1417   GLUCOSEU >=500 (A) 09/07/2023 1417   HGBUR SMALL (A) 09/07/2023 1417   BILIRUBINUR NEGATIVE 09/07/2023 1417   BILIRUBINUR negative 09/07/2023 1350   KETONESUR 20 (A) 09/07/2023 1417   KETONESUR moderate (40) (A) 09/07/2023 1350   PROTEINUR 100 (A) 09/07/2023 1417   PROTEINUR >=300 (A) 09/07/2023 1350   UROBILINOGEN 0.2 09/07/2023 1350   UROBILINOGEN 1.0 05/12/2014 1159   NITRITE NEGATIVE 09/07/2023 1417   NITRITE Negative 09/07/2023 1350   LEUKOCYTESUR NEGATIVE 09/07/2023  1417   LEUKOCYTESUR Negative 09/07/2023 1350   Sepsis Labs: @LABRCNTIP (procalcitonin:4,lacticidven:4) )No results found for this or any previous visit (from the past 240 hours).   Radiological Exams on Admission: No results found.   Assessment/Plan   1. DKA  - Due to non-adherence with insulin  - Continue IVF hydration and IV insulin infusion with frequent CBGs and serial chem panels, consult diabetes coordinator   2. AKI  - Acute prerenal injury in setting of DKA   - Continue IVF hydration and glycemic-control, renally-dose medications, repeat chem panel    DVT prophylaxis: Lovenox  Code Status: Full  Level of Care: Level of care: Progressive Family Communication: Mother at bedside   Disposition Plan:  Patient is from: home  Anticipated d/c is to: Home  Anticipated d/c date is: 09/09/23  Patient currently: Pending glycemic-control  Consults called: None  Admission status: Inpatient     Walton Guppy, MD [facility]  09/07/2023, 7:14 PM

## 2023-09-07 NOTE — ED Notes (Signed)
 Unable to obtain labs

## 2023-09-07 NOTE — ED Provider Notes (Signed)
 [place] EMERGENCY DEPARTMENT AT [facility] Provider Note   CSN: [id number] Arrival date & time: 09/07/23  1406     History  No chief complaint on file.   [place] Churchill Milson is a 38 y.o. male with PMH as listed below who presents with high blood sugar.  Patient states he has been forgetting to take his insulin recently.  Recently got a new phone which is noncompatible with his Dexcom.  Endorses increased thirst as well as intermittent nausea and vomiting for the past couple of days. Denies f/c, cough, SOB, chest pain, abdominal pain, urinary sxs, diarrhea/constipation.     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 Authorizing Provider  aspirin EC 81 MG tablet Take 1 tablet (81 mg total) by mouth daily. 12/25/18  Yes Wenona Hamilton, MD  Insulin Lispro Prot & Lispro (HUMALOG MIX 75/25 KWIKPEN) (75-25) 100 UNIT/ML Kwikpen INJECT 28 UNITS INTO THE SKIN IN THE MORNING AND 24 UNITS IN THE EVENING 07/08/22  Yes Hagler, Polly Brink, MD  atorvastatin (LIPITOR) 10 MG tablet TAKE 1 TABLET BY MOUTH EVERY DAY Patient not taking: Reported on 09/07/2023 08/21/21   Brennan, Michael J, MD  Insulin Pen Needle (BD PEN NEEDLE MICRO U/F) 32G X 6 MM MISC USE TO INJECT INSULIN 6 TIMES DAILY. AS  NEEDED 07/08/22   Afton Albright, MD  lisinopril (ZESTRIL) 10 MG tablet TAKE 1 TABLET BY MOUTH EVERY DAY Patient not taking: Reported on 09/07/2023 02/15/22   Williemae Harsh, MD  [facility] VERIO test strip CHECK BLOOD SUGAR 8 TIMES DAILY 11/09/21   Brennan, Michael J, MD  SYNTHROID 25 MCG tablet TAKE 1 TABLET BY MOUTH EVERY DAY BEFORE BREAKFAST Patient not taking: Reported on 09/07/2023 08/25/21   Williemae Harsh, MD      Allergies    Patient has no known allergies.    Review of Systems   Review of Systems A 10 point review of systems was performed and is negative unless otherwise reported in HPI.  Physical Exam Updated Vital Signs BP 120/86 (BP Location: Left Arm)   Pulse (!) 102   Temp 97.9 F (36.6 C) (Oral)   Resp 19   Ht 5\' 2"  (1.575 m)   Wt 59.9 kg   SpO2 100%   BMI 24.15 kg/m  Physical Exam General: Normal appearing male, lying in bed.  HEENT: Sclera anicteric, dry mucous membranes, trachea midline.  Cardiology: RRR, no murmurs/rubs/gallops.  Resp: Normal respiratory rate and effort. CTAB, no wheezes, rhonchi, crackles.  Abd: Soft, non-tender, non-distended. No rebound tenderness or guarding.  GU: Deferred. MSK: No peripheral edema or signs of trauma. Extremities without deformity or TTP. No cyanosis or  clubbing. Skin: warm, dry.  Neuro: A&Ox4, CNs II-XII grossly intact. MAEs. Sensation grossly intact.  Psych: Normal mood and affect.   ED Results / Procedures / Treatments   Labs (all labs ordered are listed, but only abnormal results are displayed) Labs Reviewed  URINALYSIS, ROUTINE W REFLEX MICROSCOPIC - Abnormal; Notable for the following components:      Result Value   Color, Urine STRAW (*)    Glucose, UA >=500 (*)    Hgb urine dipstick SMALL (*)    Ketones, ur 20 (*)    Protein, ur 100 (*)    All other components within normal limits  COMPREHENSIVE METABOLIC PANEL WITH GFR - Abnormal; Notable for the following components:   Sodium 121 (*)    Potassium  5.4 (*)    Chloride 87 (*)    CO2 13 (*)    Glucose, Bld 1,045 (*)    BUN 41 (*)    Creatinine, Ser 2.28 (*)    Calcium 8.8 (*)    Total Protein 5.5 (*)    Albumin 2.2 (*)    GFR, Estimated 37 (*)    Anion gap 21 (*)    All other components within normal limits  CBG MONITORING, ED - Abnormal; Notable for the following components:   Glucose-Capillary >600 (*)    All other components within normal limits  CBG MONITORING, ED - Abnormal; Notable for the following components:   Glucose-Capillary >600 (*)    All other components within normal limits  I-STAT VENOUS BLOOD GAS, ED - Abnormal; Notable for the following components:   pCO2, Ven 39.5 (*)    pO2, Ven 59 (*)    Bicarbonate 19.7 (*)    TCO2 21 (*)    Acid-base deficit 6.0 (*)    Sodium 117 (*)    Potassium 6.0 (*)    Calcium, Ion 1.10 (*)    All other components within normal limits  I-STAT CHEM 8, ED - Abnormal; Notable for the following components:   Sodium 119 (*)    Potassium 5.6 (*)    Chloride 91 (*)    BUN 49 (*)    Creatinine, Ser 1.90 (*)    Glucose, Bld >700 (*)    Calcium, Ion 1.13 (*)    TCO2 19 (*)    All other components within normal limits  CBG MONITORING, ED - Abnormal; Notable for the following components:   Glucose-Capillary >600 (*)    All other components within normal limits  CBG MONITORING, ED - Abnormal; Notable for the following components:   Glucose-Capillary >600 (*)    All other components within normal limits  CBG MONITORING, ED - Abnormal; Notable for the following components:   Glucose-Capillary >600 (*)    All other components within normal limits  CBG MONITORING, ED - Abnormal; Notable for the following components:   Glucose-Capillary >600 (*)    All other components within normal limits  CBG MONITORING, ED - Abnormal; Notable for the following components:   Glucose-Capillary >600 (*)    All other components within normal limits  CBC  HIV ANTIBODY (ROUTINE TESTING W REFLEX)   BASIC METABOLIC PANEL WITH GFR  BASIC METABOLIC PANEL WITH GFR  BASIC METABOLIC PANEL WITH GFR  HEMOGLOBIN A1C    EKG None  Radiology No results found.  Procedures .Critical Care  Performed by: Merdis Stalling, MD Authorized by: Merdis Stalling, MD   Critical care provider statement:    Critical care time (minutes):  30  Critical care was necessary to treat or prevent imminent or life-threatening deterioration of the following conditions:  Endocrine crisis   Critical care was time spent personally by me on the following activities:  Development of treatment plan with patient or surrogate, evaluation of patient's response to treatment, examination of patient, ordering and review of laboratory studies, ordering and review of radiographic studies, ordering and performing treatments and interventions, pulse oximetry, re-evaluation of patient's condition, review of old charts and obtaining history from patient or surrogate   Care discussed with: admitting provider       Medications Ordered in ED Medications  enoxaparin (LOVENOX) injection 40 mg (has no administration in time range)  insulin regular, human (MYXREDLIN) 100 units/ 100 mL infusion (6 Units/hr Intravenous New Bag/Given 09/07/23 2038)  lactated ringers infusion ( Intravenous New Bag/Given 09/07/23 2039)  dextrose 5 % in lactated ringers infusion (has no administration in time range)  dextrose 50 % solution 0-50 mL (has no administration in time range)  prochlorperazine (COMPAZINE) injection 5 mg (has no administration in time range)  lactated ringers bolus 1,198 mL (1,198 mLs Intravenous New Bag/Given 09/07/23 1823)    ED Course/ Medical Decision Making/ A&P                          Medical Decision Making Amount and/or Complexity of Data Reviewed Labs: ordered. Decision-making details documented in ED Course.  Risk Decision regarding hospitalization.    This patient presents to the ED for concern of high blood  glucose, N/V, this involves an extensive number of treatment options, and is a complaint that carries with it a high risk of complications and morbidity.  I considered the following differential and admission for this acute, potentially life threatening condition. Overall patient is nontoxic appearing.   MDM:    Patient with glucose >700 on istat, +ketonuria on UA, low bicarb 13, anion gap 21. Patient with pH 7.306 but respiratory compensation noted. Patient in DKA. Ordered fluids and insulin. K 5.6. No localizing infectious symptoms. Likely a result of noncompliance.    Clinical Course as of 09/07/23 2256  Wed Sep 07, 2023  1706 Glucose(!!): >700 [HN]  1707 Ketones, ur(!): 20 +ketonuria [HN]  1707 Urinalysis, Routine w reflex microscopic -Urine, Clean Catch(!) No UTI [HN]  1707 Sodium(!!): 119 W/ high glucose [HN]  1708 pH, Ven: 7.306 Respiratory compensation for metabolic acidosis [HN]  1708 Potassium(!): 5.6 +hyperkalemia [HN]  1712 Called lab, they don't have the CBC/CMP/beta hydroxy that was sent at 2:46. Will have RN resend.  [HN]    Clinical Course User Index [HN] Merdis Stalling, MD    Labs: I Ordered, and personally interpreted labs.  The pertinent results include:  those listed above  Additional history obtained from chart review.   Cardiac Monitoring: The patient was maintained on a cardiac monitor.  I personally viewed and interpreted the cardiac monitored which showed an underlying rhythm of: NSR or sinus tachycardia  Reevaluation: After the interventions noted above, I reevaluated the patient and found that they have :improved  Social Determinants of Health: Lives independently  Disposition:  admitted to Dr. Brice Campi  Co morbidities that complicate the patient evaluation  Past Medical History:  Diagnosis Date   Angiopathy, diabetic (HCC)    Diabetes mellitus    Type 1, diagnosed age 38, with frequent DKA admissions, difficult to control due to MR   Diabetic  nephropathy (HCC)    Started on Lisinopril 5 mg  Diabetic peripheral neuropathy (HCC)    Dysmorphic features    Goiter    Hypertension    Hypoglycemia associated with diabetes (HCC)    Hypothyroidism    Impaired cognition    mention of mental retardation   Mental retardation    Moderate or severe vision impairment, both eyes, impairment level not further specified    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   Thyroiditis, autoimmune      Medicines Meds ordered this encounter  Medications   lactated ringers bolus 1,198 mL   DISCONTD: lactated ringers infusion   DISCONTD: dextrose 5 % in lactated ringers infusion   DISCONTD: dextrose 50 % solution 0-50 mL   DISCONTD: insulin regular, human (MYXREDLIN) 100 units/ 100 mL infusion    EndoTool Goal Range::   140-180    Type of Diabetes:   Type 1    Mode of Therapy:   ENDOX1 for DKA    Start Method:   EndoTool to calculate   enoxaparin (LOVENOX) injection 40 mg   insulin regular, human (MYXREDLIN) 100 units/ 100 mL infusion    EndoTool Goal Range::   140-180    Type of Diabetes:   Type 1    Mode of Therapy:   ENDOX1 for DKA    Start Method:   EndoTool to calculate   lactated ringers infusion   dextrose 5 % in lactated ringers infusion   dextrose 50 % solution 0-50 mL   prochlorperazine (COMPAZINE) injection 5 mg    I have reviewed the patients home medicines and have made adjustments as needed  Problem List / ED Course: Problem List Items Addressed This Visit       Endocrine   * (Principal) DKA (diabetic ketoacidosis) (HCC) - Primary   Relevant Medications   insulin regular, human (MYXREDLIN) 100 units/ 100 mL infusion                This note was created using dictation software, which may contain spelling or grammatical errors.    Merdis Stalling, MD 09/07/23 2300
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
## 2023-09-08 ENCOUNTER — Inpatient Hospital Stay (HOSPITAL_COMMUNITY): Payer: MEDICAID

## 2023-09-08 DIAGNOSIS — E871 Hypo-osmolality and hyponatremia: Secondary | ICD-10-CM

## 2023-09-08 DIAGNOSIS — M7989 Other specified soft tissue disorders: Secondary | ICD-10-CM

## 2023-09-08 LAB — GLUCOSE, CAPILLARY
Glucose-Capillary: 216 mg/dL — ABNORMAL HIGH (ref 70–99)
Glucose-Capillary: 185 mg/dL — ABNORMAL HIGH (ref 70–99)
Glucose-Capillary: 281 mg/dL — ABNORMAL HIGH (ref 70–99)

## 2023-09-08 LAB — CBC WITH DIFFERENTIAL/PLATELET
Abs Immature Granulocytes: 0.01 10*3/uL (ref 0.00–0.07)
Basophils Absolute: 0 10*3/uL (ref 0.0–0.1)
Basophils Relative: 0 %
Eosinophils Absolute: 0.1 10*3/uL (ref 0.0–0.5)
Eosinophils Relative: 2 %
HCT: 32.2 % — ABNORMAL LOW (ref 39.0–52.0)
Hemoglobin: 11.1 g/dL — ABNORMAL LOW (ref 13.0–17.0)
Immature Granulocytes: 0 %
Lymphocytes Relative: 26 %
Lymphs Abs: 1.7 10*3/uL (ref 0.7–4.0)
MCH: 31.4 pg (ref 26.0–34.0)
MCHC: 34.5 g/dL (ref 30.0–36.0)
MCV: 91.2 fL (ref 80.0–100.0)
Monocytes Absolute: 0.5 10*3/uL (ref 0.1–1.0)
Monocytes Relative: 7 %
Neutro Abs: 4.3 10*3/uL (ref 1.7–7.7)
Neutrophils Relative %: 65 %
Platelets: 234 10*3/uL (ref 150–400)
RBC: 3.53 MIL/uL — ABNORMAL LOW (ref 4.22–5.81)
RDW: 12.4 % (ref 11.5–15.5)
WBC: 6.6 10*3/uL (ref 4.0–10.5)
nRBC: 0 % (ref 0.0–0.2)

## 2023-09-08 LAB — BASIC METABOLIC PANEL WITH GFR
Anion gap: 15 (ref 5–15)
Anion gap: 11 (ref 5–15)
Anion gap: 11 (ref 5–15)
Anion gap: 9 (ref 5–15)
BUN: 39 mg/dL — ABNORMAL HIGH (ref 6–20)
BUN: 35 mg/dL — ABNORMAL HIGH (ref 6–20)
BUN: 29 mg/dL — ABNORMAL HIGH (ref 6–20)
BUN: 27 mg/dL — ABNORMAL HIGH (ref 6–20)
CO2: 18 mmol/L — ABNORMAL LOW (ref 22–32)
CO2: 24 mmol/L (ref 22–32)
CO2: 22 mmol/L (ref 22–32)
CO2: 26 mmol/L (ref 22–32)
Calcium: 8.6 mg/dL — ABNORMAL LOW (ref 8.9–10.3)
Calcium: 8.7 mg/dL — ABNORMAL LOW (ref 8.9–10.3)
Calcium: 8.1 mg/dL — ABNORMAL LOW (ref 8.9–10.3)
Calcium: 8.5 mg/dL — ABNORMAL LOW (ref 8.9–10.3)
Chloride: 95 mmol/L — ABNORMAL LOW (ref 98–111)
Chloride: 99 mmol/L (ref 98–111)
Chloride: 102 mmol/L (ref 98–111)
Chloride: 101 mmol/L (ref 98–111)
Creatinine, Ser: 2.27 mg/dL — ABNORMAL HIGH (ref 0.61–1.24)
Creatinine, Ser: 1.94 mg/dL — ABNORMAL HIGH (ref 0.61–1.24)
Creatinine, Ser: 1.59 mg/dL — ABNORMAL HIGH (ref 0.61–1.24)
Creatinine, Ser: 1.61 mg/dL — ABNORMAL HIGH (ref 0.61–1.24)
GFR, Estimated: 38 mL/min — ABNORMAL LOW (ref >60)
GFR, Estimated: 45 mL/min — ABNORMAL LOW (ref >60)
GFR, Estimated: 58 mL/min — ABNORMAL LOW (ref >60)
GFR, Estimated: 57 mL/min — ABNORMAL LOW (ref >60)
Glucose, Bld: 792 mg/dL (ref 70–99)
Glucose, Bld: 370 mg/dL — ABNORMAL HIGH (ref 70–99)
Glucose, Bld: 661 mg/dL (ref 70–99)
Glucose, Bld: 255 mg/dL — ABNORMAL HIGH (ref 70–99)
Potassium: 4.1 mmol/L (ref 3.5–5.1)
Potassium: 3.9 mmol/L (ref 3.5–5.1)
Potassium: 3.6 mmol/L (ref 3.5–5.1)
Potassium: 4.3 mmol/L (ref 3.5–5.1)
Sodium: 128 mmol/L — ABNORMAL LOW (ref 135–145)
Sodium: 134 mmol/L — ABNORMAL LOW (ref 135–145)
Sodium: 135 mmol/L (ref 135–145)
Sodium: 136 mmol/L (ref 135–145)

## 2023-09-08 LAB — COMPREHENSIVE METABOLIC PANEL WITH GFR
ALT: 11 U/L (ref 0–44)
AST: 13 U/L — ABNORMAL LOW (ref 15–41)
Albumin: 1.7 g/dL — ABNORMAL LOW (ref 3.5–5.0)
Alkaline Phosphatase: 58 U/L (ref 38–126)
Anion gap: 7 (ref 5–15)
BUN: 32 mg/dL — ABNORMAL HIGH (ref 6–20)
CO2: 27 mmol/L (ref 22–32)
Calcium: 8.4 mg/dL — ABNORMAL LOW (ref 8.9–10.3)
Chloride: 102 mmol/L (ref 98–111)
Creatinine, Ser: 1.79 mg/dL — ABNORMAL HIGH (ref 0.61–1.24)
GFR, Estimated: 50 mL/min — ABNORMAL LOW (ref >60)
Glucose, Bld: 180 mg/dL — ABNORMAL HIGH (ref 70–99)
Potassium: 3.7 mmol/L (ref 3.5–5.1)
Sodium: 136 mmol/L (ref 135–145)
Total Bilirubin: 0.6 mg/dL (ref 0.0–1.2)
Total Protein: 4.2 g/dL — ABNORMAL LOW (ref 6.5–8.1)

## 2023-09-08 LAB — CBG MONITORING, ED
Glucose-Capillary: 135 mg/dL — ABNORMAL HIGH (ref 70–99)
Glucose-Capillary: 145 mg/dL — ABNORMAL HIGH (ref 70–99)
Glucose-Capillary: 148 mg/dL — ABNORMAL HIGH (ref 70–99)
Glucose-Capillary: 170 mg/dL — ABNORMAL HIGH (ref 70–99)
Glucose-Capillary: 187 mg/dL — ABNORMAL HIGH (ref 70–99)
Glucose-Capillary: 213 mg/dL — ABNORMAL HIGH (ref 70–99)
Glucose-Capillary: 225 mg/dL — ABNORMAL HIGH (ref 70–99)
Glucose-Capillary: 316 mg/dL — ABNORMAL HIGH (ref 70–99)
Glucose-Capillary: 368 mg/dL — ABNORMAL HIGH (ref 70–99)
Glucose-Capillary: 488 mg/dL — ABNORMAL HIGH (ref 70–99)
Glucose-Capillary: 541 mg/dL (ref 70–99)
Glucose-Capillary: 552 mg/dL (ref 70–99)

## 2023-09-08 LAB — PHOSPHORUS: Phosphorus: 2.3 mg/dL — ABNORMAL LOW (ref 2.5–4.6)

## 2023-09-08 LAB — BETA-HYDROXYBUTYRIC ACID: Beta-Hydroxybutyric Acid: 0.36 mmol/L — ABNORMAL HIGH (ref 0.05–0.27)

## 2023-09-08 LAB — MAGNESIUM: Magnesium: 2 mg/dL (ref 1.7–2.4)

## 2023-09-08 MED ORDER — INSULIN ASPART 100 UNIT/ML IJ SOLN
0.0000 [IU] | Freq: Every day | INTRAMUSCULAR | Status: DC
  Administered 2023-09-08: 3 [IU] via SUBCUTANEOUS

## 2023-09-08 MED ORDER — ASPIRIN 81 MG PO TBEC
81.0000 mg | DELAYED_RELEASE_TABLET | Freq: Every day | ORAL | Status: DC
  Administered 2023-09-08 – 2023-09-09 (×2): 81 mg via ORAL
  Filled 2023-09-08 (×2): qty 1

## 2023-09-08 MED ORDER — LACTATED RINGERS IV BOLUS
1000.0000 mL | Freq: Once | INTRAVENOUS | Status: AC
Start: 2023-09-08 — End: 2023-09-08
  Administered 2023-09-08: 1000 mL via INTRAVENOUS

## 2023-09-08 MED ORDER — ATORVASTATIN CALCIUM 10 MG PO TABS
10.0000 mg | ORAL_TABLET | Freq: Every day | ORAL | Status: DC
  Administered 2023-09-08 – 2023-09-09 (×2): 10 mg via ORAL
  Filled 2023-09-08 (×2): qty 1

## 2023-09-08 MED ORDER — LISINOPRIL 5 MG PO TABS
10.0000 mg | ORAL_TABLET | Freq: Every day | ORAL | Status: DC
  Administered 2023-09-08 – 2023-09-09 (×2): 10 mg via ORAL
  Filled 2023-09-08 (×2): qty 2

## 2023-09-08 MED ORDER — INSULIN ASPART 100 UNIT/ML IJ SOLN
3.0000 [IU] | Freq: Three times a day (TID) | INTRAMUSCULAR | Status: DC
  Administered 2023-09-08 – 2023-09-09 (×3): 3 [IU] via SUBCUTANEOUS

## 2023-09-08 MED ORDER — INSULIN GLARGINE-YFGN 100 UNIT/ML ~~LOC~~ SOLN
15.0000 [IU] | Freq: Every day | SUBCUTANEOUS | Status: DC
  Administered 2023-09-08: 15 [IU] via SUBCUTANEOUS
  Filled 2023-09-08 (×2): qty 0.15

## 2023-09-08 MED ORDER — INSULIN ASPART 100 UNIT/ML IJ SOLN
0.0000 [IU] | Freq: Three times a day (TID) | INTRAMUSCULAR | Status: DC
  Administered 2023-09-08: 2 [IU] via SUBCUTANEOUS
  Administered 2023-09-09: 3 [IU] via SUBCUTANEOUS
  Administered 2023-09-09: 9 [IU] via SUBCUTANEOUS

## 2023-09-08 MED ORDER — K PHOS MONO-SOD PHOS DI & MONO 155-852-130 MG PO TABS
500.0000 mg | ORAL_TABLET | Freq: Two times a day (BID) | ORAL | Status: AC
Start: 2023-09-08 — End: 2023-09-08
  Administered 2023-09-08 (×2): 500 mg via ORAL
  Filled 2023-09-08 (×2): qty 2

## 2023-09-08 MED ORDER — LEVOTHYROXINE SODIUM 25 MCG PO TABS
25.0000 ug | ORAL_TABLET | Freq: Every day | ORAL | Status: DC
  Administered 2023-09-09: 25 ug via ORAL
  Filled 2023-09-08: qty 1

## 2023-09-08 NOTE — Progress Notes (Signed)
 PROGRESS NOTE    [name]  NGE:[id number] DOB: 1986-03-14 DOA: 09/07/2023 PCP: Pcp, No   Brief Narrative:  The patient [name] is a 38 y.o. male with medical history significant for type 1 diabetes mellitus and hypothyroidism who presents with nausea, vomiting, polydipsia, and lethargy in the setting of nonadherence with his insulin for the past week.   Patient reports that he has not used insulin or checked his sugars in close to a week now.  He has offered various explanations of this but states that he has insulin and all of the supplies that he needs available at home.   Mated for DKA and placed on insulin drip but now weaned off and transition long-acting.  Awaiting diabetes education coordinator evaluation.  Does have an AKI on CKD but this is improving.   Assessment and Plan:  DKA in the setting of Uncontrolled Diabetes Mellitus Type 1: In the setting of Non-compliance. Insulin gtt initiated and will transition to sq Semglee 15 units, Sensitive Novolog AC/HS, and Novolog  3 units TID. Holding home Humalog 75/25 mix with 28 units in the skin in the morning and 24 units in the evening.  Consulted diabetes education coronary will check hemoglobin A1c in AM.  Given a 0.2 L lactated ringer bolus and was on LR 125 but will discontinue this and changed to D5 and LR at 125 mL/h for 1 day.  Resume Lisinopril as below.  Continue supportive care and continue with antiemetics with prochlorperazine 5 mg IV every 6 as needed for nausea vomiting  Psuedohyponatremia: In the setting of Hyperglycemia: Resolved. Na+ Trend:  Recent Labs  Lab 09/07/23 1452 09/07/23 1456 09/07/23 1745 09/07/23 2300 09/08/23 0328 09/08/23 0735 09/08/23 1030  NA 119* 117* 121* 128* 134* 135 136  CTM and Trend and repeat CMP in the AM  AKI on CKD Stage 3a:  Baseline around 1.1-1.3. BUN/Cr Trend: Recent Labs  Lab 09/07/23 1452 09/07/23 1745 09/07/23 2300 09/08/23 0328 09/08/23 0735  09/08/23 1030  BUN 49* 41* 39* 35* 29* 32*  CREATININE 1.90* 2.28* 2.27* 1.94* 1.59* 1.79*  -Avoid Nephrotoxic Medications, Contrast Dyes, Hypotension and Dehydration to Ensure Adequate Renal Perfusion and will need to Renally Adjust Med -Continue to Monitor and Trend Renal Function carefully and repeat CMP in the AM   HLD: Resume Atorvastatin 10 mg po daily  Hypothyroidism: Has a history of autoimmune thyroiditis and history of goiter with hypothyroidism check TSH in the AM; C/w Levothyroxine 25 mcg po Daily  Essential HTN: Resume ACE-I with Lisinopril 10 mg po daily. CTM BP per Protocol. Last BP reading is 155/100.   LE Swelling: Check LE Venous Duplex bilaterally. Likely 2/2 to Hypoalbuminemia.   Hypophosphatemia: Phos is 2.3. Replete with po K Phos Neutral 500 mg po BID x2. CTM and replete as Necessary. Repeat Phos Level in the AM  Normocytic Anemia: Likely dilutional drop and likely was hemoconcentrated on admission due to dehydration. Hgb/Hct went from 14.3/42.0 -> 11.8/34.2 -> 11.1/32.2. Check Anemia Panel in the AM. CTM for S/Sx of Bleeding; No overt bleeding noted. Repeat CBC in the Am   Hypoalbuminemia: Patient's Albumin went from 2.2 -> 1.7. CTM and Trend and repeat CMP in the AM   DVT prophylaxis: enoxaparin (LOVENOX) injection 40 mg Start: 09/07/23 2000    Code Status: Full Code Family Communication:  No family present at bedside   Disposition Plan:  Level of care: Progressive Status is: Inpatient Remains inpatient appropriate because: Needs to ensure tolerance  of diet and adequate Insulin regimen for D/C   Consultants:  None  Procedures:  As delineated as above   Antimicrobials:  Anti-infectives (From admission, onward)    None       Subjective: Seen and examined at bedside he was doing okay and felt a little bit better.  States he was feeling a bit hungry today.  No nausea or vomiting now and states his symptoms have resolved.  Denies any lightheadedness or  dizziness.  No other concerns or complaints at this time.  Objective: Vitals:   09/08/23 1100 09/08/23 1104 09/08/23 1200 09/08/23 1250  BP: 137/89  (!) 144/95 (!) 155/100  Pulse: 71  74 83  Resp: 15  11 15   Temp:  (!) 97.5 F (36.4 C)  97.6 F (36.4 C)  TempSrc:  Oral  Oral  SpO2: 100%  100% 100%  Weight:      Height:        Intake/Output Summary (Last 24 hours) at 09/08/2023 1403 Last data filed at 09/08/2023 0551 Gross per 24 hour  Intake --  Output 280 ml  Net -280 ml   Filed Weights   09/07/23 1412  Weight: 59.9 kg   Examination: Physical Exam:  Constitutional: WN/WD, Caucasian male in NAD Respiratory: Diminished to auscultation bilaterally with coarse breath sounds, no wheezing, rales, rhonchi or crackles. Normal respiratory effort and patient is not tachypenic. No accessory muscle use. Unlabored breathing  Cardiovascular: RRR, no murmurs / rubs / gallops. S1 and S2 auscultated. Has 1+ LE edema  Abdomen: Soft, non-tender, non-distended. Bowel sounds positive.  GU: Deferred. Musculoskeletal: No clubbing / cyanosis of digits/nails. No joint deformity upper and lower extremities.  Skin: No rashes, lesions, ulcers. No induration; Warm and dry.  Neurologic: CN 2-12 grossly intact with no focal deficits. Romberg sign and cerebellar reflexes not assessed.  Psychiatric: A little lethargic but awake and alert  Data Reviewed: I have personally reviewed following labs and imaging studies  CBC: Recent Labs  Lab 09/07/23 1452 09/07/23 1456 09/07/23 1845 09/08/23 0852  WBC  --   --  6.8 6.6  NEUTROABS  --   --   --  4.3  HGB 14.6 14.3 11.8* 11.1*  HCT 43.0 42.0 34.2* 32.2*  MCV  --   --  91.7 91.2  PLT  --   --  286 234   Basic Metabolic Panel: Recent Labs  Lab 09/07/23 1745 09/07/23 2300 09/08/23 0328 09/08/23 0735 09/08/23 1030  NA 121* 128* 134* 135 136  K 5.4* 4.1 3.9 3.6 3.7  CL 87* 95* 99 102 102  CO2 13* 18* 24 22 27   GLUCOSE 1,045* 792* 370* 661*  180*  BUN 41* 39* 35* 29* 32*  CREATININE 2.28* 2.27* 1.94* 1.59* 1.79*  CALCIUM 8.8* 8.6* 8.7* 8.1* 8.4*  MG  --   --   --   --  2.0  PHOS  --   --   --   --  2.3*   GFR: Estimated Creatinine Clearance: 44.5 mL/min (A) (by C-G formula based on SCr of 1.79 mg/dL (H)). Liver Function Tests: Recent Labs  Lab 09/07/23 1745 09/08/23 1030  AST 16 13*  ALT 18 11  ALKPHOS 113 58  BILITOT 0.9 0.6  PROT 5.5* 4.2*  ALBUMIN 2.2* 1.7*   No results for input(s): "LIPASE", "AMYLASE" in the last 168 hours. No results for input(s): "AMMONIA" in the last 168 hours. Coagulation Profile: No results for input(s): "INR", "PROTIME" in the last 168  hours. Cardiac Enzymes: No results for input(s): "CKTOTAL", "CKMB", "CKMBINDEX", "TROPONINI" in the last 168 hours. BNP (last 3 results) No results for input(s): "PROBNP" in the last 8760 hours. HbA1C: No results for input(s): "HGBA1C" in the last 72 hours. CBG: Recent Labs  Lab 09/08/23 0654 09/08/23 0755 09/08/23 1000 09/08/23 1209 09/08/23 1305  GLUCAP 135* 148* 145* 170* 187*   Lipid Profile: No results for input(s): "CHOL", "HDL", "LDLCALC", "TRIG", "CHOLHDL", "LDLDIRECT" in the last 72 hours. Thyroid Function Tests: No results for input(s): "TSH", "T4TOTAL", "FREET4", "T3FREE", "THYROIDAB" in the last 72 hours. Anemia Panel: No results for input(s): "VITAMINB12", "FOLATE", "FERRITIN", "TIBC", "IRON", "RETICCTPCT" in the last 72 hours. Sepsis Labs: No results for input(s): "PROCALCITON", "LATICACIDVEN" in the last 168 hours.  No results found for this or any previous visit (from the past 240 hours).   Radiology Studies: No results found.  Scheduled Meds:  aspirin EC  81 mg Oral Daily   atorvastatin  10 mg Oral Daily   enoxaparin (LOVENOX) injection  40 mg Subcutaneous Q24H   insulin aspart  0-5 Units Subcutaneous QHS   insulin aspart  0-9 Units Subcutaneous TID WC   insulin aspart  3 Units Subcutaneous TID WC   insulin  glargine-yfgn  15 Units Subcutaneous Daily   [START ON 09/09/2023] levothyroxine  25 mcg Oral Q0600   lisinopril  10 mg Oral Daily   phosphorus  500 mg Oral BID   Continuous Infusions:  dextrose 5% lactated ringers 125 mL/hr at 09/08/23 1211   insulin 1.7 Units/hr (09/08/23 1215)   lactated ringers Stopped (09/08/23 0440)    LOS: 1 day   Aura Leeds, DO [facility] Available via Epic secure chat 7am-7pm After these hours, please refer to coverage provider listed on [email or web address] 09/08/2023, 2:03 PM

## 2023-09-08 NOTE — Plan of Care (Signed)

## 2023-09-08 NOTE — ED Notes (Addendum)
 BMP and betahydroxy sent out at [phone]

## 2023-09-08 NOTE — Progress Notes (Signed)
 BLE venous duplex has been completed.   Results can be found under chart review under CV PROC. 09/08/2023 8:10 PM Tashara Suder RVT, RDMS

## 2023-09-08 NOTE — ED Notes (Addendum)
 Insulin stopped per endotool. See mar. CBG repeat required at 7:55 am

## 2023-09-08 NOTE — ED Notes (Signed)
 CBG check next @1411 

## 2023-09-08 NOTE — ED Notes (Signed)
 Patient cbg 148, per endotool started insulin at 0.9unit/h

## 2023-09-08 NOTE — ED Notes (Signed)
 CCMD called to admit for monitoring

## 2023-09-08 NOTE — Hospital Course (Addendum)
 The patient [name] is a 38 y.o. male with medical history significant for type 1 diabetes mellitus and hypothyroidism who presents with nausea, vomiting, polydipsia, and lethargy in the setting of nonadherence with his insulin for the past week.   Patient reports that he has not used insulin or checked his sugars in close to a week now.  He has offered various explanations of this but states that he has insulin and all of the supplies that he needs available at home.   Mated for DKA and placed on insulin drip but now weaned off and transition long-acting.  Awaiting diabetes education coordinator evaluation.  Does have an AKI on CKD but this is improving.   Assessment and Plan:  DKA in the setting of Uncontrolled Diabetes Mellitus Type 1: In the setting of Non-compliance. Insulin gtt initiated and will transition to sq Semglee 15 units, Sensitive Novolog AC/HS, and Novolog  3 units TID. Holding home Humalog 75/25 mix with 28 units in the skin in the morning and 24 units in the evening.  Consulted diabetes education coronary will check hemoglobin A1c in AM.  Given a 0.2 L lactated ringer bolus and was on LR 125 but will discontinue this and changed to D5 and LR at 125 mL/h for 1 day.  Resume Lisinopril as below.  Continue supportive care and continue with antiemetics with prochlorperazine 5 mg IV every 6 as needed for nausea vomiting  Psuedohyponatremia: In the setting of Hyperglycemia: Resolved. Na+ Trend:  Recent Labs  Lab 09/07/23 1452 09/07/23 1456 09/07/23 1745 09/07/23 2300 09/08/23 0328 09/08/23 0735 09/08/23 1030  NA 119* 117* 121* 128* 134* 135 136  CTM and Trend and repeat CMP in the AM  AKI on CKD Stage 3a:  Baseline around 1.1-1.3. BUN/Cr Trend: Recent Labs  Lab 09/07/23 1452 09/07/23 1745 09/07/23 2300 09/08/23 0328 09/08/23 0735 09/08/23 1030  BUN 49* 41* 39* 35* 29* 32*  CREATININE 1.90* 2.28* 2.27* 1.94* 1.59* 1.79*  -Avoid Nephrotoxic Medications,  Contrast Dyes, Hypotension and Dehydration to Ensure Adequate Renal Perfusion and will need to Renally Adjust Med -Continue to Monitor and Trend Renal Function carefully and repeat CMP in the AM   HLD: Resume Atorvastatin 10 mg po daily  Hypothyroidism: Has a history of autoimmune thyroiditis and history of goiter with hypothyroidism check TSH in the AM; C/w Levothyroxine 25 mcg po Daily  Essential HTN: Resume ACE-I with Lisinopril 10 mg po daily. CTM BP per Protocol. Last BP reading is 155/100.   LE Swelling: Check LE Venous Duplex bilaterally. Likely 2/2 to Hypoalbuminemia.   Hypophosphatemia: Phos is 2.3. Replete with po K Phos Neutral 500 mg po BID x2. CTM and replete as Necessary. Repeat Phos Level in the AM  Normocytic Anemia: Likely dilutional drop and likely was hemoconcentrated on admission due to dehydration. Hgb/Hct went from 14.3/42.0 -> 11.8/34.2 -> 11.1/32.2. Check Anemia Panel in the AM. CTM for S/Sx of Bleeding; No overt bleeding noted. Repeat CBC in the Am   Hypoalbuminemia: Patient's Albumin went from 2.2 -> 1.7. CTM and Trend and repeat CMP in the AM

## 2023-09-08 NOTE — Inpatient Diabetes Management (Signed)
 Inpatient Diabetes Program Recommendations  AACE/ADA: New Consensus Statement on Inpatient Glycemic Control (2015)  Target Ranges:  Prepandial:   less than 140 mg/dL      Peak postprandial:   less than 180 mg/dL (1-2 hours)      Critically ill patients:  140 - 180 mg/dL   Lab Results  Component Value Date   GLUCAP 187 (H) 09/08/2023   HGBA1C 9.3 (A) 01/19/2022    Review of Glycemic Control  Diabetes history: type 2 (diagnosed at age 38) Outpatient Diabetes medications: 75/25 insulin 30 units in am, 25 units at HS Current orders for Inpatient glycemic control: Semglee 15 units daily, Novolog 0-9 units correction scale TID, Novolog 0-5 units HS scale, Novolog 3 units TID with meals  Inpatient Diabetes Program Recommendations:   Spoke with patient about his diabetes. Was diagnosed at age 86. HgbA1C is still pending. Patient states that he takes 75/25 insulin 30 units in am, 25 units at HS. States that the last week he had bought a new cell phone (android) and was not able to use his Dexcom G6 sensor with the new phone. He states that he may want a reader to read the sensor if his phone does not work. Patient states that he lives with a number of people in the same household. He states that he cannot get them to help him remember to take his insulin. He is able to get his insulin without issues financially. He does see an endocrinologist, but it has been some time since he has seen them. He will make an appointment to follow up with that doctor. Patient was not aware of his extreme high blood sugar on admission.  Discharge Recommendations: Other recommendations: Dexcom G7 sensor (order # [id number])   Dexcom G7 receiver (order # [id number])   Use Adult Diabetes Insulin Treatment Post Discharge order set.  Will continue to monitor blood sugars while in the hospital.  Nick Barman RN BSN CDE Diabetes Coordinator Pager: [phone]  8am-5pm

## 2023-09-08 NOTE — ED Notes (Addendum)
 Labs sent

## 2023-09-08 NOTE — ED Notes (Signed)
 Per endotool insulin started at 1.2units/h, recheck glucose in 2 hours. Provider notified.

## 2023-09-09 ENCOUNTER — Other Ambulatory Visit (HOSPITAL_COMMUNITY): Payer: Self-pay

## 2023-09-09 LAB — CBC WITH DIFFERENTIAL/PLATELET
Abs Immature Granulocytes: 0.01 10*3/uL (ref 0.00–0.07)
Basophils Absolute: 0 10*3/uL (ref 0.0–0.1)
Basophils Relative: 1 %
Eosinophils Absolute: 0.1 10*3/uL (ref 0.0–0.5)
Eosinophils Relative: 2 %
HCT: 31.6 % — ABNORMAL LOW (ref 39.0–52.0)
Hemoglobin: 11 g/dL — ABNORMAL LOW (ref 13.0–17.0)
Immature Granulocytes: 0 %
Lymphocytes Relative: 30 %
Lymphs Abs: 1.9 10*3/uL (ref 0.7–4.0)
MCH: 31.8 pg (ref 26.0–34.0)
MCHC: 34.8 g/dL (ref 30.0–36.0)
MCV: 91.3 fL (ref 80.0–100.0)
Monocytes Absolute: 0.3 10*3/uL (ref 0.1–1.0)
Monocytes Relative: 5 %
Neutro Abs: 4 10*3/uL (ref 1.7–7.7)
Neutrophils Relative %: 62 %
Platelets: 260 10*3/uL (ref 150–400)
RBC: 3.46 MIL/uL — ABNORMAL LOW (ref 4.22–5.81)
RDW: 12.8 % (ref 11.5–15.5)
WBC: 6.3 10*3/uL (ref 4.0–10.5)
nRBC: 0 % (ref 0.0–0.2)

## 2023-09-09 LAB — PHOSPHORUS: Phosphorus: 3 mg/dL (ref 2.5–4.6)

## 2023-09-09 LAB — COMPREHENSIVE METABOLIC PANEL WITH GFR
ALT: 12 U/L (ref 0–44)
AST: 12 U/L — ABNORMAL LOW (ref 15–41)
Albumin: <1.5 g/dL — ABNORMAL LOW (ref 3.5–5.0)
Alkaline Phosphatase: 57 U/L (ref 38–126)
Anion gap: 7 (ref 5–15)
BUN: 27 mg/dL — ABNORMAL HIGH (ref 6–20)
CO2: 26 mmol/L (ref 22–32)
Calcium: 8.2 mg/dL — ABNORMAL LOW (ref 8.9–10.3)
Chloride: 103 mmol/L (ref 98–111)
Creatinine, Ser: 1.57 mg/dL — ABNORMAL HIGH (ref 0.61–1.24)
GFR, Estimated: 59 mL/min — ABNORMAL LOW (ref >60)
Glucose, Bld: 210 mg/dL — ABNORMAL HIGH (ref 70–99)
Potassium: 3.6 mmol/L (ref 3.5–5.1)
Sodium: 136 mmol/L (ref 135–145)
Total Bilirubin: 0.6 mg/dL (ref 0.0–1.2)
Total Protein: 4.2 g/dL — ABNORMAL LOW (ref 6.5–8.1)

## 2023-09-09 LAB — TSH: TSH: 5.288 u[IU]/mL — ABNORMAL HIGH (ref 0.350–4.500)

## 2023-09-09 LAB — FOLATE: Folate: 7.9 ng/mL (ref >5.9)

## 2023-09-09 LAB — RETICULOCYTES
Immature Retic Fract: 5.2 % (ref 2.3–15.9)
RBC.: 3.48 MIL/uL — ABNORMAL LOW (ref 4.22–5.81)
Retic Count, Absolute: 55.7 10*3/uL (ref 19.0–186.0)
Retic Ct Pct: 1.6 % (ref 0.4–3.1)

## 2023-09-09 LAB — GLUCOSE, CAPILLARY
Glucose-Capillary: 403 mg/dL — ABNORMAL HIGH (ref 70–99)
Glucose-Capillary: 250 mg/dL — ABNORMAL HIGH (ref 70–99)

## 2023-09-09 LAB — MISC LABCORP TEST (SEND OUT): Labcorp test code: 83935

## 2023-09-09 LAB — VITAMIN B12: Vitamin B-12: 697 pg/mL (ref 180–914)

## 2023-09-09 LAB — HEMOGLOBIN A1C
Hgb A1c MFr Bld: 15.3 % — ABNORMAL HIGH (ref 4.8–5.6)
Mean Plasma Glucose: 392 mg/dL

## 2023-09-09 LAB — MAGNESIUM: Magnesium: 1.7 mg/dL (ref 1.7–2.4)

## 2023-09-09 LAB — IRON AND TIBC
Iron: 76 ug/dL (ref 45–182)
Saturation Ratios: 45 % — ABNORMAL HIGH (ref 17.9–39.5)
TIBC: 168 ug/dL — ABNORMAL LOW (ref 250–450)
UIBC: 92 ug/dL

## 2023-09-09 LAB — FERRITIN: Ferritin: 207 ng/mL (ref 24–336)

## 2023-09-09 MED ORDER — DEXCOM G7 SENSOR MISC
0 refills | Status: DC
  Filled 2023-09-09: qty 1, 10d supply, fill #0

## 2023-09-09 MED ORDER — INSULIN ASPART PROT & ASPART (70-30 MIX) 100 UNIT/ML ~~LOC~~ SUSP
24.0000 [IU] | Freq: Two times a day (BID) | SUBCUTANEOUS | Status: DC
  Administered 2023-09-09: 24 [IU] via SUBCUTANEOUS
  Filled 2023-09-09: qty 10

## 2023-09-09 MED ORDER — ADULT MULTIVITAMIN W/MINERALS CH: 1.0000 | ORAL_TABLET | Freq: Every day | ORAL | Status: DC

## 2023-09-09 MED ORDER — DEXCOM G7 RECEIVER DEVI
0 refills | Status: DC
  Filled 2023-09-09: qty 1, 30d supply, fill #0

## 2023-09-09 NOTE — Plan of Care (Signed)
  Problem: Education: Goal: Ability to describe self-care measures that may prevent or decrease complications (Diabetes Survival Skills Education) will improve Outcome: Progressing   Problem: Coping: Goal: Ability to adjust to condition or change in health will improve Outcome: Progressing   Problem: Fluid Volume: Goal: Ability to maintain a balanced intake and output will improve Outcome: Progressing   Problem: Health Behavior/Discharge Planning: Goal: Ability to identify and utilize available resources and services will improve Outcome: Progressing Goal: Ability to manage health-related needs will improve Outcome: Progressing   Problem: Metabolic: Goal: Ability to maintain appropriate glucose levels will improve Outcome: Progressing   Problem: Health Behavior/Discharge Planning: Goal: Ability to identify and utilize available resources and services will improve Outcome: Progressing Goal: Ability to manage health-related needs will improve Outcome: Progressing

## 2023-09-09 NOTE — Discharge Instructions (Signed)
 Follow with Primary MD/endocrinologist in 7 days  Get CBC, CMP, checked  by Primary MD next visit.    Activity: As tolerated with Full fall precautions use walker/cane & assistance as needed   Disposition Home    Diet: Carb modified   On your next visit with your primary care physician please Get Medicines reviewed and adjusted.   Please request your Prim.MD to go over all Hospital Tests and Procedure/Radiological results at the follow up, please get all Hospital records sent to your Prim MD by signing hospital release before you go home.   If you experience worsening of your admission symptoms, develop shortness of breath, life threatening emergency, suicidal or homicidal thoughts you must seek medical attention immediately by calling 911 or calling your MD immediately  if symptoms less severe.  You Must read complete instructions/literature along with all the possible adverse reactions/side effects for all the Medicines you take and that have been prescribed to you. Take any new Medicines after you have completely understood and accpet all the possible adverse reactions/side effects.   Do not drive, operating heavy machinery, perform activities at heights, swimming or participation in water activities or provide baby sitting services if your were admitted for syncope or siezures until you have seen by Primary MD or a Neurologist and advised to do so again.  Do not drive when taking Pain medications.    Do not take more than prescribed Pain, Sleep and Anxiety Medications  Special Instructions: If you have smoked or chewed Tobacco  in the last 2 yrs please stop smoking, stop any regular Alcohol  and or any Recreational drug use.  Wear Seat belts while driving.   Please note  You were cared for by a hospitalist during your hospital stay. If you have any questions about your discharge medications or the care you received while you were in the hospital after you are discharged, you  can call the unit and asked to speak with the hospitalist on call if the hospitalist that took care of you is not available. Once you are discharged, your primary care physician will handle any further medical issues. Please note that NO REFILLS for any discharge medications will be authorized once you are discharged, as it is imperative that you return to your primary care physician (or establish a relationship with a primary care physician if you do not have one) for your aftercare needs so that they can reassess your need for medications and monitor your lab values.

## 2023-09-09 NOTE — TOC CM/SW Note (Signed)
  [employer] C[employer] Pharmacies please call [phone] for claim processing assistance.  Rx BIN: [id number] Rx Group: [id number] Rx PCN: [id number] Relationship Code: 1 Person Code: 01  Patient ID (MRN): [id number]      Patient Name: [name]   Patient DOB: Sep 25, 1985   Discharge Date:09/09/2023  Expiration Date: 09/16/2023 (must be filled within 7 days of discharge)   Dear Elene Griffes have been approved to have the prescriptions written by your discharging physician filled through our [facility] (Medication Assistance Through [facility]) program. This program allows for a one-time (no refills) 34-day supply of selected medications for a low copay amount.  The copay is $3.00 per prescription. For instance, if you have one prescription, you will pay $3.00; for two prescriptions, you pay $6.00; for three prescriptions, you pay $9.00; and so on. Only certain pharmacies are participating in this program with [facility]. You will need to select one of the pharmacies from the attached lists and take your prescriptions, this letter, and your photo ID to one of the participating pharmacies.  We are excited that you are able to use the [facility] program to get your medications. These prescriptions must be filled within 7 days of hospital discharge or they will no longer be valid for the [facility] program. Should you have any problems with your prescriptions please contact your case management team member at [phone] for [place] or [phone] for [facility].  Thank you, [facility]    [facility] Program Pharmacies [place]. [facility], [facility], [facility]  [facility] [address] [place], [place] [address] [place], [place] [address], [place], [employer] [address], [address], [place] Other [facility] [address] [place], [place] [facility] [address]  [facility] [id number], [place]

## 2023-09-09 NOTE — Discharge Summary (Signed)
 Physician Discharge Summary  [name] ZOX:[id number] DOB: 24-Jul-1985 DOA: 09/07/2023  PCP: Pcp, No  Admit date: 09/07/2023 Discharge date: 09/09/2023  Admitted From: (Home) Disposition:  (Home )  Recommendations for Outpatient Follow-up:  Follow up with PCP in 1-2 weeks Please obtain BMP/CBC in one week Please continue counseling about medication medication compliance [facility] will call patient for a follow-up regarding nephrotic syndrome  discharge plan was discussed with patient's sister by phone [name] [phone]  Diet recommendation: Heart Healthy / Carb Modified  Brief/Interim Summary:  The patient [name] is a 38 y.o. male with medical history significant for type 1 diabetes mellitus and hypothyroidism who presents with nausea, vomiting, polydipsia, and lethargy in the setting of nonadherence with his insulin  for the past week.   Patient reports that he has not used insulin  or checked his sugars in close to a week now.  He has offered various explanations of this but states that he has insulin  and all of the supplies that he needs available at home.   Patient admitted for DKA and placed on insulin  drip but now weaned off and transition long-acting.  Was significant for AKI as well.  DKA in the setting of Uncontrolled Diabetes Mellitus Type 1: - Patient following with endocrinology as an outpatient, he is with noncompliance, mainly in the setting of poor cognition, but patient lives at home with his sister, and is having friends with him all the time who usually assist with him getting his medication, he is admitted the DKA protocol, A1c significantly elevated at 15.3, he was seen by both diabetic coordinator and nutritionist educated both about compliance and diet. - He will be resumed on his home dose of NovoLog  75/25, I have discussed with both friend and sister by phone who will ensure him taking his meds. - Patient provided  by [facility] sensor and receiver.   Psuedohyponatremia: In the setting of Hyperglycemia: Resolved.  HLD: Resume Atorvastatin  10 mg po daily   Hypothyroidism: TSH mildly elevated, but likely sick euthyroid syndrome, continue with current dose Synthroid , recheck level in 4 to 6 weeks.  Hypoalbuminemia, proteinuria, albuminuria, concern for nephrotic syndrome -UA significant for proteinuria, low albumin , anasarca, concern for nephrotic syndrome secondary to diabetes, discussed with renal on-call Dr. Alona Arrow who will assist to arrange routine outpatient follow-up for nephrotic syndrome  Essential HTN: Resume ACE-I with Lisinopril  10 mg po daily.   LE Swelling: Likely due to hypoalbuminemia, Dopplers negative for DVT   hypophosphatemia: Replaced   Normocytic Anemia: B12, iron and ferritin within normal limit, this is most likely due to CKD   Discharge Diagnoses:  Principal Problem:   DKA (diabetic ketoacidosis) (HCC) Active Problems:   AKI (acute kidney injury) [facility])    Discharge Instructions  Discharge Instructions     Diet - low sodium heart healthy   Complete by: As directed    Diet - low sodium heart healthy   Complete by: As directed    Discharge instructions   Complete by: As directed    Follow with Primary MD/endocrinologist in 7 days  Get CBC, CMP, checked  by Primary MD next visit.    Activity: As tolerated with Full fall precautions use walker/cane & assistance as needed   Disposition Home    Diet: Carb modified   On your next visit with your primary care physician please Get Medicines reviewed and adjusted.   Please request your Prim.MD to go over all Hospital Tests and Procedure/Radiological  results at the follow up, please get all Hospital records sent to your Prim MD by signing hospital release before you go home.   If you experience worsening of your admission symptoms, develop shortness of breath, life threatening emergency, suicidal or homicidal  thoughts you must seek medical attention immediately by calling 911 or calling your MD immediately  if symptoms less severe.  You Must read complete instructions/literature along with all the possible adverse reactions/side effects for all the Medicines you take and that have been prescribed to you. Take any new Medicines after you have completely understood and accpet all the possible adverse reactions/side effects.   Do not drive, operating heavy machinery, perform activities at heights, swimming or participation in water activities or provide baby sitting services if your were admitted for syncope or siezures until you have seen by Primary MD or a Neurologist and advised to do so again.  Do not drive when taking Pain medications.    Do not take more than prescribed Pain, Sleep and Anxiety Medications  Special Instructions: If you have smoked or chewed Tobacco  in the last 2 yrs please stop smoking, stop any regular Alcohol  and or any Recreational drug use.  Wear Seat belts while driving.   Please note  You were cared for by a hospitalist during your hospital stay. If you have any questions about your discharge medications or the care you received while you were in the hospital after you are discharged, you can call the unit and asked to speak with the hospitalist on call if the hospitalist that took care of you is not available. Once you are discharged, your primary care physician will handle any further medical issues. Please note that NO REFILLS for any discharge medications will be authorized once you are discharged, as it is imperative that you return to your primary care physician (or establish a relationship with a primary care physician if you do not have one) for your aftercare needs so that they can reassess your need for medications and monitor your lab values.   Discharge instructions   Complete by: As directed    Follow with Primary MD /endocrinologist  Get CBC, CMP checked  by  Primary MD next visit.    Activity: As tolerated with Full fall precautions use walker/cane & assistance as needed   Disposition Home    Diet: carb modified   On your next visit with your primary care physician please Get Medicines reviewed and adjusted.   Please request your Prim.MD to go over all Hospital Tests and Procedure/Radiological results at the follow up, please get all Hospital records sent to your Prim MD by signing hospital release before you go home.   If you experience worsening of your admission symptoms, develop shortness of breath, life threatening emergency, suicidal or homicidal thoughts you must seek medical attention immediately by calling 911 or calling your MD immediately  if symptoms less severe.  You Must read complete instructions/literature along with all the possible adverse reactions/side effects for all the Medicines you take and that have been prescribed to you. Take any new Medicines after you have completely understood and accpet all the possible adverse reactions/side effects.   Do not drive, operating heavy machinery, perform activities at heights, swimming or participation in water activities or provide baby sitting services if your were admitted for syncope or siezures until you have seen by Primary MD or a Neurologist and advised to do so again.  Do not drive when taking  Pain medications.    Do not take more than prescribed Pain, Sleep and Anxiety Medications  Special Instructions: If you have smoked or chewed Tobacco  in the last 2 yrs please stop smoking, stop any regular Alcohol  and or any Recreational drug use.  Wear Seat belts while driving.   Please note  You were cared for by a hospitalist during your hospital stay. If you have any questions about your discharge medications or the care you received while you were in the hospital after you are discharged, you can call the unit and asked to speak with the hospitalist on call if the  hospitalist that took care of you is not available. Once you are discharged, your primary care physician will handle any further medical issues. Please note that NO REFILLS for any discharge medications will be authorized once you are discharged, as it is imperative that you return to your primary care physician (or establish a relationship with a primary care physician if you do not have one) for your aftercare needs so that they can reassess your need for medications and monitor your lab values.   Increase activity slowly   Complete by: As directed    Increase activity slowly   Complete by: As directed       Allergies as of 09/09/2023   No Known Allergies      Medication List     TAKE these medications    aspirin  EC 81 MG tablet Take 1 tablet (81 mg total) by mouth daily.   atorvastatin  10 MG tablet Commonly known as: LIPITOR TAKE 1 TABLET BY MOUTH EVERY DAY   BD Pen Needle Micro U/F 32G X 6 MM Misc Generic drug: Insulin  Pen Needle USE TO INJECT INSULIN  6 TIMES DAILY. AS NEEDED   Dexcom G7 Receiver Devi Please use per instructions   Dexcom G7 Sensor Misc Please use per instruction   Insulin  Lispro Prot & Lispro (75-25) 100 UNIT/ML Kwikpen Commonly known as: HumaLOG  Mix 75/25 KwikPen INJECT 28 UNITS INTO THE SKIN IN THE MORNING AND 24 UNITS IN THE EVENING   lisinopril  10 MG tablet Commonly known as: ZESTRIL  TAKE 1 TABLET BY MOUTH EVERY DAY   OneTouch Verio test strip Generic drug: glucose blood CHECK BLOOD SUGAR 8 TIMES DAILY   Synthroid  25 MCG tablet Generic drug: levothyroxine  TAKE 1 TABLET BY MOUTH EVERY DAY BEFORE BREAKFAST        No Known Allergies  Consultations: none   Procedures/Studies: VAS US  LOWER EXTREMITY VENOUS (DVT) Result Date: 09/09/2023  Lower Venous DVT Study Patient Name:  [name]  Date of Exam:   09/08/2023 Medical Rec #: [id number]              Accession #:    [id number] Date of Birth: [birth year]              Patient  Gender: M Patient Age:   38 years Exam Location:  [facility] Procedure:      VAS US  LOWER EXTREMITY VENOUS (DVT) Referring Phys: Aura Leeds --------------------------------------------------------------------------------  Indications: Edema.  Comparison Study: No previous exams Performing Technologist: Jody Hill RVT, RDMS  Examination Guidelines: A complete evaluation includes B-mode imaging, spectral Doppler, color Doppler, and power Doppler as needed of all accessible portions of each vessel. Bilateral testing is considered an integral part of a complete examination. Limited examinations for reoccurring indications may be performed as noted. The reflux portion of the exam is performed with the patient in reverse Trendelenburg.  +---------+---------------+---------+-----------+----------+--------------+  RIGHT    CompressibilityPhasicitySpontaneityPropertiesThrombus Aging +---------+---------------+---------+-----------+----------+--------------+ CFV      Full           Yes      Yes                                 +---------+---------------+---------+-----------+----------+--------------+ SFJ      Full                                                        +---------+---------------+---------+-----------+----------+--------------+ FV Prox  Full           Yes      Yes                                 +---------+---------------+---------+-----------+----------+--------------+ FV Mid   Full           Yes      Yes                                 +---------+---------------+---------+-----------+----------+--------------+ FV DistalFull           Yes      Yes                                 +---------+---------------+---------+-----------+----------+--------------+ PFV      Full                                                        +---------+---------------+---------+-----------+----------+--------------+ POP      Full           Yes      Yes                                  +---------+---------------+---------+-----------+----------+--------------+ PTV      Full                                                        +---------+---------------+---------+-----------+----------+--------------+ PERO     Full                                                        +---------+---------------+---------+-----------+----------+--------------+   +---------+---------------+---------+-----------+----------+--------------+ LEFT     CompressibilityPhasicitySpontaneityPropertiesThrombus Aging +---------+---------------+---------+-----------+----------+--------------+ CFV      Full           Yes      Yes                                 +---------+---------------+---------+-----------+----------+--------------+  SFJ      Full                                                        +---------+---------------+---------+-----------+----------+--------------+ FV Prox  Full           Yes      Yes                                 +---------+---------------+---------+-----------+----------+--------------+ FV Mid   Full           Yes      Yes                                 +---------+---------------+---------+-----------+----------+--------------+ FV DistalFull           Yes      Yes                                 +---------+---------------+---------+-----------+----------+--------------+ PFV      Full                                                        +---------+---------------+---------+-----------+----------+--------------+ POP      Full           Yes      Yes                                 +---------+---------------+---------+-----------+----------+--------------+ PTV      Full                                                        +---------+---------------+---------+-----------+----------+--------------+ PERO     Full                                                         +---------+---------------+---------+-----------+----------+--------------+     Summary: BILATERAL: - No evidence of deep vein thrombosis seen in the lower extremities, bilaterally. -No evidence of popliteal cyst, bilaterally.   *See table(s) above for measurements and observations. Electronically signed by Delaney Fearing on 09/09/2023 at 9:07:12 AM.    Final       Subjective:  No nausea, no vomiting, no significant events, he denies any complaints today, Discharge Exam: Vitals:   09/08/23 2338 09/09/23 0242  BP: 117/85 (!) 121/93  Pulse: 89 73  Resp: (!) 23 13  Temp: 98.8 F (37.1 C) (!) 97.5 F (36.4 C)  SpO2: 98% 99%   Vitals:   09/08/23 1557 09/08/23 2025 09/08/23 2338 09/09/23 0242  BP: (!) 147/106 117/84 117/85 (!) 121/93  Pulse: Aaron Aas)  105 (!) 101 89 73  Resp: 16 17 (!) 23 13  Temp: 97.7 F (36.5 C) 99 F (37.2 C) 98.8 F (37.1 C) (!) 97.5 F (36.4 C)  TempSrc: Oral Oral Oral Oral  SpO2: 100% 94% 98% 99%  Weight:      Height:        General: Pt is alert, awake, not in acute distress, he is legally blind Cardiovascular: RRR, S1/S2 +, no rubs, no gallops Respiratory: CTA bilaterally, no wheezing, no rhonchi Abdominal: Soft, NT, ND, bowel sounds + Extremities: +1 edema, no cyanosis    The results of significant diagnostics from this hospitalization (including imaging, microbiology, ancillary and laboratory) are listed below for reference.     Microbiology: No results found for this or any previous visit (from the past 240 hours).   Labs: BNP (last 3 results) No results for input(s): "BNP" in the last 8760 hours. Basic Metabolic Panel: Recent Labs  Lab 09/08/23 0328 09/08/23 0735 09/08/23 1030 09/08/23 1437 09/09/23 0445  NA 134* 135 136 136 136  K 3.9 3.6 3.7 4.3 3.6  CL 99 102 102 101 103  CO2 24 22 27 26 26   GLUCOSE 370* 661* 180* 255* 210*  BUN 35* 29* 32* 27* 27*  CREATININE 1.94* 1.59* 1.79* 1.61* 1.57*  CALCIUM  8.7* 8.1* 8.4* 8.5* 8.2*  MG  --    --  2.0  --  1.7  PHOS  --   --  2.3*  --  3.0   Liver Function Tests: Recent Labs  Lab 09/07/23 1745 09/08/23 1030 09/09/23 0445  AST 16 13* 12*  ALT 18 11 12   ALKPHOS 113 58 57  BILITOT 0.9 0.6 0.6  PROT 5.5* 4.2* 4.2*  ALBUMIN 2.2* 1.7* <1.5*   No results for input(s): "LIPASE", "AMYLASE" in the last 168 hours. No results for input(s): "AMMONIA" in the last 168 hours. CBC: Recent Labs  Lab 09/07/23 1452 09/07/23 1456 09/07/23 1845 09/08/23 0852 09/09/23 0445  WBC  --   --  6.8 6.6 6.3  NEUTROABS  --   --   --  4.3 4.0  HGB 14.6 14.3 11.8* 11.1* 11.0*  HCT 43.0 42.0 34.2* 32.2* 31.6*  MCV  --   --  91.7 91.2 91.3  PLT  --   --  286 234 260   Cardiac Enzymes: No results for input(s): "CKTOTAL", "CKMB", "CKMBINDEX", "TROPONINI" in the last 168 hours. BNP: Invalid input(s): "POCBNP" CBG: Recent Labs  Lab 09/08/23 1356 09/08/23 1524 09/08/23 2158 09/09/23 0906 09/09/23 1215  GLUCAP 216* 185* 281* 403* 250*   D-Dimer No results for input(s): "DDIMER" in the last 72 hours. Hgb A1c Recent Labs    09/07/23 1914  HGBA1C 15.3*   Lipid Profile No results for input(s): "CHOL", "HDL", "LDLCALC", "TRIG", "CHOLHDL", "LDLDIRECT" in the last 72 hours. Thyroid  function studies Recent Labs    09/09/23 0445  TSH 5.288*   Anemia work up Recent Labs    09/09/23 0445  VITAMINB12 697  FOLATE 7.9  FERRITIN 207  TIBC 168*  IRON 76  RETICCTPCT 1.6   Urinalysis    Component Value Date/Time   COLORURINE STRAW (A) 09/07/2023 1417   APPEARANCEUR CLEAR 09/07/2023 1417   LABSPEC 1.023 09/07/2023 1417   PHURINE 6.0 09/07/2023 1417   GLUCOSEU >=500 (A) 09/07/2023 1417   HGBUR SMALL (A) 09/07/2023 1417   BILIRUBINUR NEGATIVE 09/07/2023 1417   BILIRUBINUR negative 09/07/2023 1350   KETONESUR 20 (A) 09/07/2023 1417   KETONESUR moderate (  40) (A) 09/07/2023 1350   PROTEINUR 100 (A) 09/07/2023 1417   PROTEINUR >=300 (A) 09/07/2023 1350   UROBILINOGEN 0.2 09/07/2023  1350   UROBILINOGEN 1.0 05/12/2014 1159   NITRITE NEGATIVE 09/07/2023 1417   NITRITE Negative 09/07/2023 1350   LEUKOCYTESUR NEGATIVE 09/07/2023 1417   LEUKOCYTESUR Negative 09/07/2023 1350   Sepsis Labs Recent Labs  Lab 09/07/23 1845 09/08/23 0852 09/09/23 0445  WBC 6.8 6.6 6.3   Microbiology No results found for this or any previous visit (from the past 240 hours).   Time coordinating discharge: Over 30 minutes  SIGNED:   Seena Dadds, MD  [facility] 09/09/2023, 1:26 PM Pager   If 7PM-7AM, please contact night-coverage [email or web address] Password [email or web address]

## 2023-09-09 NOTE — Inpatient Diabetes Management (Signed)
 Inpatient Diabetes Program Recommendations  AACE/ADA: New Consensus Statement on Inpatient Glycemic Control (2015)  Target Ranges:  Prepandial:   less than 140 mg/dL      Peak postprandial:   less than 180 mg/dL (1-2 hours)      Critically ill patien

## 2023-09-09 NOTE — Progress Notes (Signed)
 Initial Nutrition Assessment  DOCUMENTATION CODES:  Not applicable  INTERVENTION:  MVI w/ minerals Diabetes diet education w/ handouts Recommend follow up w/ endocrinologist and outpatient RD   NUTRITION DIAGNOSIS:  Altered nutrition lab value related to limited prior education, other (see comment) (loss of CGM and non-adherence with insulin  regimen at home) as evidenced by per patient/family report.  GOAL:  Patient will meet greater than or equal to 90% of their needs  MONITOR:  PO intake, Labs  REASON FOR ASSESSMENT:   Consult Assessment of nutrition requirement/status, Diet education  ASSESSMENT:   Pt with PMH: T1DM, hypothyroidism who presented to [facility] with c/o nausea, vomiting, polydipsia, and lethargy d/t nonadherence w/ insulin  x1 week.  24 Hour Recall B: skips L: pimento cheese sandwich, diet Coke D: macaroni and cheese, mashed potatoes, salisbury steak  Reports improvement in appetite/intake today. Verbalizes returning of hunger and satiety cues s/p multiple days of N/V. No intake documentation to review. He endorses no difficulties with chewing or swallowing. States he has been unable to monitor blood sugars x1 week d/t obtaining new phone that is not compatible with his CGM.   Admit/Current Weight: 59.9kg - ?accuracy, weight appears pulled forward from admission >1 year ago, recommend new weight collection  He endorses UBW of around 120lbs. Unsure of current weight as admission weight appears pulled forward from admission over one year ago. Unable to accurately assess recent weight trend.  Insulin  adjusted during admission. Provided diet education today at bedside to patient and his roommate.  Provided Heart Healthy, Consistent Carbohydrate Nutrition Therapy handout from the Academy of [employer]etics. Reviewed patient's dietary recall. Provided examples on ways to increase protein and fiber intake. Discouraged intake of processed foods and sugary  sweetened cookies, candies, and sodas. Encouraged fresh fruits and vegetables as well as whole grain sources of carbohydrates.  RD discussed why it is important for patient to adhere to diet recommendations, and emphasized the role of fluids, foods to avoid, and importance of weighing self daily.   Discussed different food groups and their effects on blood sugar, emphasizing carbohydrate-containing foods. Provided list of carbohydrates and recommended serving sizes of common foods.  Discussed importance of controlled and consistent carbohydrate intake throughout the day. Provided examples of ways to balance meals/snacks and encouraged intake of high-fiber, whole grain complex carbohydrates.Also recommending follow up outpatient with both endocrinologist and dietitian for diet education and diabetes management.  Albumin unreadable. Likely r/t hemoconcentration on admission and now dilution w/ rehydration. Has required phosphorus repletion.   Meds: SSI 0-5 QHS, SSI 0-9 TID, SSI 3 TID, Novolog  Mix 70/30 - 24 BID, levothyroxine   Labs: Na+ 136 (wdl) K+ 3.6 (wdl) PHOS 2.3>3.0 (wdl) Mg 1.7 (wdl) Albumin <8.4 (L) CBGs 210-255 x24 hours A1c 15.3 (08/2023)   NUTRITION - FOCUSED PHYSICAL EXAM: Deferred d/t patient needing to use the bathroom at end of session  Diet Order:   Diet Order             Diet Carb Modified Fluid consistency: Thin; Room service appropriate? Yes  Diet effective now            EDUCATION NEEDS:   Education needs have been addressed  Skin:  Skin Assessment: Reviewed RN Assessment  Last BM:  4/18  Height:  Ht Readings from Last 1 Encounters:  09/07/23 5' 2 (1.575 m)   Weight:  Wt Readings from Last 1 Encounters:  09/07/23 59.9 kg   Ideal Body Weight:  53.6 kg  BMI:  Body  mass index is 24.15 kg/m.  Estimated Nutritional Needs:   Kcal:  1900-2100kcal  Protein:  90-105g  Fluid:  >1.9L/day  Blair Deaner MS, RD, LDN Registered Dietitian Clinical  Nutrition RD Inpatient Contact Info in Amion

## 2023-09-09 NOTE — TOC Transition Note (Signed)
 Transition of Care [facility]) - Discharge Note   Patient Details  Name: [name] MRN: [id number] Date of Birth: [birth year]  [facility] of [facility] CM/SW Contact:  Eusebio High, RN Phone Number: 09/09/2023, 1:43 PM   Clinical Narrative\  Patient will DC to home today. Referral has been made to [facility] OP DM / nutrition clinic. AVS updated  Family will transport  MATCH completed for this patient  No additional TOC needs            Patient Goals and CMS Choice            Discharge Placement                       Discharge Plan and Services Additional resources added to the After Visit Summary for                                       Social Drivers of Health (SDOH) Interventions SDOH Screenings   Tobacco Use: Low Risk  (09/07/2023)     Readmission Risk Interventions     No data to display

## 2023-09-12 LAB — HEMOGLOBIN A1C
Hgb A1c MFr Bld: 15.4 % — ABNORMAL HIGH (ref 4.8–5.6)
Mean Plasma Glucose: 395 mg/dL

## 2023-10-11 NOTE — Progress Notes (Addendum)
 HPI: [name] is seen today for f/u of diabetes.  He lives with his sister and she accompanies him today.  He is a 38 y.o. male who was diagnosed with diabetes at the age of 39 in 2 or 2002.  He was on a pump in the past but could not manage it due to cognitive issues.  He has tried the [place] in the past but it kept coming off.  I last saw him in 11/24.  He no-showed in 2/25.  He was admitted in 4/25 due to DKA, as he had not used insulin  (unsure why) and was using his dexcom that week.   He is currently on Humalog  75/25, 45-50 units daily (20-30 units with his first meal based on sugar and 25 units with supper).   He uses the Dexcom for CGM and is on it currently.  It was downloaded and reviewed.  His 14-day avg was 212 with a SD of 92.  His TIR was 43%.   Review of his diet shows that he avoids concentrated carbs.  He denies numbness/tingling/burning in his feet.  He is concerned about his diabetes.      ROS:  No CP. No SOB.  Medical History: Past Medical History:  Diagnosis Date   Diabetes mellitus type I (CMS/HHS-HCC)    Hypertension    Myocardiopathy (CMS/HHS-HCC)    Retinitis pigmentosa 1997    Surgical History: Past Surgical History:  Procedure Laterality Date   CATARACT EXTRACTION      Social History:  reports that he has never smoked. He has never used smokeless tobacco. He reports that he does not use drugs. 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     Family History: family history includes Diabetes type II in his maternal grandmother and paternal grandmother; Heart disease in his mother; Retinopathy of prematurity in an other family member.  Medications: Current Outpatient Medications  Medication Sig Dispense Refill   atorvastatin  (LIPITOR) 10 MG tablet Take 1 tablet (10 mg total) by mouth once daily 90 tablet 4   BD ULTRA-FINE MICRO PEN NEEDLE 32 gauge x 1/4 needle 1 EACH 2 (TWO) TIMES DAILY UP TO 6 SHOTS PER DAY. 200 each 4   blood glucose  diagnostic (ONETOUCH VERIO TEST STRIPS) test strip 1 each (1 strip total) 3 (three) times daily 300 each 4   blood-glucose sensor (DEXCOM G6 SENSOR) Devi USE TO MONITOR BLOOD SUGAR. REPLACE EVERY 10 DAYS 9 each 3   DEXCOM G6 TRANSMITTER Devi Use to monitor blood sugar.  Replace every 3 months 1 each 4   insulin  LISPRO (ADMELOG , HUMALOG ) injection (concentration 100 units/mL) up to 65 units daily in pump 20 mL 5   insulin  LISPRO PROTAMINE-LISPRO (HUMALOG  MIX 75-25 KWIKPEN) 100 unit/mL (75-25) pen injector 34-36 UNITS BEFORE BREAKFAST. 24-26 UNITS BEFORE SUPPER. MAX DAILY DOSE 62 UNITS 45 mL 1   insulin  pmp cart,aut,G6/7,cntr (OMNIPOD 5 G6-G7 INTRO KT,GEN5,) Crtg Inject 1 each subcutaneously every third day (Patient not taking: Reported on 04/13/2023) 1 each 0   insulin  pump cart,auto,BT-cntr (OMNIPOD 5 G6 INTRO KIT, GEN 5,) Crtg INJECT 1 EACH SUBCUTANEOUSLY EVERY THIRD DAY (Patient not taking: Reported on 04/13/2023) 10 each 12   lisinopriL  (ZESTRIL ) 10 MG tablet Take 1 tablet (10 mg total) by mouth once daily 90 tablet 4   SYNTHROID  25 mcg tablet Take 1 tablet (25 mcg total) by mouth every morning before breakfast (0630) 90 tablet 4   No current facility-administered medications for this visit.  Allergies: No Known Allergies  Physical Exam: Vitals:   10/11/23 1112  Height: 165.1 cm (5' 5)     Body mass index is 22.53 kg/m. GENERAL: Pleasant, well-appearing male in no distress.    Physical exam otherwise deferred due to coronavirus precautions.   Labs: 09/26/2020:  A1c > 14.  03/04/2021:  A1c = 12.9.  Chol = 160/83/57/86  06/03/2021:  A1c = 13.8 01/19/2022:  A1c = 9.3.  MA = 3267. Chol = 203/169/58/113.  TSH = 5.04.  FT4 = 1.0.  FT3 = 3.1.   08/23/2022:  K/Cr/Ca = 5.0/1.45/9.0.  LFTs nl.  08/26/2022:  A1c = 13.  K/Cr/Ca= 4.5/1.2/8.7.  Chol=192/180/60.5/96.  LFTs nl except alb=3.1.  TSH=3.588.  01/05/2023:  A1c = 11.6 04/13/2023:  A1c = 13.2  09/09/2023:  A1c= 15.4.  K/Cr/Ca=   3.6/1.57/8.2.  TSH= 5.288.  B12= 697.   Assessment/Plan: 1.  Type 1 Diabetes.  His A1c last month is 15.4.  I will call the OP5 trainer and ask them to call his mother to set up the training [phone]).  I will start him on ~1.1 u/hr for his basal, 1/8 carbs, 1/25 correction.  He doesn't carb count so will have him put in 40 g for breakfast/lunch and 60 with supper.  20 g if he has a snack.  2.  HTN/CKD associated with diabetes.  His BP is ok today on Lisinopril  10 mg daily.  His Cr transiently worsened om 4/25. Consider increasing his lisinopril .  3.  HLD associated with diabetes.  His LDL was 96 in 4/24 on Lipitor 10 mg daily.   4.  Hypothyroidism. His TSH in 4/25 was elevated on LT4 25 mcg daily.  We will plan to re-check next time.   5.  Neuropathy.  His old records say neuropathy.  We did not discuss this today.  I will plan to ask him about this next visit.  6.  CP.  He was admitted in 6/20 with CP and mildly elevated Troponin levels.  Noninvasive evaluation was negative so cardiology is following him.  Will only plan to do a cath if symptoms recur.    7.  Cognitive issues.  This makes treating his diabetes difficult.  I am hopeful that if we just give him a fixed bolus amount with meals, he will be able to handle the omnipod pump and the automation will help him.  8.  Prophylaxis.  I will plan a foot exam next visit.  We last received a copy from the eye doctor (Dr. Octavia in [place] [phone])) on 12/29/2022.  No diabetic retinopathy was seen.    9.  He will return to clinic in 3 months.  01/17/24:  Cx'd 01/18/24 due to work  02/15/24:  Message rec'd from [employer] that no longer covering humalog  75/25.  Will change to novolog  mix 70/30.  This note is partially prepared by [facility], Scribe, in the presence of and acting as the scribe of Dr. Debby Breaker , MD.     [facility], MD

## 2024-03-28 NOTE — Progress Notes (Signed)
 HPI: [name] is seen today for f/u of diabetes.  He is a 38 y.o. male who was diagnosed with diabetes at the age of 17 in 2 or 2002.  He was on a pump in the past but could not manage it due to cognitive issues.  He has tried the [place] in the past but it kept coming off.  I last saw him in 5/25. In 9/25 his insurance no longer covers humalog  so switched to Novolog .  He is accompanied by his mother today. He is legally blind.   He is currently on Novolog  70/30, 45-50 units daily (10-30 depending on if he eats breakfast (usually doesn't so his mom gives him 10 units) and 25 with supper). He was on Humalog  but his insurance no longer covers it as above.   He uses the Dexcom for CGM and is on it currently.  It was downloaded and reviewed.  His 14-day avg was 321 with a SD of 95.  His TIR was 12%.   Review of his diet shows that he avoids concentrated carbs.  He denies numbness/tingling/burning in his feet.  He is concerned about his diabetes.     ROS:  No CP. No SOB.  Medical History: Past Medical History:  Diagnosis Date   Diabetes mellitus type I (CMS/HHS-HCC)    Hypertension    Myocardiopathy (CMS/HHS-HCC)    Retinitis pigmentosa 1997    Surgical History: Past Surgical History:  Procedure Laterality Date   CATARACT EXTRACTION      Social History:  reports that he has never smoked. He has never used smokeless tobacco. He reports that he does not use drugs. 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     Family History: family history includes Diabetes type II in his maternal grandmother and paternal grandmother; Heart disease in his mother; Retinopathy of prematurity in an other family member.  Medications: Current Outpatient Medications  Medication Sig Dispense Refill   atorvastatin  (LIPITOR) 10 MG tablet TAKE 1 TABLET BY MOUTH EVERY DAY 90 tablet 4   BD ULTRA-FINE MICRO PEN NEEDLE 32 gauge x 1/4 needle 1 EACH 2 (TWO) TIMES DAILY UP TO 6 SHOTS PER DAY. 200 each 4    blood glucose diagnostic (ONETOUCH VERIO TEST STRIPS) test strip 1 each (1 strip total) 3 (three) times daily 300 each 4   blood-glucose sensor (DEXCOM G7 SENSOR) Devi USE 1 EACH EVERY 10 (TEN) DAYS 9 each 3   insulin  ASPART PROTAMINE-ASPART (NOVOLOG  MIX FLEXPEN 70/30) 100 unit/mL (70-30) pen injector 20-30 units with breakfast, 25 with supper.  Max daily dose 55 units 15 mL 12   lisinopriL  (ZESTRIL ) 10 MG tablet TAKE 1 TABLET BY MOUTH EVERY DAY 90 tablet 4   SYNTHROID  25 mcg tablet TAKE 1 TABLET (25 MCG TOTAL) BY MOUTH EVERY MORNING BEFORE BREAKFAST (0630) 90 tablet 4   blood-glucose sensor (DEXCOM G6 SENSOR) Devi USE TO MONITOR BLOOD SUGAR. REPLACE EVERY 10 DAYS (Patient not taking: Reported on 03/28/2024) 9 each 3   DEXCOM G6 TRANSMITTER Devi Use to monitor blood sugar.  Replace every 3 months (Patient not taking: Reported on 03/28/2024) 1 each 4   insulin  LISPRO (ADMELOG , HUMALOG ) injection (concentration 100 units/mL) up to 65 units daily in pump (Patient not taking: Reported on 03/28/2024) 20 mL 5   insulin  pmp cart,aut,G6/7,cntr (OMNIPOD 5 G6-G7 INTRO KT,GEN5,) Crtg Inject 1 each subcutaneously every third day (Patient not taking: Reported on 04/13/2023) 1 each 0   insulin  pump cart,auto,BT-cntr (OMNIPOD 5 G6  INTRO KIT, GEN 5,) Crtg INJECT 1 EACH SUBCUTANEOUSLY EVERY THIRD DAY (Patient not taking: Reported on 04/13/2023) 10 each 12   No current facility-administered medications for this visit.    Allergies: No Known Allergies  Physical Exam: Vitals:   03/28/24 1400  BP: 130/82  Pulse: 92  SpO2: 99%  Weight: 62.6 kg (138 lb)  Height: 165.1 cm (5' 5)     Body mass index is 22.96 kg/m. GENERAL: Pleasant, well-appearing male in no distress.    Physical exam otherwise deferred due to coronavirus precautions.   Labs: 09/26/2020:  A1c > 14.  03/04/2021:  A1c = 12.9.  Chol = 160/83/57/86  06/03/2021:  A1c = 13.8 01/19/2022:  A1c = 9.3.  MA = 3267. Chol = 203/169/58/113.   TSH = 5.04.  FT4 = 1.0.  FT3 = 3.1.   08/23/2022:  K/Cr/Ca = 5.0/1.45/9.0.  LFTs nl.  08/26/2022:  A1c = 13.  K/Cr/Ca= 4.5/1.2/8.7.  Chol=192/180/60.5/96.  LFTs nl except alb=3.1.  TSH=3.588.  01/05/2023:  A1c = 11.6 04/13/2023:  A1c = 13.2  09/09/2023:  A1c= 15.4.  K/Cr/Ca=  3.6/1.57/8.2.  TSH= 5.288.  B12= 697.  03/28/2024: A1c = 12.7.   Assessment/Plan: 1.  Type 1 Diabetes.  His A1c today is 12.7. Last two visits I have tried to get them in contact with the rep. I will call the OP5 trainer and ask them to call his mother to set up the training [phone]). I gave him instructions on the OP5. I sent an rx for Novolog .   Based on review of his CGM, I encouraged him to get started on the pump. When he starts I gave him pre-set meals.  Snack (15 g), lunch (40 g) and supper (60 g).  I encouraged lifestyle modifications.   2.  HTN/CKD associated with diabetes.  His BP is ok today on Lisinopril  10 mg daily.  His Cr transiently worsened om 4/25. Consider increasing his lisinopril .  3.  HLD associated with diabetes.  His LDL was 96 in 4/24 on Lipitor 10 mg daily.   4.  Hypothyroidism. His TSH in 4/25 was elevated on LT4 25 mcg daily.    5.  Neuropathy.  His old records say neuropathy.  We did not discuss this today.  I will plan to ask him about this next visit.  6.  CP.  He was admitted in 6/20 with CP and mildly elevated Troponin levels.  Noninvasive evaluation was negative so cardiology is following him.  Will only plan to do a cath if symptoms recur.    7.  Cognitive issues.  This makes treating his diabetes difficult.  I am hopeful that if we just give him a fixed bolus amount with meals, he will be able to handle the omnipod pump and the automation will help him.  8.  Prophylaxis.  I will plan a foot exam next visit.  We last received a copy from the eye doctor (Dr. Octavia in [place] [phone])) on 12/29/2022.  No diabetic retinopathy was seen.  He says he has an upcoming appt in Januray.  When he is seen, I told him to have them send me a copy of the report.   9.  He will return to clinic in 3 months.  This note is partially prepared by Earla Daria Messier, Scribe, in the presence of and acting as the scribe of Dr. Debby Breaker , MD.    [facility], MD

## 2024-05-31 ENCOUNTER — Inpatient Hospital Stay (HOSPITAL_COMMUNITY)
Admission: EM | Admit: 2024-05-31 | Discharge: 2024-06-06 | DRG: 280 | Disposition: A | Discharge status: Home / Self Care | Attending: Internal Medicine | Admitting: Internal Medicine

## 2024-05-31 ENCOUNTER — Emergency Department (HOSPITAL_COMMUNITY)

## 2024-05-31 ENCOUNTER — Encounter (HOSPITAL_COMMUNITY): Payer: Self-pay

## 2024-05-31 ENCOUNTER — Ambulatory Visit (HOSPITAL_COMMUNITY)
Admission: EM | Admit: 2024-05-31 | Discharge: 2024-05-31 | Disposition: A | Discharge status: Home / Self Care | Attending: Emergency Medicine | Admitting: Emergency Medicine

## 2024-05-31 ENCOUNTER — Other Ambulatory Visit: Payer: Self-pay

## 2024-05-31 DIAGNOSIS — I21A1 Myocardial infarction type 2: Secondary | ICD-10-CM | POA: Diagnosis present

## 2024-05-31 DIAGNOSIS — E063 Autoimmune thyroiditis: Secondary | ICD-10-CM | POA: Diagnosis present

## 2024-05-31 DIAGNOSIS — K859 Acute pancreatitis without necrosis or infection, unspecified: Secondary | ICD-10-CM | POA: Diagnosis present

## 2024-05-31 DIAGNOSIS — E8809 Other disorders of plasma-protein metabolism, not elsewhere classified: Secondary | ICD-10-CM | POA: Diagnosis present

## 2024-05-31 DIAGNOSIS — M7989 Other specified soft tissue disorders: Secondary | ICD-10-CM | POA: Diagnosis present

## 2024-05-31 DIAGNOSIS — I249 Acute ischemic heart disease, unspecified: Secondary | ICD-10-CM | POA: Diagnosis present

## 2024-05-31 DIAGNOSIS — Z7982 Long term (current) use of aspirin: Secondary | ICD-10-CM | POA: Diagnosis not present

## 2024-05-31 DIAGNOSIS — N1330 Unspecified hydronephrosis: Secondary | ICD-10-CM | POA: Diagnosis present

## 2024-05-31 DIAGNOSIS — I161 Hypertensive emergency: Secondary | ICD-10-CM | POA: Diagnosis present

## 2024-05-31 DIAGNOSIS — H548 Legal blindness, as defined in USA: Secondary | ICD-10-CM | POA: Diagnosis present

## 2024-05-31 DIAGNOSIS — Z833 Family history of diabetes mellitus: Secondary | ICD-10-CM

## 2024-05-31 DIAGNOSIS — I509 Heart failure, unspecified: Secondary | ICD-10-CM

## 2024-05-31 DIAGNOSIS — Z794 Long term (current) use of insulin: Secondary | ICD-10-CM

## 2024-05-31 DIAGNOSIS — F71 Moderate intellectual disabilities: Secondary | ICD-10-CM | POA: Diagnosis present

## 2024-05-31 DIAGNOSIS — E1022 Type 1 diabetes mellitus with diabetic chronic kidney disease: Secondary | ICD-10-CM | POA: Diagnosis present

## 2024-05-31 DIAGNOSIS — I10 Essential (primary) hypertension: Secondary | ICD-10-CM | POA: Diagnosis not present

## 2024-05-31 DIAGNOSIS — Z23 Encounter for immunization: Secondary | ICD-10-CM | POA: Diagnosis present

## 2024-05-31 DIAGNOSIS — N1411 Contrast-induced nephropathy: Secondary | ICD-10-CM | POA: Diagnosis not present

## 2024-05-31 DIAGNOSIS — Z79899 Other long term (current) drug therapy: Secondary | ICD-10-CM

## 2024-05-31 DIAGNOSIS — Z91199 Patient's noncompliance with other medical treatment and regimen due to unspecified reason: Secondary | ICD-10-CM

## 2024-05-31 DIAGNOSIS — R7989 Other specified abnormal findings of blood chemistry: Principal | ICD-10-CM | POA: Diagnosis present

## 2024-05-31 DIAGNOSIS — H543 Unqualified visual loss, both eyes: Secondary | ICD-10-CM | POA: Diagnosis present

## 2024-05-31 DIAGNOSIS — E10319 Type 1 diabetes mellitus with unspecified diabetic retinopathy without macular edema: Secondary | ICD-10-CM | POA: Diagnosis present

## 2024-05-31 DIAGNOSIS — N179 Acute kidney failure, unspecified: Secondary | ICD-10-CM | POA: Diagnosis present

## 2024-05-31 DIAGNOSIS — I130 Hypertensive heart and chronic kidney disease with heart failure and stage 1 through stage 4 chronic kidney disease, or unspecified chronic kidney disease: Secondary | ICD-10-CM | POA: Diagnosis present

## 2024-05-31 DIAGNOSIS — N1832 Chronic kidney disease, stage 3b: Secondary | ICD-10-CM | POA: Diagnosis present

## 2024-05-31 DIAGNOSIS — E1065 Type 1 diabetes mellitus with hyperglycemia: Secondary | ICD-10-CM | POA: Diagnosis present

## 2024-05-31 DIAGNOSIS — I169 Hypertensive crisis, unspecified: Secondary | ICD-10-CM | POA: Diagnosis not present

## 2024-05-31 DIAGNOSIS — E785 Hyperlipidemia, unspecified: Secondary | ICD-10-CM | POA: Diagnosis present

## 2024-05-31 DIAGNOSIS — Z91148 Patient's other noncompliance with medication regimen for other reason: Secondary | ICD-10-CM

## 2024-05-31 DIAGNOSIS — I5031 Acute diastolic (congestive) heart failure: Secondary | ICD-10-CM | POA: Diagnosis present

## 2024-05-31 DIAGNOSIS — R739 Hyperglycemia, unspecified: Secondary | ICD-10-CM

## 2024-05-31 DIAGNOSIS — R0609 Other forms of dyspnea: Secondary | ICD-10-CM | POA: Diagnosis present

## 2024-05-31 DIAGNOSIS — R600 Localized edema: Secondary | ICD-10-CM

## 2024-05-31 DIAGNOSIS — F79 Unspecified intellectual disabilities: Secondary | ICD-10-CM

## 2024-05-31 LAB — URINALYSIS, ROUTINE W REFLEX MICROSCOPIC
Bacteria, UA: NONE SEEN
Bilirubin Urine: NEGATIVE
Glucose, UA: >=500 mg/dL — AB
Ketones, ur: NEGATIVE mg/dL
Leukocytes,Ua: NEGATIVE
Nitrite: NEGATIVE
Protein, ur: >=300 mg/dL — AB
Specific Gravity, Urine: 1.017 (ref 1.005–1.030)
pH: 6 (ref 5.0–8.0)

## 2024-05-31 LAB — CBC WITH DIFFERENTIAL/PLATELET
Abs Immature Granulocytes: 0.01 K/uL (ref 0.00–0.07)
Basophils Absolute: 0 K/uL (ref 0.0–0.1)
Basophils Relative: 1 %
Eosinophils Absolute: 0.1 K/uL (ref 0.0–0.5)
Eosinophils Relative: 2 %
HCT: 38 % — ABNORMAL LOW (ref 39.0–52.0)
Hemoglobin: 12.8 g/dL — ABNORMAL LOW (ref 13.0–17.0)
Immature Granulocytes: 0 %
Lymphocytes Relative: 27 %
Lymphs Abs: 1.3 K/uL (ref 0.7–4.0)
MCH: 31 pg (ref 26.0–34.0)
MCHC: 33.7 g/dL (ref 30.0–36.0)
MCV: 92 fL (ref 80.0–100.0)
Monocytes Absolute: 0.4 K/uL (ref 0.1–1.0)
Monocytes Relative: 9 %
Neutro Abs: 3 K/uL (ref 1.7–7.7)
Neutrophils Relative %: 61 %
Platelets: 290 K/uL (ref 150–400)
RBC: 4.13 MIL/uL — ABNORMAL LOW (ref 4.22–5.81)
RDW: 12.4 % (ref 11.5–15.5)
WBC: 4.9 K/uL (ref 4.0–10.5)
nRBC: 0 % (ref 0.0–0.2)

## 2024-05-31 LAB — POCT URINE DIPSTICK
Bilirubin, UA: NEGATIVE
Glucose, UA: >=1000 mg/dL — AB
Leukocytes, UA: NEGATIVE
Nitrite, UA: NEGATIVE
POC PROTEIN,UA: >=300 — AB
Spec Grav, UA: 1.015
Urobilinogen, UA: 0.2 U/dL
pH, UA: 6.5

## 2024-05-31 LAB — I-STAT VENOUS BLOOD GAS, ED
Acid-base deficit: 2 mmol/L (ref 0.0–2.0)
Bicarbonate: 25.4 mmol/L (ref 20.0–28.0)
Calcium, Ion: 1.25 mmol/L (ref 1.15–1.40)
HCT: 37 % — ABNORMAL LOW (ref 39.0–52.0)
Hemoglobin: 12.6 g/dL — ABNORMAL LOW (ref 13.0–17.0)
O2 Saturation: 33 %
Potassium: 4.8 mmol/L (ref 3.5–5.1)
Sodium: 133 mmol/L — ABNORMAL LOW (ref 135–145)
TCO2: 27 mmol/L (ref 22–32)
pCO2, Ven: 52.3 mmHg (ref 44–60)
pH, Ven: 7.295 (ref 7.25–7.43)
pO2, Ven: 23 mmHg — CL (ref 32–45)

## 2024-05-31 LAB — TROPONIN T, HIGH SENSITIVITY: Troponin T High Sensitivity: 114 ng/L (ref 0–19)

## 2024-05-31 LAB — COMPREHENSIVE METABOLIC PANEL WITH GFR
ALT: 25 U/L (ref 0–44)
AST: 25 U/L (ref 15–41)
Albumin: 2.8 g/dL — ABNORMAL LOW (ref 3.5–5.0)
Alkaline Phosphatase: 119 U/L (ref 38–126)
Anion gap: 8 (ref 5–15)
BUN: 39 mg/dL — ABNORMAL HIGH (ref 6–20)
CO2: 25 mmol/L (ref 22–32)
Calcium: 9.1 mg/dL (ref 8.9–10.3)
Chloride: 102 mmol/L (ref 98–111)
Creatinine, Ser: 2.07 mg/dL — ABNORMAL HIGH (ref 0.61–1.24)
GFR, Estimated: 42 mL/min — ABNORMAL LOW
Glucose, Bld: 296 mg/dL — ABNORMAL HIGH (ref 70–99)
Potassium: 5.1 mmol/L (ref 3.5–5.1)
Sodium: 135 mmol/L (ref 135–145)
Total Bilirubin: 0.2 mg/dL (ref 0.0–1.2)
Total Protein: 5.7 g/dL — ABNORMAL LOW (ref 6.5–8.1)

## 2024-05-31 LAB — PRO BRAIN NATRIURETIC PEPTIDE: Pro Brain Natriuretic Peptide: 611 pg/mL — ABNORMAL HIGH

## 2024-05-31 LAB — GLUCOSE, POCT (MANUAL RESULT ENTRY): POC Glucose: 434 mg/dL — AB (ref 70–99)

## 2024-05-31 LAB — CBG MONITORING, ED: Glucose-Capillary: 359 mg/dL — ABNORMAL HIGH (ref 70–99)

## 2024-05-31 LAB — BETA-HYDROXYBUTYRIC ACID: Beta-Hydroxybutyric Acid: 0.06 mmol/L (ref 0.05–0.27)

## 2024-05-31 MED ORDER — NITROGLYCERIN 0.4 MG SL SUBL
0.4000 mg | SUBLINGUAL_TABLET | SUBLINGUAL | Status: DC | PRN
  Filled 2024-05-31: qty 1

## 2024-05-31 MED ORDER — ACETAMINOPHEN 325 MG PO TABS: 650.0000 mg | ORAL_TABLET | ORAL | Status: DC | PRN

## 2024-05-31 MED ORDER — SODIUM CHLORIDE 0.9 % IV SOLN: 250.0000 mL | INTRAVENOUS | Status: AC | PRN

## 2024-05-31 MED ORDER — IOHEXOL 350 MG/ML SOLN
75.0000 mL | Freq: Once | INTRAVENOUS | Status: AC | PRN
  Administered 2024-05-31: 75 mL via INTRAVENOUS

## 2024-05-31 MED ORDER — ONDANSETRON HCL 4 MG/2ML IJ SOLN: 4.0000 mg | Freq: Four times a day (QID) | INTRAMUSCULAR | Status: DC | PRN

## 2024-05-31 MED ORDER — SODIUM CHLORIDE 0.9% FLUSH: 3.0000 mL | INTRAVENOUS | Status: DC | PRN

## 2024-05-31 MED ORDER — FUROSEMIDE 10 MG/ML IJ SOLN
40.0000 mg | Freq: Two times a day (BID) | INTRAMUSCULAR | Status: DC
  Administered 2024-06-01 (×2): 40 mg via INTRAVENOUS
  Filled 2024-05-31 (×2): qty 4

## 2024-05-31 MED ORDER — SODIUM CHLORIDE 0.9% FLUSH
3.0000 mL | Freq: Two times a day (BID) | INTRAVENOUS | Status: DC
  Administered 2024-06-01 – 2024-06-06 (×12): 3 mL via INTRAVENOUS

## 2024-05-31 MED ORDER — INSULIN ASPART 100 UNIT/ML IJ SOLN: 0.0000 [IU] | Freq: Every day | INTRAMUSCULAR | Status: DC

## 2024-05-31 MED ORDER — HEPARIN SODIUM (PORCINE) 5000 UNIT/ML IJ SOLN
5000.0000 [IU] | Freq: Three times a day (TID) | INTRAMUSCULAR | Status: DC
  Administered 2024-06-01 – 2024-06-06 (×17): 5000 [IU] via SUBCUTANEOUS
  Filled 2024-05-31 (×17): qty 1

## 2024-05-31 MED ORDER — ASPIRIN 81 MG PO CHEW
324.0000 mg | CHEWABLE_TABLET | Freq: Once | ORAL | Status: AC
  Administered 2024-05-31: 324 mg via ORAL
  Filled 2024-05-31: qty 4

## 2024-05-31 MED ORDER — INSULIN ASPART 100 UNIT/ML IJ SOLN
0.0000 [IU] | Freq: Three times a day (TID) | INTRAMUSCULAR | Status: DC
  Administered 2024-06-01: 3 [IU] via SUBCUTANEOUS
  Filled 2024-05-31: qty 3

## 2024-05-31 NOTE — ED Triage Notes (Signed)
 C/O bilateral leg swelling, hypertension. Pt states he is supposed to take HTN meds but has not.  Denies headaches. C/O [facility] since today.

## 2024-05-31 NOTE — ED Triage Notes (Signed)
 Pt c/o bilateral leg swelling for a long time. States had a long car ride back in April. Pt states hasn't taken his b/p meds in a long time. B/p elevated today.

## 2024-05-31 NOTE — ED Provider Notes (Signed)
 " [place] EMERGENCY DEPARTMENT AT [facility] Provider Note   CSN: [id number] Arrival date & time: 05/31/24  1405     Patient presents with: Hypertension   MELVEN STOCKARD is a 39 y.o. male.   The patient is a 39 year old with hypertension and type 1 diabetes who presents with bilateral leg swelling and shortness of breath. He is accompanied by his mother.  He reports bilateral leg swelling for several months, with a marked increase in severity today. The swelling extends from his feet up his legs. At urgent care he was told his blood pressure was very elevated.  He has shortness of breath with walking that has recently improved. He notes low energy. His mother has noticed atypical behavior at work.  He has type 1 diabetes with inconsistent insulin  use and high carbohydrate intake. He notes increased thirst and frequent urination.  He recalls being advised in the past to take aspirin  for possible heart problems, but he has not seen a cardiologist recently.        Prior to Admission medications  Medication Sig Start Date End Date Taking? Authorizing Provider  aspirin  EC 81 MG tablet Take 1 tablet (81 mg total) by mouth daily. 12/25/18   Darron Deatrice LABOR, MD  atorvastatin  (LIPITOR) 10 MG tablet TAKE 1 TABLET BY MOUTH EVERY DAY Patient not taking: Reported on 09/07/2023 08/21/21   Hershal Ozell PARAS, MD  Continuous Glucose Receiver (DEXCOM G7 RECEIVER) DEVI Please use per instructions 09/09/23   Elgergawy, Brayton RAMAN, MD  Continuous Glucose Sensor (DEXCOM G7 SENSOR) MISC Please use per instruction 09/09/23   Elgergawy, Brayton RAMAN, MD  Insulin  Lispro Prot & Lispro (HUMALOG  MIX 75/25 KWIKPEN) (75-25) 100 UNIT/ML Kwikpen INJECT 28 UNITS INTO THE SKIN IN THE MORNING AND 24 UNITS IN THE EVENING 07/08/22   Rolinda Rogue, MD  Insulin  Pen Needle (BD PEN NEEDLE MICRO U/F) 32G X 6 MM MISC USE TO INJECT INSULIN  6 TIMES DAILY. AS NEEDED 07/08/22   Rolinda Rogue, MD  lisinopril  (ZESTRIL )  10 MG tablet TAKE 1 TABLET BY MOUTH EVERY DAY Patient not taking: Reported on 09/07/2023 02/15/22   Hershal Ozell PARAS, MD  [facility] VERIO test strip CHECK BLOOD SUGAR 8 TIMES DAILY 11/09/21   Brennan, Michael J, MD  SYNTHROID  25 MCG tablet TAKE 1 TABLET BY MOUTH EVERY DAY BEFORE BREAKFAST Patient not taking: Reported on 09/07/2023 08/25/21   Hershal Ozell PARAS, MD    Allergies: Patient has no known allergies.    Review of Systems  Updated Vital Signs BP (!) 174/121   Pulse 92   Temp 98.2 F (36.8 C)   Resp 14   Ht 5' 2 (1.575 m)   Wt 60 kg   SpO2 100%   BMI 24.19 kg/m   Physical Exam Constitutional:      General: He is not in acute distress.    Appearance: Normal appearance. He is normal weight. He is ill-appearing.  Cardiovascular:     Rate and Rhythm: Normal rate and regular rhythm.     Pulses: Normal pulses.     Heart sounds: No murmur heard. Pulmonary:     Effort: Pulmonary effort is normal. No respiratory distress.     Breath sounds: Normal breath sounds.  Abdominal:     General: Abdomen is flat. Bowel sounds are normal. There is no distension.     Palpations: There is no mass.     Tenderness: There is no abdominal tenderness.  Skin:  Capillary Refill: Capillary refill takes less than 2 seconds.  Neurological:     General: No focal deficit present.     Mental Status: He is alert and oriented to person, place, and time. Mental status is at baseline.     (all labs ordered are listed, but only abnormal results are displayed) Labs Reviewed  CBC WITH DIFFERENTIAL/PLATELET - Abnormal; Notable for the following components:      Result Value   RBC 4.13 (*)    Hemoglobin 12.8 (*)    HCT 38.0 (*)    All other components within normal limits  COMPREHENSIVE METABOLIC PANEL WITH GFR - Abnormal; Notable for the following components:   Glucose, Bld 296 (*)    BUN 39 (*)    Creatinine, Ser 2.07 (*)    Total Protein 5.7 (*)    Albumin 2.8 (*)    GFR, Estimated 42 (*)     All other components within normal limits  URINALYSIS, ROUTINE W REFLEX MICROSCOPIC - Abnormal; Notable for the following components:   Color, Urine STRAW (*)    Glucose, UA >=500 (*)    Hgb urine dipstick SMALL (*)    Protein, ur >=300 (*)    All other components within normal limits  PRO BRAIN NATRIURETIC PEPTIDE - Abnormal; Notable for the following components:   Pro Brain Natriuretic Peptide 611.0 (*)    All other components within normal limits  CBG MONITORING, ED - Abnormal; Notable for the following components:   Glucose-Capillary 359 (*)    All other components within normal limits  I-STAT VENOUS BLOOD GAS, ED - Abnormal; Notable for the following components:   pO2, Ven 23 (*)    Sodium 133 (*)    HCT 37.0 (*)    Hemoglobin 12.6 (*)    All other components within normal limits  TROPONIN T, HIGH SENSITIVITY - Abnormal; Notable for the following components:   Troponin T High Sensitivity 114 (*)    All other components within normal limits  BETA-HYDROXYBUTYRIC ACID  LIPASE, BLOOD  TROPONIN T, HIGH SENSITIVITY  TROPONIN T, HIGH SENSITIVITY    EKG: None  Radiology: CT Angio Chest PE W and/or Wo Contrast Result Date: 05/31/2024 EXAM: CTA of the Chest with contrast for PE 05/31/2024 10:32:45 PM TECHNIQUE: CTA of the chest was performed after the administration of 75 mL of iohexol  (OMNIPAQUE ) 350 MG/ML injection. Multiplanar reformatted images are provided for review. MIP images are provided for review. Automated exposure control, iterative reconstruction, and/or weight based adjustment of the mA/kV was utilized to reduce the radiation dose to as low as reasonably achievable. COMPARISON: Comparison with chest radiograph 05/31/2024 and CT chest 11/05/2018. CLINICAL HISTORY: Pulmonary embolism (PE) suspected, high prob. FINDINGS: PULMONARY ARTERIES: Pulmonary arteries are adequately opacified for evaluation. No pulmonary embolism. Main pulmonary artery is normal in caliber. MEDIASTINUM:  The heart and pericardium demonstrate no acute abnormality. Normal heart size. No pericardial effusions. There is no acute abnormality of the thoracic aorta. Normal caliber thoracic aorta. The esophagus is decompressed. Thyroid  gland is normal. LYMPH NODES: Multiple axillary lymph nodes bilaterally without pathologic enlargement, likely reactive. No significant lymphadenopathy in the mediastinum. LUNGS AND PLEURA: Mild dependent atelectasis in the lung bases. No focal consolidation or pulmonary edema. No pleural effusion or pneumothorax. UPPER ABDOMEN: Stranding and edema around the pancreas, anterior pararenal space, and left pericolonic gutter. This is likely to represent acute pancreatitis. The duodenum demonstrates thick wall with stranding around the duodenum possibly representing reactive duodenitis or peptic ulcer disease.  Multiple bilateral renal stones. The largest in the left mid pole measures 5 mm in maximal diameter. SOFT TISSUES AND BONES: No acute bone or soft tissue abnormality. No acute bony abnormalities. IMPRESSION: 1. No pulmonary embolism. 2. Acute pancreatitis. 3. Reactive duodenitis or peptic ulcer disease. Electronically signed by: Elsie Gravely MD 05/31/2024 10:39 PM EST RP Workstation: [id number]   DG Chest 2 View Result Date: 05/31/2024 CLINICAL DATA:  Shortness of breath. EXAM: CHEST - 2 VIEW COMPARISON:  11/05/2018 FINDINGS: Lungs are clear. Cardiomediastinal silhouette is normal. Mild stable curvature of the thoracic spine. IMPRESSION: No active cardiopulmonary disease. Electronically Signed   By: Toribio Agreste M.D.   On: 05/31/2024 16:19     Procedures   Medications Ordered in the ED  nitroGLYCERIN  (NITROSTAT ) SL tablet 0.4 mg (has no administration in time range)  aspirin  chewable tablet 324 mg (324 mg Oral Given 05/31/24 2150)  iohexol  (OMNIPAQUE ) 350 MG/ML injection 75 mL (75 mLs Intravenous Contrast Given 05/31/24 2233)                                    Medical  Decision Making Tao Satz Kleiber is a 39 y.o. male presenting with worsening lower extremity swelling over the last few days. On presentation vital signs significant for elevated blood pressure. Labs significant for elevated BNP, troponin 114, and glucose 296 without anion gap. EKG in NSR without ST changes. On exam he is in no acute distress, +2 pitting edema to bilateral knees, no crackles in the lung bases. CT PE ordered to rule out PE due to elevated troponin which resulted negative for PE. Did show acute pancreatitis, lipase added on to prior collection.   Due to presentation concern for NSTEMI. Loaded with 325 mg ASA. Second troponin pending. Patient is high risk with uncontrolled type I diabetes. Consulted cardiology in the ED for evaluation. Will need medical admission for further workup.    Amount and/or Complexity of Data Reviewed Radiology: ordered.  Risk OTC drugs. Prescription drug management. Decision regarding hospitalization.       Final diagnoses:  Elevated troponin  Bilateral lower extremity edema  Hypertension, unspecified type  Dyspnea on exertion    ED Discharge Orders     None          Cleotilde Perkins, DO 05/31/24 2259    Dreama Longs, MD 06/01/24 1247  "

## 2024-05-31 NOTE — ED Notes (Signed)
 Patient is being discharged from the Urgent Care and sent to the Emergency Department via POV . Per [place] , NP, patient is in need of higher level of care due to HTN with swelling in legs. Patient is aware and verbalizes understanding of plan of care.  Vitals:   05/31/24 1252  BP: (!) 211/139  Pulse: (!) 104  Resp: 16  Temp: 98.5 F (36.9 C)  SpO2: 98%
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
## 2024-05-31 NOTE — ED Notes (Signed)
 Notified triage PA verbally of istat result. KIT

## 2024-05-31 NOTE — Consult Note (Signed)
 "  Cardiology Consultation   Patient ID: [name] MRN: [id number]; DOB: Jul 29, 1985  Admit date: 05/31/2024 Date of Consult: 05/31/2024  PCP:  Freddrick No   [facility] Providers Cardiologist:  Deatrice Cage, MD        Patient Profile: [name] is a 39 y.o. male with a hx of TDM-I, hypertension, hypothyroidism, mild cognitive delay who is being seen 05/31/2024 for the evaluation of shortness of breath at the request of the Emergency Department.  History of Present Illness: [name] dates he has had worsening leg swelling over the past couple months that got worse over the past couple days.  This prompted the visit to urgent care.  Patient states he has also had worsening fatigue over the past several weeks.  He states he may have had chest pain.  But that did not prompt him to come into the ER, and he has not had chest pain since he has been in the ER.  He also denies shortness of breath, orthopnea, dizziness, syncope, fevers, chills.  He's seen cardiology in the past for elevated troponin. Last seen 08/2021. No clearly etiology for his elevated troponins.NM stress test in 2020 was normal. TTE in 2020 showed normal LV function with normal diastolic parameters and no significant valvular disease. He has never had a left heart catheterization. In 2020, LDL of 92.  Patient takes blood pressure pressure medications, however he states he has not been adherent with these medications for some time.  He states he was last adherent with these regimens at least a few months ago.  Patient has no family history of coronary artery disease, cardiac death or cardiac implantable electronic devices.  Patient denies smoking or IV drug use.   Urgent CARE, patient's Systolic blood pressure were in the 210s.  SBP in the 180s, HR 80s, on RA. Notable labs include pro-BNP 611, K 5.1, sCr 2.07, glc 296, BHB nml. 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 Authorizing Provider  aspirin  EC 81 MG tablet Take 1 tablet (81 mg total) by mouth daily. 12/25/18   Cage Deatrice LABOR, MD  atorvastatin  (LIPITOR) 10 MG tablet TAKE 1 TABLET BY MOUTH EVERY DAY Patient not taking: Reported on 09/07/2023 08/21/21   Hershal Ozell PARAS, MD  Continuous Glucose Receiver [facility] G7 RECEIVER) DEVI Please use per instructions 09/09/23   Elgergawy, Brayton RAMAN, MD  Continuous Glucose Sensor (DEXCOM G7 SENSOR) MISC Please use per instruction 09/09/23   Elgergawy, Brayton RAMAN, MD  Insulin  Lispro Prot &  Lispro (HUMALOG  MIX 75/25 KWIKPEN) (75-25) 100 UNIT/ML Kwikpen INJECT 28 UNITS INTO THE SKIN IN THE MORNING AND 24 UNITS IN THE EVENING 07/08/22   Rolinda Rogue, MD  Insulin  Pen Needle (BD PEN NEEDLE MICRO U/F) 32G X 6 MM MISC USE TO INJECT INSULIN  6 TIMES DAILY. AS NEEDED 07/08/22   Rolinda Rogue, MD  lisinopril  (ZESTRIL ) 10 MG tablet TAKE 1 TABLET BY MOUTH EVERY DAY Patient not taking: Reported on 09/07/2023 02/15/22   Hershal Ozell PARAS, MD  [facility] VERIO test strip CHECK BLOOD SUGAR 8 TIMES DAILY 11/09/21   Brennan, Michael J, MD  SYNTHROID  25 MCG tablet TAKE 1 TABLET BY MOUTH EVERY DAY BEFORE  BREAKFAST Patient not taking: Reported on 09/07/2023 08/25/21   Hershal Ozell PARAS, MD    Scheduled Meds:  Continuous Infusions:  PRN Meds: nitroGLYCERIN   Allergies:   Allergies[1]  Social History:   Social History   Socioeconomic History   Marital status: Single    Spouse name: Not on file   Number of children: Not on file   Years of education: 12   Highest education level: Not on file  Occupational History    Employer: [employer]    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   Drug use: No   Sexual activity: Never  Other Topics Concern   Not on file  Social History Narrative   Lives in [place] with parents. Patient has 2 siblings a brother with a cardiac condition and a sister who is healthy.   Patient is legally blind and thus cannot drive.    Notable for developmental delay and illiteracy. 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   Social Drivers of Health   Tobacco Use: Low Risk (05/31/2024)   Patient History    Smoking Tobacco Use: Never    Smokeless Tobacco Use: Never    Passive Exposure: Not on file  Financial Resource Strain: Not on file  Food Insecurity: Not on file  Transportation Needs: Not on file  Physical Activity: Not on file  Stress: Not on file  Social Connections: Not on file  Intimate Partner Violence: Not on file  Depression (EYV7-0): Not on file  Alcohol Screen: Not on file  Housing: Unknown (10/11/2023)   Received from [facility]   Epic    Unable to Pay for Housing in the Last Year: Not on file    Number of Times Moved in the Last Year: Not on file    At any time in the past 12 months, were you homeless or living in a shelter (including now)?: No  Utilities: Not on file  Health Literacy:  Not on file    Family History:    Family History  Problem Relation Age of Onset   Vision loss Father    Retinitis pigmentosa Mother    Diabetes Maternal Grandmother        AODM   Diabetes Paternal Grandmother        AODM   Diabetes Cousin        Second cousin has juvenile-onset DM.     ROS:  Please see the history of present illness.   All other ROS reviewed and negative.     Physical Exam/Data: Vitals:  05/31/24 2019 05/31/24 2030 05/31/24 2045 05/31/24 2200  BP:  (!) 188/116 (!) 171/111 (!) 174/121  Pulse:  85 84 92  Resp:    14  Temp:      TempSrc:      SpO2:  100% 100% 100%  Weight: 60 kg     Height: 5' 2 (1.575 m)      No intake or output data in the 24 hours ending 05/31/24 2328    05/31/2024    8:19 PM 09/07/2023    2:12 PM 07/30/2022   10:09 AM  Last 3 Weights  Weight (lbs) 132 lb 4.4 oz 132 lb 0.9 oz 132 lb  Weight (kg) 60 kg 59.9 kg 59.875 kg     Body mass index is 24.19 kg/m.  General:  Well nourished, well developed, in no acute distress HEENT: normal Neck: no JVD Vascular: No carotid bruits; Distal pulses 2+ bilaterally Cardiac:  normal S1, S2; RRR; no murmur  Lungs:  clear to auscultation bilaterally, no wheezing, rhonchi or rales  Abd: soft, nontender, no hepatomegaly  Ext: 2+ leg swelling bilaterally Musculoskeletal:  No deformities, BUE and BLE strength normal and equal Skin: warm and dry  Neuro:  CNs 2-12 intact, no focal abnormalities noted Psych:  Normal affect   EKG:  The EKG was personally reviewed and demonstrates:  sinus rhythm Telemetry:  Telemetry was personally reviewed and demonstrates:  sinus rhythm  Relevant CV Studies: reviewed  Laboratory Data: High Sensitivity Troponin:  No results for input(s): TROPONINIHS in the last 720 hours.  Recent Labs  Lab 05/31/24 1806  TRNPT 114*      Chemistry Recent Labs  Lab 05/31/24 1451 05/31/24 1506  NA 135 133*  K 5.1 4.8  CL 102  --   CO2 25  --   GLUCOSE 296*  --   BUN  39*  --   CREATININE 2.07*  --   CALCIUM  9.1  --   GFRNONAA 42*  --   ANIONGAP 8  --     Recent Labs  Lab 05/31/24 1451  PROT 5.7*  ALBUMIN 2.8*  AST 25  ALT 25  ALKPHOS 119  BILITOT 0.2   Lipids No results for input(s): CHOL, TRIG, HDL, LABVLDL, LDLCALC, CHOLHDL in the last 168 hours.  Hematology Recent Labs  Lab 05/31/24 1451 05/31/24 1506  WBC 4.9  --   RBC 4.13*  --   HGB 12.8* 12.6*  HCT 38.0* 37.0*  MCV 92.0  --   MCH 31.0  --   MCHC 33.7  --   RDW 12.4  --   PLT 290  --    Thyroid  No results for input(s): TSH, FREET4 in the last 168 hours.  BNP Recent Labs  Lab 05/31/24 1451  PROBNP 611.0*    DDimer No results for input(s): DDIMER in the last 168 hours.  Radiology/Studies:  CT Angio Chest PE W and/or Wo Contrast Result Date: 05/31/2024 EXAM: CTA of the Chest with contrast for PE 05/31/2024 10:32:45 PM TECHNIQUE: CTA of the chest was performed after the administration of 75 mL of iohexol  (OMNIPAQUE ) 350 MG/ML injection. Multiplanar reformatted images are provided for review. MIP images are provided for review. Automated exposure control, iterative reconstruction, and/or weight based adjustment of the mA/kV was utilized to reduce the radiation dose to as low as reasonably achievable. COMPARISON: Comparison with chest radiograph 05/31/2024 and CT chest 11/05/2018. CLINICAL HISTORY: Pulmonary embolism (PE) suspected, high prob. FINDINGS: PULMONARY ARTERIES: Pulmonary arteries are adequately opacified for  evaluation. No pulmonary embolism. Main pulmonary artery is normal in caliber. MEDIASTINUM: The heart and pericardium demonstrate no acute abnormality. Normal heart size. No pericardial effusions. There is no acute abnormality of the thoracic aorta. Normal caliber thoracic aorta. The esophagus is decompressed. Thyroid  gland is normal. LYMPH NODES: Multiple axillary lymph nodes bilaterally without pathologic enlargement, likely reactive. No significant  lymphadenopathy in the mediastinum. LUNGS AND PLEURA: Mild dependent atelectasis in the lung bases. No focal consolidation or pulmonary edema. No pleural effusion or pneumothorax. UPPER ABDOMEN: Stranding and edema around the pancreas, anterior pararenal space, and left pericolonic gutter. This is likely to represent acute pancreatitis. The duodenum demonstrates thick wall with stranding around the duodenum possibly representing reactive duodenitis or peptic ulcer disease. Multiple bilateral renal stones. The largest in the left mid pole measures 5 mm in maximal diameter. SOFT TISSUES AND BONES: No acute bone or soft tissue abnormality. No acute bony abnormalities. IMPRESSION: 1. No pulmonary embolism. 2. Acute pancreatitis. 3. Reactive duodenitis or peptic ulcer disease. Electronically signed by: Elsie Gravely MD 05/31/2024 10:39 PM EST RP Workstation: [id number]   DG Chest 2 View Result Date: 05/31/2024 CLINICAL DATA:  Shortness of breath. EXAM: CHEST - 2 VIEW COMPARISON:  11/05/2018 FINDINGS: Lungs are clear. Cardiomediastinal silhouette is normal. Mild stable curvature of the thoracic spine. IMPRESSION: No active cardiopulmonary disease. Electronically Signed   By: Toribio Agreste M.D.   On: 05/31/2024 16:19     Assessment and Plan:  [name] is a 39 y.o. male with a hx of TDM-I, hypertension, hypothyroidism, mild cognitive delay who is being seen 05/31/2024 for the evaluation of shortness of breath at the request of the Emergency Department.  Patient does not endorse chest pain and has no evidence of ischemia on EKG.  No evidence of ventricular arrhythmias on ECG.  He does have leg swelling, however he does not have elevated jugular venous pressure.  He is warm on exam and otherwise has no evidence of marked volume overload.  He has downtrending troponins, suggestive of acute myocardial injury with improvement.  proBNP is elevated at 600.  Etiology likely related to hypertension.  He does  not have risk factors to suggest cardiomyopathy.  We will obtain inflammatory markers while he is here.  Can can try mild diuresis, however I would not go overboard.  Patient does not require workup or management of acute coronary syndrome.  Recommendations -TTE -inflammatory markers -restart home BP medications   Risk Assessment/Risk Scores:    TIMI Risk Score for Unstable Angina or Non-ST Elevation MI:   The patient's TIMI risk score is  , which indicates a  % risk of all cause mortality, new or recurrent myocardial infarction or need for urgent revascularization in the next 14 days.         For questions or updates, please contact [facility] Please consult [email or web address] for contact info under      Signed, Bobbe Quilter A Jakorian Marengo, MD  05/31/2024 11:28 PM      [1] No Known Allergies  "

## 2024-05-31 NOTE — ED Provider Triage Note (Signed)
 Emergency Medicine Provider Triage Evaluation Note  [name] , a 39 y.o. male  was evaluated in triage.  Pt complains of high blood pressure and high glucose level. Seen at urgent care today where his glucose level was 434 and had high blood pressure and was recommended to come to the ED for further evaluation.  Also endorses bilateral leg swelling for the past 2 months.  Has a history of diabetes 1 and took 30 units of insulin  this morning.  Denies blurry vision, weakness, numbness or tingling, fevers, cough, nausea chest pain, abdominal pain, headaches, urinary symptoms, diarrhea, constipation.  Did not take his blood pressure medication today.  Review of Systems  Positive: SOB, bilateral leg swelling Negative: Fever, chills, headache, dizziness, weakness  Physical Exam  BP (!) 185/119 (BP Location: Left Arm)   Pulse 100   Temp 97.7 F (36.5 C) (Oral)   Resp 18   Ht 5' 2 (1.575 m)   SpO2 100%   BMI 24.15 kg/m  Gen:   Awake, no distress   Resp:  Normal effort, clear lung sounds MSK:   Moves extremities without difficulty, bilateral 2+ pitting edema Other:  Neurovascularly intact, no focal deficit.  Station intact  Medical Decision Making  Medically screening exam initiated at 2:49 PM.  Appropriate orders placed.  Hollie Bartus Giebel was informed that the remainder of the evaluation will be completed by another provider, this initial triage assessment does not replace that evaluation, and the importance of remaining in the ED until their evaluation is complete.  CBC, CMP, VBG, urinalysis, CBG, beta hydroxybutyrate acid, chest x-ray, BNP    Braxton Dubois, PA-C 05/31/24 1449

## 2024-05-31 NOTE — H&P (Signed)
 " History and Physical    Patient: [name] FMW:[id number] DOB: September 06, 1985 DOA: 05/31/2024 DOS: the patient was seen and examined on 05/31/2024 PCP: Pcp, No  Patient coming from: Home  Chief Complaint:  Chief Complaint  Patient presents with   Hypertension   HPI: [name] is a 39 y.o. male with medical history significant of type 1 diabetes insulin -dependent, diabetic retinopathy was partial blindness, diabetic nephropathy, essential hypertension, autoimmune thyroiditis, hypothyroidism, who has had medication noncompliance presenting to the ER with bilateral leg swelling and shortness of breath.  Symptoms have been going on progressively over the last few months.  They got worse today.  Associated with worsening shortness of breath with exertion.  Associated also with low energy and generalized malaise.  Patient was seen and evaluated in the ER.  Was noted to have elevated proBNP but also elevated troponin with the first set at 114.  Patient also has AKI on CKD 3.  EKG showed no acute changes.  Patient suspected to have type II NSTEMI with new onset CHF.  Patient also has malignant hypertension on arrival.  Cardiology consulted to see patient.  Patient be admitted to the medical service for further evaluation and treatment.  Review of Systems: As mentioned in the history of present illness. All other systems reviewed and are negative. 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 He has never used smokeless tobacco. He reports that he does not drink alcohol and does not use drugs.  Allergies[1]  Family History  Problem Relation Age of Onset   Vision loss Father    Retinitis pigmentosa Mother    Diabetes Maternal Grandmother        AODM   Diabetes Paternal Grandmother        AODM   Diabetes Cousin        Second cousin has juvenile-onset DM.    Prior to Admission medications  Medication Sig Start Date End Date Taking? Authorizing Provider  aspirin  EC 81 MG tablet Take 1 tablet (81 mg total) by mouth daily. 12/25/18   Darron Deatrice LABOR, MD  atorvastatin  (LIPITOR) 10 MG tablet TAKE 1 TABLET BY MOUTH EVERY DAY Patient not taking: Reported on 09/07/2023 08/21/21   Hershal Ozell PARAS, MD  Continuous Glucose Receiver (DEXCOM G7 RECEIVER) DEVI Please use per instructions 09/09/23   Elgergawy, Brayton RAMAN, MD  Continuous Glucose Sensor (DEXCOM G7 SENSOR) MISC Please use per instruction 09/09/23   Elgergawy, Brayton RAMAN, MD  Insulin  Lispro Prot & Lispro (HUMALOG  MIX 75/25 KWIKPEN) (75-25) 100 UNIT/ML Kwikpen INJECT 28 UNITS INTO THE SKIN IN THE MORNING AND  24 UNITS IN THE EVENING 07/08/22   Rolinda Rogue, MD  Insulin  Pen Needle (BD PEN NEEDLE MICRO U/F) 32G X 6 MM MISC USE TO INJECT INSULIN  6 TIMES DAILY. AS NEEDED 07/08/22   Rolinda Rogue, MD  lisinopril  (ZESTRIL ) 10 MG tablet TAKE 1 TABLET BY MOUTH EVERY DAY Patient not taking: Reported on 09/07/2023 02/15/22   Hershal Ozell PARAS, MD  [facility] VERIO test strip CHECK BLOOD SUGAR 8 TIMES DAILY 11/09/21   Hershal Ozell PARAS, MD  SYNTHROID  25 MCG tablet TAKE 1 TABLET BY MOUTH EVERY DAY BEFORE BREAKFAST Patient not taking: Reported on 09/07/2023 08/25/21   Hershal Ozell PARAS, MD    Physical  Exam: Vitals:   05/31/24 2019 05/31/24 2030 05/31/24 2045 05/31/24 2200  BP:  (!) 188/116 (!) 171/111 (!) 174/121  Pulse:  85 84 92  Resp:    14  Temp:      TempSrc:      SpO2:  100% 100% 100%  Weight: 60 kg     Height: 5' 2 (1.575 m)      Constitutional: Chronically ill looking, NAD, calm, comfortable Eyes: PERRL, lids and conjunctivae normal, puffy face ENMT: Mucous membranes are moist. Posterior pharynx clear of any exudate or lesions.Normal dentition.  Neck: normal, supple, no masses, no thyromegaly Respiratory: Coarse breath sound with some mild crackles bilaterally, no wheezing, no crackles. Normal respiratory effort. No accessory muscle use.  Cardiovascular: Sinus tachycardia, no murmurs / rubs / gallops.  1+ extremity edema. 2+ pedal pulses. No carotid bruits.  Abdomen: no tenderness, no masses palpated. No hepatosplenomegaly. Bowel sounds positive.  Musculoskeletal: Good range of motion, no joint swelling or tenderness, Skin: no rashes, lesions, ulcers. No induration Neurologic: CN 2-12 grossly intact. Sensation intact, DTR normal. Strength 5/5 in all 4.  Psychiatric: Normal judgment and insight. Alert and oriented x 3. Normal mood  Data Reviewed:  Temperature 98.5, blood pressure 211/139, pulse 104, respiratory rate 18, hemoglobin 12.6, sodium 133 BUN 39 creatinine 2.07 glucose 296.  proBNP 611, troponin 114.  Urinalysis essentially negative.  Chest x-ray showed no acute findings.  CT angiogram of the chest showed no PE but acute pancreatitis and reactive duodenitis.  Assessment and Plan:  #1 acute onset CHF: Suspected CHF based on fluid accumulation and exertional dyspnea.  Probably diastolic in nature.  Will admit the patient.  Gentle diuresis.  Obtain echocardiogram.  Follow cardiology consultation.  #2 possible type II NSTEMI: Suspect symptoms due to malignant hypertension.  Initial troponin 114.  Awaiting subsequent troponins to look at trend.  Cardiology on board and  will advise.  #3 uncontrolled type 1 diabetes: Patient has been noncompliant.  Initiate sliding scale insulin .  Will add long-acting insulin  as needed.  #4 malignant hypertension: Initial blood pressure 211/139.  Responding to basic treatment now.  Will need beta-blockers IV with oral medications.  Monitor on telemetry  #5 CT findings of pancreatitis: Asymptomatic.  Probably incidental.  Will monitor closely  #6 AKI on CKD 3: Continue to monitor renal function.  #7 medication noncompliance: Counseling provided.    Advance Care Planning:   Code Status: Full Code   Consults: Cardiologist on-call  Family Communication: Mother at bedside  Severity of Illness: The appropriate patient status for this patient is INPATIENT. Inpatient status is judged to be reasonable and necessary in order to provide the required intensity of service to ensure the patient's safety. The patient's presenting symptoms, physical exam findings, and initial radiographic and laboratory data in the context of their  chronic comorbidities is felt to place them at high risk for further clinical deterioration. Furthermore, it is not anticipated that the patient will be medically stable for discharge from the hospital within 2 midnights of admission.   * I certify that at the point of admission it is my clinical judgment that the patient will require inpatient hospital care spanning beyond 2 midnights from the point of admission due to high intensity of service, high risk for further deterioration and high frequency of surveillance required.*  AuthorBETHA SIM KNOLL, MD 05/31/2024 11:23 PM  For on call review [email or web address].      [1] No Known Allergies  "

## 2024-05-31 NOTE — Discharge Instructions (Signed)
 I am concerned of your bilateral leg swelling, hypertension, protein in your urine, ketones in your urine and hyperglycemia.  Seek further care at the nearest emergency department.

## 2024-06-01 ENCOUNTER — Other Ambulatory Visit (HOSPITAL_COMMUNITY): Payer: Self-pay

## 2024-06-01 ENCOUNTER — Inpatient Hospital Stay (HOSPITAL_COMMUNITY)

## 2024-06-01 DIAGNOSIS — I509 Heart failure, unspecified: Secondary | ICD-10-CM | POA: Diagnosis not present

## 2024-06-01 LAB — BASIC METABOLIC PANEL WITH GFR
Anion gap: 8 (ref 5–15)
BUN: 32 mg/dL — ABNORMAL HIGH (ref 6–20)
CO2: 23 mmol/L (ref 22–32)
Calcium: 8 mg/dL — ABNORMAL LOW (ref 8.9–10.3)
Chloride: 106 mmol/L (ref 98–111)
Creatinine, Ser: 1.77 mg/dL — ABNORMAL HIGH (ref 0.61–1.24)
GFR, Estimated: 50 mL/min — ABNORMAL LOW
Glucose, Bld: 82 mg/dL (ref 70–99)
Potassium: 3.6 mmol/L (ref 3.5–5.1)
Sodium: 136 mmol/L (ref 135–145)

## 2024-06-01 LAB — CBG MONITORING, ED
Glucose-Capillary: 146 mg/dL — ABNORMAL HIGH (ref 70–99)
Glucose-Capillary: 294 mg/dL — ABNORMAL HIGH (ref 70–99)
Glucose-Capillary: 81 mg/dL (ref 70–99)

## 2024-06-01 LAB — TROPONIN T, HIGH SENSITIVITY
Troponin T High Sensitivity: 105 ng/L (ref 0–19)
Troponin T High Sensitivity: 68 ng/L — ABNORMAL HIGH (ref 0–19)
Troponin T High Sensitivity: 93 ng/L — ABNORMAL HIGH (ref 0–19)

## 2024-06-01 LAB — HIV ANTIBODY (ROUTINE TESTING W REFLEX): HIV Screen 4th Generation wRfx: NONREACTIVE

## 2024-06-01 LAB — CBC
HCT: 34.2 % — ABNORMAL LOW (ref 39.0–52.0)
Hemoglobin: 11.5 g/dL — ABNORMAL LOW (ref 13.0–17.0)
MCH: 31.1 pg (ref 26.0–34.0)
MCHC: 33.6 g/dL (ref 30.0–36.0)
MCV: 92.4 fL (ref 80.0–100.0)
Platelets: 263 K/uL (ref 150–400)
RBC: 3.7 MIL/uL — ABNORMAL LOW (ref 4.22–5.81)
RDW: 12.3 % (ref 11.5–15.5)
WBC: 5.2 K/uL (ref 4.0–10.5)
nRBC: 0 % (ref 0.0–0.2)

## 2024-06-01 LAB — SEDIMENTATION RATE: Sed Rate: 38 mm/h — ABNORMAL HIGH (ref 0–16)

## 2024-06-01 LAB — ECHOCARDIOGRAM COMPLETE
Area-P 1/2: 4.86 cm2
Height: 62 in
S' Lateral: 3.4 cm
Single Plane A4C EF: 53.6 %
Weight: 2116.42 [oz_av]

## 2024-06-01 LAB — GLUCOSE, CAPILLARY
Glucose-Capillary: 308 mg/dL — ABNORMAL HIGH (ref 70–99)
Glucose-Capillary: 207 mg/dL — ABNORMAL HIGH (ref 70–99)

## 2024-06-01 LAB — LIPASE, BLOOD: Lipase: 15 U/L (ref 11–51)

## 2024-06-01 LAB — HEMOGLOBIN A1C
Hgb A1c MFr Bld: 13.6 % — ABNORMAL HIGH (ref 4.8–5.6)
Mean Plasma Glucose: 343.62 mg/dL

## 2024-06-01 LAB — C-REACTIVE PROTEIN: CRP: <0.5 mg/dL

## 2024-06-01 LAB — TSH: TSH: 5.45 u[IU]/mL — ABNORMAL HIGH (ref 0.350–4.500)

## 2024-06-01 MED ORDER — INSULIN ASPART PROT & ASPART (70-30 MIX) 100 UNIT/ML ~~LOC~~ SUSP
15.0000 [IU] | Freq: Two times a day (BID) | SUBCUTANEOUS | Status: DC
  Administered 2024-06-01: 15 [IU] via SUBCUTANEOUS
  Filled 2024-06-01: qty 10

## 2024-06-01 MED ORDER — CARVEDILOL 3.125 MG PO TABS
3.1250 mg | ORAL_TABLET | Freq: Two times a day (BID) | ORAL | Status: DC
  Administered 2024-06-01: 3.125 mg via ORAL
  Filled 2024-06-01: qty 1

## 2024-06-01 MED ORDER — CARVEDILOL 6.25 MG PO TABS
6.2500 mg | ORAL_TABLET | Freq: Two times a day (BID) | ORAL | Status: DC
  Administered 2024-06-02: 6.25 mg via ORAL
  Filled 2024-06-01: qty 1

## 2024-06-01 MED ORDER — INSULIN ASPART 100 UNIT/ML IJ SOLN
0.0000 [IU] | Freq: Three times a day (TID) | INTRAMUSCULAR | Status: DC
  Administered 2024-06-01: 7 [IU] via SUBCUTANEOUS
  Administered 2024-06-01: 5 [IU] via SUBCUTANEOUS
  Administered 2024-06-02 (×3): 3 [IU] via SUBCUTANEOUS
  Filled 2024-06-01 (×2): qty 3
  Filled 2024-06-01: qty 5
  Filled 2024-06-01: qty 7

## 2024-06-01 NOTE — Plan of Care (Signed)

## 2024-06-01 NOTE — Progress Notes (Addendum)
 "  Progress Note  Patient Name: [name] Date of Encounter: 06/01/2024 [facility] Cardiologist: Deatrice Cage, MD   Interval Summary    39 yr old male with PMH of type 1 diabetes, retinitis pigmentosa, mild cognitive delay, autonomic and peripheral neuropathy, HTN, hypothyroidism, who is admitted for uncontrolled type 1 DM with hyperglycemia, AKI, uncontrolled HTN. Cardiology is following for abnormal cardiac enzymes.   Patient reports he had ongoing bilateral lower extremity swelling for 2 to 67-month, some shortness of breath with exertion.  He initially thought he was eating too much salt.  He reports eating takeout such as fast food or pizza fairly often.  He does not check blood pressure at home, is not aware elevated blood pressure, does not take any antihypertensive at baseline.  He takes aspirin , levothyroxine , atorvastatin  at times, denied daily compliance with meds.  He is not aware of any cardiac issue in the past.    Vital Signs Vitals:   06/01/24 0330 06/01/24 0433 06/01/24 0729 06/01/24 0740  BP: (!) 163/110   (!) 160/111  Pulse: 85  91 92  Resp: 18   18  Temp:  98 F (36.7 C)    TempSrc:      SpO2: 98%  100% 100%  Weight:      Height:       No intake or output data in the 24 hours ending 06/01/24 0755    05/31/2024    8:19 PM 09/07/2023    2:12 PM 07/30/2022   10:09 AM  Last 3 Weights  Weight (lbs) 132 lb 4.4 oz 132 lb 0.9 oz 132 lb  Weight (kg) 60 kg 59.9 kg 59.875 kg      Telemetry/ECG  Sinus rhythm/tachycardia  - Personally Reviewed  Physical Exam  GEN: No acute distress.   Neck: No JVD Cardiac: RRR, no murmurs, rubs, or gallops.  Respiratory: Clear to auscultation bilaterally. On room air. Speaks full sentence  GI: Soft, nontender, non-distended  MS: BLE pitting edema 2+ bilaterally   Assessment & Plan   Abnormal cardiac enzymes  Dyspnea with exertion Bilateral lower extremity edema - he had chest pain workup 10/2018 with mild  elevation of troponin which including negative for PE/dissection/coronary calcification, EF 60-65% , low risk negative stress myoview   - Presented last night complaining generalized malaise, fatigue, bilateral lower extremity edema, shortness of breath with exertion for 2-3 months in the setting of medication noncompliance, found to be hypertensive 180-200s systolic without medication, uncontrolled diabetes, AKI -Admission diagnostic revealed initial glucose of 434, Cr 2.07 with GFR 42 and BUN 39, albumin 2.8, pro BNP 611, high sensitive troponin 114 >68>93, hemoglobin A1c 13.6%, CXR revealed no acute finding.  CTA PE study revealed no PE but suspicion of acute pancreatitis and reactive duodenitis or PUD.  EKG without acute finding - Suspect elevated troponin is demand ischemia in the setting of AKI, uncontrolled hyperglycemia, possible heart failure, hypertensive urgency; non-urgent outpatient coronary CT may be considered if dyspnea is not resolving  - No significant pulmonary edema noted on imaging, clinical exam reveals significant bilateral lower extremity pitting edema, lung exam is fairly well, echocardiogram is pending for further evaluation of new onset of heart failure given complaints of DOE and edema; will add TSH, hypoalbuminemia likely contributing to edema - Will hold off initiate IV Lasix  given fairly well lung exam and concurrent AKI with hyperglycemia which may require additional fluid resuscitation, if echocardiogram is unremarkable, would seek other etiology for peripheral edema; if echo  suggestive of heart failure, will start IV Lasix  for diuresis  Hypertensive urgency -Historically not on any antihypertensive at home for blood pressure -SBP 180-200 systolically at admission, 160/111 this morning -Will initiate carvedilol  3.125mg  BID today , if renal index stabilize/allows, consider switch to ACEI/ARB given diabetes (if HF workup not remarkable)  AKI on CKD stage III Uncontrolled  type 1 diabetes with hyperglycemia Medication noncompliance Hypothyroidism Cognitive impairment  - per primary team   For questions or updates, please contact [facility] Please consult [email or web address] for contact info under         Signed, Ailey Wessling, NP   "

## 2024-06-01 NOTE — Progress Notes (Signed)
 " PROGRESS NOTE  [name] SPINNEY  FMW:[id number] DOB: 08/07/1985 DOA: 05/31/2024 PCP: Pcp, No   Brief Narrative: Patient is a 39 year old male with history of insulin -dependent type 1 diabetes, diabetic retinopathy with partial blindness, diabetic nephropathy, hypertension, autoimmune thyroiditis, hypothyroidism who presented with bilateral lower extremity swelling, shortness of breath which are progressive.  Also reported fatigue.  Lab work showed elevated proBNP,, elevated troponin, elevated creatinine from baseline. He was hypertensive on presentation.  CT angiogram of the chest with PE did not show any PE but was suspicious for acute pancreatitis but lipase was normal.  Echo ordered.  Cardiology consulted and following.  Assessment & Plan:  Principal Problem:   Acute CHF (congestive heart failure) (HCC) Active Problems:   Essential hypertension, benign   Impairment level: low vision of both eyes   Intellectual disability   Hypothyroidism, acquired, autoimmune   AKI (acute kidney injury)   Troponin I above reference range   Acute coronary syndrome (HCC)   Noncompliance with treatment   Type 1 diabetes mellitus with hyperglycemia (HCC)   Acute new onset CHF: Elevated proBNP , lower extremity edema.   Echo pending.  Cardiology following.  Remains on room air.Currently on IV Lasix .  Monitor input/output  Elevated troponin/type II NSTEMI: Likely from demand ischemia.  Hypertensive, also with AKI in presentation.  Echo pending.  No chest pain  Uncontrolled diabetes type 1: Recent A1c of 13.6.  Continue monitoring blood sugars.  Continue current insulin  regimen.  Diabetic coordinator consulted  Severe hypertension: Continue current medications.  On Coreg   AKI on CKD stage IIIb: Baseline creatinine around 1.9.  Currently kidney function close to baseline.  History of hyperlipidemia: On Lipitor  History of hypothyroidism: Takes Synthyroid  Suspected pancreatitis: As per CT  imaging,itshowed features of acute pancreatitis,reactive duodenitis or peptic ulcer disease.  Patient does not have any signs of pancreatitis.  Lipase normal.  Continue PPI.        DVT prophylaxis:heparin  injection 5,000 Units Start: 05/31/24 2345     Code Status: Full Code  Family Communication: Friend at bedside  Patient status: Inpatient  Patient is from : Home  Anticipated discharge to: Home  Estimated DC date: 1 to 2 days   Consultants: Cardiology  Procedures: None  Antimicrobials:  Anti-infectives (From admission, onward)    None       Subjective: Patient seen and examined at bedside today.  Hemodynamically stable.  Lying on bed.  Denies any chest pain.  Lower extremity edema seems to have improved.  No shortness of breath or cough.  Remains mildly hypertensive  Objective: Vitals:   06/01/24 0330 06/01/24 0433 06/01/24 0729 06/01/24 0740  BP: (!) 163/110   (!) 160/111  Pulse: 85  91 92  Resp: 18   18  Temp:  98 F (36.7 C)    TempSrc:      SpO2: 98%  100% 100%  Weight:      Height:       No intake or output data in the 24 hours ending 06/01/24 0745 Filed Weights   05/31/24 2019  Weight: 60 kg    Examination:  General exam: Overall comfortable, not in distress HEENT: Visual improvement Respiratory system:  no wheezes or crackles  Cardiovascular system: S1 & S2 heard, RRR.  Gastrointestinal system: Abdomen is nondistended, soft and nontender. Central nervous system: Alert and oriented Extremities: Trace bilateral lower extremity  pitting edema, no clubbing ,no cyanosis Skin: No rashes, no ulcers,no icterus     Data  Reviewed: I have personally reviewed following labs and imaging studies  CBC: Recent Labs  Lab 05/31/24 1451 05/31/24 1506 05/31/24 2347  WBC 4.9  --  5.2  NEUTROABS 3.0  --   --   HGB 12.8* 12.6* 11.5*  HCT 38.0* 37.0* 34.2*  MCV 92.0  --  92.4  PLT 290  --  263   Basic Metabolic Panel: Recent Labs  Lab  05/31/24 1451 05/31/24 1506 06/01/24 0103  NA 135 133* 136  K 5.1 4.8 3.6  CL 102  --  106  CO2 25  --  23  GLUCOSE 296*  --  82  BUN 39*  --  32*  CREATININE 2.07*  --  1.77*  CALCIUM  9.1  --  8.0*     No results found for this or any previous visit (from the past 240 hours).   Radiology Studies: CT Angio Chest PE W and/or Wo Contrast Result Date: 05/31/2024 EXAM: CTA of the Chest with contrast for PE 05/31/2024 10:32:45 PM TECHNIQUE: CTA of the chest was performed after the administration of 75 mL of iohexol  (OMNIPAQUE ) 350 MG/ML injection. Multiplanar reformatted images are provided for review. MIP images are provided for review. Automated exposure control, iterative reconstruction, and/or weight based adjustment of the mA/kV was utilized to reduce the radiation dose to as low as reasonably achievable. COMPARISON: Comparison with chest radiograph 05/31/2024 and CT chest 11/05/2018. CLINICAL HISTORY: Pulmonary embolism (PE) suspected, high prob. FINDINGS: PULMONARY ARTERIES: Pulmonary arteries are adequately opacified for evaluation. No pulmonary embolism. Main pulmonary artery is normal in caliber. MEDIASTINUM: The heart and pericardium demonstrate no acute abnormality. Normal heart size. No pericardial effusions. There is no acute abnormality of the thoracic aorta. Normal caliber thoracic aorta. The esophagus is decompressed. Thyroid  gland is normal. LYMPH NODES: Multiple axillary lymph nodes bilaterally without pathologic enlargement, likely reactive. No significant lymphadenopathy in the mediastinum. LUNGS AND PLEURA: Mild dependent atelectasis in the lung bases. No focal consolidation or pulmonary edema. No pleural effusion or pneumothorax. UPPER ABDOMEN: Stranding and edema around the pancreas, anterior pararenal space, and left pericolonic gutter. This is likely to represent acute pancreatitis. The duodenum demonstrates thick wall with stranding around the duodenum possibly representing  reactive duodenitis or peptic ulcer disease. Multiple bilateral renal stones. The largest in the left mid pole measures 5 mm in maximal diameter. SOFT TISSUES AND BONES: No acute bone or soft tissue abnormality. No acute bony abnormalities. IMPRESSION: 1. No pulmonary embolism. 2. Acute pancreatitis. 3. Reactive duodenitis or peptic ulcer disease. Electronically signed by: Elsie Gravely MD 05/31/2024 10:39 PM EST RP Workstation: [id number]   DG Chest 2 View Result Date: 05/31/2024 CLINICAL DATA:  Shortness of breath. EXAM: CHEST - 2 VIEW COMPARISON:  11/05/2018 FINDINGS: Lungs are clear. Cardiomediastinal silhouette is normal. Mild stable curvature of the thoracic spine. IMPRESSION: No active cardiopulmonary disease. Electronically Signed   By: Toribio Agreste M.D.   On: 05/31/2024 16:19    Scheduled Meds:  furosemide   40 mg Intravenous BID   heparin   5,000 Units Subcutaneous Q8H   insulin  aspart  0-20 Units Subcutaneous TID WC   insulin  aspart  0-5 Units Subcutaneous QHS   sodium chloride  flush  3 mL Intravenous Q12H   Continuous Infusions:  sodium chloride        LOS: 1 day   Ivonne Mustache, MD [facility] P1/01/2025, 7:45 AM  "

## 2024-06-01 NOTE — Progress Notes (Signed)
" °  Echocardiogram 2D Echocardiogram has been performed.  Koleen KANDICE Popper, RDCS 06/01/2024, 8:17 AM "

## 2024-06-01 NOTE — Inpatient Diabetes Management (Signed)
 Inpatient Diabetes Program Recommendations  AACE/ADA: New Consensus Statement on Inpatient Glycemic Control (2015)  Target Ranges:  Prepandial:   less than 140 mg/dL      Peak postprandial:   less than 180 mg/dL (1-2 hours)      Critically ill patients:  140 - 180 mg/dL   Lab Results  Component Value Date   GLUCAP 294 (H) 06/01/2024   HGBA1C 13.6 (H) 05/31/2024    Review of Glycemic Control  Diabetes history: DM1 Outpatient Diabetes medications: Novolog  70/30 10-30 units at breakfast and 25 units with supper, Dexcom G7 Current orders for Inpatient glycemic control: 70/30 15 units BID, Novolog  0-9 TID with meals  HgbA1C - 13.6%, previously 12.7% 3 months ago Last Endo appt - 03/28/2024 - Dr Cherilyn Last DKA admission - 09/07/2023 On insulin  pump in the past but could not manage d/t cognitive issues. Pt is legally blind.   CBGs 146, 294.  Pt has OmniPod education ordered although Mom is having a difficult time setting up pump education appt. States stress has been high at home, but seems to be improved with moving to sister's house. Has not been taking am dose of 70/30 d/t not eating breakfast. Encouraged pt to eat something simple such as peanut butter sandwich with whole wheat bread. Pt agreeable to this. States he can eat protein bar or small snack mid-am, therefore he will start taking his insulin  again each morning. Pt admits to drinking juice, discussed leaving off juice and eating fresh fruit instead.  Inpatient Diabetes Program Recommendations:    Consider increasing 70/30 to 20 units BID if blood sugars > 180 mg/dL.  Discussed hypoglycemia s/s and treatment. Pt states he rarely has lows. Discussed HgbA1C goals   To f/u with Endo in Feb 2026. Follow while inpatient.  Thank you. Shona Brandy, RD, LDN, CDCES Inpatient Diabetes Coordinator [phone]

## 2024-06-01 NOTE — ED Notes (Signed)
" °   06/01/24 1412  Urine Measurement/Characteristics  Urine (mL) 1200 mL  Urinary Incontinence No  Urine Color Yellow/straw  Urine Appearance Clear    "

## 2024-06-01 NOTE — Progress Notes (Signed)
 Admitted from [facility] Emergency Department to [facility] room 3E 25.  Placed on cardiac monitor, vitals and weight obtained. Family at bedside.  Oriented to unit routine.      06/01/24 1518  Vitals  Temp 99 F (37.2 C)  Temp Source Oral  BP (!) 175/105  MAP (mmHg) 125  BP Location Right Arm  BP Method Automatic  Patient Position (if appropriate) Lying  Pulse Rate 95  Pulse Rate Source Monitor  ECG Heart Rate 97  Resp 20  Level of Consciousness  Level of Consciousness Alert  MEWS COLOR  MEWS Score Color Green  Oxygen Therapy  SpO2 97 %  O2 Device Room Air  Pain Assessment  Pain Scale 0-10  Pain Score 0  Height and Weight  Height 5' 3 (1.6 m)  Weight 64.9 kg  Type of Scale Used Standing  Type of Weight Actual  BSA (Calculated - sq m) 1.7 sq meters  BMI (Calculated) 25.35  Weight in (lb) to have BMI = 25 140.8  ECG Monitoring  Telemetry Box Number [id number]- hardwired  Tele Box Verification Completed by Second Verifier Completed  MEWS Score  MEWS Temp 0  MEWS Systolic 0  MEWS Pulse 0  MEWS RR 0  MEWS LOC 0  MEWS Score 0

## 2024-06-01 NOTE — ED Notes (Signed)
 Per provider no need for troponin lab at this time

## 2024-06-02 DIAGNOSIS — I5031 Acute diastolic (congestive) heart failure: Secondary | ICD-10-CM

## 2024-06-02 LAB — BASIC METABOLIC PANEL WITH GFR
Anion gap: 6 (ref 5–15)
BUN: 37 mg/dL — ABNORMAL HIGH (ref 6–20)
CO2: 25 mmol/L (ref 22–32)
Calcium: 8.4 mg/dL — ABNORMAL LOW (ref 8.9–10.3)
Chloride: 103 mmol/L (ref 98–111)
Creatinine, Ser: 2.26 mg/dL — ABNORMAL HIGH (ref 0.61–1.24)
GFR, Estimated: 38 mL/min — ABNORMAL LOW
Glucose, Bld: 104 mg/dL — ABNORMAL HIGH (ref 70–99)
Potassium: 3.9 mmol/L (ref 3.5–5.1)
Sodium: 135 mmol/L (ref 135–145)

## 2024-06-02 LAB — GLUCOSE, CAPILLARY
Glucose-Capillary: 223 mg/dL — ABNORMAL HIGH (ref 70–99)
Glucose-Capillary: 225 mg/dL — ABNORMAL HIGH (ref 70–99)
Glucose-Capillary: 229 mg/dL — ABNORMAL HIGH (ref 70–99)
Glucose-Capillary: 246 mg/dL — ABNORMAL HIGH (ref 70–99)

## 2024-06-02 LAB — T4, FREE: Free T4: 0.93 ng/dL (ref 0.80–2.00)

## 2024-06-02 LAB — LIPOPROTEIN A (LPA): Lipoprotein (a): 293.3 nmol/L — ABNORMAL HIGH

## 2024-06-02 MED ORDER — PANTOPRAZOLE SODIUM 40 MG PO TBEC
40.0000 mg | DELAYED_RELEASE_TABLET | Freq: Every day | ORAL | Status: DC
  Administered 2024-06-02 – 2024-06-06 (×5): 40 mg via ORAL
  Filled 2024-06-02 (×5): qty 1

## 2024-06-02 MED ORDER — INSULIN ASPART PROT & ASPART (70-30 MIX) 100 UNIT/ML ~~LOC~~ SUSP
20.0000 [IU] | Freq: Two times a day (BID) | SUBCUTANEOUS | Status: DC
  Administered 2024-06-02 – 2024-06-03 (×3): 20 [IU] via SUBCUTANEOUS

## 2024-06-02 MED ORDER — LABETALOL HCL 5 MG/ML IV SOLN: 10.0000 mg | INTRAVENOUS | Status: DC | PRN

## 2024-06-02 MED ORDER — ORAL CARE MOUTH RINSE: 15.0000 mL | OROMUCOSAL | Status: DC | PRN

## 2024-06-02 MED ORDER — CARVEDILOL 12.5 MG PO TABS
12.5000 mg | ORAL_TABLET | Freq: Two times a day (BID) | ORAL | Status: DC
  Administered 2024-06-02 – 2024-06-03 (×2): 12.5 mg via ORAL
  Filled 2024-06-02 (×2): qty 1

## 2024-06-02 NOTE — Evaluation (Signed)
 Physical Therapy Evaluation Patient Details Name: [name] MRN: [id number] DOB: Jun 17, 1985 Today's Date: 06/02/2024  History of Present Illness  Pt is a 39 y.o. M presenting to [facility] on 05/31/24 with BLE swelling and SOB. Pt admitted with CHF. PMH is significant for HTN, impaired vision, hypothyroidism, AKI, and T1DM.  Clinical Impression  Prior to admittance, pt was mod I with mobility, utilizing walking stick secondary to vision impairment, and was needing assistance for cooking meals. Pt presents to evaluation near baseline level of function. Pt ambulated 250' with HHA for guiding and no physical assistance. Pt was encouraged to mobilize frequently while admitted to preserve mobility and strength. Anticipate no PT needs at discharge. PT signing off at this time.     BP Supine: 156/111 HR: 90s    If plan is discharge home, recommend the following: Assist for transportation   Can travel by private vehicle        Equipment Recommendations None recommended by PT  Recommendations for Other Services       Functional Status Assessment Patient has not had a recent decline in their functional status     Precautions / Restrictions Precautions Precautions: Fall Recall of Precautions/Restrictions: Intact Restrictions Weight Bearing Restrictions Per Provider Order: No      Mobility  Bed Mobility Overal bed mobility: Modified Independent             General bed mobility comments: increased time to complete    Transfers Overall transfer level: Modified independent Equipment used: None               General transfer comment: Pt able to complete STS from EOB w/out AD and no physical assistance. Increased time to complete.    Ambulation/Gait Ambulation/Gait assistance: Supervision Gait Distance (Feet): 250 Feet Assistive device: 1 person hand held assist (to immitate walking stick) Gait Pattern/deviations: WFL(Within Functional Limits) Gait velocity:  functional Gait velocity interpretation: 1.31 - 2.62 ft/sec, indicative of limited community ambulator   General Gait Details: Pt demonstrates reciprocal gait pattern with no LOB or signs of instability. Pt utilized HHA from therapist for guiding secondary to vision impairment.  Stairs            Wheelchair Mobility     Tilt Bed    Modified Rankin (Stroke Patients Only)       Balance Overall balance assessment: Modified Independent                                           Pertinent Vitals/Pain Pain Assessment Pain Assessment: No/denies pain    Home Living Family/patient expects to be discharged to:: Private residence Living Arrangements: Parent;Non-relatives/Friends Available Help at Discharge: Family;Available 24 hours/day;Friend(s) Type of Home: House Home Access: Stairs to enter Entrance Stairs-Rails: None Entrance Stairs-Number of Steps: 1   Home Layout: One level Home Equipment:  (walking stick for vision)      Prior Function Prior Level of Function : Independent/Modified Independent;Working/employed             Mobility Comments: mod I (uses walking stick secondary to vision impairment) ADLs Comments: pt has someone else cook meals for him but he is indep for all other ADLs     Extremity/Trunk Assessment   Upper Extremity Assessment Upper Extremity Assessment: Overall WFL for tasks assessed    Lower Extremity Assessment Lower Extremity Assessment: Overall WFL for tasks assessed  Cervical / Trunk Assessment Cervical / Trunk Assessment: Normal  Communication   Communication Communication: No apparent difficulties    Cognition Arousal: Alert Behavior During Therapy: WFL for tasks assessed/performed   PT - Cognitive impairments: No apparent impairments                         Following commands: Intact       Cueing Cueing Techniques: Verbal cues     General Comments General comments (skin integrity,  edema, etc.): BP supine: 156/111, HR: 90s    Exercises     Assessment/Plan    PT Assessment Patient does not need any further PT services  PT Problem List         PT Treatment Interventions      PT Goals (Current goals can be found in the Care Plan section)  Acute Rehab PT Goals Patient Stated Goal: to go home PT Goal Formulation: With patient/family Time For Goal Achievement: 06/16/24 Potential to Achieve Goals: Good    Frequency       Co-evaluation               AM-PAC PT 6 Clicks Mobility  Outcome Measure Help needed turning from your back to your side while in a flat bed without using bedrails?: None Help needed moving from lying on your back to sitting on the side of a flat bed without using bedrails?: None Help needed moving to and from a bed to a chair (including a wheelchair)?: A Little Help needed standing up from a chair using your arms (e.g., wheelchair or bedside chair)?: A Little Help needed to walk in hospital room?: A Little Help needed climbing 3-5 steps with a railing? : A Little 6 Click Score: 20    End of Session Equipment Utilized During Treatment: Gait belt Activity Tolerance: Patient tolerated treatment well Patient left: in bed;with call bell/phone within reach;with family/visitor present Nurse Communication: Mobility status;Other (comment) (Pt requesting to ambulate in room and in hallway. Therapist reported comfortability with the idea, but is deferring to RN's comfortability given elevated BP.) PT Visit Diagnosis: Pain Pain - part of body:  (abdominal)    Time: 1211-1232 PT Time Calculation (min) (ACUTE ONLY): 21 min   Charges:   PT Evaluation $PT Eval Low Complexity: 1 Low   PT General Charges $$ ACUTE PT VISIT: 1 Visit         Leontine Hilt DPT Acute Rehab Services [phone] Prefer contact via chat   Leontine NOVAK Ammanda Dobbins 06/02/2024, 12:42 PM

## 2024-06-02 NOTE — Progress Notes (Signed)
 " PROGRESS NOTE  [name]  FMW:[id number] DOB: 12-09-1985 DOA: 05/31/2024 PCP: Pcp, No   Brief Narrative: Patient is a 39 year old male with history of insulin -dependent type 1 diabetes, diabetic retinopathy with partial blindness, diabetic nephropathy, hypertension, autoimmune thyroiditis, hypothyroidism who presented with bilateral lower extremity swelling, shortness of breath which are progressive.  Also reported fatigue.  Lab work showed elevated proBNP, elevated troponin, elevated creatinine from baseline. He was hypertensive on presentation.  CT angiogram of the chest with PE did not show any PE but was suspicious for acute pancreatitis but lipase was normal. .  Cardiology consulted and following.  Given IV Lasix .  Assessment & Plan:  Principal Problem:   Acute CHF (congestive heart failure) (HCC) Active Problems:   Essential hypertension, benign   Impairment level: low vision of both eyes   Intellectual disability   Hypothyroidism, acquired, autoimmune   AKI (acute kidney injury)   Troponin I above reference range   Acute coronary syndrome (HCC)   Noncompliance with treatment   Type 1 diabetes mellitus with hyperglycemia (HCC)   LE edema: Elevated proBNP , lower extremity edema.  Part of the edema was also contributed by low albumin.  Echo showed EF of 50 to 55%, no wall motion abnormality, normal right ventricle function.  Cardiology aware following.  Remains on room air.Lasix  stopped due to AKI.  Still has bilateral lower extremity edema but edema looks like nonpitting.  Could have been contributed by low albumin.  Elevated troponin/type II NSTEMI: Likely from demand ischemia.  Hypertensive, also with AKI in presentation.  Echo did not show any wall motion.  No chest pain  Uncontrolled diabetes type 1: Recent A1c of 13.6.  Continue monitoring blood sugars.  Continue current insulin  regimen.  Diabetic coordinator consulted.  Follows with endocrinologist.  Diabetic  coordinator following.  Severe hypertension: Started on Coreg   AKI on CKD stage IIIb: Baseline creatinine around 1.9.  Creatinine slightly up from baseline today.  Lasix  stopped.  Will do renal ultrasound.  Will refer him to nephrology for outpatient follow-up.  He does not have a PCP.  TOC consulted for PCP arrangement  History of hyperlipidemia: On Lipitor  Abnormal UQU:Dophyuob elevated TSH.  Will check free T4  Suspected pancreatitis: As per CT imaging,itshowed features of acute pancreatitis,reactive duodenitis or peptic ulcer disease.  Patient does not have any signs of pancreatitis.  Lipase normal.  Continue PPI.        DVT prophylaxis:Place TED hose Start: 06/02/24 0914 heparin  injection 5,000 Units Start: 05/31/24 2345     Code Status: Full Code  Family Communication: Family at bedside  Patient status: Inpatient  Patient is from : Home  Anticipated discharge to: Home  Estimated DC date: Tomorrow   Consultants: Cardiology  Procedures: None  Antimicrobials:  Anti-infectives (From admission, onward)    None       Subjective: Patient seen and examined at bedside today.  He says he feels much better today.  On room air.  No chest pain or shortness of breath.  Still has bilateral lower extremity edema but edema .  Lungs are clear on auscultation.  Blood pressure on the higher side  Objective: Vitals:   06/01/24 2302 06/02/24 0223 06/02/24 0814 06/02/24 0900  BP: (!) 150/99  (!) 167/166 (!) 157/111  Pulse:  82 98 95  Resp: 18 14  (!) 22  Temp: 98.4 F (36.9 C) (!) 97.4 F (36.3 C)  98.7 F (37.1 C)  TempSrc: Oral Oral  Oral  SpO2: 92%   100%  Weight:  64.7 kg    Height:        Intake/Output Summary (Last 24 hours) at 06/02/2024 1023 Last data filed at 06/02/2024 0900 Gross per 24 hour  Intake 720 ml  Output 3300 ml  Net -2580 ml   Filed Weights   05/31/24 2019 06/01/24 1518 06/02/24 0223  Weight: 60 kg 64.9 kg 64.7 kg     Examination:   General exam: Overall comfortable, not in distress HEENT: visual impairment Respiratory system:  no wheezes or crackles  Cardiovascular system: S1 & S2 heard, RRR.  Gastrointestinal system: Abdomen is nondistended, soft and nontender. Central nervous system: Alert and oriented Extremities: Bilateral lower extremity edema, no clubbing ,no cyanosis Skin: No rashes, no ulcers,no icterus     Data Reviewed: I have personally reviewed following labs and imaging studies  CBC: Recent Labs  Lab 05/31/24 1451 05/31/24 1506 05/31/24 2347  WBC 4.9  --  5.2  NEUTROABS 3.0  --   --   HGB 12.8* 12.6* 11.5*  HCT 38.0* 37.0* 34.2*  MCV 92.0  --  92.4  PLT 290  --  263   Basic Metabolic Panel: Recent Labs  Lab 05/31/24 1451 05/31/24 1506 06/01/24 0103 06/02/24 0205  NA 135 133* 136 135  K 5.1 4.8 3.6 3.9  CL 102  --  106 103  CO2 25  --  23 25  GLUCOSE 296*  --  82 104*  BUN 39*  --  32* 37*  CREATININE 2.07*  --  1.77* 2.26*  CALCIUM  9.1  --  8.0* 8.4*     No results found for this or any previous visit (from the past 240 hours).   Radiology Studies: ECHOCARDIOGRAM COMPLETE Result Date: 06/01/2024    ECHOCARDIOGRAM REPORT   Patient Name:   [name] Date of Exam: 06/01/2024 Medical Rec #:  [id number]             Height:       62.0 in Accession #:    [id number]            Weight:       132.3 lb Date of Birth:  [birth year]             BSA:          1.604 m Patient Age:    38 years              BP:           171/118 mmHg Patient Gender: M                     HR:           89 bpm. Exam Location:  Inpatient Procedure: 2D Echo, Cardiac Doppler, Color Doppler, Strain Analysis and 3D Echo            (Both Spectral and Color Flow Doppler were utilized during            procedure). Indications:    CHF- Acute Diastolic I50.31  History:        Patient has prior history of Echocardiogram examinations, most                 recent 11/06/2018. CHF; Risk Factors:Diabetes  and Hypertension.  Sonographer:    Koleen Popper RDCS Referring Phys: EMERY LITTIE FUSS IMPRESSIONS  1. Left ventricular ejection fraction, by estimation, is 50 to 55%. Left ventricular ejection fraction by  3D volume is 50 %. The left ventricle has low normal function. The left ventricle has no regional wall motion abnormalities. Indeterminate diastolic filling due to E-A fusion.  2. Right ventricular systolic function is normal. The right ventricular size is normal. Tricuspid regurgitation signal is inadequate for assessing PA pressure.  3. The mitral valve is normal in structure. No evidence of mitral valve regurgitation. No evidence of mitral stenosis.  4. The aortic valve is tricuspid. Aortic valve regurgitation is not visualized. No aortic stenosis is present.  5. The inferior vena cava is normal in size with greater than 50% respiratory variability, suggesting right atrial pressure of 3 mmHg. FINDINGS  Left Ventricle: Left ventricular ejection fraction, by estimation, is 50 to 55%. Left ventricular ejection fraction by 3D volume is 50 %. The left ventricle has low normal function. The left ventricle has no regional wall motion abnormalities. The left ventricular internal cavity size was normal in size. There is no left ventricular hypertrophy. Indeterminate diastolic filling due to E-A fusion. Right Ventricle: The right ventricular size is normal. No increase in right ventricular wall thickness. Right ventricular systolic function is normal. Tricuspid regurgitation signal is inadequate for assessing PA pressure. Left Atrium: Left atrial size was normal in size. Right Atrium: Right atrial size was normal in size. Pericardium: There is no evidence of pericardial effusion. Mitral Valve: The mitral valve is normal in structure. No evidence of mitral valve regurgitation. No evidence of mitral valve stenosis. Tricuspid Valve: The tricuspid valve is normal in structure. Tricuspid valve regurgitation is not  demonstrated. No evidence of tricuspid stenosis. Aortic Valve: The aortic valve is tricuspid. Aortic valve regurgitation is not visualized. No aortic stenosis is present. Pulmonic Valve: The pulmonic valve was normal in structure. Pulmonic valve regurgitation is not visualized. No evidence of pulmonic stenosis. Aorta: The aortic root is normal in size and structure. Venous: The inferior vena cava is normal in size with greater than 50% respiratory variability, suggesting right atrial pressure of 3 mmHg. IAS/Shunts: No atrial level shunt detected by color flow Doppler. Additional Comments: 3D was performed not requiring image post processing on an independent workstation and was normal.  LEFT VENTRICLE PLAX 2D LVIDd:         4.60 cm         Diastology LVIDs:         3.40 cm         LV e' medial:    5.00 cm/s LV PW:         0.80 cm         LV E/e' medial:  9.4 LV IVS:        0.80 cm         LV e' lateral:   6.96 cm/s LVOT diam:     1.90 cm         LV E/e' lateral: 6.8 LV SV:         33 LV SV Index:   21 LVOT Area:     2.84 cm        3D Volume EF                                LV 3D EF:    Left  ventricul LV Volumes (MOD)                            ar LV vol d, MOD    121.0 ml                   ejection A4C:                                        fraction LV vol s, MOD    56.1 ml                    by 3D A4C:                                        volume is LV SV MOD A4C:   121.0 ml                   50 %.                                 3D Volume EF:                                3D EF:        50 %                                LV EDV:       144 ml                                LV ESV:       72 ml                                LV SV:        72 ml RIGHT VENTRICLE             IVC RV Basal diam:  3.10 cm     IVC diam: 1.30 cm RV S prime:     14.40 cm/s TAPSE (M-mode): 1.9 cm LEFT ATRIUM           Index        RIGHT ATRIUM           Index LA diam:      2.30 cm 1.43 cm/m    RA Area:     10.60 cm LA Vol (A4C): 19.4 ml 12.10 ml/m  RA Volume:   22.10 ml  13.78 ml/m  AORTIC VALVE LVOT Vmax:   66.90 cm/s LVOT Vmean:  44.500 cm/s LVOT VTI:    0.117 m  AORTA Ao Root diam: 3.00 cm Ao Asc diam:  3.20 cm MITRAL VALVE MV Area (PHT): 4.86 cm    SHUNTS MV Decel Time: 156 msec    Systemic VTI:  0.12 m MV E velocity: 47.10 cm/s  Systemic Diam: 1.90 cm MV A velocity: 58.50 cm/s MV E/A ratio:  0.81 Soyla Merck MD Electronically signed by  Soyla Merck MD Signature Date/Time: 06/01/2024/12:59:45 PM    Final    CT Angio Chest PE W and/or Wo Contrast Result Date: 05/31/2024 EXAM: CTA of the Chest with contrast for PE 05/31/2024 10:32:45 PM TECHNIQUE: CTA of the chest was performed after the administration of 75 mL of iohexol  (OMNIPAQUE ) 350 MG/ML injection. Multiplanar reformatted images are provided for review. MIP images are provided for review. Automated exposure control, iterative reconstruction, and/or weight based adjustment of the mA/kV was utilized to reduce the radiation dose to as low as reasonably achievable. COMPARISON: Comparison with chest radiograph 05/31/2024 and CT chest 11/05/2018. CLINICAL HISTORY: Pulmonary embolism (PE) suspected, high prob. FINDINGS: PULMONARY ARTERIES: Pulmonary arteries are adequately opacified for evaluation. No pulmonary embolism. Main pulmonary artery is normal in caliber. MEDIASTINUM: The heart and pericardium demonstrate no acute abnormality. Normal heart size. No pericardial effusions. There is no acute abnormality of the thoracic aorta. Normal caliber thoracic aorta. The esophagus is decompressed. Thyroid  gland is normal. LYMPH NODES: Multiple axillary lymph nodes bilaterally without pathologic enlargement, likely reactive. No significant lymphadenopathy in the mediastinum. LUNGS AND PLEURA: Mild dependent atelectasis in the lung bases. No focal consolidation or pulmonary edema. No pleural effusion or pneumothorax. UPPER ABDOMEN: Stranding and  edema around the pancreas, anterior pararenal space, and left pericolonic gutter. This is likely to represent acute pancreatitis. The duodenum demonstrates thick wall with stranding around the duodenum possibly representing reactive duodenitis or peptic ulcer disease. Multiple bilateral renal stones. The largest in the left mid pole measures 5 mm in maximal diameter. SOFT TISSUES AND BONES: No acute bone or soft tissue abnormality. No acute bony abnormalities. IMPRESSION: 1. No pulmonary embolism. 2. Acute pancreatitis. 3. Reactive duodenitis or peptic ulcer disease. Electronically signed by: Elsie Gravely MD 05/31/2024 10:39 PM EST RP Workstation: [id number]   DG Chest 2 View Result Date: 05/31/2024 CLINICAL DATA:  Shortness of breath. EXAM: CHEST - 2 VIEW COMPARISON:  11/05/2018 FINDINGS: Lungs are clear. Cardiomediastinal silhouette is normal. Mild stable curvature of the thoracic spine. IMPRESSION: No active cardiopulmonary disease. Electronically Signed   By: Toribio Agreste M.D.   On: 05/31/2024 16:19    Scheduled Meds:  carvedilol   6.25 mg Oral BID WC   heparin   5,000 Units Subcutaneous Q8H   insulin  aspart  0-9 Units Subcutaneous TID WC   insulin  aspart protamine- aspart  20 Units Subcutaneous BID WC   sodium chloride  flush  3 mL Intravenous Q12H   Continuous Infusions:     LOS: 2 days   Ivonne Mustache, MD [facility] P1/02/2025, 10:23 AM  "

## 2024-06-03 ENCOUNTER — Inpatient Hospital Stay (HOSPITAL_COMMUNITY)

## 2024-06-03 ENCOUNTER — Encounter (HOSPITAL_COMMUNITY): Payer: Self-pay | Admitting: Internal Medicine

## 2024-06-03 DIAGNOSIS — I5031 Acute diastolic (congestive) heart failure: Secondary | ICD-10-CM | POA: Diagnosis not present

## 2024-06-03 LAB — GLUCOSE, CAPILLARY
Glucose-Capillary: 117 mg/dL — ABNORMAL HIGH (ref 70–99)
Glucose-Capillary: 174 mg/dL — ABNORMAL HIGH (ref 70–99)
Glucose-Capillary: 343 mg/dL — ABNORMAL HIGH (ref 70–99)
Glucose-Capillary: 159 mg/dL — ABNORMAL HIGH (ref 70–99)

## 2024-06-03 LAB — BASIC METABOLIC PANEL WITH GFR
Anion gap: 7 (ref 5–15)
BUN: 41 mg/dL — ABNORMAL HIGH (ref 6–20)
CO2: 25 mmol/L (ref 22–32)
Calcium: 8.5 mg/dL — ABNORMAL LOW (ref 8.9–10.3)
Chloride: 106 mmol/L (ref 98–111)
Creatinine, Ser: 2.42 mg/dL — ABNORMAL HIGH (ref 0.61–1.24)
GFR, Estimated: 35 mL/min — ABNORMAL LOW
Glucose, Bld: 148 mg/dL — ABNORMAL HIGH (ref 70–99)
Potassium: 4.1 mmol/L (ref 3.5–5.1)
Sodium: 138 mmol/L (ref 135–145)

## 2024-06-03 LAB — PROTEIN / CREATININE RATIO, URINE
Creatinine, Urine: 70 mg/dL
Protein Creatinine Ratio: 3.6 mg/mg — ABNORMAL HIGH
Total Protein, Urine: 251 mg/dL

## 2024-06-03 MED ORDER — INSULIN ASPART PROT & ASPART (70-30 MIX) 100 UNIT/ML ~~LOC~~ SUSP
25.0000 [IU] | Freq: Two times a day (BID) | SUBCUTANEOUS | Status: DC
  Administered 2024-06-03 – 2024-06-06 (×6): 25 [IU] via SUBCUTANEOUS

## 2024-06-03 MED ORDER — CARVEDILOL 25 MG PO TABS
25.0000 mg | ORAL_TABLET | Freq: Two times a day (BID) | ORAL | Status: DC
  Administered 2024-06-03 – 2024-06-06 (×6): 25 mg via ORAL
  Filled 2024-06-03 (×7): qty 1

## 2024-06-03 MED ORDER — INFLUENZA VIRUS VACC SPLIT PF (FLUZONE) 0.5 ML IM SUSY
0.5000 mL | PREFILLED_SYRINGE | INTRAMUSCULAR | Status: AC
  Administered 2024-06-04: 0.5 mL via INTRAMUSCULAR
  Filled 2024-06-03: qty 0.5

## 2024-06-03 MED ORDER — INSULIN ASPART 100 UNIT/ML IJ SOLN: 0.0000 [IU] | Freq: Every day | INTRAMUSCULAR | Status: DC

## 2024-06-03 MED ORDER — INSULIN ASPART 100 UNIT/ML IJ SOLN
0.0000 [IU] | Freq: Three times a day (TID) | INTRAMUSCULAR | Status: DC
  Administered 2024-06-03 – 2024-06-04 (×2): 7 [IU] via SUBCUTANEOUS
  Administered 2024-06-04: 3 [IU] via SUBCUTANEOUS
  Administered 2024-06-04: 2 [IU] via SUBCUTANEOUS
  Administered 2024-06-05 (×2): 3 [IU] via SUBCUTANEOUS
  Administered 2024-06-05: 2 [IU] via SUBCUTANEOUS
  Filled 2024-06-03 (×2): qty 7
  Filled 2024-06-03: qty 3
  Filled 2024-06-03: qty 7
  Filled 2024-06-03: qty 3
  Filled 2024-06-03 (×2): qty 2

## 2024-06-03 NOTE — Inpatient Diabetes Management (Signed)
 Inpatient Diabetes Program Recommendations  AACE/ADA: New Consensus Statement on Inpatient Glycemic Control   Target Ranges:  Prepandial:   less than 140 mg/dL      Peak postprandial:   less than 180 mg/dL (1-2 hours)      Critically ill patients:  140 - 180 mg/dL    Latest Reference Range & Units 06/02/24 06:20 06/02/24 11:23 06/02/24 16:46 06/02/24 23:46 06/03/24 05:50  Glucose-Capillary 70 - 99 mg/dL 770 (H) 753 (H) 774 (H) 223 (H) 117 (H)   Review of Glycemic Control  Diabetes history: DM1 Outpatient Diabetes medications: 70/30 34 units with breakfast, 24 units with supper Current orders for Inpatient glycemic control: 70/30 20 units BID, Novolog  0-9 units TID with meals  Inpatient Diabetes Program Recommendations:    Insulin : Please consider increasing morning dose of 70/30 to 25 units QAM and continue 70/30 20 units QPM.  NOTE: Noted consult for Diabetes Coordinator. Diabetes Coordinator is not on campus over the weekend but available by pager from 8am to 5pm for questions or concerns.   Thanks, Earnie Gainer, RN, MSN, CDCES Diabetes Coordinator Inpatient Diabetes Program [phone] (Team Pager from 8am to 5pm)

## 2024-06-03 NOTE — Progress Notes (Signed)
 Mother came out of room asking for insulin  so he could eat. MD has discontinued sliding scale. Verified with MD. Patient is to receive his 70/30 insulin  twice daily.  Verdie JONETTA Collier, RN

## 2024-06-03 NOTE — Progress Notes (Signed)
 " PROGRESS NOTE  [name]  FMW:[id number] DOB: Sep 28, 1985 DOA: 05/31/2024 PCP: Pcp, No   Brief Narrative: Patient is a 39 year old male with history of insulin -dependent type 1 diabetes, diabetic retinopathy with partial blindness, diabetic nephropathy, hypertension, autoimmune thyroiditis, hypothyroidism who presented with bilateral lower extremity swelling, shortness of breath which are progressive.  Also reported fatigue.  Lab work showed elevated proBNP, elevated troponin, elevated creatinine from baseline. He was hypertensive on presentation.  CT angiogram of the chest with PE did not show any PE but was suspicious for acute pancreatitis but lipase was normal. .  Cardiology consulted   Given IV Lasix .  Hospitalist team called for worsening of kidney function.  Nephrology consulted today.  Assessment & Plan:  Principal Problem:   Acute CHF (congestive heart failure) (HCC) Active Problems:   Essential hypertension, benign   Impairment level: low vision of both eyes   Intellectual disability   Hypothyroidism, acquired, autoimmune   AKI (acute kidney injury)   Troponin I above reference range   Acute coronary syndrome (HCC)   Noncompliance with treatment   Type 1 diabetes mellitus with hyperglycemia (HCC)   LE edema: Elevated proBNP , lower extremity edema.  Part of the edema was also contributed by low albumin.  Echo showed EF of 50 to 55%, no wall motion abnormality, normal right ventricle function.  Cardiology aware following.  Remains on room air.Lasix  stopped due to AKI.  Still has bilateral lower extremity edema but edema looks like nonpitting.  Could have been contributed by low albumin.  Elevated troponin/type II NSTEMI: Likely from demand ischemia.  Hypertensive, also with AKI in presentation.  Echo did not show any wall motion.  No chest pain  Uncontrolled diabetes type 1: Recent A1c of 13.6.  Continue monitoring blood sugars.  Continue current insulin  regimen.   Diabetic coordinator consulted.  Follows with endocrinologist.  Diabetic coordinator following.  Severe hypertension: Started on Coreg , increase the dose  AKI on CKD stage IIIb: Baseline creatinine around 1.9.   Lasix  stopped.  Will do renal ultrasound.  Creatinine trended up to the range of 2.4 today.  Nephrology consulted.  He needs regular outpatient nephrology follow-up  History of hyperlipidemia: On Lipitor  Abnormal UQU:Dophyuob elevated TSH. Normal free T4  Suspected pancreatitis: As per CT imaging,itshowed features of acute pancreatitis,reactive duodenitis or peptic ulcer disease.  Patient does not have any signs of pancreatitis.  Lipase normal.  Continue PPI.        DVT prophylaxis:Place TED hose Start: 06/02/24 0914 heparin  injection 5,000 Units Start: 05/31/24 2345     Code Status: Full Code  Family Communication: Family at bedside  Patient status: Inpatient  Patient is from : Home  Anticipated discharge to: Home  Estimated DC date: Tomorrow   Consultants: Cardiology  Procedures: None  Antimicrobials:  Anti-infectives (From admission, onward)    None       Subjective: Patient seen and examined at bedside today.  Hemodynamically stable.  He feels comfortable, lying in bed.  Denied any shortness of breath or cough.  He still has bilateral lower extremity edema but he appears euvolemic.  Creatinine trended up today.  We discussed about nephrology consultation.  Objective: Vitals:   06/02/24 2012 06/02/24 2342 06/03/24 0441 06/03/24 0957  BP: (!) 144/93 (!) 136/91 (!) 149/99 (!) 156/102  Pulse: 96 89 77 92  Resp: 20 15 19 13   Temp: 98.3 F (36.8 C) 98.3 F (36.8 C) 97.6 F (36.4 C) 98 F (36.7 C)  TempSrc: Oral Oral Oral Oral  SpO2: 97% 100% 98% 97%  Weight:   62.4 kg   Height:        Intake/Output Summary (Last 24 hours) at 06/03/2024 1113 Last data filed at 06/03/2024 0900 Gross per 24 hour  Intake 480 ml  Output 1900 ml  Net -1420 ml    Filed Weights   06/01/24 1518 06/02/24 0223 06/03/24 0441  Weight: 64.9 kg 64.7 kg 62.4 kg    Examination:   General exam: Overall comfortable, not in distress HEENT: Visual improvement Respiratory system:  no wheezes or crackles  Cardiovascular system: S1 & S2 heard, RRR.  Gastrointestinal system: Abdomen is nondistended, soft and nontender. Central nervous system: Alert and oriented Extremities: Bilateral lower extremity edema Skin: No rashes, no ulcers,no icterus     Data Reviewed: I have personally reviewed following labs and imaging studies  CBC: Recent Labs  Lab 05/31/24 1451 05/31/24 1506 05/31/24 2347  WBC 4.9  --  5.2  NEUTROABS 3.0  --   --   HGB 12.8* 12.6* 11.5*  HCT 38.0* 37.0* 34.2*  MCV 92.0  --  92.4  PLT 290  --  263   Basic Metabolic Panel: Recent Labs  Lab 05/31/24 1451 05/31/24 1506 06/01/24 0103 06/02/24 0205 06/03/24 0237  NA 135 133* 136 135 138  K 5.1 4.8 3.6 3.9 4.1  CL 102  --  106 103 106  CO2 25  --  23 25 25   GLUCOSE 296*  --  82 104* 148*  BUN 39*  --  32* 37* 41*  CREATININE 2.07*  --  1.77* 2.26* 2.42*  CALCIUM  9.1  --  8.0* 8.4* 8.5*     No results found for this or any previous visit (from the past 240 hours).   Radiology Studies: No results found.   Scheduled Meds:  carvedilol   12.5 mg Oral BID WC   heparin   5,000 Units Subcutaneous Q8H   insulin  aspart  0-9 Units Subcutaneous TID WC   insulin  aspart protamine- aspart  25 Units Subcutaneous BID WC   pantoprazole   40 mg Oral Daily   sodium chloride  flush  3 mL Intravenous Q12H   Continuous Infusions:     LOS: 3 days   Ivonne Mustache, MD [facility] P1/03/2025, 11:13 AM  "

## 2024-06-03 NOTE — Plan of Care (Signed)
 Patient ID: JESHURUN OAXACA, male   DOB: [birth year], 39 y.o.   MRN: [id number]    Problem: Education: Goal: Ability to describe self-care measures that may prevent or decrease complications (Diabetes Survival Skills Education) will improve Outcome: Progressing Goal: Individualized Educational Video(s) Outcome: Progressing   Problem: Coping: Goal: Ability to adjust to condition or change in health will improve Outcome: Progressing   Problem: Fluid Volume: Goal: Ability to maintain a balanced intake and output will improve Outcome: Progressing   Problem: Health Behavior/Discharge Planning: Goal: Ability to identify and utilize available resources and services will improve Outcome: Progressing Goal: Ability to manage health-related needs will improve Outcome: Progressing   Problem: Metabolic: Goal: Ability to maintain appropriate glucose levels will improve Outcome: Progressing   Problem: Nutritional: Goal: Maintenance of adequate nutrition will improve Outcome: Progressing Goal: Progress toward achieving an optimal weight will improve Outcome: Progressing   Problem: Skin Integrity: Goal: Risk for impaired skin integrity will decrease Outcome: Progressing   Problem: Tissue Perfusion: Goal: Adequacy of tissue perfusion will improve Outcome: Progressing   Problem: Education: Goal: Knowledge of General Education information will improve Description: Including pain rating scale, medication(s)/side effects and non-pharmacologic comfort measures Outcome: Progressing   Problem: Health Behavior/Discharge Planning: Goal: Ability to manage health-related needs will improve Outcome: Progressing   Problem: Clinical Measurements: Goal: Ability to maintain clinical measurements within normal limits will improve Outcome: Progressing Goal: Will remain free from infection Outcome: Progressing Goal: Diagnostic test results will improve Outcome: Progressing Goal: Respiratory  complications will improve Outcome: Progressing Goal: Cardiovascular complication will be avoided Outcome: Progressing   Problem: Activity: Goal: Risk for activity intolerance will decrease Outcome: Progressing   Problem: Nutrition: Goal: Adequate nutrition will be maintained Outcome: Progressing   Problem: Coping: Goal: Level of anxiety will decrease Outcome: Progressing   Problem: Elimination: Goal: Will not experience complications related to bowel motility Outcome: Progressing Goal: Will not experience complications related to urinary retention Outcome: Progressing   Problem: Pain Managment: Goal: General experience of comfort will improve and/or be controlled Outcome: Progressing   Problem: Safety: Goal: Ability to remain free from injury will improve Outcome: Progressing   Problem: Skin Integrity: Goal: Risk for impaired skin integrity will decrease Outcome: Progressing    Verdie JONETTA Collier, RN

## 2024-06-03 NOTE — Progress Notes (Signed)
 Patient ID: LESS WOOLSEY, male   DOB: 03/05/1986, 39 y.o.   MRN: [id number] Patient called out to report numbness in an area of his right arm above his wrist and below his elbow. Pulses are + 2, cap refill less tha 3 sec. Extremity is warm and normal in color. NIH was negative except for decreased sensation in area. MD made aware.  Patient called back out to say after his arm had been down by his side in a normal position the feeling went away.  Verdie JONETTA Collier, RN

## 2024-06-03 NOTE — Consult Note (Signed)
 Renal Service Consult Note [facility] Lamar JONETTA Fret, MD  Patient: [name] Date: 06/03/2024 Requesting Physician: Dr. Jillian  Reason for Consult: Renal failure HPI: The patient is a 39 y.o. year-old w/ PMH significant for diabetes mellitus, complicated by neuropathy and nephropathy, goiter, HTN, hypothyroidism, impaired cognition, mental retardation, moderate to severe vision impairment, retinitis pigmentosa and autoimmune thyroiditis who presented to ED on January 8 complaining of bilateral leg swelling for a long time.  States he had a long car ride back in April.  Also has not taken his blood pressure meds in a long time.  Blood pressure was elevated at 211/139.  History of type 1 diabetes.  He also has shortness of breath.  The ED MD noted that she had bilateral leg swelling foot for several months and shortness of breath with walking.  He also noted increased thirst and frequent urination.  In the ED troponin was 114 and proBNP was several elevated.  EKG was normal.  BP was 211/139, HR 104, RR 16, temp 98.5.  100% on room air.  K+ 5.1, CO2 25, BUN 39, creatinine 2.07, calcium  9.1, albumin 2.8, LFTs okay.  Chest x-ray was negative for edema or congestion.  proBNP was minimally up at 611.  WBC 4K, Hgb 12.8.  Urinalysis shows glucose greater than thousand 500, protein greater than 300, RBCs 6-10 and WBC/epis 0-5.  He was suspected to have type II NSTEMI with new onset CHF. CTA of chest with contrast showed no pulmonary embolism, acute pancreatitis and reactive duodenitis or peptic ulcer disease.  He also had malignant hypertension on arrival.  Cardiology was consulted.  Patient was admitted.  Echo was ordered.  IV Lasix  was started.  Pt seen in room. Pt doesn't have much to say but is pleasant. Mother arrives on the phone and tells me he has type 1 DM, they have been told in the past that his kidneys are mildly affected.  I explained to her his loss of some kidney  function over the last years the is most likely due to his type 1 DM, especially his hx of proteinuria. I also told her the current bump in kidney numbers is likely due to the IV contrast he got and it should take 3-5 days on average to turn around. Pt has hx of leg swelling but today is not too bad.    ROS - denies CP, no joint pain, no HA, no blurry vision, no rash, no diarrhea, no nausea/ vomiting   Past Medical History  Past Medical History:  Diagnosis Date   Angiopathy, diabetic (HCC)    Diabetes mellitus    Type 1, diagnosed age 71, with frequent DKA admissions, difficult to control due to MR   Diabetic nephropathy (HCC)    Started on Lisinopril  5 mg   Diabetic peripheral neuropathy (HCC)    Dysmorphic features    Goiter    Hypertension    Hypoglycemia associated with diabetes (HCC)    Hypothyroidism    Impaired cognition    mention of mental retardation   Mental retardation    Moderate or severe vision impairment, both eyes, impairment level not further specified    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   Social History  reports that he has never smoked. He has never used smokeless tobacco. He reports that he does not drink alcohol and does not use drugs. Allergies Allergies[1] Home medications Prior to Admission medications  Medication Sig Start Date End Date Taking? Authorizing Provider  NOVOLOG  MIX 70/30 FLEXPEN (70-30) 100 UNIT/ML FlexPen Inject 24-34 Units into the skin 2 (two) times daily with a meal. 34 UNITS WITH BREAKFAST, 24 WITH SUPPER.  MAX DAILY DOSE 55 UNITS 05/02/24  Yes [provider]     Vitals:   06/03/24 0441 06/03/24 0957 06/03/24 1100 06/03/24 1617  BP: (!) 149/99 (!) 156/102 (!) 148/99 (!) 156/99  Pulse: 77 92 85 87  Resp: 19 13 (!) 21 (!) 23  Temp: 97.6 F (36.4 C) 98 F (36.7 C) 98.3 F (36.8 C) 98 F (36.7 C)  TempSrc: Oral Oral Oral Oral  SpO2: 98% 97% 97% 98%  Weight: 62.4 kg     Height:       Exam Gen alert, no distress Sclera anicteric, throat clear  No jvd or bruits Chest clear bilat to bases RRR no MRG Abd soft ntnd no mass or ascites +bs Ext 1+ bilat LE edema, no other edema Neuro is alert, Ox 3 , nf   Home meds: Novolog  70/30 insulin      Date   Creat  AKI peak  eGFR (ml/min) 2008- 2016  0.50- 0.90 2017- 2019  0.80- 1.51 2020   0.96- 1.25 2021   1.00- 2.12 2022   1.18- 1.59      2023   1.12- 1.81 2024   1.24- 1.45   > 60 ml/min  April 2025  1.57- 1.79   2.28  50- 59 ml/min 05/31/24  2.07    42 ml/min 1/09   1.77    50 1/10   2.26    38 06/03/24   2.42    35    BP' s- initial BP's high at 185/118 Today's bp's are 156/ 99 UOP 2.7L, 2.1L and 2.0 L the last 3 days IP meds of interest:  Lasix  40mg  bid on 1/09, dc'd now IV contrast on 1/08 10:30 pm = 75 ml w/ CT scan    UA 1/08 - clear, glu > 500, small Hb, prot > 300, no bact, 6-10 rbc/ 0-5 wbc/ epis Renal US - pending  CXR - 1/08 -> no acute disease Labs today --> Na 138, K+ 4.1, bun 41, creat 2.42  Assessment/ Plan:  # AKI on CKD 3a - b/l creatinine is 1.5- 1.8, eGFR 50-59 ml/min, from April 2025 - Creat here was 2.0 on admission, then 1.7 the next day - IV contrast was given late in the day on 1/08 - creatinine rise started 1/10 up to 2.2 and again up to 2.4 today - AKI suspected due to contrast-induced nephropathy - recommend supportive care - usually takes 3-5 days to recover on average  # CKD 3a - most likely due to diabetic kidney disease - mild LE edema, hx of proteinuria - will get UP/C  ratio   # Uncont HTN - bp was 211/ 139 on admission - his bp meds were resumed and bp's  now are ~ 148/98  # type 1 DM - per pmd   # Edema - got IV lasix  40mg  bid x 2 doses on 1/09 then was stopped the next day due to rising creatinine - LE edema today is 1+, not severe       Myer Fret  MD [facility] 06/03/2024, 7:47 PM  Recent Labs  Lab 06/02/24 0205 06/03/24 0237  CREATININE 2.26* 2.42*  K 3.9 4.1   Inpatient medications:  carvedilol   25 mg Oral BID WC   heparin   5,000 Units Subcutaneous Q8H   insulin  aspart  0-5 Units Subcutaneous QHS   insulin  aspart  0-9 Units Subcutaneous TID WC   insulin  aspart protamine- aspart  25 Units Subcutaneous BID WC   pantoprazole   40 mg Oral Daily   sodium chloride  flush  3 mL Intravenous Q12H    acetaminophen , labetalol , nitroGLYCERIN , ondansetron  (ZOFRAN ) IV, mouth rinse, sodium chloride  flush      [1] No Known Allergies

## 2024-06-03 NOTE — Plan of Care (Signed)
  Problem: Education: Goal: Knowledge of General Education information will improve Description: Including pain rating scale, medication(s)/side effects and non-pharmacologic comfort measures Outcome: Progressing   Problem: Clinical Measurements: Goal: Diagnostic test results will improve Outcome: Progressing   Problem: Nutrition: Goal: Adequate nutrition will be maintained Outcome: Progressing   Problem: Coping: Goal: Level of anxiety will decrease Outcome: Progressing   Problem: Safety: Goal: Ability to remain free from injury will improve Outcome: Progressing

## 2024-06-04 ENCOUNTER — Inpatient Hospital Stay (HOSPITAL_COMMUNITY)

## 2024-06-04 DIAGNOSIS — I509 Heart failure, unspecified: Secondary | ICD-10-CM | POA: Diagnosis not present

## 2024-06-04 LAB — BASIC METABOLIC PANEL WITH GFR
Anion gap: 7 (ref 5–15)
BUN: 40 mg/dL — ABNORMAL HIGH (ref 6–20)
CO2: 25 mmol/L (ref 22–32)
Calcium: 8.1 mg/dL — ABNORMAL LOW (ref 8.9–10.3)
Chloride: 106 mmol/L (ref 98–111)
Creatinine, Ser: 2.8 mg/dL — ABNORMAL HIGH (ref 0.61–1.24)
GFR, Estimated: 29 mL/min — ABNORMAL LOW
Glucose, Bld: 152 mg/dL — ABNORMAL HIGH (ref 70–99)
Potassium: 4.4 mmol/L (ref 3.5–5.1)
Sodium: 138 mmol/L (ref 135–145)

## 2024-06-04 LAB — GLUCOSE, CAPILLARY
Glucose-Capillary: 101 mg/dL — ABNORMAL HIGH (ref 70–99)
Glucose-Capillary: 110 mg/dL — ABNORMAL HIGH (ref 70–99)
Glucose-Capillary: 172 mg/dL — ABNORMAL HIGH (ref 70–99)
Glucose-Capillary: 244 mg/dL — ABNORMAL HIGH (ref 70–99)
Glucose-Capillary: 338 mg/dL — ABNORMAL HIGH (ref 70–99)
Glucose-Capillary: 59 mg/dL — ABNORMAL LOW (ref 70–99)
Glucose-Capillary: 73 mg/dL (ref 70–99)
Glucose-Capillary: 75 mg/dL (ref 70–99)
Glucose-Capillary: 89 mg/dL (ref 70–99)

## 2024-06-04 MED ORDER — HYDRALAZINE HCL 25 MG PO TABS
25.0000 mg | ORAL_TABLET | Freq: Three times a day (TID) | ORAL | Status: DC
  Administered 2024-06-04: 25 mg via ORAL
  Filled 2024-06-04: qty 1

## 2024-06-04 MED ORDER — HYDRALAZINE HCL 50 MG PO TABS
50.0000 mg | ORAL_TABLET | Freq: Three times a day (TID) | ORAL | Status: DC
  Administered 2024-06-04 – 2024-06-06 (×6): 50 mg via ORAL
  Filled 2024-06-04 (×6): qty 1

## 2024-06-04 NOTE — Progress Notes (Signed)
 Pt requested blood glucose check because felt funny. Checked by staff, CBG-73. Pt reports notification alarm from dexcom alerting him to low blood gluose. Pt given juice and half a sandwich. CBG-110 upon recheck. Pt reports feeling much better.

## 2024-06-04 NOTE — Progress Notes (Signed)
 " PROGRESS NOTE  JASIM HARARI  FMW:[id number] DOB: 1985-08-20 DOA: 05/31/2024 PCP: Pcp, No   Brief Narrative: Patient is a 39 year old male with history of insulin -dependent type 1 diabetes, diabetic retinopathy with partial blindness, diabetic nephropathy, hypertension, autoimmune thyroiditis, hypothyroidism who presented with bilateral lower extremity swelling, shortness of breath which are progressive.  Also reported fatigue.  Lab work showed elevated proBNP, elevated troponin, elevated creatinine from baseline. He was hypertensive on presentation.  CT angiogram of the chest with PE did not show any PE but was suspicious for acute pancreatitis but lipase was normal. .  Cardiology consulted   Given IV Lasix .  Hospital course remarkable for worsening of kidney function, thought to be secondary to contrast-induced nephropathy..  Nephrology consulted and following  Assessment & Plan:  Principal Problem:   Acute CHF (congestive heart failure) (HCC) Active Problems:   Essential hypertension, benign   Impairment level: low vision of both eyes   Intellectual disability   Hypothyroidism, acquired, autoimmune   AKI (acute kidney injury)   Troponin I above reference range   Acute coronary syndrome (HCC)   Noncompliance with treatment   Type 1 diabetes mellitus with hyperglycemia (HCC)   LE edema: Elevated proBNP , lower extremity edema.  Part of the edema was also contributed by low albumin.  Echo showed EF of 50 to 55%, no wall motion abnormality, normal right ventricle function.  Cardiology were following.  Remains on room air.Lasix  stopped due to AKI.  Still has bilateral lower extremity edema but edema looks like nonpitting.  Could have been contributed by low albumin.  Elevated troponin/type II NSTEMI: Likely from demand ischemia.  Hypertensive, also with AKI in presentation.  Echo did not show any wall motion.  No chest pain.  Cardiology recommending outpatient CTA versus exercise  nuclear study  Uncontrolled diabetes type 1: Recent A1c of 13.6.  Continue monitoring blood sugars.  Continue current insulin  regimen.  Diabetic coordinator consulted.  Follows with endocrinologist.  Diabetic coordinator following.  Severe hypertension: Blood pressure remains up.  Increase the dose of Coreg , start hydralazine .  May not be a good candidate for amlodipine due to lower extremity edema.  Not a candidate for ACE/ARB due to renal dysfunction  AKI on CKD stage IIIb: Baseline creatinine around 1.9.   Lasix  stopped.  Will do renal ultrasound.  Creatinine trended up to the range of 2.8 today.  Nephrology consulted.  Likely from contrast-induced nephropathy.  He needs regular outpatient nephrology follow-up  History of hyperlipidemia: On Lipitor  Abnormal UQU:Dophyuob elevated TSH. Normal free T4  Suspected pancreatitis: As per CT imaging,itshowed features of acute pancreatitis,reactive duodenitis or peptic ulcer disease.  Patient does not have any signs of pancreatitis.  Lipase normal.  Continue PPI.        DVT prophylaxis:Place TED hose Start: 06/02/24 0914 heparin  injection 5,000 Units Start: 05/31/24 2345     Code Status: Full Code  Family Communication: Family at bedside  Patient status: Inpatient  Patient is from : Home  Anticipated discharge to: Home  Estimated DC date: After improvement in the kidney function   Consultants: Cardiology  Procedures: None  Antimicrobials:  Anti-infectives (From admission, onward)    None       Subjective: Patient seen and examined at bedside today.  Hemodynamically stable.  Hypertensive this morning.  Remains on room air.  Denies any new complaints.  No shortness of breath or cough.   Objective: Vitals:   06/04/24 0103 06/04/24 0406 06/04/24 0500 06/04/24 9258  BP: (!) 155/99 (!) 149/96  (!) 164/115  Pulse: 71 74  64  Resp: 18 16  17   Temp: 98.1 F (36.7 C) 97.6 F (36.4 C)  98.3 F (36.8 C)  TempSrc: Oral Oral   Oral  SpO2: 97% 100%  98%  Weight:   62.8 kg   Height:        Intake/Output Summary (Last 24 hours) at 06/04/2024 1106 Last data filed at 06/04/2024 0900 Gross per 24 hour  Intake 480 ml  Output 1575 ml  Net -1095 ml   Filed Weights   06/02/24 0223 06/03/24 0441 06/04/24 0500  Weight: 64.7 kg 62.4 kg 62.8 kg    Examination:   General exam: Overall comfortable, not in distress HEENT:visual impairment Respiratory system:  no wheezes or crackles  Cardiovascular system: S1 & S2 heard, RRR.  Gastrointestinal system: Abdomen is nondistended, soft and nontender. Central nervous system: Alert and oriented Extremities:Lower extremity pitting edema, no clubbing ,no cyanosis Skin: No rashes, no ulcers,no icterus     Data Reviewed: I have personally reviewed following labs and imaging studies  CBC: Recent Labs  Lab 05/31/24 1451 05/31/24 1506 05/31/24 2347  WBC 4.9  --  5.2  NEUTROABS 3.0  --   --   HGB 12.8* 12.6* 11.5*  HCT 38.0* 37.0* 34.2*  MCV 92.0  --  92.4  PLT 290  --  263   Basic Metabolic Panel: Recent Labs  Lab 05/31/24 1451 05/31/24 1506 06/01/24 0103 06/02/24 0205 06/03/24 0237 06/04/24 0159  NA 135 133* 136 135 138 138  K 5.1 4.8 3.6 3.9 4.1 4.4  CL 102  --  106 103 106 106  CO2 25  --  23 25 25 25   GLUCOSE 296*  --  82 104* 148* 152*  BUN 39*  --  32* 37* 41* 40*  CREATININE 2.07*  --  1.77* 2.26* 2.42* 2.80*  CALCIUM  9.1  --  8.0* 8.4* 8.5* 8.1*     No results found for this or any previous visit (from the past 240 hours).   Radiology Studies: No results found.   Scheduled Meds:  carvedilol   25 mg Oral BID WC   heparin   5,000 Units Subcutaneous Q8H   hydrALAZINE   50 mg Oral Q8H   insulin  aspart  0-5 Units Subcutaneous QHS   insulin  aspart  0-9 Units Subcutaneous TID WC   insulin  aspart protamine- aspart  25 Units Subcutaneous BID WC   pantoprazole   40 mg Oral Daily   sodium chloride  flush  3 mL Intravenous Q12H   Continuous  Infusions:     LOS: 4 days   Ivonne Mustache, MD [facility] P1/04/2025, 11:06 AM  "

## 2024-06-04 NOTE — Progress Notes (Signed)
 "  Progress Note  Patient Name: [name] Date of Encounter: 06/04/2024  Primary Cardiologist: Deatrice Cage, MD   Subjective   Doing well without any complaints. Tried calling his mom but she didn't answer.   Inpatient Medications    Scheduled Meds:  carvedilol   25 mg Oral BID WC   heparin   5,000 Units Subcutaneous Q8H   hydrALAZINE   25 mg Oral Q8H   influenza vac split trivalent PF  0.5 mL Intramuscular Tomorrow-1000   insulin  aspart  0-5 Units Subcutaneous QHS   insulin  aspart  0-9 Units Subcutaneous TID WC   insulin  aspart protamine- aspart  25 Units Subcutaneous BID WC   pantoprazole   40 mg Oral Daily   sodium chloride  flush  3 mL Intravenous Q12H   Continuous Infusions:  PRN Meds: acetaminophen , labetalol , nitroGLYCERIN , ondansetron  (ZOFRAN ) IV, mouth rinse, sodium chloride  flush   Vital Signs    Vitals:   06/04/24 0103 06/04/24 0406 06/04/24 0500 06/04/24 0741  BP: (!) 155/99 (!) 149/96  (!) 164/115  Pulse: 71 74  64  Resp: 18 16  17   Temp: 98.1 F (36.7 C) 97.6 F (36.4 C)  98.3 F (36.8 C)  TempSrc: Oral Oral  Oral  SpO2: 97% 100%  98%  Weight:   62.8 kg   Height:        Intake/Output Summary (Last 24 hours) at 06/04/2024 0829 Last data filed at 06/04/2024 0744 Gross per 24 hour  Intake 600 ml  Output 2375 ml  Net -1775 ml   Filed Weights   06/02/24 0223 06/03/24 0441 06/04/24 0500  Weight: 64.7 kg 62.4 kg 62.8 kg    Telemetry    NSR with PVCs - Personally Reviewed  ECG    NSR - Personally Reviewed  Physical Exam   Physical Exam Vitals and nursing note reviewed.  Constitutional:      Appearance: Normal appearance.  HENT:     Head: Normocephalic and atraumatic.  Eyes:     Conjunctiva/sclera: Conjunctivae normal.  Cardiovascular:     Rate and Rhythm: Normal rate and regular rhythm.     Comments: Trace lower extremity pitting edema Pulmonary:     Effort: Pulmonary effort is normal.     Breath sounds: Normal breath  sounds.  Musculoskeletal:        General: No swelling or tenderness.     Right lower leg: Pitting Edema present.     Left lower leg: Pitting Edema present.  Skin:    Coloration: Skin is not jaundiced or pale.  Neurological:     Mental Status: He is alert.      Labs    Chemistry Recent Labs  Lab 05/31/24 1451 05/31/24 1506 06/02/24 0205 06/03/24 0237 06/04/24 0159  NA 135   < > 135 138 138  K 5.1   < > 3.9 4.1 4.4  CL 102   < > 103 106 106  CO2 25   < > 25 25 25   GLUCOSE 296*   < > 104* 148* 152*  BUN 39*   < > 37* 41* 40*  CREATININE 2.07*   < > 2.26* 2.42* 2.80*  CALCIUM  9.1   < > 8.4* 8.5* 8.1*  PROT 5.7*  --   --   --   --   ALBUMIN 2.8*  --   --   --   --   AST 25  --   --   --   --   ALT 25  --   --   --   --  ALKPHOS 119  --   --   --   --   BILITOT 0.2  --   --   --   --   GFRNONAA 42*   < > 38* 35* 29*  ANIONGAP 8   < > 6 7 7    < > = values in this interval not displayed.     Hematology Recent Labs  Lab 05/31/24 1451 05/31/24 1506 05/31/24 2347  WBC 4.9  --  5.2  RBC 4.13*  --  3.70*  HGB 12.8* 12.6* 11.5*  HCT 38.0* 37.0* 34.2*  MCV 92.0  --  92.4  MCH 31.0  --  31.1  MCHC 33.7  --  33.6  RDW 12.4  --  12.3  PLT 290  --  263    Cardiac EnzymesNo results for input(s): TROPONINI in the last 168 hours. No results for input(s): TROPIPOC in the last 168 hours.   BNP Recent Labs  Lab 05/31/24 1451  PROBNP 611.0*     DDimer No results for input(s): DDIMER in the last 168 hours.   Radiology    No results found.  Cardiac Studies   Echo 06/01/2024:  1. Left ventricular ejection fraction, by estimation, is 50 to 55%. Left  ventricular ejection fraction by 3D volume is 50 %. The left ventricle has  low normal function. The left ventricle has no regional wall motion  abnormalities. Indeterminate  diastolic filling due to E-A fusion.   2. Right ventricular systolic function is normal. The right ventricular  size is normal. Tricuspid  regurgitation signal is inadequate for assessing  PA pressure.   3. The mitral valve is normal in structure. No evidence of mitral valve  regurgitation. No evidence of mitral stenosis.   4. The aortic valve is tricuspid. Aortic valve regurgitation is not  visualized. No aortic stenosis is present.   5. The inferior vena cava is normal in size with greater than 50%  respiratory variability, suggesting right atrial pressure of 3 mmHg.   Patient Profile     39 y.o. male with a history of type 1 diabetes, mild cognitive delay, autonomic and peripheral neuropathy, hypertension, hypothyroidism, retinitis pigmentosa, he was seen for elevated troponins and hypertensive emergency  Assessment & Plan   Elevated troponin in the setting of hyperglycemia and hypertensive emergency, likely demand ischemia- chest pain free. echo 1/9: EF 50 to 55% with indeterminate diastology.  Troponin 114 -> 68 -> 93 -> 105 . Can consider coronary CTA if renal function improves vs. Exercise nuclear study if no improvement - can be done outpatient. Hypertensive emergency-  BP 211/139 on admission.  Now on carvedilol  25 mg twice daily and hydralazine  25 mg three times daily. Can consider adding on amlodipine in the setting of renal dysfunction vs. ACE/ARB when renal function improves.  AKI on CKD 3a, suspected due to contrast-induced nephropathy-  creatinine trending up.  Nephrology on board Edema-  received Lasix  40 mg x 2 then stopped due to rising creatinine. Type 1 diabetes       For questions or updates, please contact [facility] Please consult [email or web address] for contact info under        Signed, Emeline Calender, DO 06/04/2024, 8:29 AM    "

## 2024-06-04 NOTE — Plan of Care (Signed)

## 2024-06-04 NOTE — Progress Notes (Signed)
 Heart Failure Navigator Progress Note  Assessed for Heart & Vascular TOC clinic readiness.  Patient does not meet criteria due to EF 50-55%, has a scheduled [facility] appointment on 06/21/2024. No HF TOC. .   Navigator will sign off at this time.   Stephane Haddock, BSN, Scientist, Clinical (histocompatibility And Immunogenetics) Only

## 2024-06-04 NOTE — Plan of Care (Signed)
   Problem: Coping: Goal: Ability to adjust to condition or change in health will improve Outcome: Progressing   Problem: Fluid Volume: Goal: Ability to maintain a balanced intake and output will improve Outcome: Progressing

## 2024-06-04 NOTE — Progress Notes (Signed)
 Admit: 05/31/2024 LOS: 4  37M with AKI on CKD3a admit with hyperglycemia, contrast exposure  Subjective:  Creatinine up to 2.8, BUN 40, K4.4 2 L urine output documented yesterday Mother joined by [employer], questions answered  01/11 0701 - 01/12 0700 In: 600 [P.O.:600] Out: 2000 [Urine:2000]  Filed Weights   06/02/24 0223 06/03/24 0441 06/04/24 0500  Weight: 64.7 kg 62.4 kg 62.8 kg    Scheduled Meds:  carvedilol   25 mg Oral BID WC   heparin   5,000 Units Subcutaneous Q8H   hydrALAZINE   50 mg Oral Q8H   insulin  aspart  0-5 Units Subcutaneous QHS   insulin  aspart  0-9 Units Subcutaneous TID WC   insulin  aspart protamine- aspart  25 Units Subcutaneous BID WC   pantoprazole   40 mg Oral Daily   sodium chloride  flush  3 mL Intravenous Q12H   Continuous Infusions: PRN Meds:.acetaminophen , labetalol , nitroGLYCERIN , ondansetron  (ZOFRAN ) IV, mouth rinse, sodium chloride  flush  Current labs: reviewed    Physical Exam:  Blood pressure (!) 150/93, pulse 86, temperature 97.7 F (36.5 C), temperature source Oral, resp. rate 18, height 5' 3 (1.6 m), weight 62.8 kg, SpO2 100%. NAD, lying flat in bed without respiratory distress Regular, normal S1 and S2 Clear bilaterally No peripheral edema  A AKI, nonoliguric on CKD 3 with component of contrast exposure contributing; baseline kidney disease likely from diabetic nephropathy Uncontrolled hypertension, medications being adjusted, improved from presentation; currently carvedilol , hydralazine . Uncontrolled DM1 with A1c of 13.6%  P Continue supportive care Hopeful for improvement in GFR over the next 24 to 48 hours Medication Issues; Preferred narcotic agents for pain control are hydromorphone, fentanyl, and methadone. Morphine should not be used.  Baclofen should be avoided Avoid oral sodium phosphate and magnesium citrate based laxatives / bowel preps    Bernardino Gasman MD 06/04/2024, 2:37 PM  Recent Labs  Lab 06/02/24 0205  06/03/24 0237 06/04/24 0159  NA 135 138 138  K 3.9 4.1 4.4  CL 103 106 106  CO2 25 25 25   GLUCOSE 104* 148* 152*  BUN 37* 41* 40*  CREATININE 2.26* 2.42* 2.80*  CALCIUM  8.4* 8.5* 8.1*   Recent Labs  Lab 05/31/24 1451 05/31/24 1506 05/31/24 2347  WBC 4.9  --  5.2  NEUTROABS 3.0  --   --   HGB 12.8* 12.6* 11.5*  HCT 38.0* 37.0* 34.2*  MCV 92.0  --  92.4  PLT 290  --  263

## 2024-06-05 DIAGNOSIS — I169 Hypertensive crisis, unspecified: Secondary | ICD-10-CM

## 2024-06-05 LAB — GLUCOSE, CAPILLARY
Glucose-Capillary: 192 mg/dL — ABNORMAL HIGH (ref 70–99)
Glucose-Capillary: 222 mg/dL — ABNORMAL HIGH (ref 70–99)
Glucose-Capillary: 243 mg/dL — ABNORMAL HIGH (ref 70–99)
Glucose-Capillary: 52 mg/dL — ABNORMAL LOW (ref 70–99)
Glucose-Capillary: 82 mg/dL (ref 70–99)
Glucose-Capillary: 92 mg/dL (ref 70–99)

## 2024-06-05 LAB — BASIC METABOLIC PANEL WITH GFR
Anion gap: 7 (ref 5–15)
BUN: 40 mg/dL — ABNORMAL HIGH (ref 6–20)
CO2: 24 mmol/L (ref 22–32)
Calcium: 8.2 mg/dL — ABNORMAL LOW (ref 8.9–10.3)
Chloride: 107 mmol/L (ref 98–111)
Creatinine, Ser: 2.64 mg/dL — ABNORMAL HIGH (ref 0.61–1.24)
GFR, Estimated: 31 mL/min — ABNORMAL LOW
Glucose, Bld: 127 mg/dL — ABNORMAL HIGH (ref 70–99)
Potassium: 4.4 mmol/L (ref 3.5–5.1)
Sodium: 137 mmol/L (ref 135–145)

## 2024-06-05 NOTE — Progress Notes (Signed)
 " PROGRESS NOTE  [name]  FMW:[id number] DOB: [birth year] DOA: 05/31/2024 PCP: Pcp, No   Brief Narrative: Patient is a 39 year old male with history of insulin -dependent type 1 diabetes, diabetic retinopathy with partial blindness, diabetic nephropathy, hypertension, autoimmune thyroiditis, hypothyroidism who presented with bilateral lower extremity swelling, shortness of breath which are progressive.  Also reported fatigue.  Lab work showed elevated proBNP, elevated troponin, elevated creatinine from baseline. He was hypertensive on presentation.  CT angiogram of the chest with PE did not show any PE but was suspicious for acute pancreatitis but lipase was normal. .  Cardiology consulted   Given IV Lasix .  Hospital course remarkable for worsening of kidney function, thought to be secondary to contrast-induced nephropathy..  Nephrology consulted and following.  Now kidney function improving.  Possible discharge home tomorrow anticipating further improvement in the kidney function  Assessment & Plan:  Principal Problem:   Acute CHF (congestive heart failure) (HCC) Active Problems:   Essential hypertension, benign   Impairment level: low vision of both eyes   Intellectual disability   Hypothyroidism, acquired, autoimmune   AKI (acute kidney injury)   Troponin I above reference range   Acute coronary syndrome (HCC)   Noncompliance with treatment   Type 1 diabetes mellitus with hyperglycemia (HCC)   LE edema: Elevated proBNP , lower extremity edema.  Part of the edema was also contributed by low albumin.  Echo showed EF of 50 to 55%, no wall motion abnormality, normal right ventricle function.  Cardiology were following.  Remains on room air.Lasix  stopped due to AKI.  Still has bilateral lower extremity edema but edema looks like nonpitting.  Could have been contributed by low albumin.  Elevated troponin/type II NSTEMI: Likely from demand ischemia.  Hypertensive, also with AKI in  presentation.  Echo did not show any wall motion.  No chest pain.  Cardiology recommending outpatient CTA versus exercise nuclear study  Uncontrolled diabetes type 1: Recent A1c of 13.6.  Continue monitoring blood sugars.  Continue current insulin  regimen.  Diabetic coordinator consulted.  Follows with endocrinologist.  Diabetic coordinator following.  Severe hypertension: Blood pressure remains up.  Increased the dose of Coreg , start hydralazine .  May not be a good candidate for amlodipine due to lower extremity edema.  Not a candidate for ACE/ARB due to renal dysfunction.  Blood function better today  AKI on CKD stage IIIb: Baseline creatinine around 1.9.   Lasix  stopped.    Nephrology consulted.  Likely from contrast-induced nephropathy.  Renal ultrasound showed mild bilateral hydronephrosis of unclear etiology.  Patient voiding without any problem.  He needs regular outpatient nephrology follow-up.  Creatinine trending down today to the range of 2.6.  Check BMP tomorrow  History of hyperlipidemia: On Lipitor  Abnormal UQU:Dophyuob elevated TSH. Normal free T4  Suspected pancreatitis: As per CT imaging,itshowed features of acute pancreatitis,reactive duodenitis or peptic ulcer disease.  Patient does not have any signs of pancreatitis.  Lipase normal.  Continue PPI.        DVT prophylaxis:Place TED hose Start: 06/02/24 0914 heparin  injection 5,000 Units Start: 05/31/24 2345     Code Status: Full Code  Family Communication: Family at bedside.  Discussed with mother on phone on 1/13  Patient status: Inpatient  Patient is from : Home  Anticipated discharge to: Home  Estimated DC date: Likely tomorrow   Consultants: Cardiology  Procedures: None  Antimicrobials:  Anti-infectives (From admission, onward)    None       Subjective: Patient seen  and examined at bedside today.  Hemodynamically stable.  Overall comfortable.  Lying in bed.  No new complaints today.  Voiding  well.  Lower extremity edema better.  Blood pressure better today   Objective: Vitals:   06/05/24 0029 06/05/24 0338 06/05/24 0627 06/05/24 0729  BP: 139/89 123/83 123/83 (!) 146/95  Pulse: 80 71  87  Resp: 17 16  20   Temp: 97.6 F (36.4 C) (!) 97.4 F (36.3 C)  97.7 F (36.5 C)  TempSrc: Oral Oral  Oral  SpO2: 100% 99%  99%  Weight:  62.9 kg    Height:        Intake/Output Summary (Last 24 hours) at 06/05/2024 1115 Last data filed at 06/05/2024 1004 Gross per 24 hour  Intake 477 ml  Output 1575 ml  Net -1098 ml   Filed Weights   06/03/24 0441 06/04/24 0500 06/05/24 0338  Weight: 62.4 kg 62.8 kg 62.9 kg    Examination:   General exam: Overall comfortable, not in distress HEENT: visual impairment Respiratory system:  no wheezes or crackles  Cardiovascular system: S1 & S2 heard, RRR.  Gastrointestinal system: Abdomen is nondistended, soft and nontender. Central nervous system: Alert and oriented Extremities: trace bilateral lower extremity edema, no clubbing ,no cyanosis Skin: No rashes, no ulcers,no icterus     Data Reviewed: I have personally reviewed following labs and imaging studies  CBC: Recent Labs  Lab 05/31/24 1451 05/31/24 1506 05/31/24 2347  WBC 4.9  --  5.2  NEUTROABS 3.0  --   --   HGB 12.8* 12.6* 11.5*  HCT 38.0* 37.0* 34.2*  MCV 92.0  --  92.4  PLT 290  --  263   Basic Metabolic Panel: Recent Labs  Lab 06/01/24 0103 06/02/24 0205 06/03/24 0237 06/04/24 0159 06/05/24 0211  NA 136 135 138 138 137  K 3.6 3.9 4.1 4.4 4.4  CL 106 103 106 106 107  CO2 23 25 25 25 24   GLUCOSE 82 104* 148* 152* 127*  BUN 32* 37* 41* 40* 40*  CREATININE 1.77* 2.26* 2.42* 2.80* 2.64*  CALCIUM  8.0* 8.4* 8.5* 8.1* 8.2*     No results found for this or any previous visit (from the past 240 hours).   Radiology Studies: US  RENAL Result Date: 06/04/2024 CLINICAL DATA:  Acute renal injury EXAM: RENAL / URINARY TRACT ULTRASOUND COMPLETE COMPARISON:  None  Available. FINDINGS: Right Kidney: Renal measurements: 10.8 x 6.1 x 5.6 cm = volume: 192.7 mL. Normal renal cortical echotexture. Mild hydronephrosis. No nephrolithiasis. 1 cm simple cyst upper pole right kidney. Left Kidney: Renal measurements: 11.6 x 6.2 x 6.3 cm = volume: 230.2 mL. Normal renal cortical echotexture. Mild hydronephrosis. No nephrolithiasis or renal mass. Bladder: Appears normal for degree of bladder distention. Other: None. IMPRESSION: 1. Mild bilateral hydronephrosis of uncertain etiology. 2. Otherwise unremarkable exam. Electronically Signed   By: Ozell Daring M.D.   On: 06/04/2024 23:40     Scheduled Meds:  carvedilol   25 mg Oral BID WC   heparin   5,000 Units Subcutaneous Q8H   hydrALAZINE   50 mg Oral Q8H   insulin  aspart  0-5 Units Subcutaneous QHS   insulin  aspart  0-9 Units Subcutaneous TID WC   insulin  aspart protamine- aspart  25 Units Subcutaneous BID WC   pantoprazole   40 mg Oral Daily   sodium chloride  flush  3 mL Intravenous Q12H   Continuous Infusions:     LOS: 5 days   Ivonne Mustache, MD [facility] P1/13/2026,  11:15 AM  "

## 2024-06-05 NOTE — Plan of Care (Signed)
  Problem: Health Behavior/Discharge Planning: Goal: Ability to manage health-related needs will improve Outcome: Progressing   Problem: Metabolic: Goal: Ability to maintain appropriate glucose levels will improve Outcome: Progressing   Problem: Nutritional: Goal: Maintenance of adequate nutrition will improve Outcome: Progressing   Problem: Tissue Perfusion: Goal: Adequacy of tissue perfusion will improve Outcome: Progressing

## 2024-06-05 NOTE — Progress Notes (Signed)
 Admit: 05/31/2024 LOS: 5  27M with AKI on CKD3a admit with hyperglycemia, contrast exposure  Subjective:  Creatinine down to 2.64, BUN 40, K4.4 1.5 L urine output documented yesterday I called mother and updated about status today BPs much improved  01/12 0701 - 01/13 0700 In: 357 [P.O.:357] Out: 1525 [Urine:1525]  Filed Weights   06/03/24 0441 06/04/24 0500 06/05/24 0338  Weight: 62.4 kg 62.8 kg 62.9 kg    Scheduled Meds:  carvedilol   25 mg Oral BID WC   heparin   5,000 Units Subcutaneous Q8H   hydrALAZINE   50 mg Oral Q8H   insulin  aspart  0-5 Units Subcutaneous QHS   insulin  aspart  0-9 Units Subcutaneous TID WC   insulin  aspart protamine- aspart  25 Units Subcutaneous BID WC   pantoprazole   40 mg Oral Daily   sodium chloride  flush  3 mL Intravenous Q12H   Continuous Infusions: PRN Meds:.acetaminophen , labetalol , nitroGLYCERIN , ondansetron  (ZOFRAN ) IV, mouth rinse, sodium chloride  flush  Current labs: reviewed    Physical Exam:  Blood pressure (!) 153/97, pulse 99, temperature 98.9 F (37.2 C), temperature source Oral, resp. rate 20, height 5' 3 (1.6 m), weight 62.9 kg, SpO2 99%. NAD, lying flat in bed without respiratory distress Regular, normal S1 and S2 Clear bilaterally No peripheral edema  A AKI, nonoliguric on CKD 3 with component of contrast exposure contributing; baseline kidney disease likely from diabetic nephropathy Uncontrolled hypertension, medications being adjusted, improved from presentation; currently carvedilol , hydralazine . Uncontrolled DM1 with A1c of 13.6%  P Continue supportive care If creatinine stable to improved tomorrow then can discharge with close follow-up at [facility], I am going to start making arrangements. Medication Issues; Preferred narcotic agents for pain control are hydromorphone, fentanyl, and methadone. Morphine should not be used.  Baclofen should be avoided Avoid oral sodium phosphate and magnesium citrate based  laxatives / bowel preps    Bernardino Gasman MD 06/05/2024, 12:55 PM  Recent Labs  Lab 06/03/24 0237 06/04/24 0159 06/05/24 0211  NA 138 138 137  K 4.1 4.4 4.4  CL 106 106 107  CO2 25 25 24   GLUCOSE 148* 152* 127*  BUN 41* 40* 40*  CREATININE 2.42* 2.80* 2.64*  CALCIUM  8.5* 8.1* 8.2*   Recent Labs  Lab 05/31/24 1451 05/31/24 1506 05/31/24 2347  WBC 4.9  --  5.2  NEUTROABS 3.0  --   --   HGB 12.8* 12.6* 11.5*  HCT 38.0* 37.0* 34.2*  MCV 92.0  --  92.4  PLT 290  --  263

## 2024-06-05 NOTE — Progress Notes (Signed)
 "  Progress Note  Patient Name: [name] Date of Encounter: 06/05/2024  Primary Cardiologist: Deatrice Cage, MD   Subjective   Doing well, no complaints.   Inpatient Medications    Scheduled Meds:  carvedilol   25 mg Oral BID WC   heparin   5,000 Units Subcutaneous Q8H   hydrALAZINE   50 mg Oral Q8H   insulin  aspart  0-5 Units Subcutaneous QHS   insulin  aspart  0-9 Units Subcutaneous TID WC   insulin  aspart protamine- aspart  25 Units Subcutaneous BID WC   pantoprazole   40 mg Oral Daily   sodium chloride  flush  3 mL Intravenous Q12H   Continuous Infusions:  PRN Meds: acetaminophen , labetalol , nitroGLYCERIN , ondansetron  (ZOFRAN ) IV, mouth rinse, sodium chloride  flush   Vital Signs    Vitals:   06/04/24 2132 06/05/24 0029 06/05/24 0338 06/05/24 0627  BP: 128/81 139/89 123/83 123/83  Pulse:  80 71   Resp:  17 16   Temp:  97.6 F (36.4 C) (!) 97.4 F (36.3 C)   TempSrc:  Oral Oral   SpO2:  100% 99%   Weight:   62.9 kg   Height:        Intake/Output Summary (Last 24 hours) at 06/05/2024 0728 Last data filed at 06/05/2024 0631 Gross per 24 hour  Intake 357 ml  Output 1525 ml  Net -1168 ml   Filed Weights   06/03/24 0441 06/04/24 0500 06/05/24 0338  Weight: 62.4 kg 62.8 kg 62.9 kg    Telemetry    NSR with PVCs - Personally Reviewed  ECG    NSR - Personally Reviewed  Physical Exam   Physical Exam Vitals and nursing note reviewed.  Constitutional:      Appearance: Normal appearance.  HENT:     Head: Normocephalic and atraumatic.  Eyes:     Conjunctiva/sclera: Conjunctivae normal.  Cardiovascular:     Rate and Rhythm: Normal rate and regular rhythm.  Pulmonary:     Effort: Pulmonary effort is normal.     Breath sounds: Normal breath sounds.  Musculoskeletal:        General: No swelling or tenderness.  Skin:    Coloration: Skin is not jaundiced or pale.  Neurological:     Mental Status: He is alert.      Labs     Chemistry Recent Labs  Lab 05/31/24 1451 05/31/24 1506 06/03/24 0237 06/04/24 0159 06/05/24 0211  NA 135   < > 138 138 137  K 5.1   < > 4.1 4.4 4.4  CL 102   < > 106 106 107  CO2 25   < > 25 25 24   GLUCOSE 296*   < > 148* 152* 127*  BUN 39*   < > 41* 40* 40*  CREATININE 2.07*   < > 2.42* 2.80* 2.64*  CALCIUM  9.1   < > 8.5* 8.1* 8.2*  PROT 5.7*  --   --   --   --   ALBUMIN 2.8*  --   --   --   --   AST 25  --   --   --   --   ALT 25  --   --   --   --   ALKPHOS 119  --   --   --   --   BILITOT 0.2  --   --   --   --   GFRNONAA 42*   < > 35* 29* 31*  ANIONGAP 8   < >  7 7 7    < > = values in this interval not displayed.     Hematology Recent Labs  Lab 05/31/24 1451 05/31/24 1506 05/31/24 2347  WBC 4.9  --  5.2  RBC 4.13*  --  3.70*  HGB 12.8* 12.6* 11.5*  HCT 38.0* 37.0* 34.2*  MCV 92.0  --  92.4  MCH 31.0  --  31.1  MCHC 33.7  --  33.6  RDW 12.4  --  12.3  PLT 290  --  263    Cardiac EnzymesNo results for input(s): TROPONINI in the last 168 hours. No results for input(s): TROPIPOC in the last 168 hours.   BNP Recent Labs  Lab 05/31/24 1451  PROBNP 611.0*     DDimer No results for input(s): DDIMER in the last 168 hours.   Radiology    US  RENAL Result Date: 06/04/2024 CLINICAL DATA:  Acute renal injury EXAM: RENAL / URINARY TRACT ULTRASOUND COMPLETE COMPARISON:  None Available. FINDINGS: Right Kidney: Renal measurements: 10.8 x 6.1 x 5.6 cm = volume: 192.7 mL. Normal renal cortical echotexture. Mild hydronephrosis. No nephrolithiasis. 1 cm simple cyst upper pole right kidney. Left Kidney: Renal measurements: 11.6 x 6.2 x 6.3 cm = volume: 230.2 mL. Normal renal cortical echotexture. Mild hydronephrosis. No nephrolithiasis or renal mass. Bladder: Appears normal for degree of bladder distention. Other: None. IMPRESSION: 1. Mild bilateral hydronephrosis of uncertain etiology. 2. Otherwise unremarkable exam. Electronically Signed   By: Ozell Daring M.D.    On: 06/04/2024 23:40    Cardiac Studies   Echo 06/01/2024:  1. Left ventricular ejection fraction, by estimation, is 50 to 55%. Left  ventricular ejection fraction by 3D volume is 50 %. The left ventricle has  low normal function. The left ventricle has no regional wall motion  abnormalities. Indeterminate  diastolic filling due to E-A fusion.   2. Right ventricular systolic function is normal. The right ventricular  size is normal. Tricuspid regurgitation signal is inadequate for assessing  PA pressure.   3. The mitral valve is normal in structure. No evidence of mitral valve  regurgitation. No evidence of mitral stenosis.   4. The aortic valve is tricuspid. Aortic valve regurgitation is not  visualized. No aortic stenosis is present.   5. The inferior vena cava is normal in size with greater than 50%  respiratory variability, suggesting right atrial pressure of 3 mmHg.   Patient Profile     39 y.o. male with a history of type 1 diabetes, mild cognitive delay, autonomic and peripheral neuropathy, hypertension, hypothyroidism, retinitis pigmentosa, he was seen for elevated troponins and hypertensive emergency  Assessment & Plan   Elevated troponin in the setting of hyperglycemia and hypertensive emergency, likely demand ischemia- chest pain free. echo 1/9: EF 50 to 55% with indeterminate diastology.  Troponin 114 -> 68 -> 93 -> 105 . Can consider coronary CTA if renal function improves vs. Exercise nuclear study if no renal improvement - can be done outpatient. Hypertensive emergency-  BP 211/139 on admission.  Now on carvedilol  25 mg twice daily and hydralazine  50 mg three times daily.  AKI on CKD 3a, suspected due to contrast-induced nephropathy, improving- Nephrology on board Edema-  received Lasix  40 mg x 2 then stopped due to rising creatinine. Type 1 diabetes    No further recommendations, cardiology will sign off.  Has follow up on 06/21/24 with Bernardino Bring, PA to decide on  coronary CTA vs. Exercise nuclear study, pending renal function  For questions  or updates, please contact [facility] Please consult [email or web address] for contact info under        Signed, Emeline Calender, DO 06/05/2024, 7:28 AM    "

## 2024-06-06 ENCOUNTER — Other Ambulatory Visit (HOSPITAL_COMMUNITY): Payer: Self-pay

## 2024-06-06 LAB — BASIC METABOLIC PANEL WITH GFR
Anion gap: 5 (ref 5–15)
BUN: 44 mg/dL — ABNORMAL HIGH (ref 6–20)
CO2: 26 mmol/L (ref 22–32)
Calcium: 8.4 mg/dL — ABNORMAL LOW (ref 8.9–10.3)
Chloride: 108 mmol/L (ref 98–111)
Creatinine, Ser: 2.8 mg/dL — ABNORMAL HIGH (ref 0.61–1.24)
GFR, Estimated: 29 mL/min — ABNORMAL LOW
Glucose, Bld: 88 mg/dL (ref 70–99)
Potassium: 4.6 mmol/L (ref 3.5–5.1)
Sodium: 139 mmol/L (ref 135–145)

## 2024-06-06 LAB — GLUCOSE, CAPILLARY
Glucose-Capillary: 99 mg/dL (ref 70–99)
Glucose-Capillary: 274 mg/dL — ABNORMAL HIGH (ref 70–99)

## 2024-06-06 MED ORDER — PANTOPRAZOLE SODIUM 40 MG PO TBEC
40.0000 mg | DELAYED_RELEASE_TABLET | Freq: Every day | ORAL | 0 refills | Status: AC
  Filled 2024-06-06: qty 30, 30d supply, fill #0

## 2024-06-06 MED ORDER — OMRON 3 SERIES BP MONITOR DEVI
1.0000 | Freq: Every day | 0 refills | Status: AC
  Filled 2024-06-06: qty 1, 1d supply, fill #0

## 2024-06-06 MED ORDER — HYDRALAZINE HCL 50 MG PO TABS
50.0000 mg | ORAL_TABLET | Freq: Three times a day (TID) | ORAL | 0 refills | Status: AC
  Filled 2024-06-06: qty 180, 60d supply, fill #0

## 2024-06-06 MED ORDER — CARVEDILOL 25 MG PO TABS
25.0000 mg | ORAL_TABLET | Freq: Two times a day (BID) | ORAL | 0 refills | Status: AC
  Filled 2024-06-06: qty 120, 60d supply, fill #0

## 2024-06-06 NOTE — Discharge Summary (Signed)
 Physician Discharge Summary  [name] FMW:[id number] DOB: 07/06/1985 DOA: 05/31/2024  PCP: Pcp, No  Admit date: 05/31/2024 Discharge date: 06/06/2024  Admitted From: Home Disposition:  Home  Discharge Condition:Stable CODE STATUS:FULL Diet recommendation: Carb Modified  Brief/Interim Summary: Patient is a 39 year old male with history of insulin -dependent type 1 diabetes, diabetic retinopathy with partial blindness, diabetic nephropathy, hypertension, autoimmune thyroiditis, hypothyroidism who presented with bilateral lower extremity swelling, shortness of breath which are progressive.  Also reported fatigue.  Lab work showed elevated proBNP, elevated troponin, elevated creatinine from baseline. He was hypertensive on presentation.  CT angiogram of the chest with PE did not show any PE but was suspicious for acute pancreatitis but lipase was normal. .  Cardiology consulted   Given IV Lasix .  Hospital course remarkable for worsening of kidney function, thought to be secondary to contrast-induced nephropathy..  Nephrology consulted and following.  Creatinine has plateaued in the range of 2.  Nephrology cleared for discharge and will arrange outpatient follow-up.  Medically stable for discharge home today  Following problems were addressed during the hospitalization:  LE edema: Elevated proBNP , lower extremity edema.  Part of the edema was also contributed by low albumin.  Echo showed EF of 50 to 55%, no wall motion abnormality, normal right ventricle function.  Cardiology were following.  Remains on room air.Lasix  stopped due to AKI.  Still has bilateral lower extremity edema but edema looks like nonpitting.  Could have been contributed by low albumin.   Elevated troponin/type II NSTEMI: Likely from demand ischemia.  Hypertensive, also with AKI in presentation.  Echo did not show any wall motion.  No chest pain.  Cardiology recommending outpatient CTA versus exercise nuclear study    Uncontrolled diabetes type 1: Recent A1c of 13.6.  Continue monitoring blood sugars.  Continue current insulin  regimen.  Diabetic coordinator consulted.  Follows with endocrinologist.    Severe hypertension:   On Coreg , started hydralazine .  May not be a good candidate for amlodipine due to lower extremity edema.  Not a candidate for ACE/ARB due to renal dysfunction.     AKI on CKD stage IIIb: Baseline creatinine around 1.9.   Lasix  stopped.    Nephrology consulted.  Likely from contrast-induced nephropathy.  Renal ultrasound showed mild bilateral hydronephrosis of unclear etiology.  Patient voiding without any problem.  He needs regular outpatient nephrology follow-up.  Check BMP in a week   Abnormal UQU:Dophyuob elevated TSH. Normal free T4   Suspected pancreatitis: As per CT imaging,itshowed features of acute pancreatitis,reactive duodenitis or peptic ulcer disease.  Patient does not have any signs of pancreatitis.  Lipase normal.  Continue PPI.     Discharge Diagnoses:  Principal Problem:   Acute CHF (congestive heart failure) (HCC) Active Problems:   Essential hypertension, benign   Impairment level: low vision of both eyes   Intellectual disability   Hypothyroidism, acquired, autoimmune   AKI (acute kidney injury)   Troponin I above reference range   Acute coronary syndrome (HCC)   Noncompliance with treatment   Type 1 diabetes mellitus with hyperglycemia [facility])    Discharge Instructions  Discharge Instructions     Discharge instructions   Complete by: As directed    1)Please take your medications as instructed 2)Follow up PCP in a week. 3)Monitor your blood pressure and blood sugars at home.  Follow-up with your endocrinologist 4)You can be called by nephrology for follow-up appointment.   Increase activity slowly   Complete by: As directed  Allergies as of 06/06/2024   No Known Allergies      Medication List     TAKE these medications    Blood  Pressure Kit 1 each by Does not apply route daily.   carvedilol  25 MG tablet Commonly known as: COREG  Take 1 tablet (25 mg total) by mouth 2 (two) times daily with a meal.   hydrALAZINE  50 MG tablet Commonly known as: APRESOLINE  Take 1 tablet (50 mg total) by mouth every 8 (eight) hours.   NovoLOG  Mix 70/30 FlexPen (70-30) 100 UNIT/ML FlexPen Generic drug: insulin  aspart protamine - aspart Inject 24-34 Units into the skin 2 (two) times daily with a meal. 34 UNITS WITH BREAKFAST, 24 WITH SUPPER. MAX DAILY DOSE 55 UNITS   pantoprazole  40 MG tablet Commonly known as: PROTONIX  Take 1 tablet (40 mg total) by mouth daily. Start taking on: June 07, 2024        Allergies[1]  Consultations: Cardiology,nephrology   Procedures/Studies: US  RENAL Result Date: 06/04/2024 CLINICAL DATA:  Acute renal injury EXAM: RENAL / URINARY TRACT ULTRASOUND COMPLETE COMPARISON:  None Available. FINDINGS: Right Kidney: Renal measurements: 10.8 x 6.1 x 5.6 cm = volume: 192.7 mL. Normal renal cortical echotexture. Mild hydronephrosis. No nephrolithiasis. 1 cm simple cyst upper pole right kidney. Left Kidney: Renal measurements: 11.6 x 6.2 x 6.3 cm = volume: 230.2 mL. Normal renal cortical echotexture. Mild hydronephrosis. No nephrolithiasis or renal mass. Bladder: Appears normal for degree of bladder distention. Other: None. IMPRESSION: 1. Mild bilateral hydronephrosis of uncertain etiology. 2. Otherwise unremarkable exam. Electronically Signed   By: Ozell Daring M.D.   On: 06/04/2024 23:40   ECHOCARDIOGRAM COMPLETE Result Date: 06/01/2024    ECHOCARDIOGRAM REPORT   Patient Name:   [name] Date of Exam: 06/01/2024 Medical Rec #:  [id number]             Height:       62.0 in Accession #:    [id number]            Weight:       132.3 lb Date of Birth:  1985/12/25             BSA:          1.604 m Patient Age:    38 years              BP:           171/118 mmHg Patient Gender: M                      HR:           89 bpm. Exam Location:  Inpatient Procedure: 2D Echo, Cardiac Doppler, Color Doppler, Strain Analysis and 3D Echo            (Both Spectral and Color Flow Doppler were utilized during            procedure). Indications:    CHF- Acute Diastolic I50.31  History:        Patient has prior history of Echocardiogram examinations, most                 recent 11/06/2018. CHF; Risk Factors:Diabetes and Hypertension.  Sonographer:    Koleen Popper RDCS Referring Phys: EMERY LITTIE FUSS IMPRESSIONS  1. Left ventricular ejection fraction, by estimation, is 50 to 55%. Left ventricular ejection fraction by 3D volume is 50 %. The left ventricle has low normal function. The left ventricle has no  regional wall motion abnormalities. Indeterminate diastolic filling due to E-A fusion.  2. Right ventricular systolic function is normal. The right ventricular size is normal. Tricuspid regurgitation signal is inadequate for assessing PA pressure.  3. The mitral valve is normal in structure. No evidence of mitral valve regurgitation. No evidence of mitral stenosis.  4. The aortic valve is tricuspid. Aortic valve regurgitation is not visualized. No aortic stenosis is present.  5. The inferior vena cava is normal in size with greater than 50% respiratory variability, suggesting right atrial pressure of 3 mmHg. FINDINGS  Left Ventricle: Left ventricular ejection fraction, by estimation, is 50 to 55%. Left ventricular ejection fraction by 3D volume is 50 %. The left ventricle has low normal function. The left ventricle has no regional wall motion abnormalities. The left ventricular internal cavity size was normal in size. There is no left ventricular hypertrophy. Indeterminate diastolic filling due to E-A fusion. Right Ventricle: The right ventricular size is normal. No increase in right ventricular wall thickness. Right ventricular systolic function is normal. Tricuspid regurgitation signal is inadequate for assessing PA pressure.  Left Atrium: Left atrial size was normal in size. Right Atrium: Right atrial size was normal in size. Pericardium: There is no evidence of pericardial effusion. Mitral Valve: The mitral valve is normal in structure. No evidence of mitral valve regurgitation. No evidence of mitral valve stenosis. Tricuspid Valve: The tricuspid valve is normal in structure. Tricuspid valve regurgitation is not demonstrated. No evidence of tricuspid stenosis. Aortic Valve: The aortic valve is tricuspid. Aortic valve regurgitation is not visualized. No aortic stenosis is present. Pulmonic Valve: The pulmonic valve was normal in structure. Pulmonic valve regurgitation is not visualized. No evidence of pulmonic stenosis. Aorta: The aortic root is normal in size and structure. Venous: The inferior vena cava is normal in size with greater than 50% respiratory variability, suggesting right atrial pressure of 3 mmHg. IAS/Shunts: No atrial level shunt detected by color flow Doppler. Additional Comments: 3D was performed not requiring image post processing on an independent workstation and was normal.  LEFT VENTRICLE PLAX 2D LVIDd:         4.60 cm         Diastology LVIDs:         3.40 cm         LV e' medial:    5.00 cm/s LV PW:         0.80 cm         LV E/e' medial:  9.4 LV IVS:        0.80 cm         LV e' lateral:   6.96 cm/s LVOT diam:     1.90 cm         LV E/e' lateral: 6.8 LV SV:         33 LV SV Index:   21 LVOT Area:     2.84 cm        3D Volume EF                                LV 3D EF:    Left                                             ventricul LV Volumes (MOD)  ar LV vol d, MOD    121.0 ml                   ejection A4C:                                        fraction LV vol s, MOD    56.1 ml                    by 3D A4C:                                        volume is LV SV MOD A4C:   121.0 ml                   50 %.                                 3D Volume EF:                                3D EF:         50 %                                LV EDV:       144 ml                                LV ESV:       72 ml                                LV SV:        72 ml RIGHT VENTRICLE             IVC RV Basal diam:  3.10 cm     IVC diam: 1.30 cm RV S prime:     14.40 cm/s TAPSE (M-mode): 1.9 cm LEFT ATRIUM           Index        RIGHT ATRIUM           Index LA diam:      2.30 cm 1.43 cm/m   RA Area:     10.60 cm LA Vol (A4C): 19.4 ml 12.10 ml/m  RA Volume:   22.10 ml  13.78 ml/m  AORTIC VALVE LVOT Vmax:   66.90 cm/s LVOT Vmean:  44.500 cm/s LVOT VTI:    0.117 m  AORTA Ao Root diam: 3.00 cm Ao Asc diam:  3.20 cm MITRAL VALVE MV Area (PHT): 4.86 cm    SHUNTS MV Decel Time: 156 msec    Systemic VTI:  0.12 m MV E velocity: 47.10 cm/s  Systemic Diam: 1.90 cm MV A velocity: 58.50 cm/s MV E/A ratio:  0.81 Soyla Merck MD Electronically signed by Soyla Merck MD Signature Date/Time: 06/01/2024/12:59:45 PM    Final    CT Angio Chest PE W and/or Wo Contrast Result Date: 05/31/2024 EXAM: CTA of the Chest with  contrast for PE 05/31/2024 10:32:45 PM TECHNIQUE: CTA of the chest was performed after the administration of 75 mL of iohexol  (OMNIPAQUE ) 350 MG/ML injection. Multiplanar reformatted images are provided for review. MIP images are provided for review. Automated exposure control, iterative reconstruction, and/or weight based adjustment of the mA/kV was utilized to reduce the radiation dose to as low as reasonably achievable. COMPARISON: Comparison with chest radiograph 05/31/2024 and CT chest 11/05/2018. CLINICAL HISTORY: Pulmonary embolism (PE) suspected, high prob. FINDINGS: PULMONARY ARTERIES: Pulmonary arteries are adequately opacified for evaluation. No pulmonary embolism. Main pulmonary artery is normal in caliber. MEDIASTINUM: The heart and pericardium demonstrate no acute abnormality. Normal heart size. No pericardial effusions. There is no acute abnormality of the thoracic aorta. Normal caliber thoracic  aorta. The esophagus is decompressed. Thyroid  gland is normal. LYMPH NODES: Multiple axillary lymph nodes bilaterally without pathologic enlargement, likely reactive. No significant lymphadenopathy in the mediastinum. LUNGS AND PLEURA: Mild dependent atelectasis in the lung bases. No focal consolidation or pulmonary edema. No pleural effusion or pneumothorax. UPPER ABDOMEN: Stranding and edema around the pancreas, anterior pararenal space, and left pericolonic gutter. This is likely to represent acute pancreatitis. The duodenum demonstrates thick wall with stranding around the duodenum possibly representing reactive duodenitis or peptic ulcer disease. Multiple bilateral renal stones. The largest in the left mid pole measures 5 mm in maximal diameter. SOFT TISSUES AND BONES: No acute bone or soft tissue abnormality. No acute bony abnormalities. IMPRESSION: 1. No pulmonary embolism. 2. Acute pancreatitis. 3. Reactive duodenitis or peptic ulcer disease. Electronically signed by: Elsie Gravely MD 05/31/2024 10:39 PM EST RP Workstation: [id number]   DG Chest 2 View Result Date: 05/31/2024 CLINICAL DATA:  Shortness of breath. EXAM: CHEST - 2 VIEW COMPARISON:  11/05/2018 FINDINGS: Lungs are clear. Cardiomediastinal silhouette is normal. Mild stable curvature of the thoracic spine. IMPRESSION: No active cardiopulmonary disease. Electronically Signed   By: Toribio Agreste M.D.   On: 05/31/2024 16:19      Subjective: Patient seen and examined at bedside today.  Hemodynamically stable.  No new complaints today.  Appears euvolemic.  Eager to go home.  Medically stable for discharge home today  Discharge Exam: Vitals:   06/06/24 0300 06/06/24 0813  BP: 129/86 (!) 144/93  Pulse: 78 83  Resp: 17 16  Temp: (!) 97.5 F (36.4 C) (!) 97.3 F (36.3 C)  SpO2: 100% 95%   Vitals:   06/05/24 1900 06/05/24 2300 06/06/24 0300 06/06/24 0813  BP: 130/80 126/82 129/86 (!) 144/93  Pulse: 92 90 78 83  Resp: 20 19 17 16    Temp: 98.3 F (36.8 C) 98.4 F (36.9 C) (!) 97.5 F (36.4 C) (!) 97.3 F (36.3 C)  TempSrc: Oral Oral Oral Oral  SpO2: 91% 99% 100% 95%  Weight:   63.1 kg   Height:   5' 3 (1.6 m)     General: Pt is alert, awake, not in acute distress Cardiovascular: RRR, S1/S2 +, no rubs, no gallops Respiratory: CTA bilaterally, no wheezing, no rhonchi Abdominal: Soft, NT, ND, bowel sounds + Extremities: no edema, no cyanosis    The results of significant diagnostics from this hospitalization (including imaging, microbiology, ancillary and laboratory) are listed below for reference.     Microbiology: No results found for this or any previous visit (from the past 240 hours).   Labs: BNP (last 3 results) No results for input(s): BNP in the last 8760 hours. Basic Metabolic Panel: Recent Labs  Lab 06/02/24 0205 06/03/24 0237 06/04/24 0159  06/05/24 0211 06/06/24 0304  NA 135 138 138 137 139  K 3.9 4.1 4.4 4.4 4.6  CL 103 106 106 107 108  CO2 25 25 25 24 26   GLUCOSE 104* 148* 152* 127* 88  BUN 37* 41* 40* 40* 44*  CREATININE 2.26* 2.42* 2.80* 2.64* 2.80*  CALCIUM  8.4* 8.5* 8.1* 8.2* 8.4*   Liver Function Tests: Recent Labs  Lab 05/31/24 1451  AST 25  ALT 25  ALKPHOS 119  BILITOT 0.2  PROT 5.7*  ALBUMIN 2.8*   Recent Labs  Lab 05/31/24 2347  LIPASE 15   No results for input(s): AMMONIA in the last 168 hours. CBC: Recent Labs  Lab 05/31/24 1451 05/31/24 1506 05/31/24 2347  WBC 4.9  --  5.2  NEUTROABS 3.0  --   --   HGB 12.8* 12.6* 11.5*  HCT 38.0* 37.0* 34.2*  MCV 92.0  --  92.4  PLT 290  --  263   Cardiac Enzymes: No results for input(s): CKTOTAL, CKMB, CKMBINDEX, TROPONINI in the last 168 hours. BNP: Invalid input(s): POCBNP CBG: Recent Labs  Lab 06/05/24 0555 06/05/24 1126 06/05/24 1611 06/05/24 2125 06/06/24 0617  GLUCAP 222* 243* 192* 82 99   D-Dimer No results for input(s): DDIMER in the last 72 hours. Hgb A1c No results for  input(s): HGBA1C in the last 72 hours. Lipid Profile No results for input(s): CHOL, HDL, LDLCALC, TRIG, CHOLHDL, LDLDIRECT in the last 72 hours. Thyroid  function studies No results for input(s): TSH, T4TOTAL, T3FREE, THYROIDAB in the last 72 hours.  Invalid input(s): FREET3 Anemia work up No results for input(s): VITAMINB12, FOLATE, FERRITIN, TIBC, IRON, RETICCTPCT in the last 72 hours. Urinalysis    Component Value Date/Time   COLORURINE STRAW (A) 05/31/2024 1554   APPEARANCEUR CLEAR 05/31/2024 1554   LABSPEC 1.017 05/31/2024 1554   PHURINE 6.0 05/31/2024 1554   GLUCOSEU >=500 (A) 05/31/2024 1554   HGBUR SMALL (A) 05/31/2024 1554   BILIRUBINUR NEGATIVE 05/31/2024 1554   BILIRUBINUR negative 05/31/2024 1337   KETONESUR NEGATIVE 05/31/2024 1554   KETONESUR trace (5) (A) 05/31/2024 1337   PROTEINUR >=300 (A) 05/31/2024 1554   UROBILINOGEN 0.2 05/31/2024 1337   UROBILINOGEN 1.0 05/12/2014 1159   NITRITE NEGATIVE 05/31/2024 1554   NITRITE Negative 05/31/2024 1337   LEUKOCYTESUR NEGATIVE 05/31/2024 1554   LEUKOCYTESUR Negative 05/31/2024 1337   Sepsis Labs Recent Labs  Lab 05/31/24 1451 05/31/24 2347  WBC 4.9 5.2   Microbiology No results found for this or any previous visit (from the past 240 hours).  Please note: You were cared for by a hospitalist during your hospital stay. Once you are discharged, your primary care physician will handle any further medical issues. Please note that NO REFILLS for any discharge medications will be authorized once you are discharged, as it is imperative that you return to your primary care physician (or establish a relationship with a primary care physician if you do not have one) for your post hospital discharge needs so that they can reassess your need for medications and monitor your lab values.    Time coordinating discharge: 40 minutes  SIGNED:   Ivonne Mustache, MD  [facility] 06/06/2024,  10:57 AM Pager [phone]  If 7PM-7AM, please contact night-coverage [email or web address] Password [email or web address]    [1] No Known Allergies

## 2024-06-06 NOTE — Care Management Important Message (Signed)
 Important Message  Patient Details  Name: [name] MRN: [id number] Date of Birth: 1985/12/09   Important Message Given:        [name] 06/06/2024, 11:12 AM

## 2024-06-06 NOTE — TOC CM/SW Note (Signed)
 Transition of Care [facility]) - Inpatient Brief Assessment   Patient Details  Name: [name] MRN: [id number] Date of Birth: 1986-02-06  Transition of Care [facility]) CM/SW Contact:    Waddell Barnie Rama, RN Phone Number: 06/06/2024, 11:09 AM   Clinical Narrative: From home with sister, has no  PCP and has insurance on file, states has no HH services in place at this time or DME at home.  States family member will transport them home at [employer] and family is support system, states gets medications from [facility] in [place].  Pta self ambulatory.   NCM assisted in getting a PCP apt.     Transition of Care Asessment: Insurance and Status: Insurance coverage has been reviewed Patient has primary care physician: No Home environment has been reviewed: home with sister Prior level of function:: indep Prior/Current Home Services: No current home services Social Drivers of Health Review: SDOH reviewed no interventions necessary Readmission risk has been reviewed: Yes Transition of care needs: transition of care needs identified, TOC will continue to follow

## 2024-06-06 NOTE — TOC Transition Note (Addendum)
 Transition of Care [facility]) - Discharge Note   Patient Details  Name: [name] MRN: [id number] Date of Birth: 1985/07/02  Transition of Care [facility]) CM/SW Contact:  Waddell Barnie Rama, RN Phone Number: 06/06/2024, 11:11 AM   Clinical Narrative:    For dc today, NCM assisting with getting PCP follow up apt. He has transport at dc.  NCM asked financial counseling to contact patient to speak to him about applying for medicaid.         Patient Goals and CMS Choice            Discharge Placement                       Discharge Plan and Services Additional resources added to the After Visit Summary for                                       Social Drivers of Health (SDOH) Interventions SDOH Screenings   Food Insecurity: No Food Insecurity (06/01/2024)  Housing: Low Risk (06/01/2024)  Transportation Needs: No Transportation Needs (06/01/2024)  Utilities: Not At Risk (06/01/2024)  Tobacco Use: Low Risk (05/31/2024)     Readmission Risk Interventions     No data to display

## 2024-06-06 NOTE — Progress Notes (Signed)
 Admit: 05/31/2024 LOS: 6  68M with AKI on CKD3a admit with hyperglycemia, contrast exposure  Subjective:  Creatinine slightly up at 2.8, feels good without uremic symptoms.  No swelling, lying flat in the bed, no dyspnea. 1.7 L urine output yesterday Continues to be normotensive.   01/13 0701 - 01/14 0700 In: 957 [P.O.:957] Out: 1700 [Urine:1700]  Filed Weights   06/04/24 0500 06/05/24 0338 06/06/24 0300  Weight: 62.8 kg 62.9 kg 63.1 kg    Scheduled Meds:  carvedilol   25 mg Oral BID WC   heparin   5,000 Units Subcutaneous Q8H   hydrALAZINE   50 mg Oral Q8H   insulin  aspart  0-5 Units Subcutaneous QHS   insulin  aspart  0-9 Units Subcutaneous TID WC   insulin  aspart protamine- aspart  25 Units Subcutaneous BID WC   pantoprazole   40 mg Oral Daily   sodium chloride  flush  3 mL Intravenous Q12H   Continuous Infusions: PRN Meds:.acetaminophen , labetalol , nitroGLYCERIN , ondansetron  (ZOFRAN ) IV, mouth rinse, sodium chloride  flush  Current labs: reviewed    Physical Exam:  Blood pressure (!) 144/93, pulse 83, temperature (!) 97.3 F (36.3 C), temperature source Oral, resp. rate 16, height 5' 3 (1.6 m), weight 63.1 kg, SpO2 95%. NAD, lying flat in bed without respiratory distress Regular, normal S1 and S2 Clear bilaterally No peripheral edema  A AKI, nonoliguric on CKD 3 with component of contrast exposure contributing; baseline kidney disease likely from diabetic nephropathy Uncontrolled hypertension, medications being adjusted, improved from presentation; currently carvedilol , hydralazine . Uncontrolled DM1 with A1c of 13.6%  P Okay to discharge today and I have set up labs before the end of the week and close follow-up in office.  Discussed with patient and mother by phone and were in agreement.  Bernardino Gasman MD 06/06/2024, 11:14 AM  Recent Labs  Lab 06/04/24 0159 06/05/24 0211 06/06/24 0304  NA 138 137 139  K 4.4 4.4 4.6  CL 106 107 108  CO2 25 24 26   GLUCOSE 152*  127* 88  BUN 40* 40* 44*  CREATININE 2.80* 2.64* 2.80*  CALCIUM  8.1* 8.2* 8.4*   Recent Labs  Lab 05/31/24 1451 05/31/24 1506 05/31/24 2347  WBC 4.9  --  5.2  NEUTROABS 3.0  --   --   HGB 12.8* 12.6* 11.5*  HCT 38.0* 37.0* 34.2*  MCV 92.0  --  92.4  PLT 290  --  263

## 2024-06-06 NOTE — Progress Notes (Signed)
 Reviewed AVS, patient expressed understanding of medications, MD follow up reviewed.   Removed IV, Site clean, dry and intact.  CCMD contacted and informed patients is being discharged.  Patient states all belongings brought to the hospital at time of admission are accounted for and packed to take home.  Picked up medications from [facility] pharmacy. [facility] Transport contacted to transport patient to entrance A, where family member was waiting in vehicle to transport home.

## 2024-06-13 ENCOUNTER — Other Ambulatory Visit: Payer: Self-pay

## 2024-06-13 ENCOUNTER — Emergency Department (HOSPITAL_COMMUNITY)

## 2024-06-13 ENCOUNTER — Emergency Department (HOSPITAL_COMMUNITY)
Admission: EM | Admit: 2024-06-13 | Discharge: 2024-06-13 | Disposition: A | Discharge status: Home / Self Care | Attending: Emergency Medicine | Admitting: Emergency Medicine

## 2024-06-13 ENCOUNTER — Encounter (HOSPITAL_COMMUNITY): Payer: Self-pay

## 2024-06-13 DIAGNOSIS — E1065 Type 1 diabetes mellitus with hyperglycemia: Secondary | ICD-10-CM | POA: Insufficient documentation

## 2024-06-13 DIAGNOSIS — I509 Heart failure, unspecified: Secondary | ICD-10-CM | POA: Insufficient documentation

## 2024-06-13 DIAGNOSIS — M7989 Other specified soft tissue disorders: Secondary | ICD-10-CM | POA: Diagnosis present

## 2024-06-13 DIAGNOSIS — R609 Edema, unspecified: Secondary | ICD-10-CM

## 2024-06-13 DIAGNOSIS — E8809 Other disorders of plasma-protein metabolism, not elsewhere classified: Secondary | ICD-10-CM | POA: Insufficient documentation

## 2024-06-13 DIAGNOSIS — Z794 Long term (current) use of insulin: Secondary | ICD-10-CM | POA: Diagnosis not present

## 2024-06-13 LAB — COMPREHENSIVE METABOLIC PANEL WITH GFR
ALT: 28 U/L (ref 0–44)
AST: 37 U/L (ref 15–41)
Albumin: 2.7 g/dL — ABNORMAL LOW (ref 3.5–5.0)
Alkaline Phosphatase: 86 U/L (ref 38–126)
Anion gap: 11 (ref 5–15)
BUN: 27 mg/dL — ABNORMAL HIGH (ref 6–20)
CO2: 20 mmol/L — ABNORMAL LOW (ref 22–32)
Calcium: 8.3 mg/dL — ABNORMAL LOW (ref 8.9–10.3)
Chloride: 103 mmol/L (ref 98–111)
Creatinine, Ser: 2.4 mg/dL — ABNORMAL HIGH (ref 0.61–1.24)
GFR, Estimated: 35 mL/min — ABNORMAL LOW
Glucose, Bld: 253 mg/dL — ABNORMAL HIGH (ref 70–99)
Potassium: 4.6 mmol/L (ref 3.5–5.1)
Sodium: 134 mmol/L — ABNORMAL LOW (ref 135–145)
Total Bilirubin: <0.2 mg/dL (ref 0.0–1.2)
Total Protein: 5.6 g/dL — ABNORMAL LOW (ref 6.5–8.1)

## 2024-06-13 LAB — PRO BRAIN NATRIURETIC PEPTIDE: Pro Brain Natriuretic Peptide: 486 pg/mL — ABNORMAL HIGH

## 2024-06-13 LAB — CBG MONITORING, ED: Glucose-Capillary: 247 mg/dL — ABNORMAL HIGH (ref 70–99)

## 2024-06-13 LAB — CBC
HCT: 32.1 % — ABNORMAL LOW (ref 39.0–52.0)
Hemoglobin: 10.3 g/dL — ABNORMAL LOW (ref 13.0–17.0)
MCH: 31.1 pg (ref 26.0–34.0)
MCHC: 32.1 g/dL (ref 30.0–36.0)
MCV: 97 fL (ref 80.0–100.0)
Platelets: 317 K/uL (ref 150–400)
RBC: 3.31 MIL/uL — ABNORMAL LOW (ref 4.22–5.81)
RDW: 13.2 % (ref 11.5–15.5)
WBC: 7.2 K/uL (ref 4.0–10.5)
nRBC: 0 % (ref 0.0–0.2)

## 2024-06-13 LAB — CK: Total CK: 266 U/L (ref 49–397)

## 2024-06-13 NOTE — ED Provider Triage Note (Signed)
 Emergency Medicine Provider Triage Evaluation Note  RAMON ZANDERS , a 39 y.o. male  was evaluated in triage.  Pt complains of bilateral leg swelling and discomfort.  Recently diagnosed with hypertensive emergency with admission.  Patient is worried that he is having repeat bout of issues with hypertension because his legs began swelling again he is having some trouble walking because they are heavy and sore.  He denies chest pain or shortness of breath.  Patient also has been having low blood sugars in the morning when he awakes.  Review of Systems  Positive: Leg swelling Negative: Chest pain  Physical Exam  BP (!) 145/93 (BP Location: Left Arm)   Pulse 92   Temp (!) 97.5 F (36.4 C) (Oral)   Resp 16   SpO2 98%  Gen:   Awake, no distress   Resp:  Normal effort  MSK:   Moves extremities without difficulty  Other:  Mild peripheral edema  Medical Decision Making  Medically screening exam initiated at 1:43 PM.  Appropriate orders placed.  Kamin Niblack Nottingham was informed that the remainder of the evaluation will be completed by another provider, this initial triage assessment does not replace that evaluation, and the importance of remaining in the ED until their evaluation is complete.     Arloa Chroman, PA-C 06/13/24 1345

## 2024-06-13 NOTE — ED Triage Notes (Signed)
 Pt. Stated, I was in the hospital for leg pain and now its back more pain on the right. Im also having low blood sugar.

## 2024-06-13 NOTE — Discharge Instructions (Addendum)
 Your leg swelling is likely due to low albumin protein in your body.  Please focus on eating foods high in protein as this will help your body reabsorb the fluid and decrease leg swelling.  Wear compressive TED hose on your legs to help decrease swelling.  Keep your legs elevated at rest.  Please also make sure to recalibrate your glucose monitoring device and follow-up closely with your doctor for further care.

## 2024-06-13 NOTE — ED Provider Notes (Signed)
 " [place] EMERGENCY DEPARTMENT AT [facility] Provider Note   CSN: [id number] Arrival date & time: 06/13/24  1223     Patient presents with: Leg Pain, Hypoglycemia, and Leg Swelling   [name] is a 39 y.o. male.   The history is provided by the patient, a relative and medical records. No language interpreter was used.  Leg Pain Hypoglycemia    39 year old male with history of type 1 diabetes, CHF, impaired cognition, of thyroid  disease presenting with concerns of leg pain.  Patient states several weeks ago he was admitted to the hospital for leg swelling and shortness of breath.  He was found to be in CHF exacerbation and did receive IV Lasix  during his hospital course.  He stayed in the hospital for nearly a week and was discharged a week ago.  States he was doing better for a few days but now he is noticing bilateral leg swelling similar to prior.  He does not endorse any fever but endorses chills.  Does not complain of any chest pain or shortness of breath no abdominal pain no nausea vomiting diarrhea no urinary complaint.  He mention he is compliant with his medications.  Furthermore, patient also states that his blood sugars been running lower than usual on his continuous glucose monitoring device.  He is unsure if the new medications is affecting it.  He denies any history of prior PE or DVT.  He mention he had a CT scan of his chest during his last hospital visit and there was no evidence of any PE.  He has not weigh himself since he was discharged in the hospital.  His niece who is at bedside also provide additional history.  Prior to Admission medications  Medication Sig Start Date End Date Taking? Authorizing Provider  Blood Pressure Monitoring (OMRON 3 SERIES BP MONITOR) DEVI 1 each by Does not apply route daily. 06/06/24   Jillian Buttery, MD  carvedilol  (COREG ) 25 MG tablet Take 1 tablet (25 mg total) by mouth 2 (two) times daily with a meal. 06/06/24    Jillian Buttery, MD  hydrALAZINE  (APRESOLINE ) 50 MG tablet Take 1 tablet (50 mg total) by mouth every 8 (eight) hours. 06/06/24   Jillian Buttery, MD  NOVOLOG  MIX 70/30 FLEXPEN (70-30) 100 UNIT/ML FlexPen Inject 24-34 Units into the skin 2 (two) times daily with a meal. 34 UNITS WITH BREAKFAST, 24 WITH SUPPER. MAX DAILY DOSE 55 UNITS 05/02/24   [provider]  pantoprazole  (PROTONIX ) 40 MG tablet Take 1 tablet (40 mg total) by mouth daily. 06/07/24   Jillian Buttery, MD    Allergies: Patient has no known allergies.    Review of Systems  All other systems reviewed and are negative.   Updated Vital Signs BP (!) 145/93 (BP Location: Left Arm)   Pulse 92   Temp (!) 97.5 F (36.4 C) (Oral)   Resp 16   SpO2 98%   Physical Exam Constitutional:      General: He is not in acute distress.    Appearance: He is well-developed.  HENT:     Head: Atraumatic.  Eyes:     Conjunctiva/sclera: Conjunctivae normal.  Cardiovascular:     Rate and Rhythm: Normal rate and regular rhythm.     Pulses: Normal pulses.     Heart sounds: Normal heart sounds.  Pulmonary:     Effort: Pulmonary effort is normal.     Breath sounds: Normal breath sounds.  Abdominal:  Tenderness: There is no abdominal tenderness.  Musculoskeletal:     Cervical back: Normal range of motion and neck supple.     Right lower leg: Edema present.     Left lower leg: Edema present.  Skin:    Findings: No rash.  Neurological:     Mental Status: He is alert. Mental status is at baseline.     (all labs ordered are listed, but only abnormal results are displayed) Labs Reviewed  CBC - Abnormal; Notable for the following components:      Result Value   RBC 3.31 (*)    Hemoglobin 10.3 (*)    HCT 32.1 (*)    All other components within normal limits  COMPREHENSIVE METABOLIC PANEL WITH GFR - Abnormal; Notable for the following components:   Sodium 134 (*)    CO2 20 (*)    Glucose, Bld 253 (*)    BUN 27 (*)     Creatinine, Ser 2.40 (*)    Calcium  8.3 (*)    Total Protein 5.6 (*)    Albumin 2.7 (*)    GFR, Estimated 35 (*)    All other components within normal limits  PRO BRAIN NATRIURETIC PEPTIDE - Abnormal; Notable for the following components:   Pro Brain Natriuretic Peptide 486.0 (*)    All other components within normal limits  CBG MONITORING, ED - Abnormal; Notable for the following components:   Glucose-Capillary 247 (*)    All other components within normal limits    EKG: EKG Interpretation Date/Time:  Wednesday June 13 2024 14:18:48 EST Ventricular Rate:  92 PR Interval:  148 QRS Duration:  80 QT Interval:  366 QTC Calculation: 452 R Axis:   41  Text Interpretation: Normal sinus rhythm  nonspecific T waves diffusely Confirmed by Freddi Hamilton [id number]) on 06/13/2024 3:10:26 PM  Radiology: [facility] Chest 2 View Result Date: 06/13/2024 CLINICAL DATA:  Hypertension EXAM: CHEST - 2 VIEW COMPARISON:  May 31, 2024 FINDINGS: The heart size and mediastinal contours are within normal limits. Both lungs are clear. The visualized skeletal structures are unremarkable. IMPRESSION: No active cardiopulmonary disease. Electronically Signed   By: Lynwood [name] Raddle M.D.   On: 06/13/2024 14:22     Procedures   Medications Ordered in the ED - No data to display                                  Medical Decision Making Amount and/or Complexity of Data Reviewed Labs: ordered.   BP (!) 145/93 (BP Location: Left Arm)   Pulse 92   Temp (!) 97.5 F (36.4 C) (Oral)   Resp 16   SpO2 98%   96:72 PM  39 year old male with history of type 1 diabetes, CHF, impaired cognition, of thyroid  disease presenting with concerns of leg pain.  Patient states several weeks ago he was admitted to the hospital for leg swelling and shortness of breath.  He was found to be in CHF exacerbation and did receive IV Lasix  during his hospital course.  He stayed in the hospital for nearly a week and was discharged a week  ago.  States he was doing better for a few days but now he is noticing bilateral leg swelling similar to prior.  He does not endorse any fever but endorses chills.  Does not complain of any chest pain or shortness of breath no abdominal pain no nausea vomiting diarrhea no urinary  complaint.  He mention he is compliant with his medications.  Furthermore, patient also states that his blood sugars been running lower than usual on his continuous glucose monitoring device.  He is unsure if the new medications is affecting it.  He denies any history of prior PE or DVT.  He mention he had a CT scan of his chest during his last hospital visit and there was no evidence of any PE.  He has not weigh himself since he was discharged in the hospital.  On exam patient is resting comfortably appears to be in no acute discomfort.  He is nontoxic in appearance.  His lungs are clear.  He does have moderate nonpitting edema involving bilateral lower extremities without any overlying skin changes.  His vital signs overall reassuring.  -Labs ordered, independently viewed and interpreted by me.  Labs remarkable for CBG of 253 with normal anion gap unconcerning for DKA.  Creatinine is impaired at 2.4 however improved from prior.  He does have low albumin of 2.7 which may be a contributor to his nonpitting edema.  proBNP is 486, improved from 2 weeks ago. -The patient was maintained on a cardiac monitor.  I personally viewed and interpreted the cardiac monitored which showed an underlying rhythm of: Sinus rhythm -Imaging independently viewed and interpreted by me and I agree with radiologist's interpretation.  Result remarkable for chest x-ray unremarkable -This patient presents to the ED for concern of leg swelling, this involves an extensive number of treatment options, and is a complaint that carries with it a high risk of complications and morbidity.  The differential diagnosis includes CHF exacerbation, hypoalbuminemia, DVT,  anasarca, peripheral edema, cellulitis, -Co morbidities that complicate the patient evaluation includes diabetes, CHF, thyroid  disease -Treatment includes recommend TED hose at home, eat food high in protein to help fix low albumin, recalibrating his glucose meter at home and outpt f/u -Reevaluation of the patient after these medicines showed that the patient stayed the same -PCP office notes or outside notes reviewed -Discussion with attending Dr. Freddi -Escalation to admission/observation considered: patients feels much better, is comfortable with discharge, and will follow up with PCP -Prescription medication considered, patient comfortable with current meds -Social Determinant of Health considered   After further review of patient's previous hospital admission, his impaired renal function was thought to be related to IV contrast nephropathy and due to impaired renal function, additional IV Lasix  may not provide much relief especially in the setting of low albumin which may contribute to his leg swelling.  Do not think this is DVT as it involves both legs.  Since his chest x-ray is normal he does not appear to be in CHF exacerbation requiring hospital admission.      Final diagnoses:  Non-pitting edema  Hypoalbuminemia due to protein-calorie malnutrition    ED Discharge Orders     None          Nivia Colon, PA-C 06/13/24 1553  "

## 2024-06-15 ENCOUNTER — Emergency Department
Admission: EM | Admit: 2024-06-15 | Discharge: 2024-06-15 | Disposition: A | Discharge status: Home / Self Care | Attending: Emergency Medicine | Admitting: Emergency Medicine

## 2024-06-15 ENCOUNTER — Telehealth: Payer: Self-pay | Admitting: Cardiovascular Disease

## 2024-06-15 ENCOUNTER — Other Ambulatory Visit: Payer: Self-pay

## 2024-06-15 ENCOUNTER — Emergency Department

## 2024-06-15 DIAGNOSIS — N189 Chronic kidney disease, unspecified: Secondary | ICD-10-CM | POA: Insufficient documentation

## 2024-06-15 DIAGNOSIS — I509 Heart failure, unspecified: Secondary | ICD-10-CM | POA: Insufficient documentation

## 2024-06-15 DIAGNOSIS — R601 Generalized edema: Secondary | ICD-10-CM | POA: Diagnosis present

## 2024-06-15 DIAGNOSIS — E1022 Type 1 diabetes mellitus with diabetic chronic kidney disease: Secondary | ICD-10-CM | POA: Insufficient documentation

## 2024-06-15 HISTORY — DX: Heart failure, unspecified: I50.9

## 2024-06-15 LAB — BASIC METABOLIC PANEL WITH GFR
Anion gap: 6 (ref 5–15)
BUN: 36 mg/dL — ABNORMAL HIGH (ref 6–20)
CO2: 25 mmol/L (ref 22–32)
Calcium: 8.8 mg/dL — ABNORMAL LOW (ref 8.9–10.3)
Chloride: 111 mmol/L (ref 98–111)
Creatinine, Ser: 2.18 mg/dL — ABNORMAL HIGH (ref 0.61–1.24)
GFR, Estimated: 39 mL/min — ABNORMAL LOW
Glucose, Bld: 52 mg/dL — ABNORMAL LOW (ref 70–99)
Potassium: 4.9 mmol/L (ref 3.5–5.1)
Sodium: 142 mmol/L (ref 135–145)

## 2024-06-15 LAB — CBC
HCT: 31.1 % — ABNORMAL LOW (ref 39.0–52.0)
Hemoglobin: 9.9 g/dL — ABNORMAL LOW (ref 13.0–17.0)
MCH: 31.1 pg (ref 26.0–34.0)
MCHC: 31.8 g/dL (ref 30.0–36.0)
MCV: 97.8 fL (ref 80.0–100.0)
Platelets: 335 K/uL (ref 150–400)
RBC: 3.18 MIL/uL — ABNORMAL LOW (ref 4.22–5.81)
RDW: 13.6 % (ref 11.5–15.5)
WBC: 7.4 K/uL (ref 4.0–10.5)
nRBC: 0 % (ref 0.0–0.2)

## 2024-06-15 LAB — CBG MONITORING, ED
Glucose-Capillary: 50 mg/dL — ABNORMAL LOW (ref 70–99)
Glucose-Capillary: 76 mg/dL (ref 70–99)

## 2024-06-15 LAB — PRO BRAIN NATRIURETIC PEPTIDE: Pro Brain Natriuretic Peptide: 711 pg/mL — ABNORMAL HIGH

## 2024-06-15 MED ORDER — FUROSEMIDE 20 MG PO TABS: 20.0000 mg | ORAL_TABLET | ORAL | 0 refills | Status: DC

## 2024-06-15 NOTE — Telephone Encounter (Signed)
 Pt c/o swelling/edema: STAT if pt has developed SOB within 24 hours  If swelling, where is the swelling located? All over   How much weight have you gained and in what time span? Yes   Have you gained 2 pounds in a day or 5 pounds in a week? Not sure   Do you have a log of your daily weights (if so, list)? No   Are you currently taking a fluid pill? No   Are you currently SOB? No   Have you traveled recently in a car or plane for an extended period of time? No   Pt called with his grandmother concerned about swelling. She states he is swollen all over, even in his face. Please advise.

## 2024-06-15 NOTE — Discharge Instructions (Addendum)
 Please use compression stockings during the day and remove them while sleeping

## 2024-06-15 NOTE — ED Notes (Signed)
 Pt has glucose monitor that started beeping & was reading 54-->checked CBG, it was 50-->gave orange juice. Pt is not clammy or shaky. Drinking juice now.

## 2024-06-15 NOTE — Telephone Encounter (Signed)
 Called patient's grandmother - pt is having whole body swelling and elevated BS and BP - grandmother states he had depression from coreg  to the point of feeling out of body  Advised to take patient to ER to be evaluated and treated - grandmother verbalized understanding and agreement with plan  You last saw pt in office 4/23

## 2024-06-15 NOTE — ED Provider Notes (Signed)
 "  [facility] Provider Note   Event Date/Time   First MD Initiated Contact with Patient 06/15/24 1239     (approximate) History  Edema (generalized)  HPI [name] is a 39 y.o. male with a past medical history of type 1 diabetes, CKD, CHF, and ACS who presents complaining of of generalized edema that began this morning and has already improved.  Patient states that he has been told to not take any Lasix  due to his kidney function however occasionally does go on to Lasix  due to volume overload.  Patient has not taken any Lasix  recently.  Patient denies any shortness of breath or dyspnea on exertion. ROS: Patient currently denies any vision changes, tinnitus, difficulty speaking, facial droop, sore throat, chest pain, shortness of breath, abdominal pain, nausea/vomiting/diarrhea, dysuria, or weakness/numbness/paresthesias in any extremity   Physical Exam  Triage Vital Signs: ED Triage Vitals  Encounter Vitals Group     BP 06/15/24 1156 (!) 167/108     Girls Systolic BP Percentile --      Girls Diastolic BP Percentile --      Boys Systolic BP Percentile --      Boys Diastolic BP Percentile --      Pulse Rate 06/15/24 1152 94     Resp 06/15/24 1152 20     Temp 06/15/24 1152 99 F (37.2 C)     Temp Source 06/15/24 1152 Oral     SpO2 06/15/24 1152 100 %     Weight 06/15/24 1155 138 lb 14.2 oz (63 kg)     Height 06/15/24 1155 5' 3 (1.6 m)     Head Circumference --      Peak Flow --      Pain Score 06/15/24 1153 0     Pain Loc --      Pain Education --      Exclude from Growth Chart --    Most recent vital signs: Vitals:   06/15/24 1152 06/15/24 1156  BP:  (!) 167/108  Pulse: 94   Resp: 20   Temp: 99 F (37.2 C)   SpO2: 100%    General: Awake, oriented x4. CV:  Good peripheral perfusion. Resp:  Normal effort. Abd:  No distention. Other:  Middle-aged well-developed, well-nourished Caucasian male resting comfortably in no acute distress.   2+ bilateral lower extremity edema ED Results / Procedures / Treatments  Labs (all labs ordered are listed, but only abnormal results are displayed) Labs Reviewed  BASIC METABOLIC PANEL WITH GFR - Abnormal; Notable for the following components:      Result Value   Glucose, Bld 52 (*)    BUN 36 (*)    Creatinine, Ser 2.18 (*)    Calcium  8.8 (*)    GFR, Estimated 39 (*)    All other components within normal limits  CBC - Abnormal; Notable for the following components:   RBC 3.18 (*)    Hemoglobin 9.9 (*)    HCT 31.1 (*)    All other components within normal limits  PRO BRAIN NATRIURETIC PEPTIDE - Abnormal; Notable for the following components:   Pro Brain Natriuretic Peptide 711.0 (*)    All other components within normal limits  CBG MONITORING, ED - Abnormal; Notable for the following components:   Glucose-Capillary 50 (*)    All other components within normal limits  CBG MONITORING, ED   EKG ED ECG REPORT I, Artist MARLA Kerns, the attending physician, personally viewed and interpreted this ECG.  Date: 06/15/2024 EKG Time: 1200 Rate: 92 Rhythm: normal sinus rhythm QRS Axis: normal Intervals: normal ST/T Wave abnormalities: normal Narrative Interpretation: no evidence of acute ischemia RADIOLOGY ED MD interpretation: 2 view chest x-ray interpreted by me shows no evidence of acute abnormalities including no pneumonia, pneumothorax, or widened mediastinum - All radiology independently interpreted and agree with radiology assessment Official radiology report(s): DG Chest 2 View Result Date: 06/15/2024 EXAM: 2 VIEW(S) XRAY OF THE CHEST 06/15/2024 12:15:00 PM COMPARISON: 06/13/2024 CLINICAL HISTORY: Fluid overload. FINDINGS: LUNGS AND PLEURA: Trace left pleural effusion in the posterior costophrenic angle. No focal pulmonary opacity. No pneumothorax. HEART AND MEDIASTINUM: No acute abnormality of the cardiac and mediastinal silhouettes. BONES AND SOFT TISSUES: Mild thoracic scoliosis.  IMPRESSION: 1. Trace left pleural effusion. Electronically signed by: Rogelia Myers MD 06/15/2024 12:51 PM EST RP Workstation: [id number]   PROCEDURES: Critical Care performed: No Procedures MEDICATIONS ORDERED IN ED: Medications - No data to display IMPRESSION / MDM / ASSESSMENT AND PLAN / ED COURSE  I reviewed the triage vital signs and the nursing notes.                             The patient is on the cardiac monitor to evaluate for evidence of arrhythmia and/or significant heart rate changes. Patient's presentation is most consistent with acute presentation with potential threat to life or bodily function. Patient is a 39 year old male with the above-stated past medical history who presents complaining of generalized edema.  Patient has minimal edema on exam and patient's creatinine is at baseline.  It has actually improved somewhat from his hospital stay with them in the past few weeks.  Patient shows no signs of respiratory distress and only has trace pleural effusion on chest x-ray.  I spoke to Dr. Dennise and nephrology who recommends a 20 mg as needed twice weekly dose of Lasix  as well as compression stockings for continued lower extremity edema.  Patient and family at bedside agree with this plan and all questions were answered prior to discharge.  Patient given strict return precautions  Dispo: Discharge   FINAL CLINICAL IMPRESSION(S) / ED DIAGNOSES   Final diagnoses:  Generalized edema  Chronic kidney disease, unspecified CKD stage   Rx / DC Orders   ED Discharge Orders          Ordered    furosemide  (LASIX ) 20 MG tablet  2 times weekly        06/15/24 1343           Note:  This document was prepared using Dragon voice recognition software and may include unintentional dictation errors.   Marquesa Rath K, MD 06/15/24 [phone]  "

## 2024-06-15 NOTE — ED Triage Notes (Signed)
 Pt to ED with grandmother for generalized edema since yesterday.  Pt has CKD stage 3 and CHF and per grandmother pt cannot take diuretics because of the CKD. Pt has facial edema and +2 to +3 pitting edema to both LE. Pt is legally blind. Pt denies CP and SOB. Told by cardiologist to come to ED. Recently started carvedilol  since 1/14. Respirations are unlabored.

## 2024-06-20 NOTE — Progress Notes (Unsigned)
 "  Cardiology Office Note    Date:  06/21/2024   ID:  [name], DOB Jun 04, 1985, MRN [id number]  PCP:  Gareth Mliss FALCON, FNP  Cardiologist:  Deatrice Cage, MD  Electrophysiologist:  None   Chief Complaint: Hospital follow-up  History of Present Illness:   [name] is a 39 y.o. 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  He was admitted in 10/2018 with left-sided chest pain.  He was found to have a mildly elevated troponin of 0.35 which remained flat and nontrending.  CTA of the chest showed no evidence of PE or aortic dissection along with no evidence of significant coronary artery calcification.  Echo showed an EF of 60 to 65%, normal LV diastolic function, normal RV systolic function and cavity size, trivial mitral regurgitation.  Lexiscan  MPI showed no significant ischemia with an EF of 55 to 65% and was overall a low risk study.  No clear cause for his elevated troponin was identified with recommendation for diagnostic cath in the setting of recurrent symptoms.  He was last seen in the office in 08/2021 and was doing well from a cardiac perspective.  He reported he was more adherent to insulin .  Given lack of anginal symptoms, further cardiac testing was deferred at that time.  Since he was last seen in the office he has been seen in the ER for at hospital admission in the setting of hyperglycemia with diabetic ketoacidosis.  He was admitted to the hospital 1/8 through 06/06/2024 with progressive lower extremity swelling and shortness of breath in the setting of hypertensive emergency with an admission BP of 211/139.  Admission notable for acute on CKD stage IIIb with  creatinine trending up to 2.80, initial and peak high-sensitivity troponin 114, proBNP 611, sed rate 38, Hgb 12.8.  CTA chest negative for PE with evidence of acute pancreatitis as well as reactive duodenitis or peptic ulcer disease.  Renal ultrasound showed mild bilateral hydronephrosis of uncertain etiology.  Echo showed an EF of 50 to 55%, no regional wall motion abnormalities, normal RV systolic function and ventricular cavity size, tricuspid aortic valve, and a normal CVP.  He was evaluated by cardiology for elevated troponin which was felt to be reflective of supply/demand ischemia in the setting of acute on CKD stage III as well as hypertensive emergency with recommendation for consideration of outpatient noninvasive ischemic testing.  He received 2 doses of Lasix  with discontinuation due to progressive renal dysfunction.  Lower extremity swelling was ultimately felt to be related to third spacing from hypoalbuminemia.  Following discharge on 06/06/2024 he returned to the ER on 06/13/2024 with recurrent lower extremity swelling.  BNP was improving at 486, kidney function had improved from 2.8-2.4, hypoalbuminemia persisted at 2.7, hemoglobin was downtrending from 11.5-10.3, and chest x-ray showed no active cardiopulmonary disease.  Swelling was felt to be due to hypoalbuminemia due to protein calorie malnutrition with outpatient follow-up recommended.  He was seen again in the ER on 06/15/2024 with swelling earlier in the morning that subsequently improved.  He was hypertensive with a blood pressure of 167/108.  proBNP 711, hemoglobin downtrending further to 9.9, creatinine improving at 2.18, a chest x-ray with trace left pleural effusion.  Case was discussed with nephrology who recommended furosemide  20 mg  as needed twice weekly as well as compression socks.  He comes in today accompanied by family and is without symptoms of angina or cardiac decompensation.  No frank chest pain.  Lower extremity swelling  is improved over the past 2 days.  He took a 20 mg of Lasix  on 1/23 and again on 1/24, none since.  Wearing compression socks.  No progressive orthopnea or abdominal distention.  No early satiety.  Continues to struggle with labile blood pressures and sugars.  Weight trend at home from 139 pounds 1/15 to 151 pounds on 1/22.  Has appointment with nephrology on 1/30.   Labs independently reviewed: 05/2024 - proBNP 711, Hgb 9.9, PLT 335, potassium 4.9, BUN 36, serum creatinine 2.18, albumin 2.7, AST/ALT normal, free T4 normal, TSH 5.45 LP(a) 293, A1c 13.6 08/2022 - TC 192, TG 180, HDL 60, LDL 96  Past Medical History:  Diagnosis Date   Angiopathy, diabetic (HCC)    CHF (congestive heart failure) (HCC)    Diabetes mellitus    Type 1, diagnosed age 22, with frequent DKA admissions, difficult to control due to MR   Diabetic nephropathy (HCC)    Started on Lisinopril  5 mg   Diabetic peripheral neuropathy (HCC)    Dysmorphic features    Goiter    Hypertension    Hypoglycemia associated with diabetes (HCC)    Hypothyroidism    Impaired cognition    mention of mental retardation   Mental retardation    Moderate or severe vision impairment, both eyes, impairment level not further specified    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   Thyroiditis, autoimmune     Past Surgical History:  Procedure Laterality Date   DENTAL SURGERY     EYE SURGERY      Current Medications: Active Medications[1]  Allergies:   Patient has no known allergies.   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   Drug use: No   Sexual activity: Never  Other Topics Concern   Not on file  Social History Narrative   Lives in [place] with parents. Patient has 2 siblings a brother with a cardiac condition and a sister who is healthy.   Patient is legally blind and thus cannot drive.    Notable for developmental delay and illiteracy. 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   Social Drivers of Health   Tobacco Use: Low Risk (06/21/2024)   Patient History    Smoking Tobacco Use: Never    Smokeless Tobacco Use: Never    Passive Exposure: Not on file  Financial Resource Strain: Low Risk (06/21/2024)   Overall Financial Resource Strain (CARDIA)    Difficulty of Paying Living Expenses: Not hard at all  Food Insecurity: No Food Insecurity (06/01/2024)   Epic    Worried About Radiation Protection Practitioner of Food in the Last Year: Never true    [employer]  in the Last Year: Never true  Transportation Needs: No Transportation Needs (06/01/2024)   Epic    Lack of Transportation (Medical): No    Lack of Transportation (Non-Medical): No  Physical Activity: Inactive (06/21/2024)   Exercise Vital Sign    Days of Exercise per Week: 0 days    Minutes of Exercise per Session: 0 min  Stress: Not on file  Social Connections: Socially Isolated (06/21/2024)   Social Connection and Isolation Panel    Frequency of Communication with Friends and Family: Once a week    Frequency of Social Gatherings with Friends and Family: Once a week    Attends Religious Services: Never    Database Administrator or Organizations: No    Attends Banker Meetings: Never    Marital Status: Never married  Depression (PHQ2-9): Medium Risk (06/21/2024)   Depression (PHQ2-9)    PHQ-2 Score: 5  Alcohol Screen: Low Risk (06/21/2024)   Alcohol Screen    Last Alcohol Screening Score (AUDIT): 0  Housing: Low Risk (06/01/2024)   Epic    Unable to Pay for  Housing in the Last Year: No    Number of Times Moved in the Last Year: 0    Homeless in the Last Year: No  Utilities: Not At Risk (06/01/2024)   Epic    Threatened with loss of utilities: No  Health Literacy: Inadequate Health Literacy (06/21/2024)   B1300 Health Literacy    Frequency of need for help with medical instructions: Always     Family History:  The patient's family history includes Diabetes in his cousin, maternal grandmother, and paternal grandmother; Retinitis pigmentosa in his mother; Vision loss in his father.  ROS:   12-point review of systems is negative unless otherwise noted in the HPI.   EKGs/Labs/Other Studies Reviewed:    Studies reviewed were summarized above. The additional studies were reviewed today:  2D echo 10/2018: 1. The left ventricle has normal systolic function with an ejection  fraction of 60-65%. The cavity size was normal. Left ventricular diastolic  parameters were normal.   2. The right ventricle has normal systolic function. The cavity was  normal. There is no increase in right ventricular wall thickness.  __________   Lexiscan  MPI 10/2018: Blood pressure demonstrated a normal response to exercise. There was no ST segment deviation noted during stress. The study is normal. This is a low risk study. The left ventricular ejection fraction is normal (55-65%). __________  2D echo 06/01/2024: 1. Left ventricular ejection fraction, by estimation, is 50 to 55%. Left  ventricular ejection fraction by 3D volume is 50 %. The left ventricle has  low normal function. The left ventricle has no regional wall motion  abnormalities. Indeterminate  diastolic filling due to E-A fusion.   2. Right ventricular systolic function is normal. The right ventricular  size is normal. Tricuspid regurgitation signal is inadequate for assessing  PA pressure.   3. The mitral valve is normal in structure. No evidence of mitral valve  regurgitation. No evidence of  mitral stenosis.   4. The aortic valve is tricuspid. Aortic valve regurgitation is not  visualized. No aortic stenosis is present.   5. The inferior vena cava is normal in size with greater than 50%  respiratory variability, suggesting right atrial pressure of 3 mmHg.     EKG:  EKG is ordered today.  The EKG ordered today demonstrates NSR, 79 bpm, no acute ST-T changes  Recent Labs: 09/09/2023: Magnesium 1.7 06/01/2024:  TSH 5.450 06/13/2024: ALT 28 06/15/2024: BUN 36; Creatinine, Ser 2.18; Hemoglobin 9.9; Platelets 335; Potassium 4.9; Pro Brain Natriuretic Peptide 711.0; Sodium 142  Recent Lipid Panel    Component Value Date/Time   CHOL 203 (H) 01/19/2022 1206   TRIG 196 (H) 01/19/2022 1206   HDL 58 01/19/2022 1206   CHOLHDL 3.5 01/19/2022 1206   VLDL 16 11/06/2018 0111   LDLCALC 113 (H) 01/19/2022 1206    PHYSICAL EXAM:    VS:  BP (!) 147/90 (BP Location: Left Arm, Patient Position: Sitting, Cuff Size: Normal)   Pulse 79 Comment: 90 oximeter  Ht 5' 5 (1.651 m)   Wt 152 lb 9.6 oz (69.2 kg)   SpO2 98%   BMI 25.39 kg/m   BMI: Body mass index is 25.39 kg/m.  Physical Exam Constitutional:      Appearance: He is well-developed.  HENT:     Head: Normocephalic and atraumatic.  Eyes:     General:        Right eye: No discharge.        Left eye: No discharge.  Neck:     Vascular: No JVD.  Cardiovascular:     Rate and Rhythm: Normal rate and regular rhythm.     Heart sounds: Normal heart sounds, S1 normal and S2 normal. Heart sounds not distant. No midsystolic click and no opening snap. No murmur heard.    No friction rub.  Pulmonary:     Effort: Pulmonary effort is normal. No respiratory distress.     Breath sounds: Normal breath sounds. No decreased breath sounds, wheezing, rhonchi or rales.  Musculoskeletal:     Cervical back: Normal range of motion.     Right lower leg: Edema present.     Left lower leg: Edema present.     Comments: Mild bilateral nonpitting lower  extremity swelling to the mid shin.  Skin:    General: Skin is warm and dry.     Nails: There is no clubbing.  Neurological:     Mental Status: He is alert and oriented to person, place, and time.  Psychiatric:        Speech: Speech normal.        Behavior: Behavior normal.        Thought Content: Thought content normal.        Judgment: Judgment normal.     Wt Readings from Last 3 Encounters:  06/21/24 152 lb 9.6 oz (69.2 kg)  06/21/24 149 lb (67.6 kg)  06/15/24 138 lb 14.2 oz (63 kg)     ASSESSMENT & PLAN:   Elevated troponin: The patient has a history of intermittent elevated troponin dating back to 2020, with stress test showing no evidence of ischemia and overall low risk at that time.  Recently admitted to the hospital in the setting of hypertensive emergency complicated by acute on CKD stage III with elevated troponin felt to be related to supply/demand ischemia.  Echo with low normal LV systolic function and normal wall motion.  Not a great candidate for coronary CTA with underlying renal dysfunction.  Plan to pursue stress MRI to evaluate for ischemic heart disease as well as infiltrative cardiomyopathy given complex presentation.  Lower extremity swelling: Improved.  Uncertain etiology at this time.  Not consistent with exclusive cardiac presentation.  Query if this is third spacing secondary to hypoalbuminemia and underlying progressive anemia worsened by overdiuresis with AKI.  Recommend leg elevation and compression socks.  Trend CMP, CBC, and proBNP.  Currently  on furosemide  20 mg 2 times per week as needed as directed by nephrology (see ED note on 06/15/2024).  Pursue cardiac MRI as outlined above to evaluate for infiltrative process contributing.  HTN: Blood pressure is mildly elevated in the office today though has been labile at home ranging from 100 mmHg to 180 mmHg systolic.  Currently on carvedilol  25 mg twice daily and hydralazine  50 mg 3 times daily.  Continue to  monitor.  HLD: LDL 96 in 08/2022.  Not currently on statin.  Pending stress test results, consider initiation of statin therapy for further CV risk reduction.  Acute on CKD stage IIIb with acute on chronic anemia of chronic disease: Over the course of 05/2024, his hemoglobin has trended from 12.8 on 05/31/2024 to 9.9 at the ER evaluation on 06/15/2024.  Denies symptoms concerning for bleeding.  Cannot exclude iatrogenic anemia given multiple ED visits and hospital admissions.  Trend CBC today.  If hemoglobin continues to downtrend, this is likely contributing to third spacing/edema.  Remains off lisinopril  with AKI.  Hypoalbuminemia: Most recent albumin low at 2.7.  Likely contributing to third spacing/edema.  Trend CMP.  Etiology unclear at this time.   Informed Consent   Shared Decision Making/Informed Consent{  The risks [chest pain, shortness of breath, cardiac arrhythmias, dizziness, blood pressure fluctuations, myocardial infarction, stroke/transient ischemic attack, nausea, vomiting, allergic reaction, and life-threatening complications (estimated to be 1 in 10,000)], benefits (risk stratification, diagnosing coronary artery disease, treatment guidance) and alternatives of a MRI stress test were discussed in detail with Mr. Duplessis and he agrees to proceed.        Disposition: F/u with Dr. Darron or an APP after stress MRI.   Medication Adjustments/Labs and Tests Ordered: Current medicines are reviewed at length with the patient today.  Concerns regarding medicines are outlined above. Medication changes, Labs and Tests ordered today are summarized above and listed in the Patient Instructions accessible in Encounters.   Bonney Bernardino Bring, PA-C 06/21/2024 5:23 PM     [facility] -  [address] [place], [place] [address] [phone]     [1]  Current Meds  Medication Sig   Blood Pressure Monitoring (OMRON 3 SERIES BP MONITOR) DEVI 1 each by Does  not apply route daily.   carvedilol  (COREG ) 25 MG tablet Take 1 tablet (25 mg total) by mouth 2 (two) times daily with a meal.   furosemide  (LASIX ) 20 MG tablet Take 1 tablet (20 mg total) by mouth 2 (two) times a week. As needed for sudden onset swelling   hydrALAZINE  (APRESOLINE ) 50 MG tablet Take 1 tablet (50 mg total) by mouth every 8 (eight) hours.   hydrOXYzine  (VISTARIL ) 25 MG capsule Take 1 capsule (25 mg total) by mouth every 8 (eight) hours as needed for anxiety.   NOVOLOG  MIX 70/30 FLEXPEN (70-30) 100 UNIT/ML FlexPen Inject 24-34 Units into the skin 2 (two) times daily with a meal. 34 UNITS WITH BREAKFAST, 24 WITH SUPPER. MAX DAILY DOSE 55 UNITS   pantoprazole  (PROTONIX ) 40 MG tablet Take 1 tablet (40 mg total) by mouth daily.   "

## 2024-06-21 ENCOUNTER — Ambulatory Visit (INDEPENDENT_AMBULATORY_CARE_PROVIDER_SITE_OTHER): Admitting: Nurse Practitioner

## 2024-06-21 ENCOUNTER — Encounter: Payer: Self-pay | Admitting: Physician Assistant

## 2024-06-21 ENCOUNTER — Encounter: Payer: Self-pay | Admitting: Nurse Practitioner

## 2024-06-21 ENCOUNTER — Telehealth: Payer: Self-pay | Admitting: Emergency Medicine

## 2024-06-21 ENCOUNTER — Ambulatory Visit: Admitting: Physician Assistant

## 2024-06-21 ENCOUNTER — Other Ambulatory Visit
Admission: RE | Admit: 2024-06-21 | Discharge: 2024-06-21 | Disposition: A | Discharge status: Home / Self Care | Source: Ambulatory Visit | Attending: Physician Assistant | Admitting: Physician Assistant

## 2024-06-21 VITALS — BP 147/90 | HR 79 | Ht 65.0 in | Wt 152.6 lb

## 2024-06-21 VITALS — BP 130/92 | HR 78 | Temp 97.0°F | Ht 63.0 in | Wt 149.0 lb

## 2024-06-21 DIAGNOSIS — F79 Unspecified intellectual disabilities: Secondary | ICD-10-CM | POA: Diagnosis not present

## 2024-06-21 DIAGNOSIS — I10 Essential (primary) hypertension: Secondary | ICD-10-CM | POA: Insufficient documentation

## 2024-06-21 DIAGNOSIS — E8809 Other disorders of plasma-protein metabolism, not elsewhere classified: Secondary | ICD-10-CM | POA: Insufficient documentation

## 2024-06-21 DIAGNOSIS — N179 Acute kidney failure, unspecified: Secondary | ICD-10-CM | POA: Insufficient documentation

## 2024-06-21 DIAGNOSIS — D649 Anemia, unspecified: Secondary | ICD-10-CM | POA: Insufficient documentation

## 2024-06-21 DIAGNOSIS — E1043 Type 1 diabetes mellitus with diabetic autonomic (poly)neuropathy: Secondary | ICD-10-CM | POA: Diagnosis not present

## 2024-06-21 DIAGNOSIS — R7989 Other specified abnormal findings of blood chemistry: Secondary | ICD-10-CM | POA: Insufficient documentation

## 2024-06-21 DIAGNOSIS — D638 Anemia in other chronic diseases classified elsewhere: Secondary | ICD-10-CM | POA: Insufficient documentation

## 2024-06-21 DIAGNOSIS — Z79899 Other long term (current) drug therapy: Secondary | ICD-10-CM | POA: Insufficient documentation

## 2024-06-21 DIAGNOSIS — E782 Mixed hyperlipidemia: Secondary | ICD-10-CM | POA: Insufficient documentation

## 2024-06-21 DIAGNOSIS — E063 Autoimmune thyroiditis: Secondary | ICD-10-CM

## 2024-06-21 DIAGNOSIS — H543 Unqualified visual loss, both eyes: Secondary | ICD-10-CM | POA: Diagnosis not present

## 2024-06-21 DIAGNOSIS — N183 Chronic kidney disease, stage 3 unspecified: Secondary | ICD-10-CM | POA: Insufficient documentation

## 2024-06-21 DIAGNOSIS — D631 Anemia in chronic kidney disease: Secondary | ICD-10-CM | POA: Insufficient documentation

## 2024-06-21 DIAGNOSIS — R809 Proteinuria, unspecified: Secondary | ICD-10-CM | POA: Diagnosis not present

## 2024-06-21 DIAGNOSIS — F419 Anxiety disorder, unspecified: Secondary | ICD-10-CM

## 2024-06-21 DIAGNOSIS — M7989 Other specified soft tissue disorders: Secondary | ICD-10-CM | POA: Diagnosis present

## 2024-06-21 DIAGNOSIS — I5032 Chronic diastolic (congestive) heart failure: Secondary | ICD-10-CM

## 2024-06-21 DIAGNOSIS — E1065 Type 1 diabetes mellitus with hyperglycemia: Secondary | ICD-10-CM

## 2024-06-21 DIAGNOSIS — I129 Hypertensive chronic kidney disease with stage 1 through stage 4 chronic kidney disease, or unspecified chronic kidney disease: Secondary | ICD-10-CM | POA: Diagnosis not present

## 2024-06-21 DIAGNOSIS — I249 Acute ischemic heart disease, unspecified: Secondary | ICD-10-CM

## 2024-06-21 LAB — CBC
HCT: 33.1 % — ABNORMAL LOW (ref 39.0–52.0)
Hemoglobin: 10.7 g/dL — ABNORMAL LOW (ref 13.0–17.0)
MCH: 31.1 pg (ref 26.0–34.0)
MCHC: 32.3 g/dL (ref 30.0–36.0)
MCV: 96.2 fL (ref 80.0–100.0)
Platelets: 318 10*3/uL (ref 150–400)
RBC: 3.44 MIL/uL — ABNORMAL LOW (ref 4.22–5.81)
RDW: 13.3 % (ref 11.5–15.5)
WBC: 6.2 10*3/uL (ref 4.0–10.5)
nRBC: 0 % (ref 0.0–0.2)

## 2024-06-21 LAB — COMPREHENSIVE METABOLIC PANEL WITH GFR
ALT: 21 U/L (ref 0–44)
AST: 22 U/L (ref 15–41)
Albumin: 3.1 g/dL — ABNORMAL LOW (ref 3.5–5.0)
Alkaline Phosphatase: 90 U/L (ref 38–126)
Anion gap: 8 (ref 5–15)
BUN: 35 mg/dL — ABNORMAL HIGH (ref 6–20)
CO2: 25 mmol/L (ref 22–32)
Calcium: 8.9 mg/dL (ref 8.9–10.3)
Chloride: 110 mmol/L (ref 98–111)
Creatinine, Ser: 2.04 mg/dL — ABNORMAL HIGH (ref 0.61–1.24)
GFR, Estimated: 43 mL/min — ABNORMAL LOW
Glucose, Bld: 79 mg/dL (ref 70–99)
Potassium: 5.5 mmol/L — ABNORMAL HIGH (ref 3.5–5.1)
Sodium: 142 mmol/L (ref 135–145)
Total Bilirubin: <0.2 mg/dL (ref 0.0–1.2)
Total Protein: 6 g/dL — ABNORMAL LOW (ref 6.5–8.1)

## 2024-06-21 LAB — PRO BRAIN NATRIURETIC PEPTIDE: Pro Brain Natriuretic Peptide: 999 pg/mL — ABNORMAL HIGH

## 2024-06-21 MED ORDER — HYDROXYZINE PAMOATE 25 MG PO CAPS: 25.0000 mg | ORAL_CAPSULE | Freq: Three times a day (TID) | ORAL | 1 refills | Status: AC | PRN

## 2024-06-21 NOTE — Patient Instructions (Addendum)
 Medication Instructions:  Your physician recommends that you continue on your current medications as directed. Please refer to the Current Medication list given to you today.   *If you need a refill on your cardiac medications before your next appointment, please call your pharmacy*  Lab Work: Your provider would like for you to have following labs drawn today Your provider would like for you to have following labs drawn today ProBNP, CMeT, and CBC.   If you have labs (blood work) drawn today and your tests are completely normal, you will receive your results only by: MyChart Message (if you have MyChart) OR A paper copy in the mail If you have any lab test that is abnormal or we need to change your treatment, we will call you to review the results.  Testing/Procedures:   You are scheduled for Cardiac Stress MRI at the location below.  Please arrive for your appointment at ______________ . ?  [facility] [address] [place], [place] [address] Please take advantage of the free valet parking available at the [facility] and [employer] (Entrance C).  Proceed to the [facility] Radiology Department (First Floor) for check-in.   OR   [facility] [address] [place], [place] [address] Please go to the [facility] and check-in with the desk attendant.   Magnetic resonance imaging (MRI) is a painless test that produces images of the inside of the body without using Xrays.  During an MRI, strong magnets and radio waves work together in a data processing manager to form detailed images.   MRI images may provide more details about a medical condition than X-rays, CT scans, and ultrasounds can provide.  You may be given earphones to listen for instructions.  You may eat a light breakfast and take medications as ordered with the exception of furosemide , hydrochlorothiazide, chlorthalidone or spironolactone (or any other fluid pill). If you are  undergoing a stress MRI, please avoid stimulants for 12 hr prior to test. (I.e. Caffeine, nicotine, chocolate, or antihistamine medications)  If your provider has ordered anti-anxiety medications for this test, then you will need a driver.  An IV will be inserted into one of your veins. Contrast material will be injected into your IV. It will leave your body through your urine within a day. You may be told to drink plenty of fluids to help flush the contrast material out of your system.  You will be asked to remove all metal, including: Watch, jewelry, and other metal objects including hearing aids, hair pieces and dentures. Also wearable glucose monitoring systems (ie. Freestyle Libre and Omnipods) (Braces and fillings normally are not a problem.)   TEST WILL TAKE APPROXIMATELY 1 HOUR  PLEASE NOTIFY SCHEDULING AT LEAST 24 HOURS IN ADVANCE IF YOU ARE UNABLE TO KEEP YOUR APPOINTMENT. [phone]  For more information and frequently asked questions, please visit our website : [email or web address]  Please call the Cardiac Imaging Nurse Navigators with any questions/concerns. [phone] Office    Follow-Up: At [facility], you and your health needs are our priority.  As part of our continuing mission to provide you with exceptional heart care, our providers are all part of one team.  This team includes your primary Cardiologist (physician) and Advanced Practice Providers or APPs (Physician Assistants and Nurse Practitioners) who all work together to provide you with the care you need, when you need it.  Your next appointment:   2 month(s)  Provider:   You may see  Deatrice Cage, MD or Bernardino Bring, PA-C   We recommend signing up for the patient portal called MyChart.  Sign up information is provided on this After Visit Summary.  MyChart is used to connect with patients for Virtual Visits (Telemedicine).  Patients are able to view lab/test results, encounter notes,  upcoming appointments, etc.  Non-urgent messages can be sent to your provider as well.   To learn more about what you can do with MyChart, go to [email or web address].

## 2024-06-21 NOTE — Progress Notes (Signed)
 "  BP (!) 130/92   Pulse 78   Temp (!) 97 F (36.1 C)   Ht 5' 3 (1.6 m)   Wt 149 lb (67.6 kg)   SpO2 98%   BMI 26.39 kg/m    Subjective:    Patient ID: [name], male    DOB: [birth year], 39 y.o.   MRN: [id number]  HPI: [name] is a 39 y.o. male  Chief Complaint  Patient presents with   Establish Care   Discussed the use of AI scribe software for clinical note transcription with the patient, who gave verbal consent to proceed.  History of Present Illness [name] is a 39 year old male with type one diabetes, hypothyroidism, congestive heart failure, and hypertension who presents to establish care. He is accompanied by his grandmother.  Hyperglycemia and hypoglycemia management - Type 1 diabetes diagnosed at age 17 - Managed with Novolog  70/30, 45-50 units daily - Adjusts insulin  based on meals: 10-30 units with breakfast, 25 units with supper - Last hemoglobin A1c 12.7% in November 2025 - Experiences hypoglycemic episodes with glucose in the 50s, managed by eating - Legally blind and has cognitive impairment, complicating diabetes management - Family keeps records of blood sugar and weight - Family stressors, such as brother's visits, affect blood sugar levels - Issues with glucose meter company, unresponsive to family requests for assistance  Thyroid  dysfunction - Hypothyroidism with elevated TSH on June 01, 2024; normal T4 - Previously on Synthroid  25 mcg daily, discontinued during recent hospitalization without explanation - Scheduled endocrinology follow-up in February  Congestive heart failure and volume overload - Congestive heart failure, last cardiology visit April 2021 - Last BNP 711 on June 15, 2024 - On carvedilol  25 mg twice daily and hydralazine  50 mg every eight hours - Edema treated with furosemide  in the ER on June 15, 2024  Hypertension and hyperlipidemia - Hypertension, previously on lisinopril  10 mg  daily, discontinued during last hospitalization - was on Lipitor 10 mg daily for hyperlipidemia ( no longer taking) - Family stressors reported to affect blood pressure  Visual and cognitive impairment - Legally blind - Cognitive impairment complicates self-management of chronic conditions         06/21/2024    1:13 PM  Depression screen PHQ 2/9  Decreased Interest 1  Down, Depressed, Hopeless 1  PHQ - 2 Score 2  Altered sleeping 2  Tired, decreased energy 1  Change in appetite 0  Feeling bad or failure about yourself  0  Trouble concentrating 0  Moving slowly or fidgety/restless 0  Suicidal thoughts 0  PHQ-9 Score 5    Relevant past medical, surgical, family and social history reviewed and updated as indicated. Interim medical history since our last visit reviewed. Allergies and medications reviewed and updated.  Review of Systems  Ten systems reviewed and is negative except as mentioned in HPI      Objective:      BP (!) 130/92   Pulse 78   Temp (!) 97 F (36.1 C)   Ht 5' 3 (1.6 m)   Wt 149 lb (67.6 kg)   SpO2 98%   BMI 26.39 kg/m    Wt Readings from Last 3 Encounters:  06/21/24 149 lb (67.6 kg)  06/15/24 138 lb 14.2 oz (63 kg)  06/06/24 139 lb 1.8 oz (63.1 kg)    Physical Exam VITALS: BP- 150/100 GENERAL: Alert, cooperative, well developed, no acute distress HEENT: Normocephalic, normal oropharynx, moist mucous membranes  CHEST: Clear to auscultation bilaterally, no wheezes, rhonchi, or crackles CARDIOVASCULAR: Normal heart rate and rhythm, S1 and S2 normal without murmurs ABDOMEN: Soft, non-tender, non-distended, without organomegaly, normal bowel sounds EXTREMITIES: No cyanosis or edema NEUROLOGICAL: Cranial nerves grossly intact, moves all extremities without gross motor or sensory deficit  Results for orders placed or performed during the hospital encounter of 06/15/24  Basic metabolic panel   Collection Time: 06/15/24 11:57 AM  Result Value Ref  Range   Sodium 142 135 - 145 mmol/L   Potassium 4.9 3.5 - 5.1 mmol/L   Chloride 111 98 - 111 mmol/L   CO2 25 22 - 32 mmol/L   Glucose, Bld 52 (L) 70 - 99 mg/dL   BUN 36 (H) 6 - 20 mg/dL   Creatinine, Ser 7.81 (H) 0.61 - 1.24 mg/dL   Calcium  8.8 (L) 8.9 - 10.3 mg/dL   GFR, Estimated 39 (L) >60 mL/min   Anion gap 6 5 - 15  CBC   Collection Time: 06/15/24 11:57 AM  Result Value Ref Range   WBC 7.4 4.0 - 10.5 K/uL   RBC 3.18 (L) 4.22 - 5.81 MIL/uL   Hemoglobin 9.9 (L) 13.0 - 17.0 g/dL   HCT 68.8 (L) 60.9 - 47.9 %   MCV 97.8 80.0 - 100.0 fL   MCH 31.1 26.0 - 34.0 pg   MCHC 31.8 30.0 - 36.0 g/dL   RDW 86.3 88.4 - 84.4 %   Platelets 335 150 - 400 K/uL   nRBC 0.0 0.0 - 0.2 %  Pro Brain natriuretic peptide   Collection Time: 06/15/24 11:57 AM  Result Value Ref Range   Pro Brain Natriuretic Peptide 711.0 (H) <300.0 pg/mL  CBG monitoring, ED   Collection Time: 06/15/24 12:01 PM  Result Value Ref Range   Glucose-Capillary 50 (L) 70 - 99 mg/dL  CBG monitoring, ED   Collection Time: 06/15/24 12:23 PM  Result Value Ref Range   Glucose-Capillary 76 70 - 99 mg/dL          Assessment & Plan:   Problem List Items Addressed This Visit       Cardiovascular and Mediastinum   Essential hypertension, benign   Heart failure (HCC)   RESOLVED: Acute coronary syndrome (HCC)     Endocrine   Hypothyroidism, acquired, autoimmune   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 Last A1c was 12.7% in November 2025. Currently on  Novolog  with variable dosing based on meals. Issues with insulin  meter company not responding. Blood sugars have been running low since medication changes. - Contact endocrinologist to address insulin  meter company issues. - Ensure routine eating when blood sugars are low. - Continue current insulin  regimen until further guidance from endocrinologist.  Congestive Heart Failure Congestive heart failure with recent edema and elevated BNP of 711 on January 23rd, 2026. Currently on carvedilol  and hydralazine . Recent hospital visit for edema management with furosemide  prescribed twice weekly. No recent cardiology follow-up since April 2021. - Continue  carvedilol  and hydralazine . - Use furosemide  as prescribed for edema. - Follow up with cardiology today.  Hypertension Recent elevated blood pressure reading of 150/100 mmHg. Lisinopril  was discontinued in the hospital without clear reason. Blood pressure management to be adjusted by nephrology. - Continue current antihypertensive regimen until nephrology follow-up.  Chronic Kidney Disease, Stage 3 Chronic kidney disease stage 3 with GFR of 39 on January 23rd, 2026. Lisinopril  was discontinued in the hospital, which is concerning for kidney protection. - Follow up with nephrology tomorrow for medication adjustments and management.  Hypothyroidism Elevated TSH noted on January 9th, 2026, with normal T4. Synthroid  was discontinued in the hospital without clear reason. No recent endocrinology follow-up since January. - Contact endocrinologist to confirm if Synthroid  should be resumed. - Ensure Synthroid  is available at home.  Hyperlipidemia -was Managed with Lipitor 10 mg daily but was discontinued while in the hospital   Anxiety Disorder Anxiety exacerbated by family disturbances, leading to elevated blood pressure and blood sugar fluctuations. - Prescribed hydroxyzine  every 8 hours as needed for anxiety, with caution to avoid confusion with  hydralazine .        Follow up plan: Return in about 3 months (around 09/19/2024) for follow up. "

## 2024-06-21 NOTE — Telephone Encounter (Signed)
 FMLA request by pt given to [place], completed and [name], pt's grandmother called to pick-up originals from front office as requested, copy placed in batch scanning for addition to pt's chart - [name] reports she'll pick-up tomorrow

## 2024-06-22 ENCOUNTER — Ambulatory Visit: Payer: Self-pay | Admitting: Physician Assistant

## 2024-06-22 ENCOUNTER — Other Ambulatory Visit: Payer: Self-pay

## 2024-06-22 ENCOUNTER — Emergency Department
Admission: EM | Admit: 2024-06-22 | Discharge: 2024-06-22 | Disposition: A | Discharge status: Home / Self Care | Attending: Emergency Medicine | Admitting: Emergency Medicine

## 2024-06-22 DIAGNOSIS — I509 Heart failure, unspecified: Secondary | ICD-10-CM | POA: Diagnosis not present

## 2024-06-22 DIAGNOSIS — E10649 Type 1 diabetes mellitus with hypoglycemia without coma: Secondary | ICD-10-CM | POA: Insufficient documentation

## 2024-06-22 DIAGNOSIS — N1832 Chronic kidney disease, stage 3b: Secondary | ICD-10-CM | POA: Insufficient documentation

## 2024-06-22 DIAGNOSIS — E039 Hypothyroidism, unspecified: Secondary | ICD-10-CM | POA: Diagnosis not present

## 2024-06-22 DIAGNOSIS — E162 Hypoglycemia, unspecified: Secondary | ICD-10-CM

## 2024-06-22 DIAGNOSIS — D631 Anemia in chronic kidney disease: Secondary | ICD-10-CM | POA: Insufficient documentation

## 2024-06-22 DIAGNOSIS — E1021 Type 1 diabetes mellitus with diabetic nephropathy: Secondary | ICD-10-CM | POA: Diagnosis not present

## 2024-06-22 DIAGNOSIS — I130 Hypertensive heart and chronic kidney disease with heart failure and stage 1 through stage 4 chronic kidney disease, or unspecified chronic kidney disease: Secondary | ICD-10-CM | POA: Insufficient documentation

## 2024-06-22 LAB — BASIC METABOLIC PANEL WITH GFR
Anion gap: 9 (ref 5–15)
BUN: 33 mg/dL — ABNORMAL HIGH (ref 6–20)
CO2: 20 mmol/L — ABNORMAL LOW (ref 22–32)
Calcium: 8.5 mg/dL — ABNORMAL LOW (ref 8.9–10.3)
Chloride: 109 mmol/L (ref 98–111)
Creatinine, Ser: 2.17 mg/dL — ABNORMAL HIGH (ref 0.61–1.24)
GFR, Estimated: 39 mL/min — ABNORMAL LOW
Glucose, Bld: 76 mg/dL (ref 70–99)
Potassium: 4.7 mmol/L (ref 3.5–5.1)
Sodium: 138 mmol/L (ref 135–145)

## 2024-06-22 LAB — CBC WITH DIFFERENTIAL/PLATELET
Abs Immature Granulocytes: 0.02 10*3/uL (ref 0.00–0.07)
Basophils Absolute: 0 10*3/uL (ref 0.0–0.1)
Basophils Relative: 1 %
Eosinophils Absolute: 0.2 10*3/uL (ref 0.0–0.5)
Eosinophils Relative: 3 %
HCT: 33.1 % — ABNORMAL LOW (ref 39.0–52.0)
Hemoglobin: 10.4 g/dL — ABNORMAL LOW (ref 13.0–17.0)
Immature Granulocytes: 0 %
Lymphocytes Relative: 24 %
Lymphs Abs: 1.2 10*3/uL (ref 0.7–4.0)
MCH: 30.9 pg (ref 26.0–34.0)
MCHC: 31.4 g/dL (ref 30.0–36.0)
MCV: 98.2 fL (ref 80.0–100.0)
Monocytes Absolute: 0.6 10*3/uL (ref 0.1–1.0)
Monocytes Relative: 12 %
Neutro Abs: 3 10*3/uL (ref 1.7–7.7)
Neutrophils Relative %: 60 %
Platelets: 311 10*3/uL (ref 150–400)
RBC: 3.37 MIL/uL — ABNORMAL LOW (ref 4.22–5.81)
RDW: 13.1 % (ref 11.5–15.5)
WBC: 5 10*3/uL (ref 4.0–10.5)
nRBC: 0 % (ref 0.0–0.2)

## 2024-06-22 LAB — CBG MONITORING, ED
Glucose-Capillary: 61 mg/dL — ABNORMAL LOW (ref 70–99)
Glucose-Capillary: 79 mg/dL (ref 70–99)

## 2024-06-22 MED ORDER — GLUCOSE 40 % PO GEL
1.0000 | Freq: Once | ORAL | Status: AC
  Administered 2024-06-22: 31 g via ORAL
  Filled 2024-06-22: qty 1

## 2024-06-22 NOTE — ED Provider Notes (Signed)
 "  [facility] Provider Note    Event Date/Time   First MD Initiated Contact with Patient 06/22/24 1332     (approximate)   History   Hypoglycemia   HPI  [name] is a 39 y.o. 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  Patient had an early appointment with his kidney doctor this morning.  He reports that he had a little bit to eat for breakfast.  They were on the way home and planning to get something to eat when they stopped for some paperwork at the hospital.  Patient appeared to be very lethargic and out of it.  He took 4 glucose tablets and when they went to check his blood sugar with his meter it was not working.  Since they were so close to the hospital they came to the ER to get his blood sugar checked.  At the time of my evaluation patient is feeling back to normal.  Patient denies recent illness, fevers, nausea, vomiting and diarrhea.     Physical Exam   Triage Vital Signs: ED Triage Vitals [06/22/24 1218]  Encounter Vitals Group     BP 131/86     Girls Systolic BP Percentile      Girls Diastolic BP Percentile      Boys Systolic BP Percentile      Boys Diastolic BP Percentile      Pulse Rate 80     Resp 17     Temp 98.3 F (36.8 C)     Temp Source Oral     SpO2 96 %     Weight 148 lb (67.1 kg)     Height 5' 5 (1.651 m)     Head Circumference      Peak Flow      Pain Score 0     Pain Loc      Pain Education      Exclude from Growth Chart     Most recent vital signs: Vitals:   06/22/24 1218  BP: 131/86  Pulse: 80  Resp: 17  Temp: 98.3 F (36.8 C)  SpO2: 96%   General: Awake, no distress.  CV:  Good peripheral perfusion.  RRR. Resp:  Normal effort.  CTAB. Abd:  No distention.   Other:     ED Results / Procedures / Treatments   Labs (all labs ordered are listed, but only abnormal results are displayed) Labs Reviewed  BASIC METABOLIC PANEL WITH GFR - Abnormal; Notable for the following components:      Result Value   CO2 20 (*)    BUN 33 (*)    Creatinine, Ser 2.17 (*)    Calcium  8.5 (*)    GFR, Estimated 39 (*)    All other components within normal limits  CBC WITH DIFFERENTIAL/PLATELET - Abnormal; Notable for the following components:   RBC 3.37 (*)    Hemoglobin 10.4 (*)    HCT 33.1 (*)    All other components within normal limits  CBG MONITORING, ED - Abnormal; Notable for the following components:   Glucose-Capillary 61 (*)    All other components within normal limits  CBG MONITORING, ED      PROCEDURES:  Critical Care performed: No  Procedures   MEDICATIONS ORDERED IN ED: Medications  dextrose  (GLUTOSE) oral gel 40% (peds >  20kg and adults) (31 g Oral Given 06/22/24 1221)     IMPRESSION / MDM / ASSESSMENT AND PLAN / ED COURSE  I reviewed the triage vital signs and the nursing notes.                             39 year old male presents for evaluation of hypoglycemia.  Vital signs are stable patient NAD on exam.  Differential diagnosis includes, but is not limited to, hypoglycemia due to missed meal, hypoglycemia due to medication error, hypoglycemia due to illness, hyperglycemia due to organ failure.  Patient's presentation is most consistent with acute complicated illness / injury requiring diagnostic workup.  I reviewed patient's chart and the note from his PCP from the visit yesterday.  Patient reported blood sugars have been running low since recent medication changes.  Patient was also having issues with the insulin  meter company not responding. Current plan is to wait for further guidance from endocrinologist and continue current insulin  regimen.  Provider emphasized routine eating when blood sugars are low.  Based on  patient's report of the events it sounds like the hypoglycemia was due to a missed meal.  Patient had not given himself any insulin  prior to the onset of the symptoms.  Patient uses a NovoLog  Mix 70/30 FlexPen.  Patient denied any recent illness so I do not believe this is secondary to infection.  Patient's labs are consistent with his baseline so do not suspect worsening kidney or liver failure as a cause.  Patient has returned to baseline.  His blood sugar was 61 upon arrival and increased to 76 by the time of blood draw. Will obtain another CBG. Discussed eating regular meals and following up with patient's endocrinologist as advised by his primary care provider.  Patient voiced understanding, all questions were answered and he was stable at discharge.  Clinical Course as of 06/22/24 1555  Fri Jun 22, 2024  1343 CBG monitoring, ED(!) Hypoglycemic upon arrival to the ED. [LD]  1343 CBC with Differential(!) Mild anemia consistent with patient's baseline. [LD]  1343 Basic metabolic panel(!) Elevated creatinine consistent with patient's baseline. [LD]  1538 CBG monitoring, ED Blood sugar remains stable while in the ED. Patient stable for discharge. [LD]    Clinical Course User Index [LD] Cleaster Tinnie LABOR, PA-C     FINAL CLINICAL IMPRESSION(S) / ED DIAGNOSES   Final diagnoses:  Hypoglycemia     Rx / DC Orders   ED Discharge Orders     None        Note:  This document was prepared using Dragon voice recognition software and may include unintentional dictation errors.   Cleaster Tinnie LABOR, PA-C 06/22/24 1555    Suzanne Kirsch, MD 06/22/24 1853  "

## 2024-06-22 NOTE — ED Triage Notes (Addendum)
 Patient to ED via POV from home. Patient states he came to the ED due to hypoglycemia. Patient states he experienced weakness and felt hot. Patient does have Hx of T1D. CBG on arrival is 43. Patient took (4) 4g carb glucose tablets PTA.

## 2024-06-22 NOTE — Discharge Instructions (Addendum)
 I believe your blood sugar dropped today because you did not eat another meal in time.  Please make sure you are eating regularly.  I do highly encourage you to follow-up with your endocrinologist given the concerns surrounding the blood sugar meter.  You can follow-up with your primary care provider in a couple days as well.  Return to the ED with any worsening symptoms.

## 2024-06-22 NOTE — ED Notes (Signed)
 This RN informed MD Funke of CBG, orange juice and graham crackers given

## 2024-06-26 ENCOUNTER — Telehealth: Payer: Self-pay | Admitting: Cardiovascular Disease

## 2024-07-18 ENCOUNTER — Ambulatory Visit (HOSPITAL_COMMUNITY)

## 2024-08-28 ENCOUNTER — Ambulatory Visit: Admitting: Physician Assistant

## 2024-09-20 ENCOUNTER — Ambulatory Visit: Admitting: Nurse Practitioner
